# Patient Record
Sex: Female | Born: 1985 | Race: White | Hispanic: No | Marital: Married | Smoking: Current every day smoker
Health system: Southern US, Community
[De-identification: ages and names within clinical notes are randomized; demographics above are authoritative.]

## PROBLEM LIST (undated history)

## (undated) DIAGNOSIS — J45909 Unspecified asthma, uncomplicated: Secondary | ICD-10-CM

## (undated) DIAGNOSIS — G709 Myoneural disorder, unspecified: Secondary | ICD-10-CM

## (undated) DIAGNOSIS — F419 Anxiety disorder, unspecified: Secondary | ICD-10-CM

## (undated) DIAGNOSIS — R112 Nausea with vomiting, unspecified: Secondary | ICD-10-CM

## (undated) DIAGNOSIS — F319 Bipolar disorder, unspecified: Secondary | ICD-10-CM

## (undated) DIAGNOSIS — I10 Essential (primary) hypertension: Secondary | ICD-10-CM

## (undated) DIAGNOSIS — F32A Depression, unspecified: Secondary | ICD-10-CM

## (undated) DIAGNOSIS — M1990 Unspecified osteoarthritis, unspecified site: Secondary | ICD-10-CM

## (undated) DIAGNOSIS — R519 Headache, unspecified: Secondary | ICD-10-CM

## (undated) DIAGNOSIS — Z9889 Other specified postprocedural states: Secondary | ICD-10-CM

## (undated) DIAGNOSIS — R7303 Prediabetes: Secondary | ICD-10-CM

## (undated) DIAGNOSIS — K219 Gastro-esophageal reflux disease without esophagitis: Secondary | ICD-10-CM

## (undated) DIAGNOSIS — R0600 Dyspnea, unspecified: Secondary | ICD-10-CM

## (undated) DIAGNOSIS — F329 Major depressive disorder, single episode, unspecified: Secondary | ICD-10-CM

## (undated) HISTORY — PX: CHOLECYSTECTOMY: SHX55

---
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
## 2006-01-18 ENCOUNTER — Ambulatory Visit (HOSPITAL_COMMUNITY): Admission: AD | Admit: 2006-01-18 | Discharge: 2006-01-18 | Payer: Self-pay | Admitting: Obstetrics and Gynecology

## 2006-03-28 ENCOUNTER — Emergency Department (HOSPITAL_COMMUNITY): Admission: EM | Admit: 2006-03-28 | Discharge: 2006-03-29 | Payer: Self-pay | Admitting: Emergency Medicine

## 2006-06-22 ENCOUNTER — Emergency Department (HOSPITAL_COMMUNITY): Admission: EM | Admit: 2006-06-22 | Discharge: 2006-06-22 | Payer: Self-pay | Admitting: Emergency Medicine

## 2006-09-05 ENCOUNTER — Emergency Department (HOSPITAL_COMMUNITY): Admission: EM | Admit: 2006-09-05 | Discharge: 2006-09-05 | Payer: Self-pay | Admitting: Emergency Medicine

## 2006-09-05 IMAGING — CR DG ABDOMEN 1V
2 series · 2 of 2 positions shown · non-contrast
Comparison: none

CLINICAL DATA: 20-year-old with constipation, abdominal pain.  
 ABDOMEN - 1 VIEW:

[view not recorded (1 of 2)]
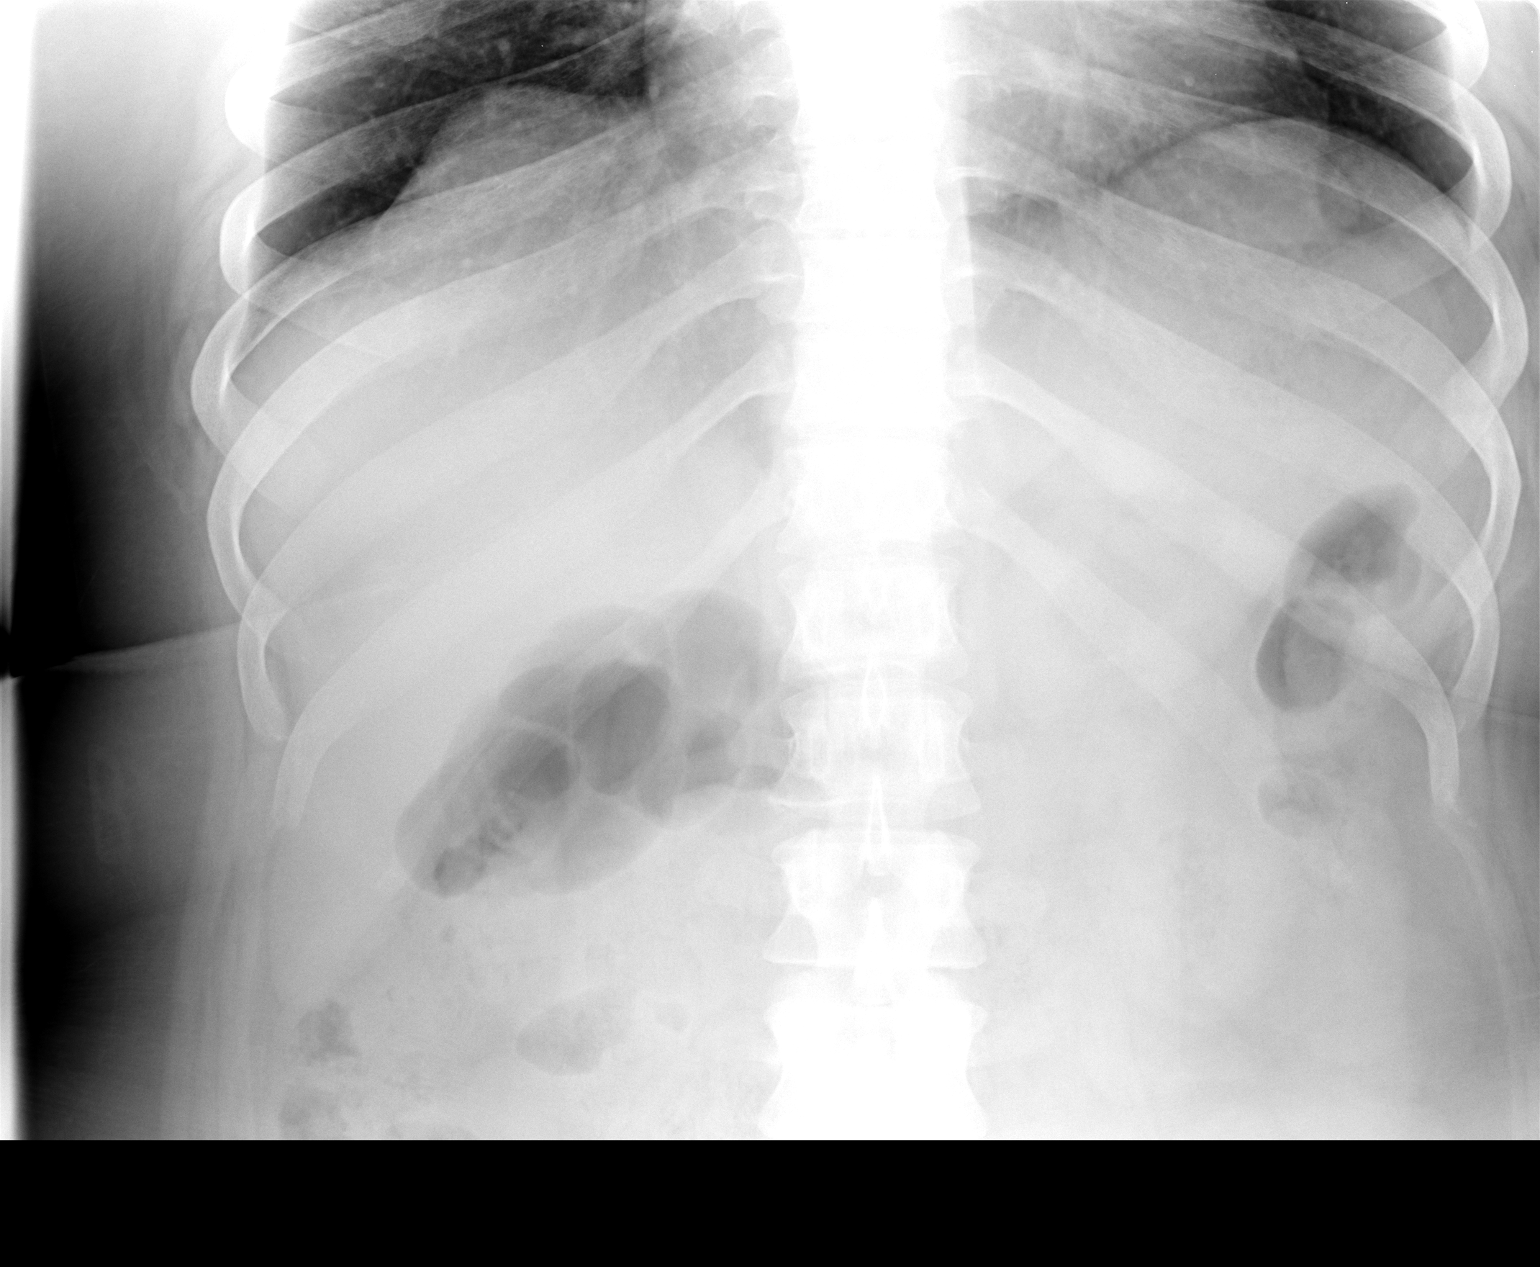

[view not recorded (2 of 2)]
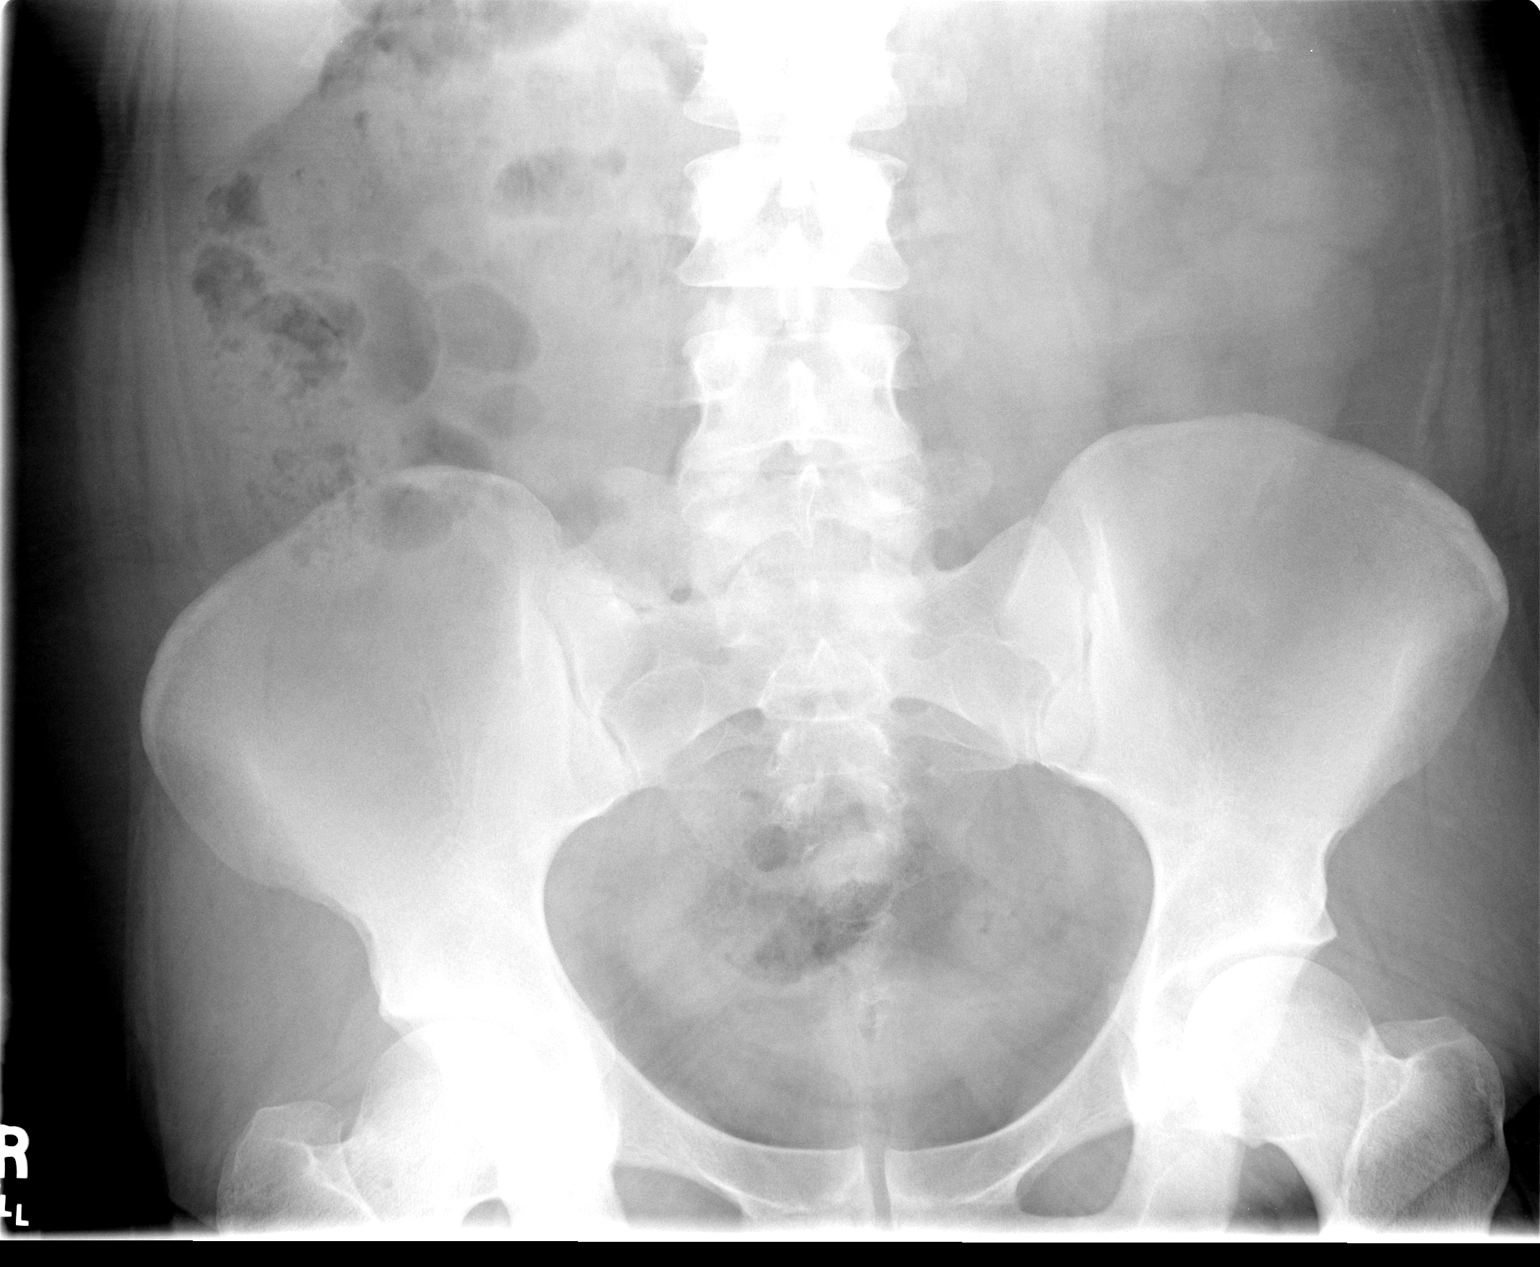

[2 of 2 positions shown; findings below may reference images not displayed]

FINDINGS: There is moderate stool within the ascending colon.  No evidence for bowel loop dilatation however.  No free intraperitoneal air is seen on this view.  Position is supine however.
IMPRESSION: Moderate stool within the ascending colon.

## 2007-02-13 ENCOUNTER — Emergency Department (HOSPITAL_COMMUNITY): Admission: EM | Admit: 2007-02-13 | Discharge: 2007-02-13 | Payer: Self-pay | Admitting: Emergency Medicine

## 2007-09-01 ENCOUNTER — Other Ambulatory Visit: Admission: RE | Admit: 2007-09-01 | Discharge: 2007-09-01 | Payer: Self-pay | Admitting: Obstetrics and Gynecology

## 2007-09-20 ENCOUNTER — Ambulatory Visit (HOSPITAL_COMMUNITY): Admission: RE | Admit: 2007-09-20 | Discharge: 2007-09-20 | Payer: Self-pay | Admitting: Family Medicine

## 2007-09-20 IMAGING — CR DG LUMBAR SPINE COMPLETE 4+V
5 series · 5 of 5 positions shown · non-contrast
Comparison: none

CLINICAL DATA: Low back and right hip pain for two months. 
 DIAGNOSTIC LUMBAR SPINE ? 4 VIEWS:
 No comparison.

[view not recorded (1 of 5)]
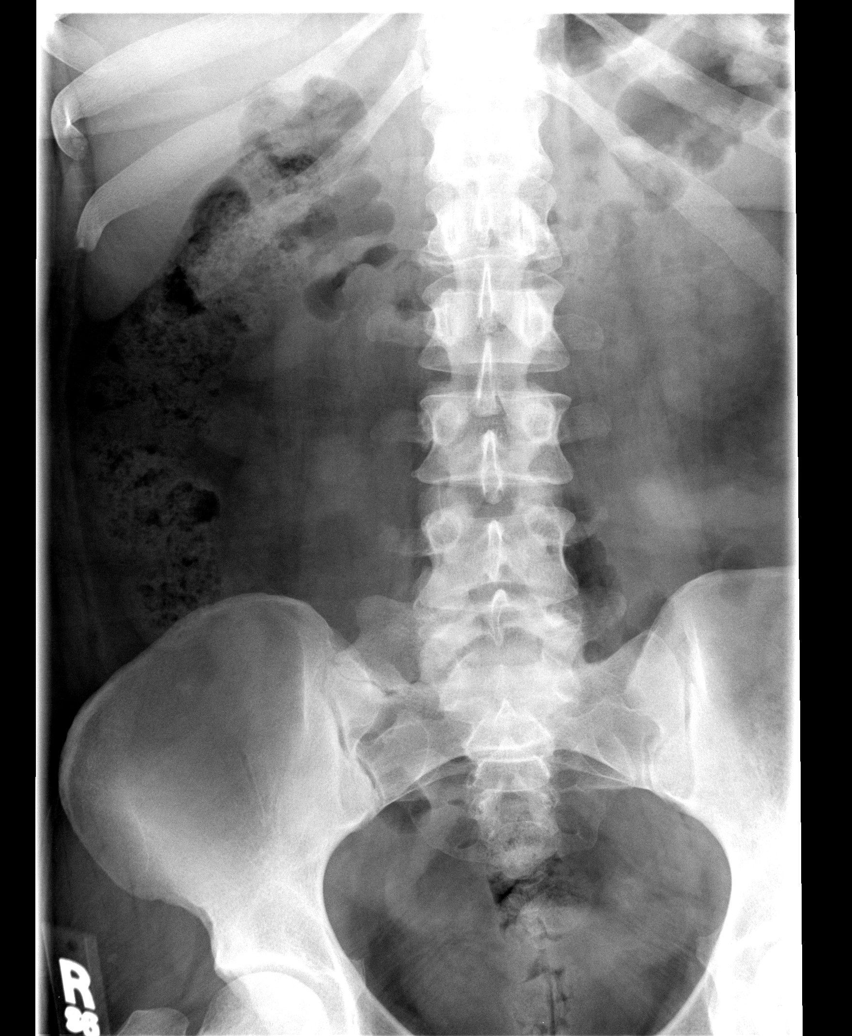

[view not recorded (2 of 5)]
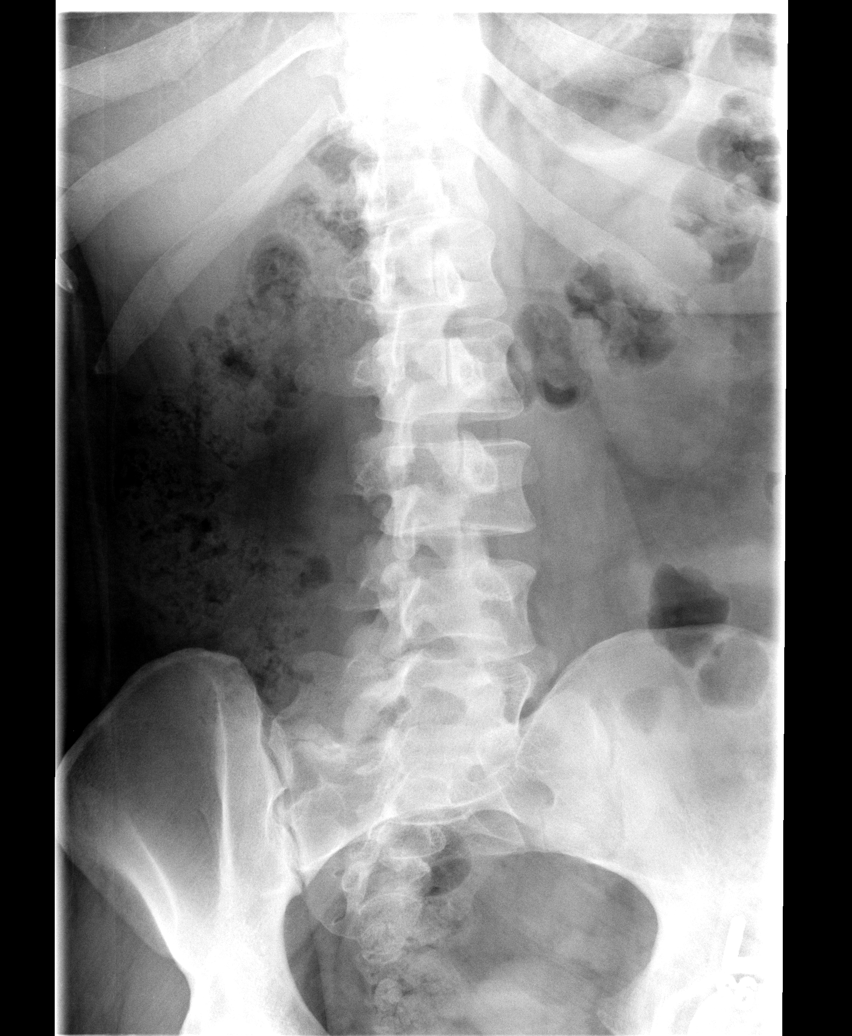

[view not recorded (3 of 5)]
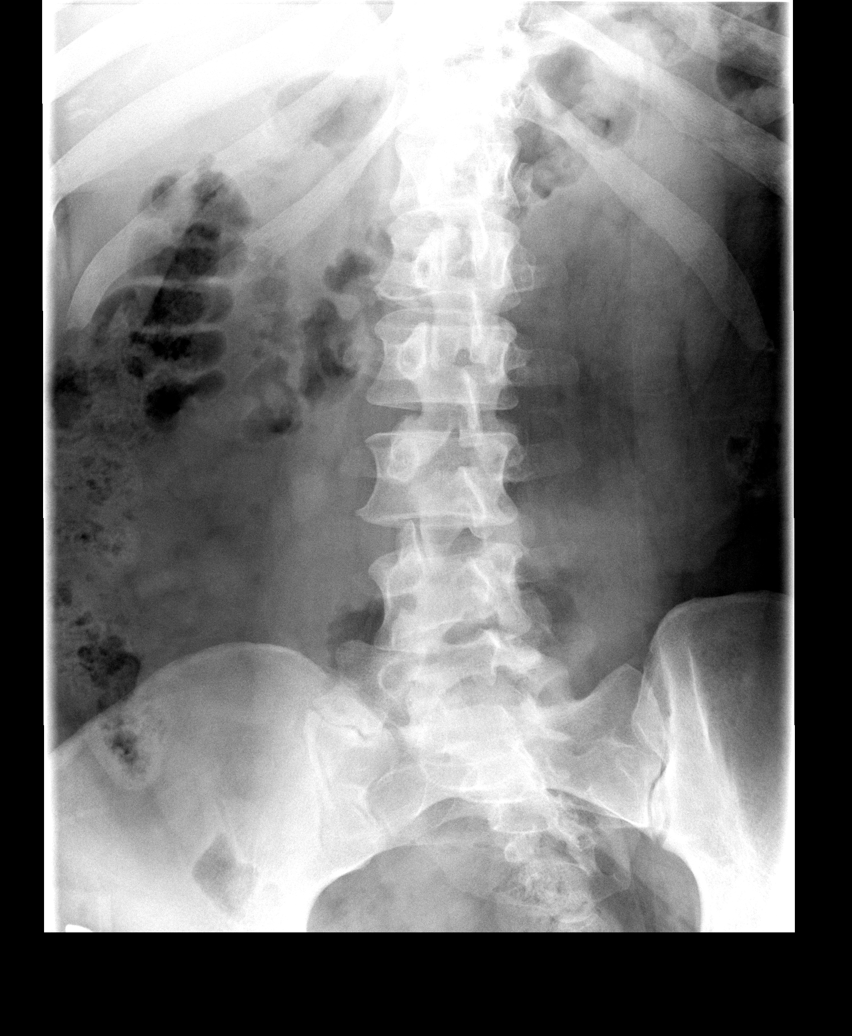

[view not recorded (4 of 5)]
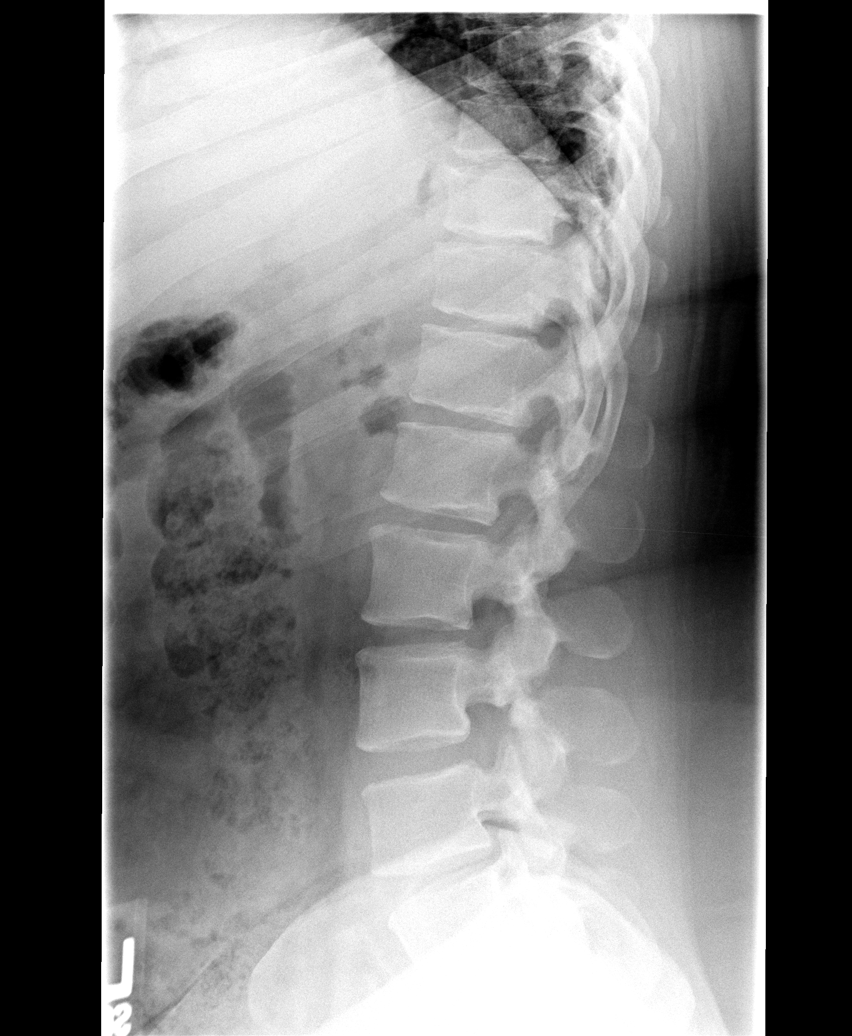

[view not recorded (5 of 5)]
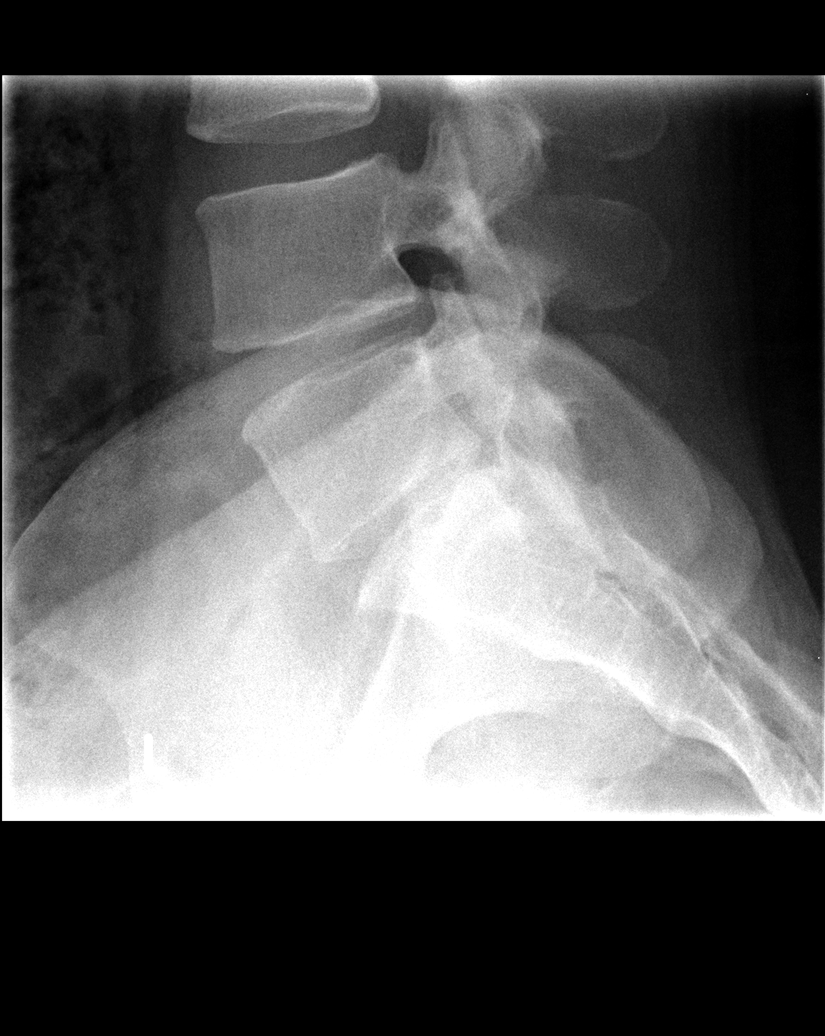

[5 of 5 positions shown; findings below may reference images not displayed]

FINDINGS: There is transitional lumbosacral anatomy. The S1 segment is partially lumbarized with a right-sided S1-S2 assimilation joint.  The alignment is normal.  The disk spaces are preserved.  No pars defect or significant facet disease is seen.
IMPRESSION: 1.  Transitional lumbosacral anatomy with right-sided sacral assimilation joint. This may contribute to right-sided back pain.
 2.  Normal alignment.  No acute osseous findings.

## 2008-05-09 ENCOUNTER — Ambulatory Visit (HOSPITAL_COMMUNITY): Admission: RE | Admit: 2008-05-09 | Discharge: 2008-05-09 | Payer: Self-pay | Admitting: Obstetrics & Gynecology

## 2008-05-09 IMAGING — US US OB DETAIL+14 WK
1 series · 14 of 28 positions shown · non-contrast
Comparison: none

OBSTETRICAL ULTRASOUND:
 This ultrasound was performed in The [HOSPITAL], and the [facility] report will be stored to [REDACTED] PACS.

[Series 1: us ob detail+14 wk · 14 of 136 slices shown]
[im 6/136]
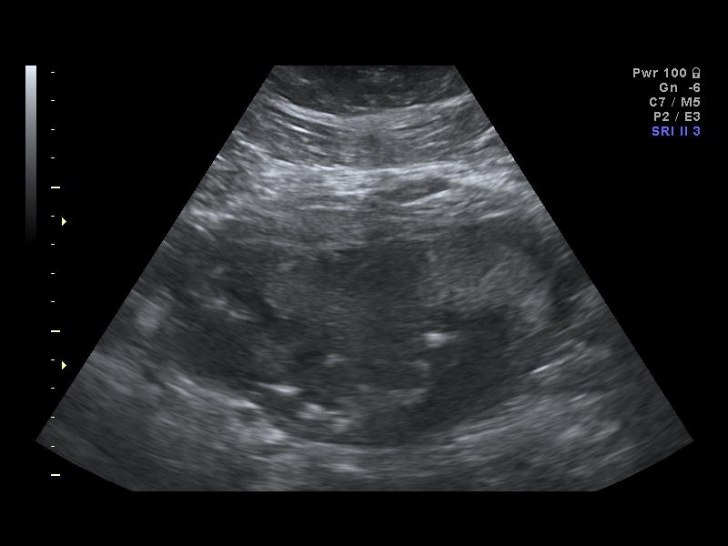
[im 16/136]
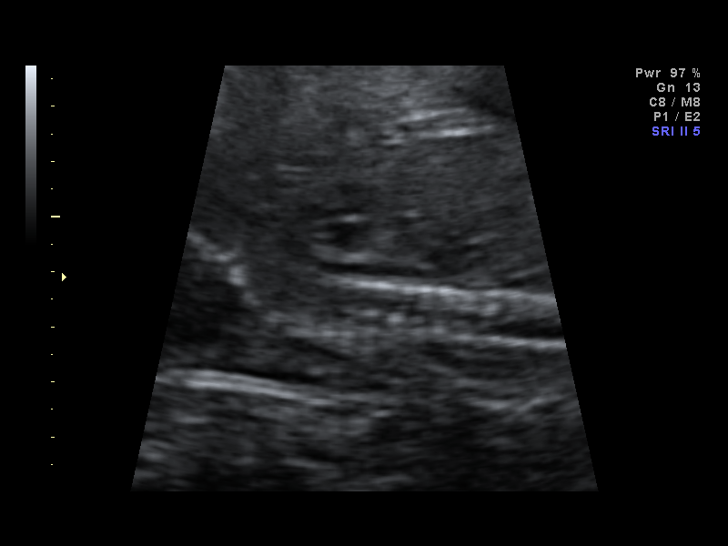
[im 26/136]
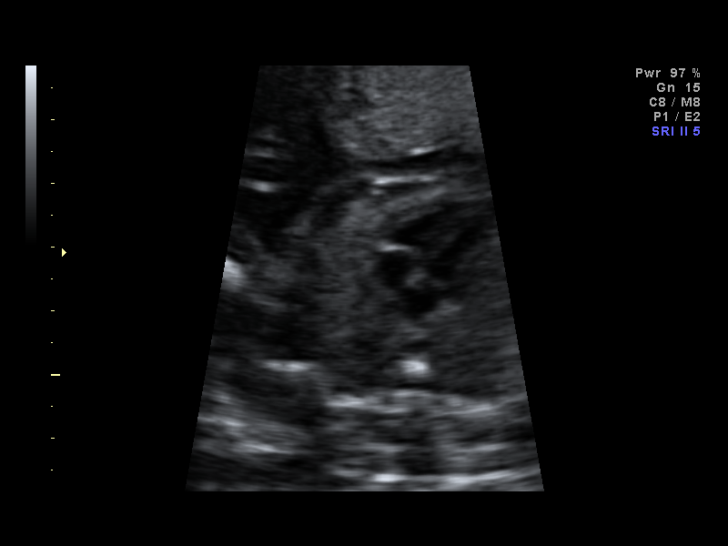
[im 36/136]
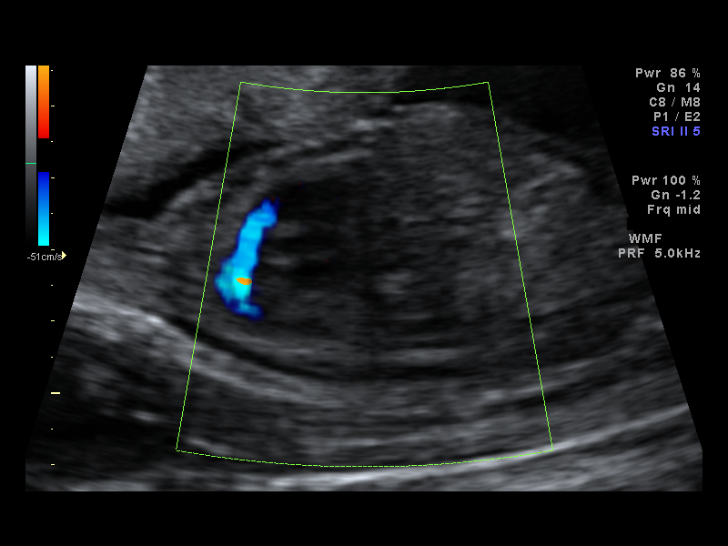
[im 46/136]
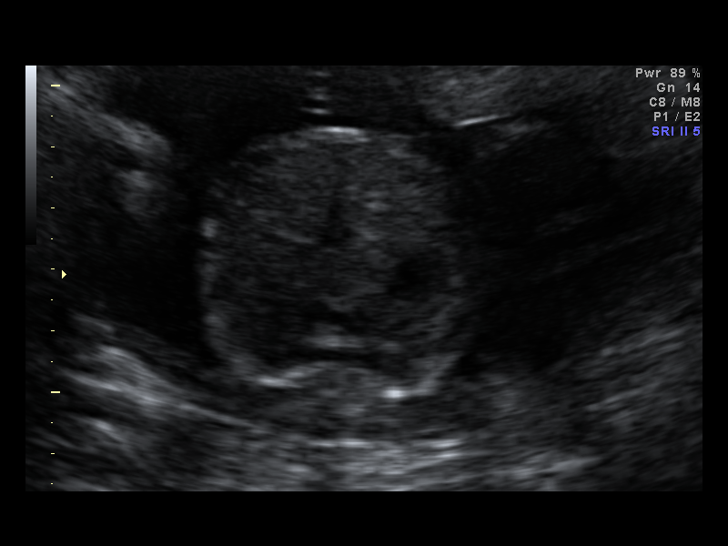
[im 56/136]
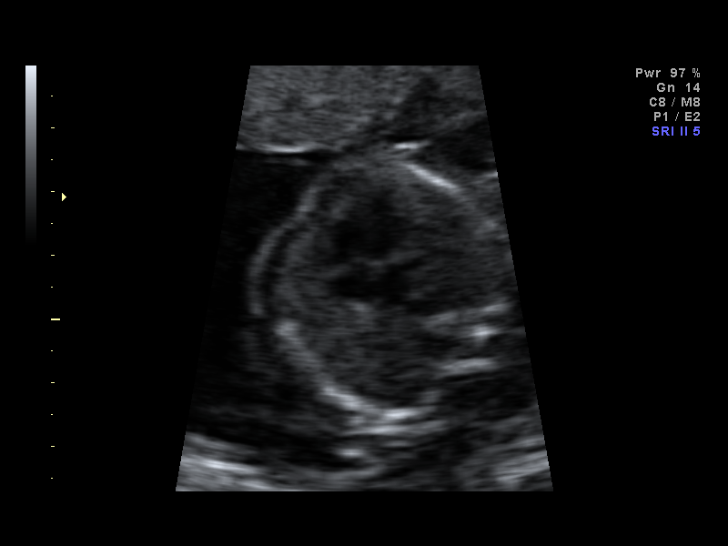
[im 66/136]
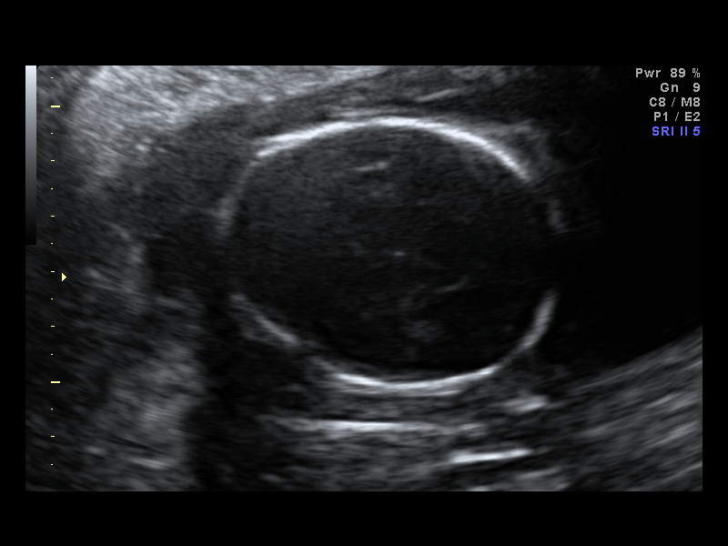
[im 76/136]
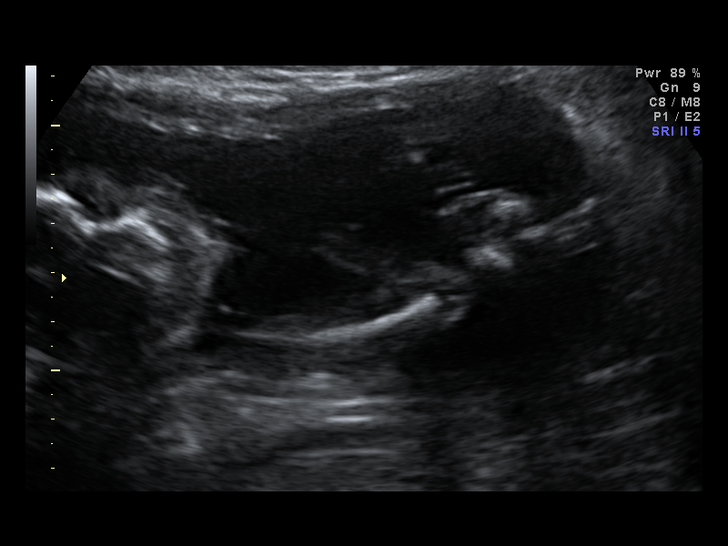
[im 86/136]
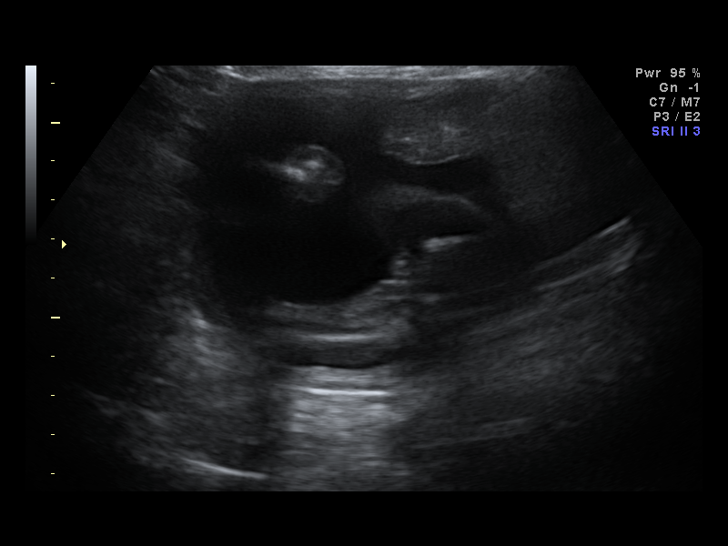
[im 96/136]
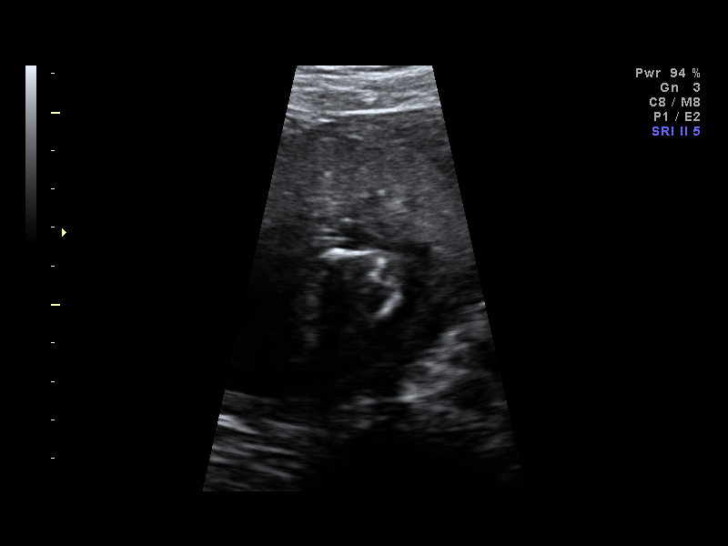
[im 106/136]
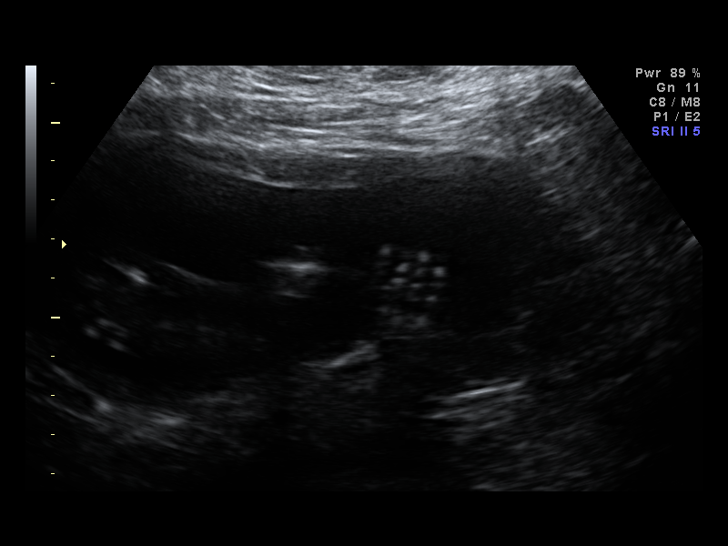
[im 116/136]
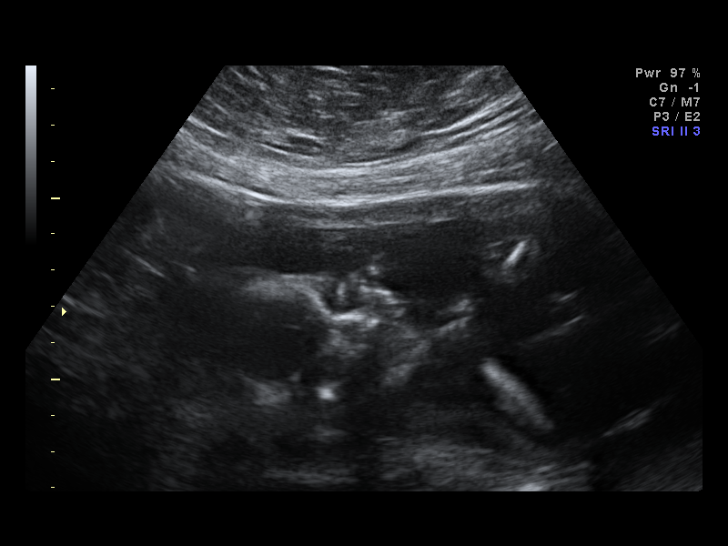
[im 126/136]
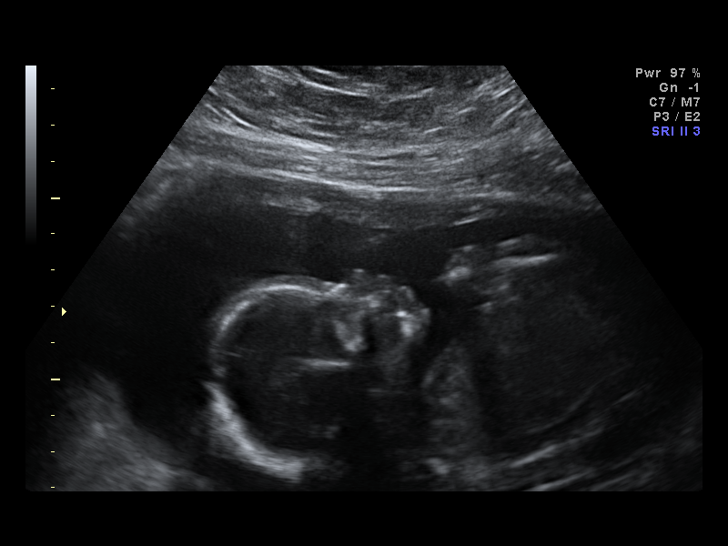
[im 136/136]
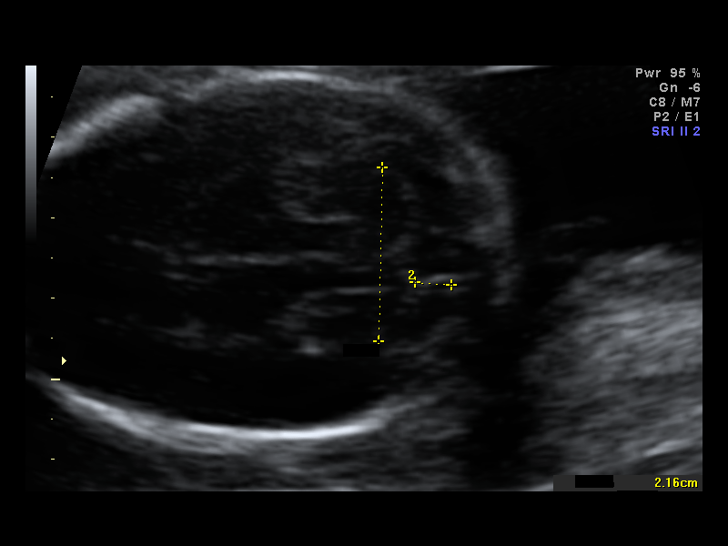

[14 of 28 positions shown; findings below may reference images not displayed]

IMPRESSION: [facility] has also been faxed to the ordering physician.

## 2008-06-13 ENCOUNTER — Ambulatory Visit (HOSPITAL_COMMUNITY): Admission: RE | Admit: 2008-06-13 | Discharge: 2008-06-13 | Payer: Self-pay | Admitting: Obstetrics & Gynecology

## 2008-06-13 IMAGING — US US OB FOLLOW-UP
1 series · 14 of 28 positions shown · non-contrast
Comparison: none

OBSTETRICAL ULTRASOUND:
 This ultrasound was performed in The [HOSPITAL], and the [facility] report will be stored to [REDACTED] PACS.

[Series 1: us ob follow-up · 0.40mm/px · 14 of 91 slices shown]
[im 4/91]
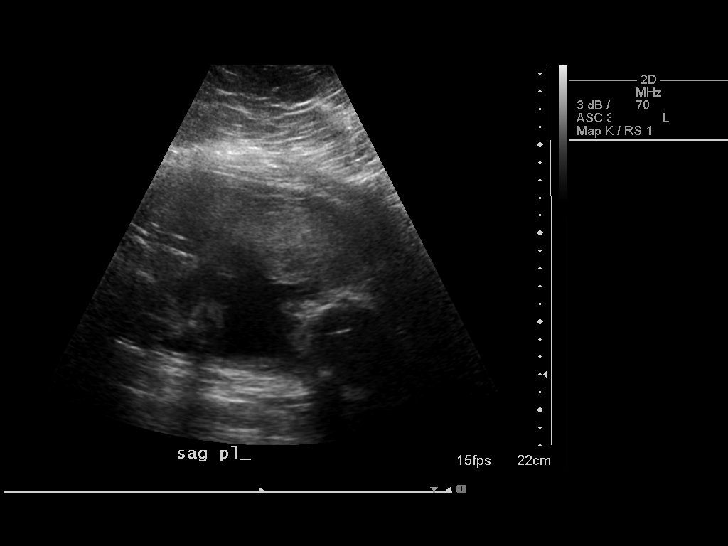
[im 11/91]
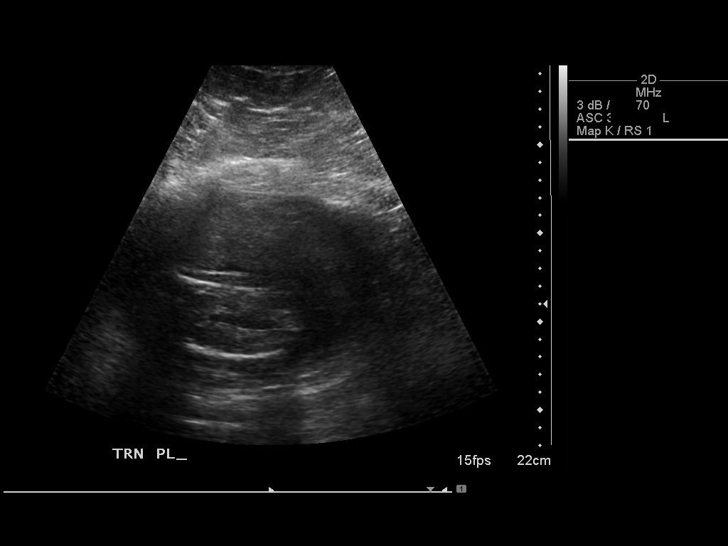
[im 17/91]
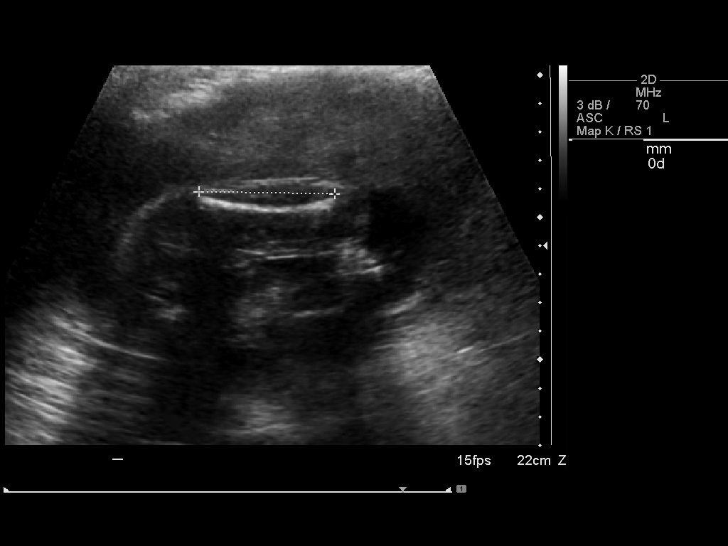
[im 24/91]
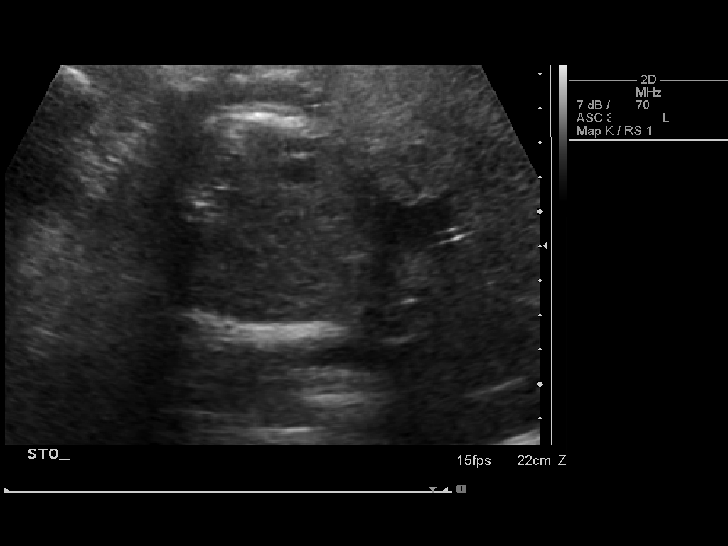
[im 31/91]
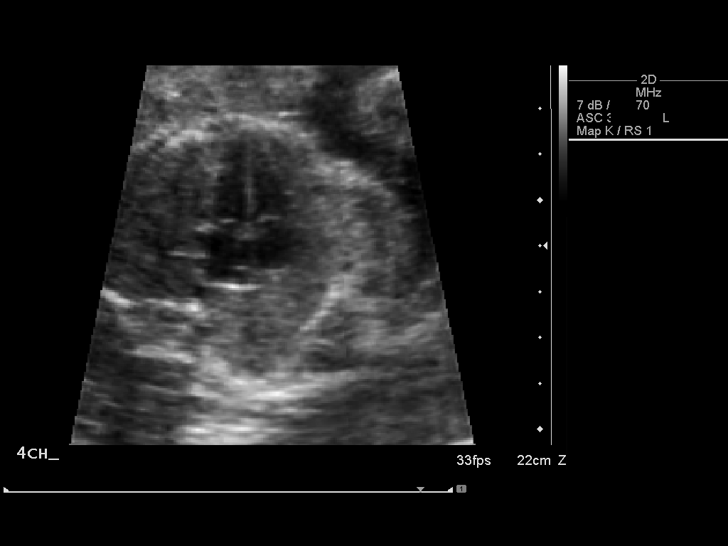
[im 37/91]
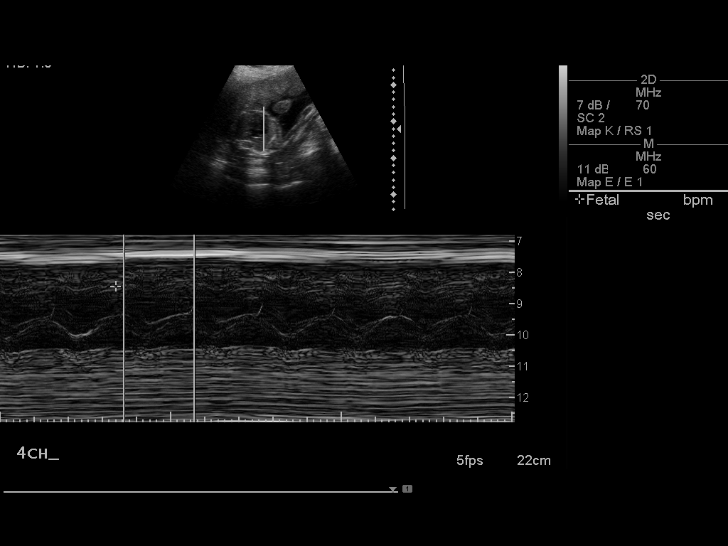
[im 44/91]
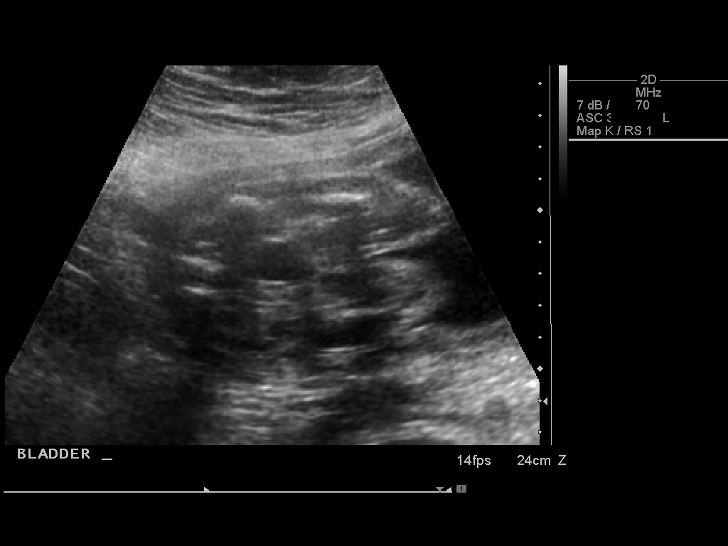
[im 51/91]
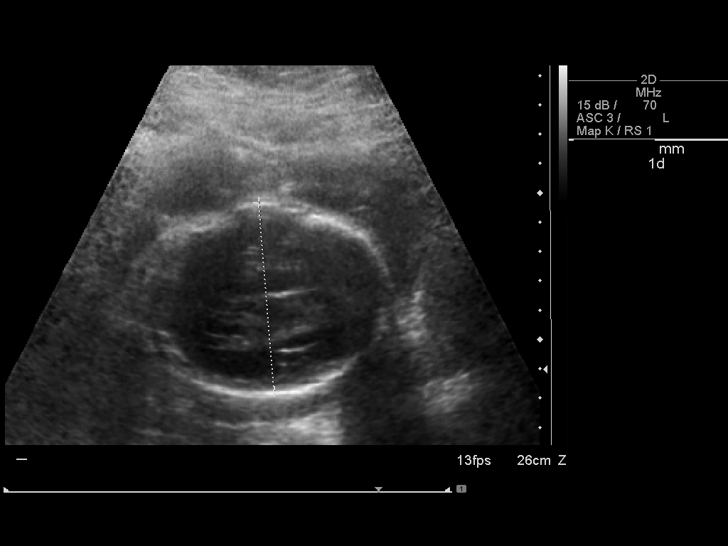
[im 57/91]
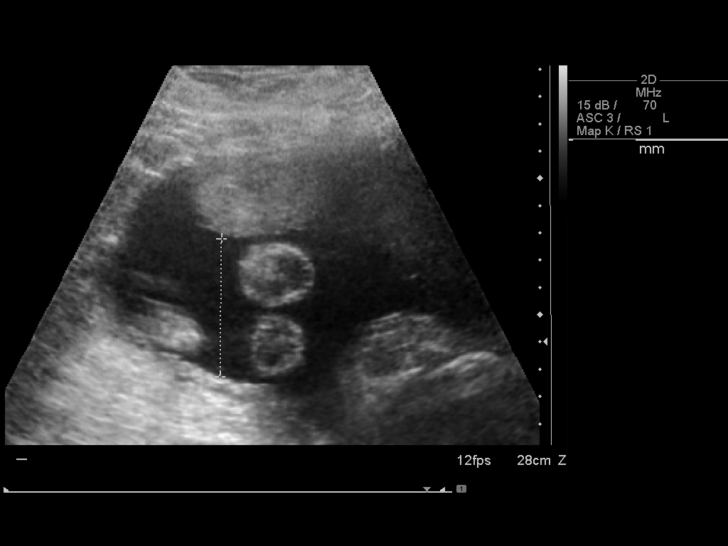
[im 64/91]
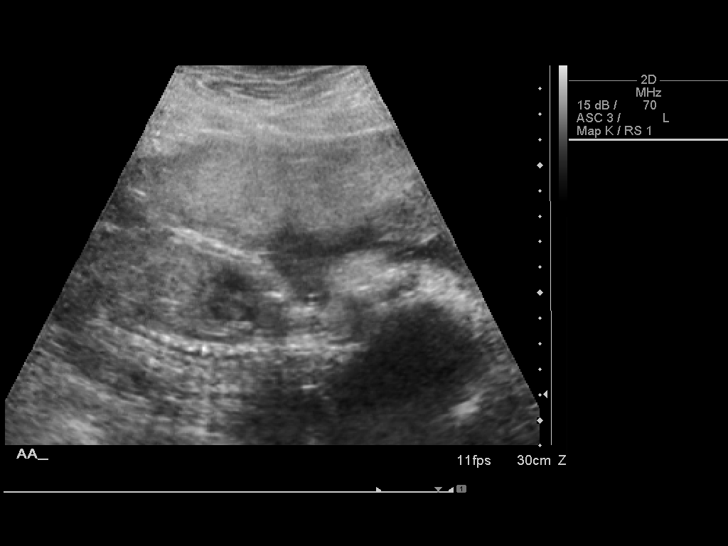
[im 71/91]
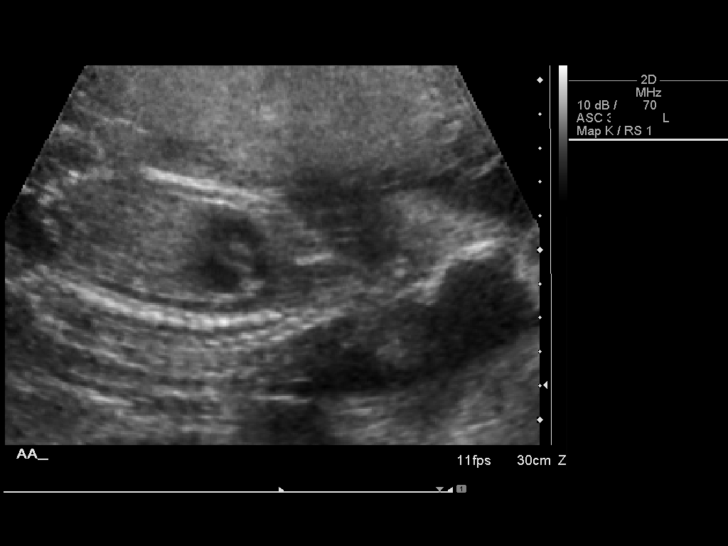
[im 77/91]
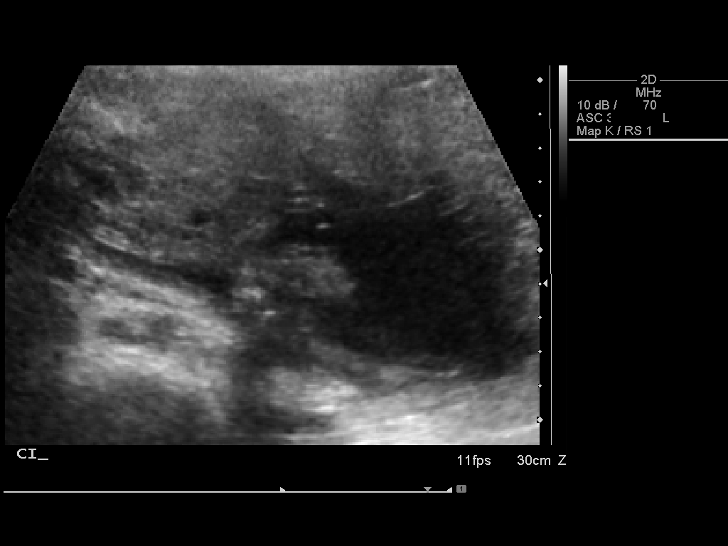
[im 84/91]
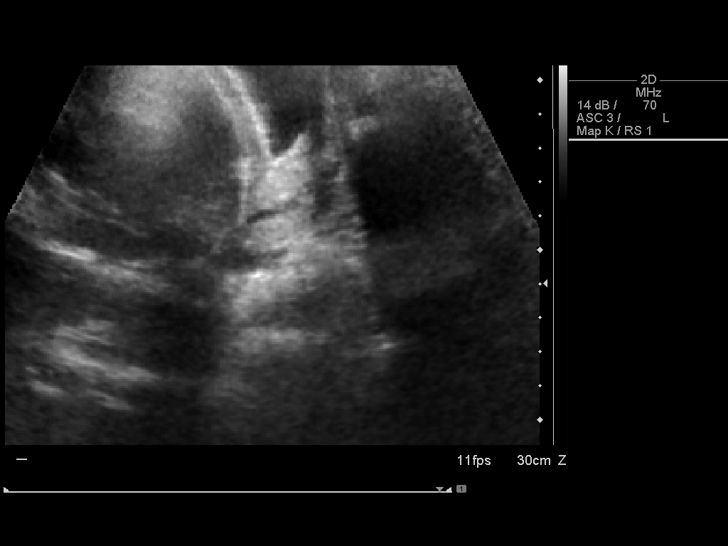
[im 91/91]
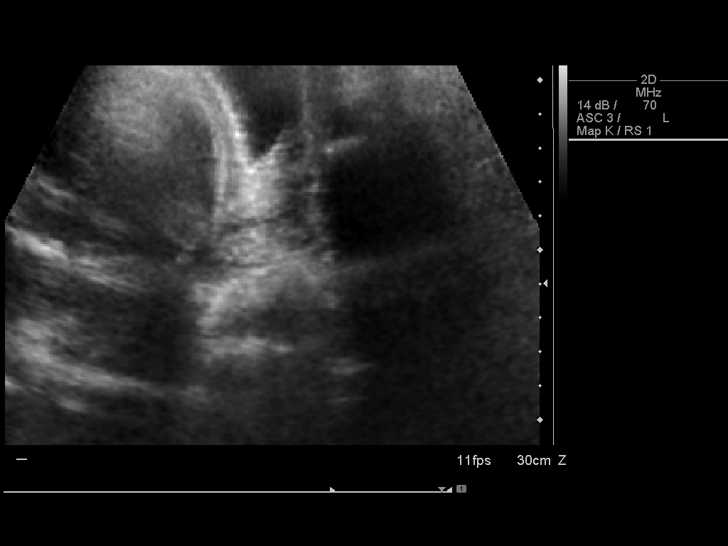

[14 of 28 positions shown; findings below may reference images not displayed]

IMPRESSION: [facility] has also been faxed to the ordering physician.

## 2008-06-13 IMAGING — US US OB FOLLOW-UP
1 series · 6 of 6 positions shown · non-contrast
Comparison: none

OBSTETRICAL ULTRASOUND:
 This ultrasound was performed in The [HOSPITAL], and the [facility] report will be stored to [REDACTED] PACS.

[Series 1: us ob follow-up · 6 of 6 slices shown]
[im 1/6]
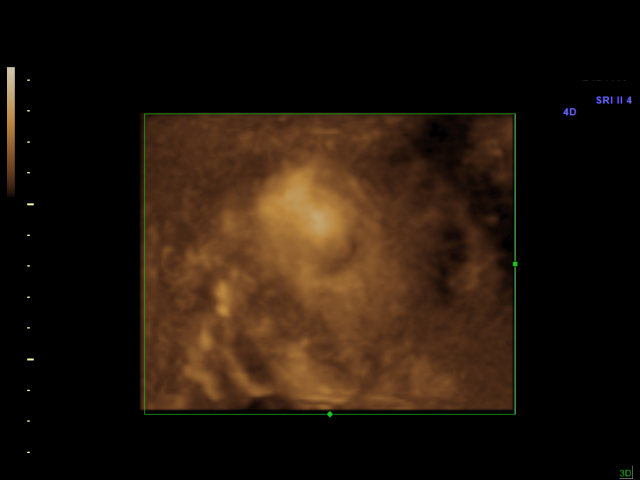
[im 2/6]
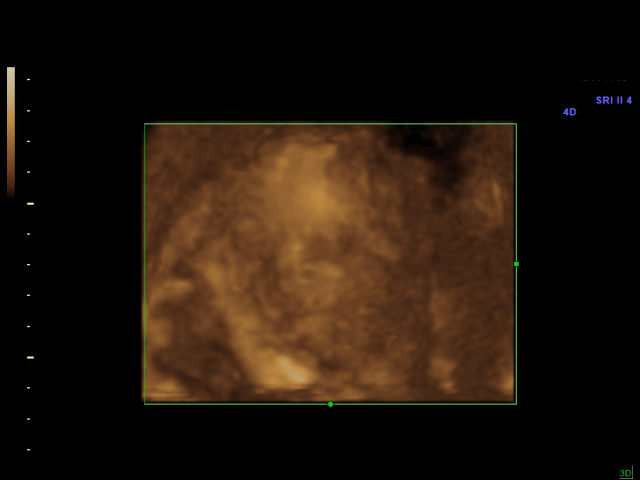
[im 3/6]
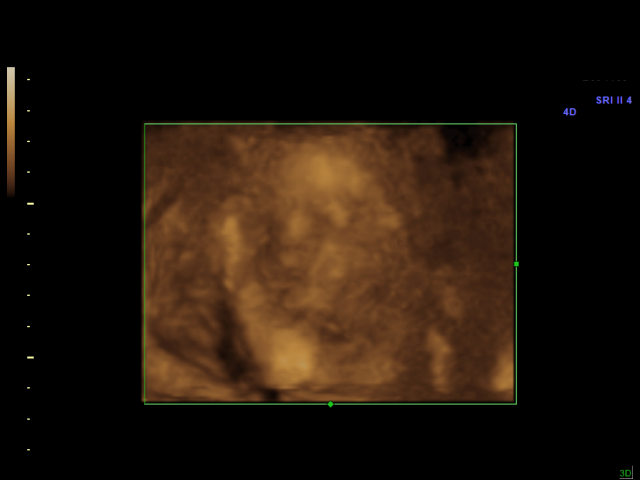
[im 4/6]
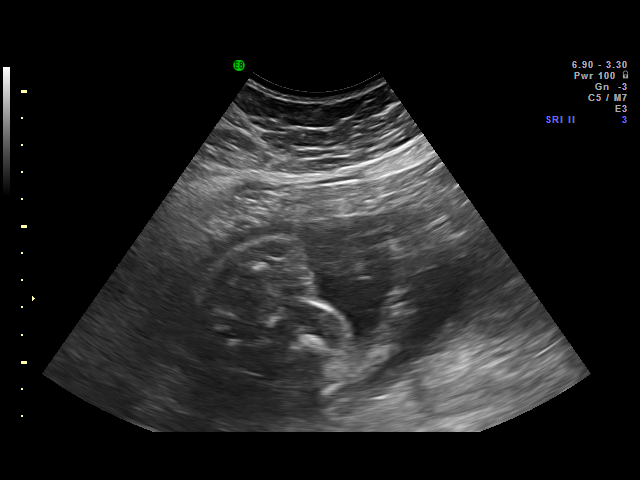
[im 5/6]
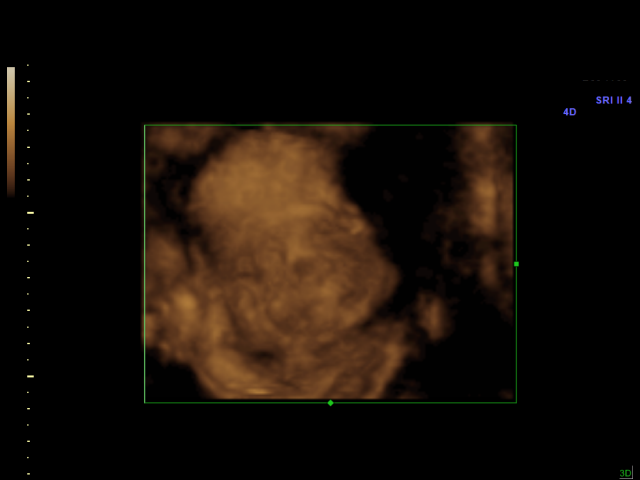
[im 6/6]
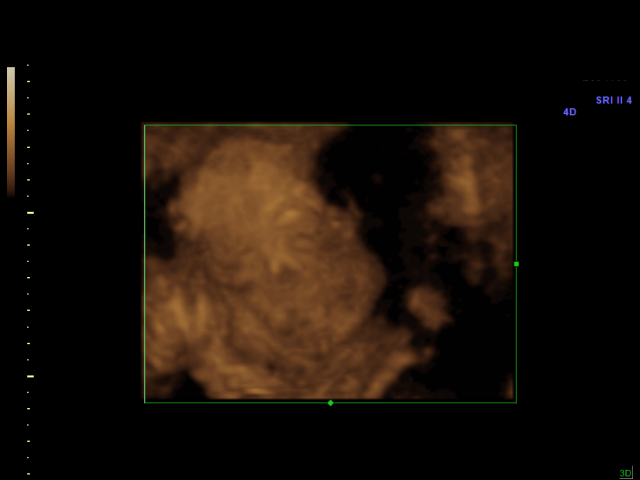

[6 of 6 positions shown; findings below may reference images not displayed]

IMPRESSION: [facility] has also been faxed to the ordering physician.

## 2008-07-11 ENCOUNTER — Ambulatory Visit (HOSPITAL_COMMUNITY): Admission: RE | Admit: 2008-07-11 | Discharge: 2008-07-11 | Payer: Self-pay | Admitting: Obstetrics & Gynecology

## 2008-07-11 IMAGING — US US OB FOLLOW-UP
1 series · 14 of 28 positions shown · non-contrast
Comparison: none

OBSTETRICAL ULTRASOUND:
 This ultrasound was performed in The [HOSPITAL], and the [facility] report will be stored to [REDACTED] PACS.

[Series 1: us ob follow-up · 14 of 32 slices shown]
[im 2/32]
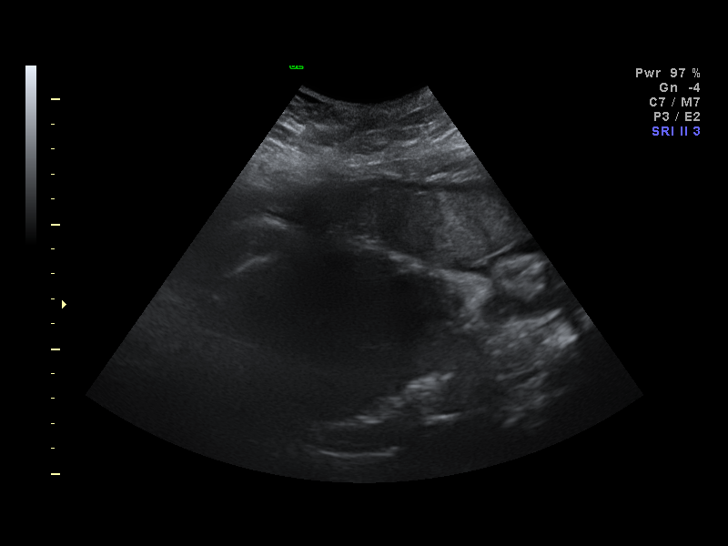
[im 4/32]
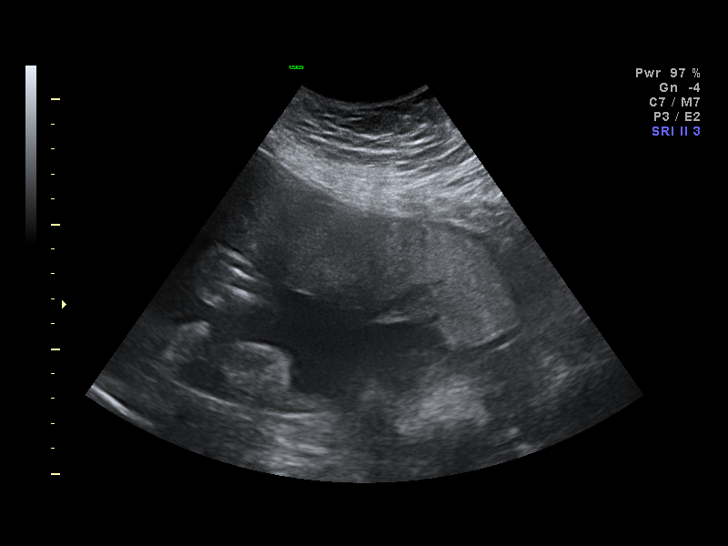
[im 6/32]
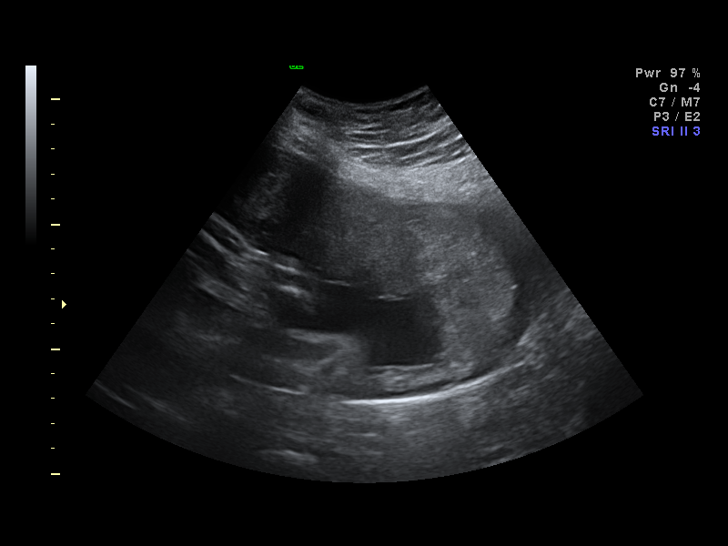
[im 9/32]
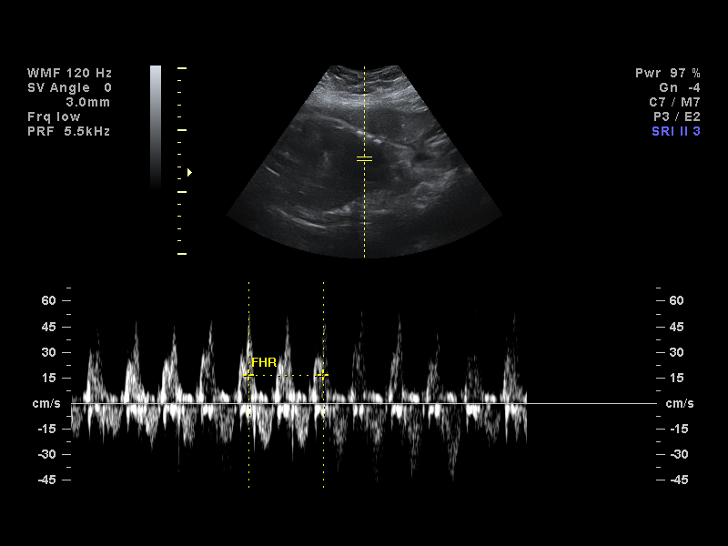
[im 11/32]
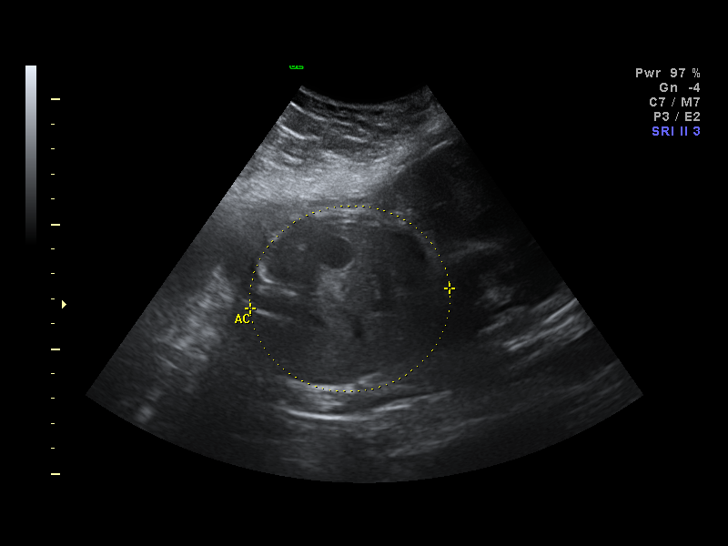
[im 13/32]
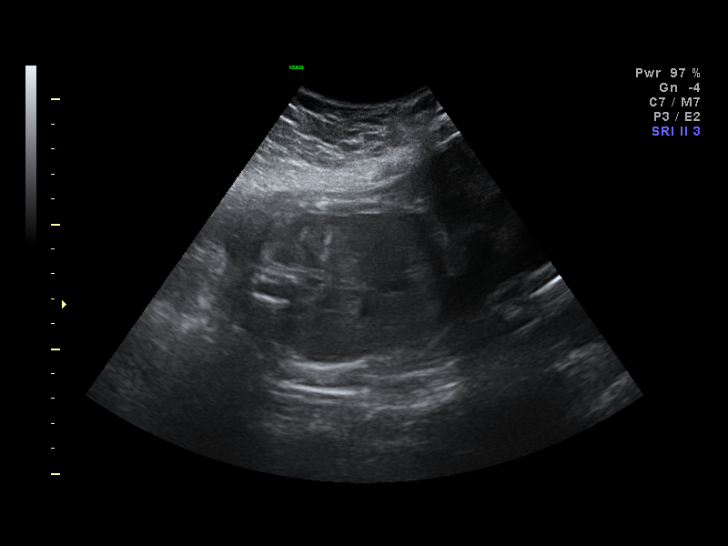
[im 15/32]
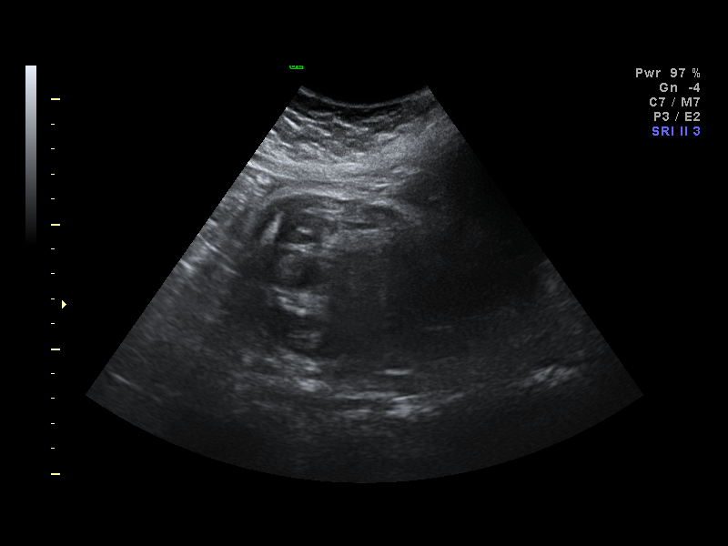
[im 18/32]
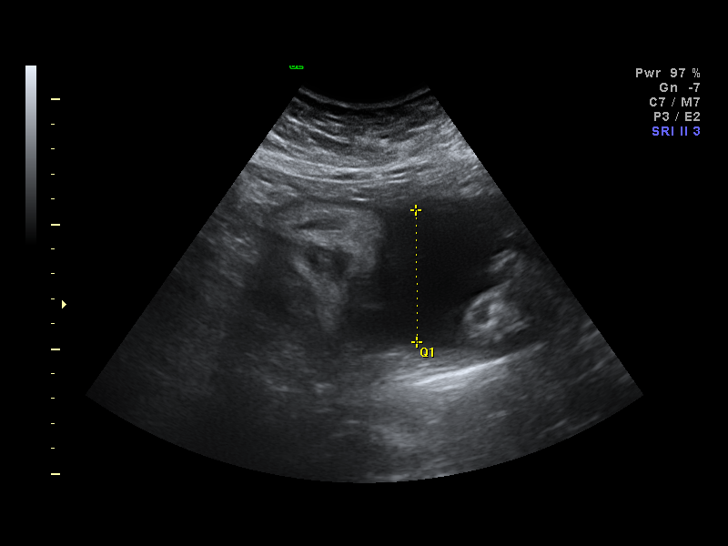
[im 20/32]
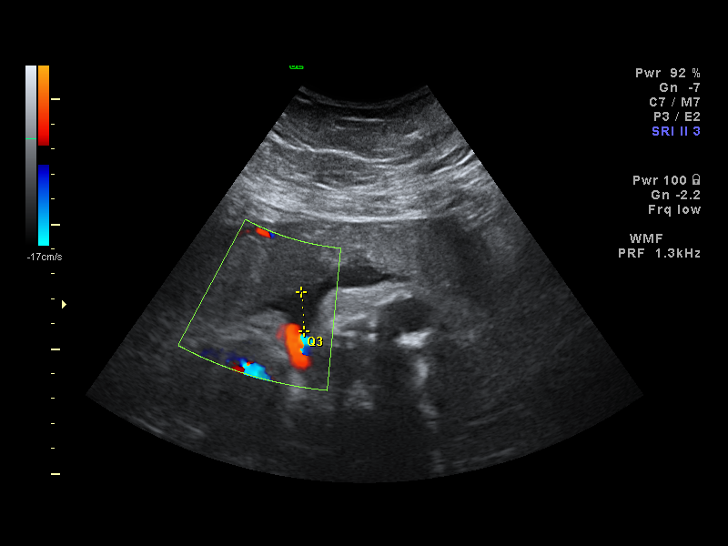
[im 22/32]
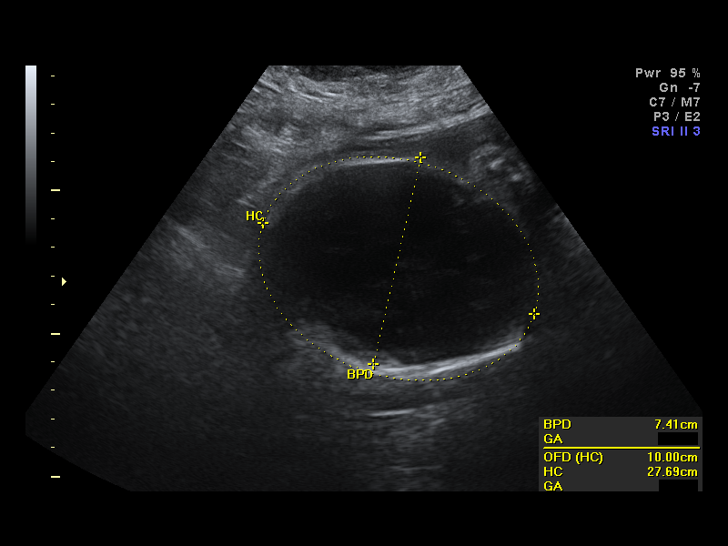
[im 25/32]
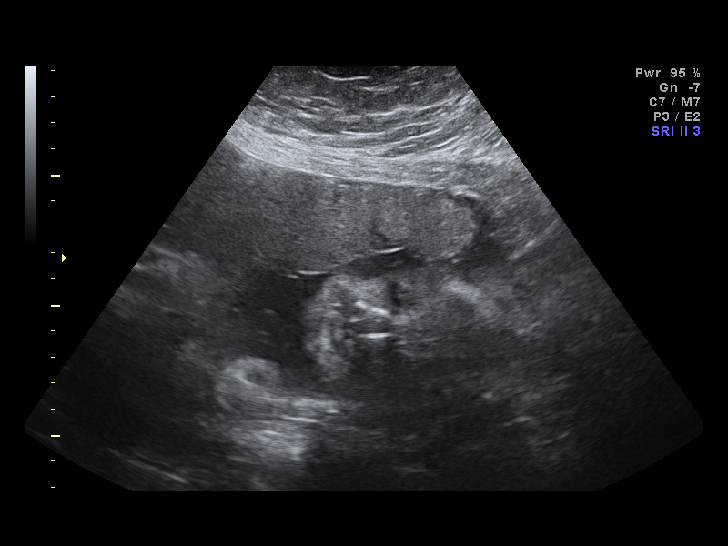
[im 27/32]
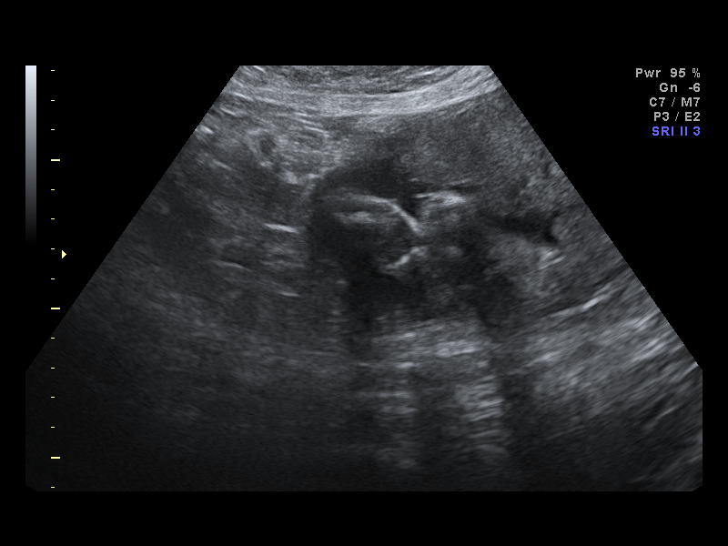
[im 29/32]
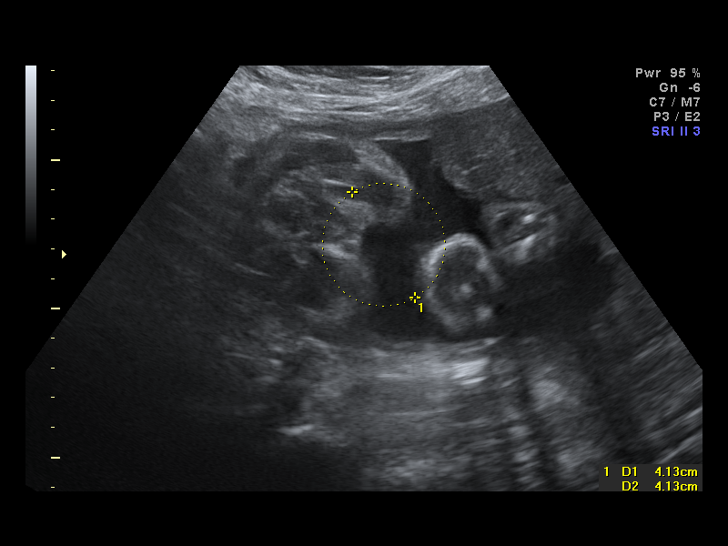
[im 32/32]
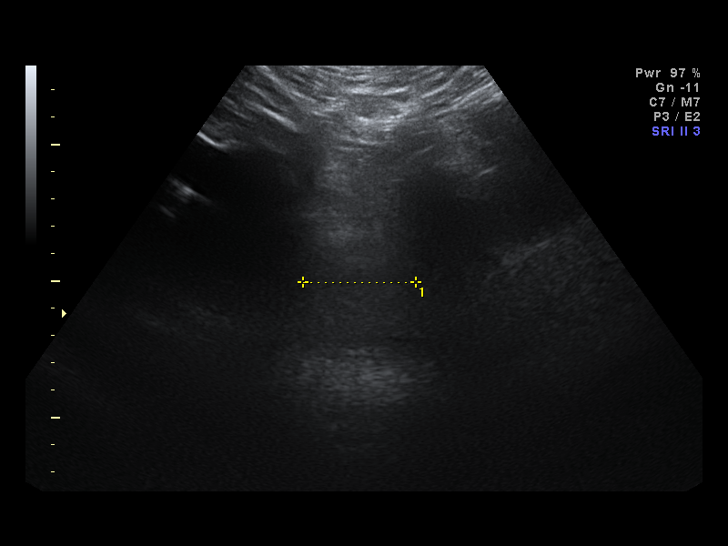

[14 of 28 positions shown; findings below may reference images not displayed]

IMPRESSION: [facility] has also been faxed to the ordering physician.

## 2008-08-15 ENCOUNTER — Ambulatory Visit (HOSPITAL_COMMUNITY): Admission: RE | Admit: 2008-08-15 | Discharge: 2008-08-15 | Payer: Self-pay | Admitting: Obstetrics & Gynecology

## 2008-08-15 IMAGING — US US OB FOLLOW-UP
1 series · 18 of 18 positions shown · non-contrast
Comparison: none

OBSTETRICAL ULTRASOUND:
 This ultrasound was performed in The [HOSPITAL], and the [facility] report will be stored to [REDACTED] PACS.

[Series 1: us ob follow-up · 18 acquisitions, 18 frames shown]
[im 1/18]
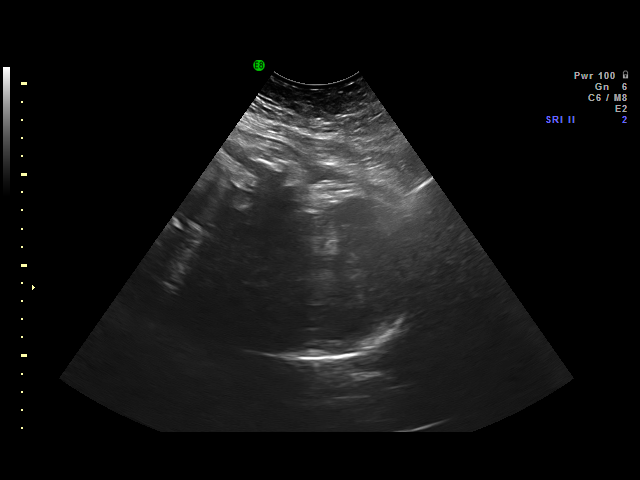
[im 2/18]
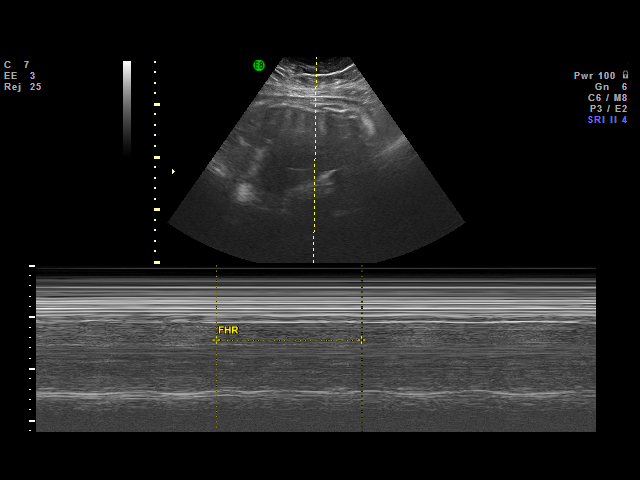
[im 3/18]
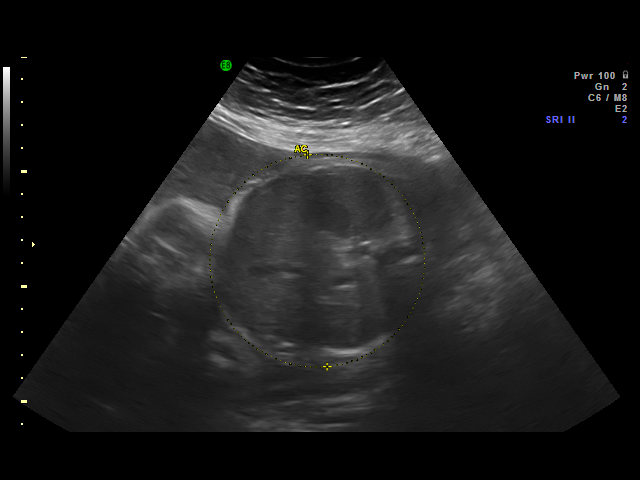
[im 4/18]
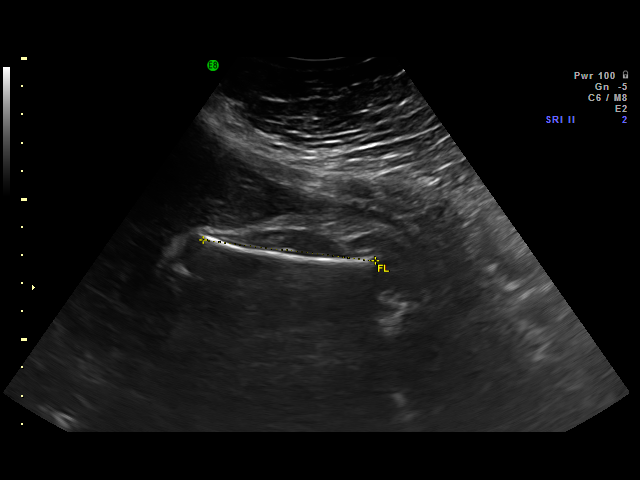
[im 5/18]
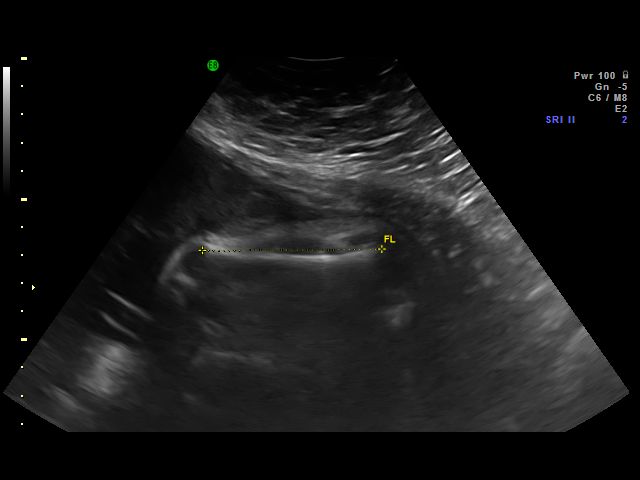
[im 6/18]
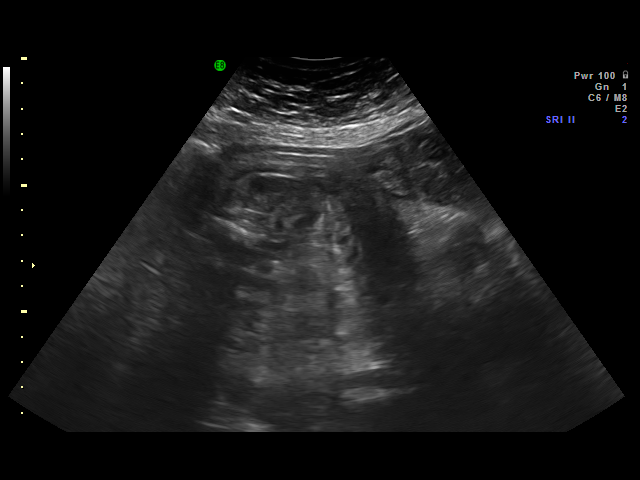
[im 7/18]
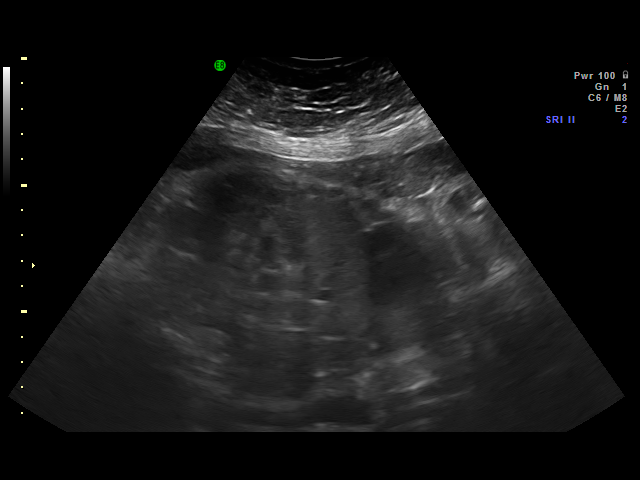
[im 8/18]
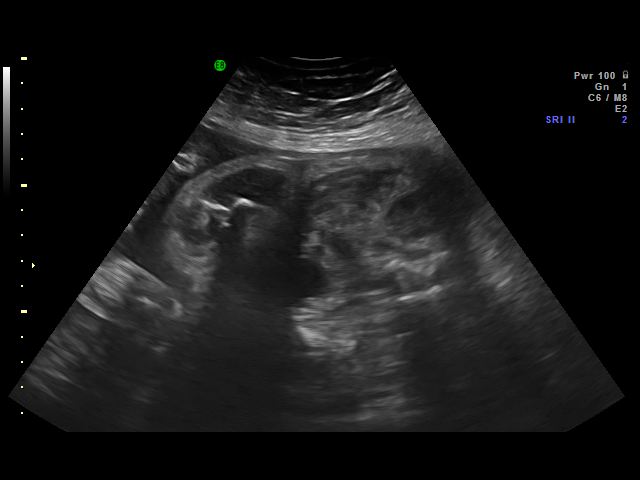
[im 9/18]
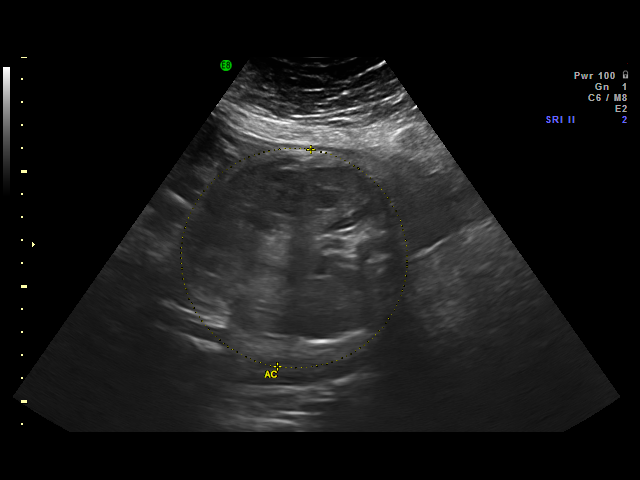
[im 10/18]
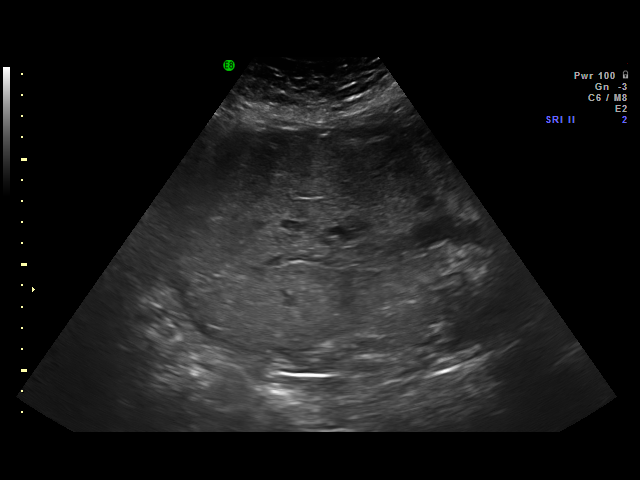
[im 11/18]
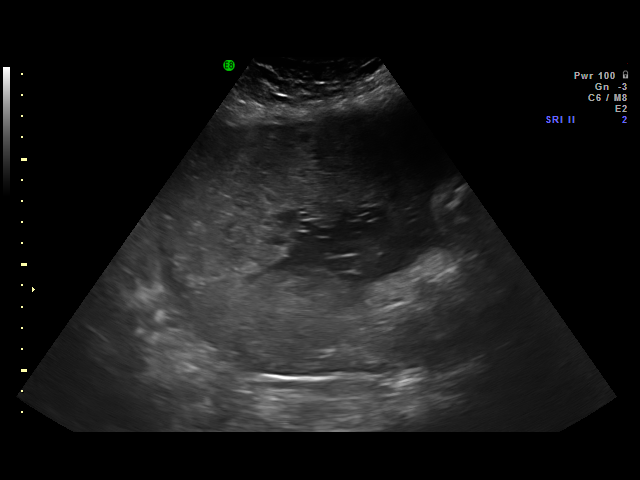
[im 12/18]
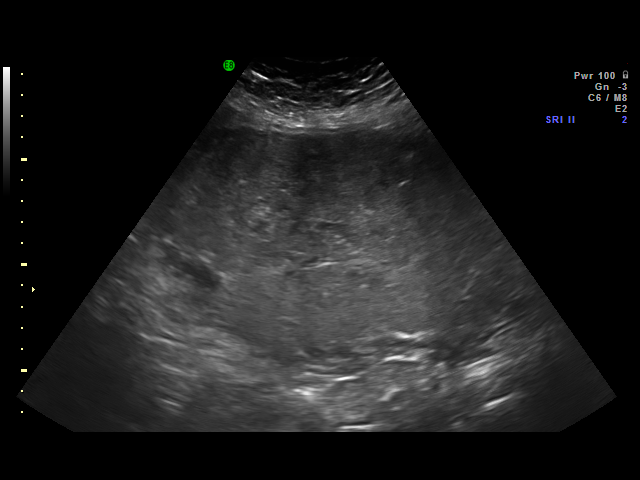
[im 13/18]
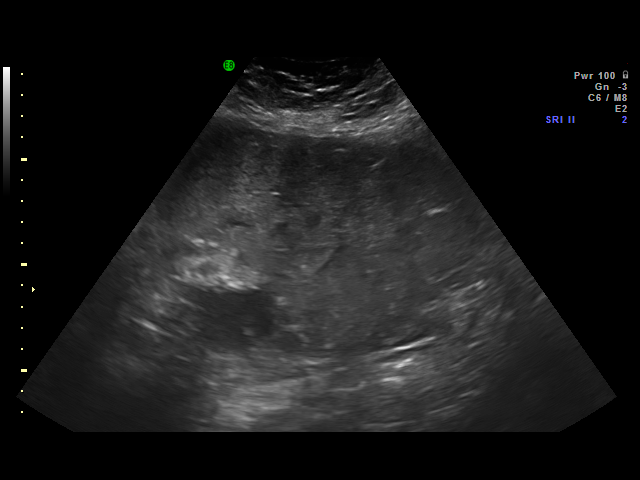
[im 14/18]
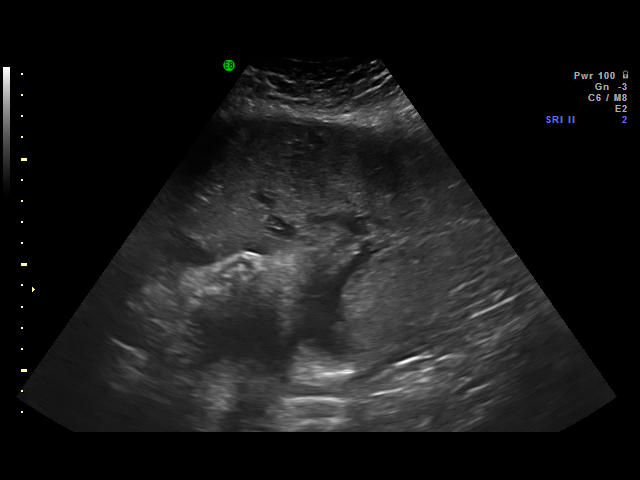
[im 15/18]
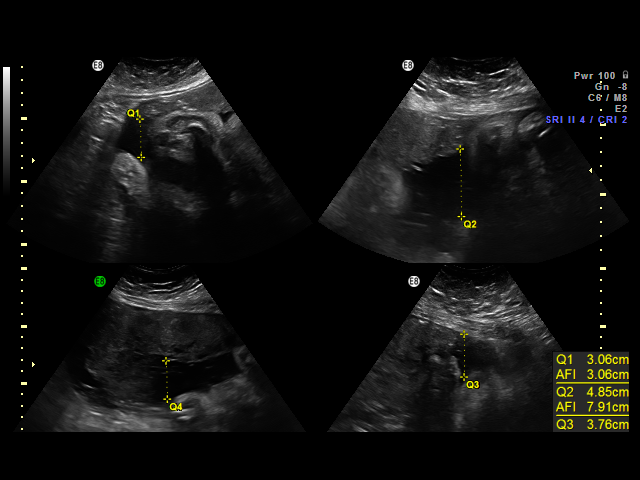
[im 16/18]
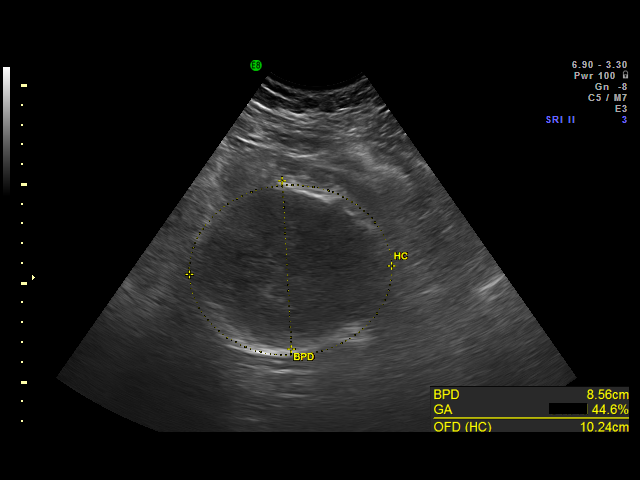
[im 17/18]
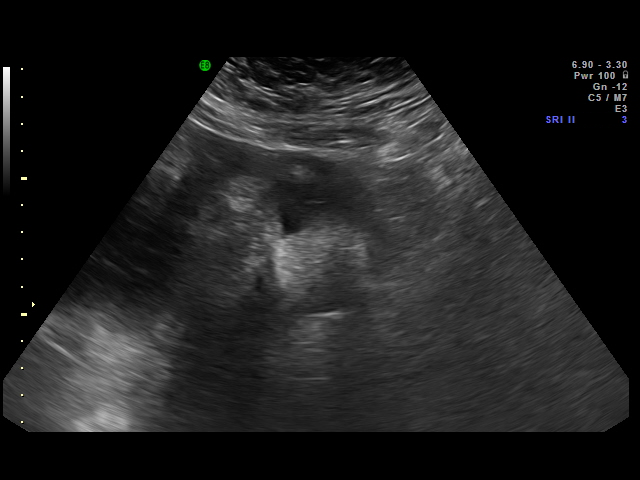
[im 18/18]
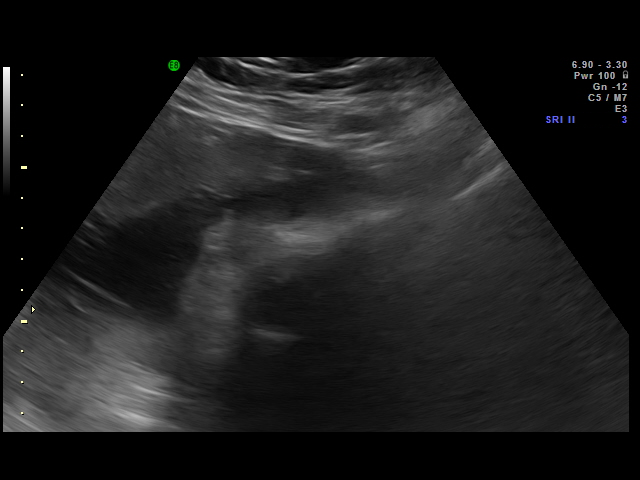

[18 of 18 positions shown; findings below may reference images not displayed]

IMPRESSION: [facility] has also been faxed to the ordering physician.

## 2008-09-03 ENCOUNTER — Inpatient Hospital Stay (HOSPITAL_COMMUNITY): Admission: AD | Admit: 2008-09-03 | Discharge: 2008-09-03 | Payer: Self-pay | Admitting: Obstetrics & Gynecology

## 2008-09-22 ENCOUNTER — Ambulatory Visit: Payer: Self-pay | Admitting: Advanced Practice Midwife

## 2008-09-22 ENCOUNTER — Inpatient Hospital Stay (HOSPITAL_COMMUNITY): Admission: AD | Admit: 2008-09-22 | Discharge: 2008-09-22 | Payer: Self-pay | Admitting: Obstetrics and Gynecology

## 2008-09-27 ENCOUNTER — Inpatient Hospital Stay (HOSPITAL_COMMUNITY): Admission: AD | Admit: 2008-09-27 | Discharge: 2008-09-30 | Payer: Self-pay | Admitting: Obstetrics & Gynecology

## 2008-09-27 ENCOUNTER — Ambulatory Visit: Payer: Self-pay | Admitting: Obstetrics & Gynecology

## 2008-11-08 ENCOUNTER — Emergency Department (HOSPITAL_COMMUNITY): Admission: EM | Admit: 2008-11-08 | Discharge: 2008-11-09 | Payer: Self-pay | Admitting: Emergency Medicine

## 2008-11-08 IMAGING — CR DG SHOULDER 2+V*R*
3 series · 3 of 3 positions shown · non-contrast
Comparison: None

CLINICAL DATA: Shoulder pain for 1 month.

RIGHT SHOULDER - 2+ VIEW

[view not recorded (1 of 3)]
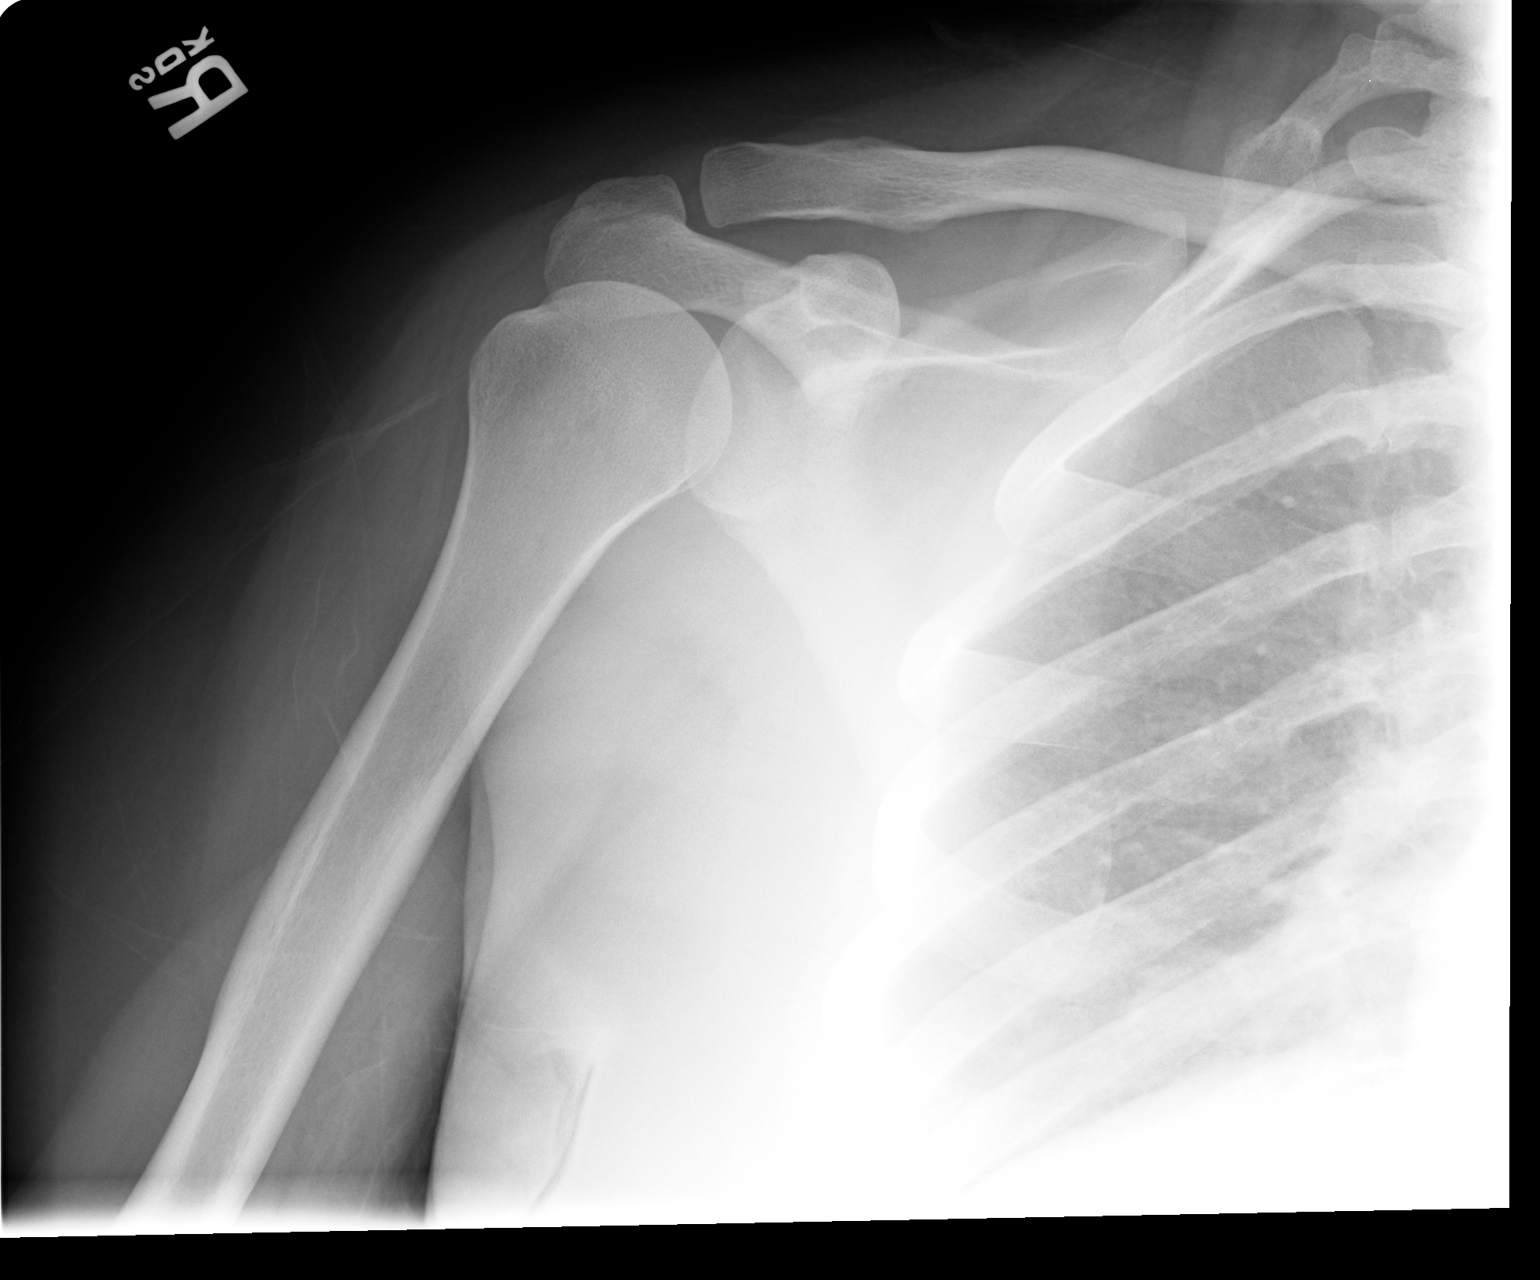

[view not recorded (2 of 3)]
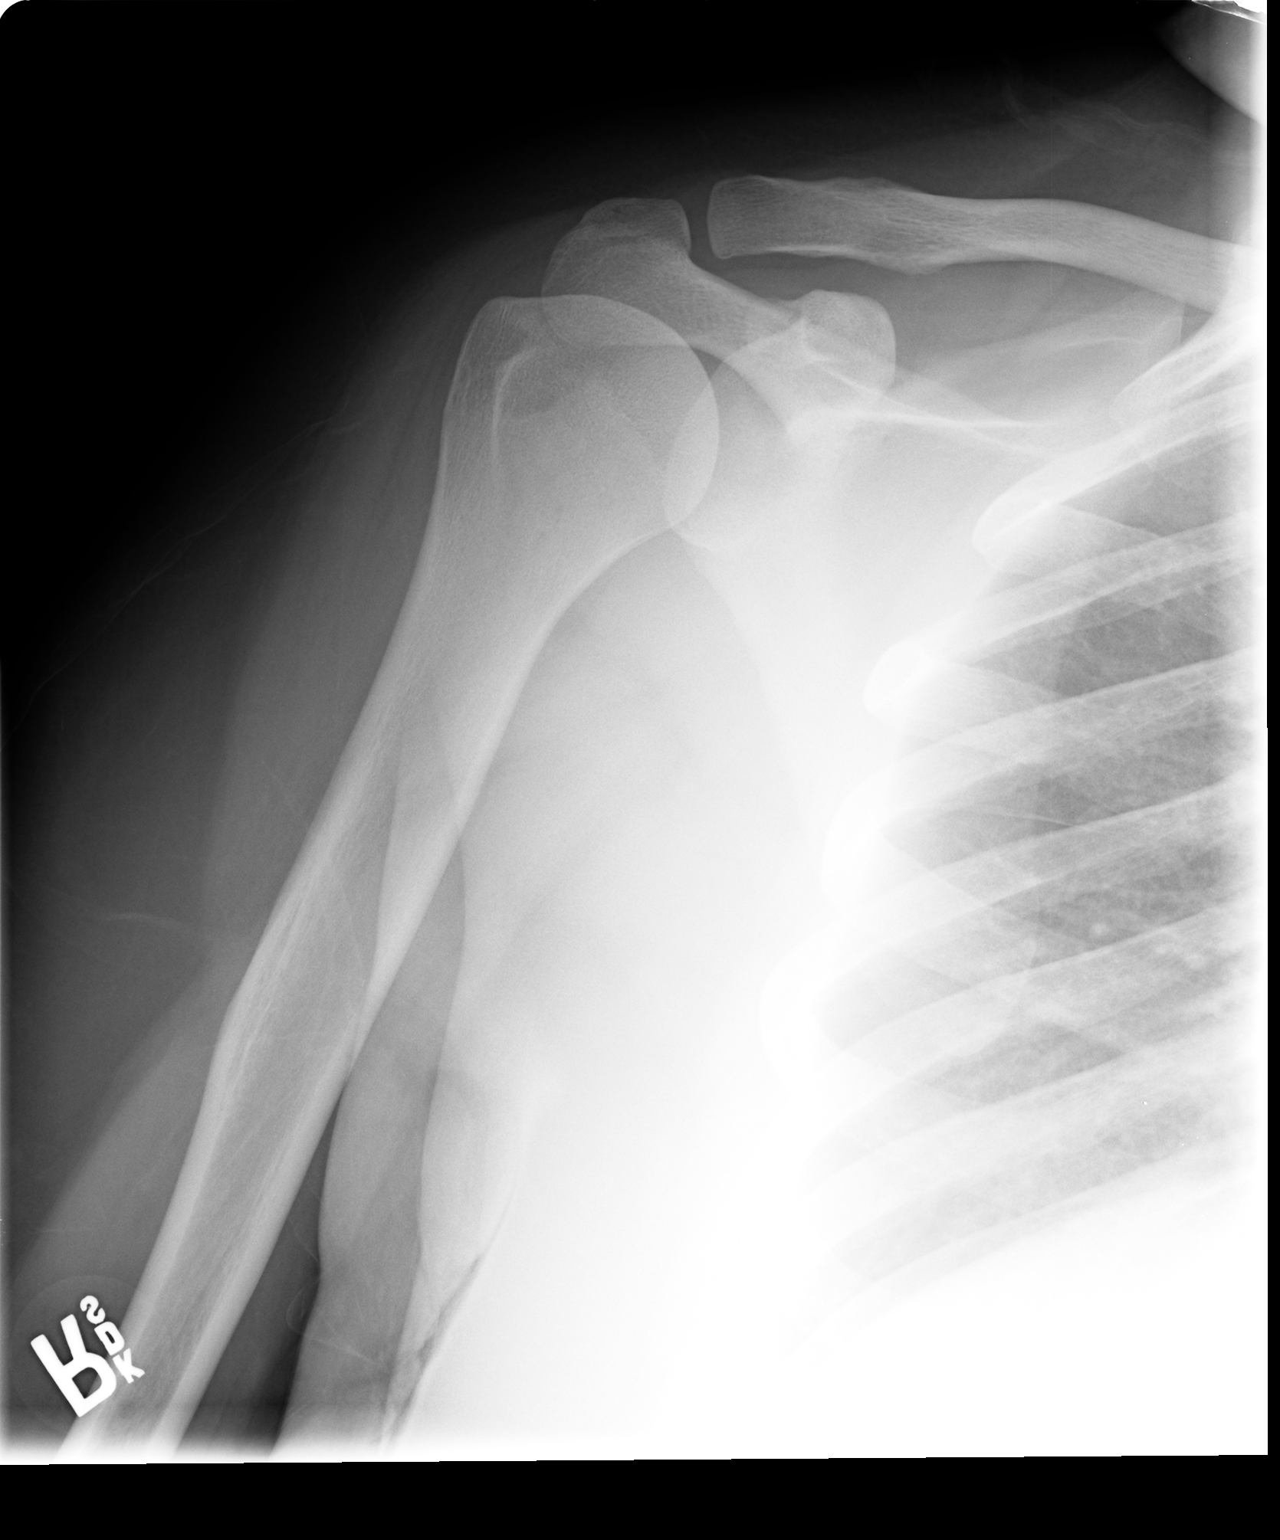

[view not recorded (3 of 3)]
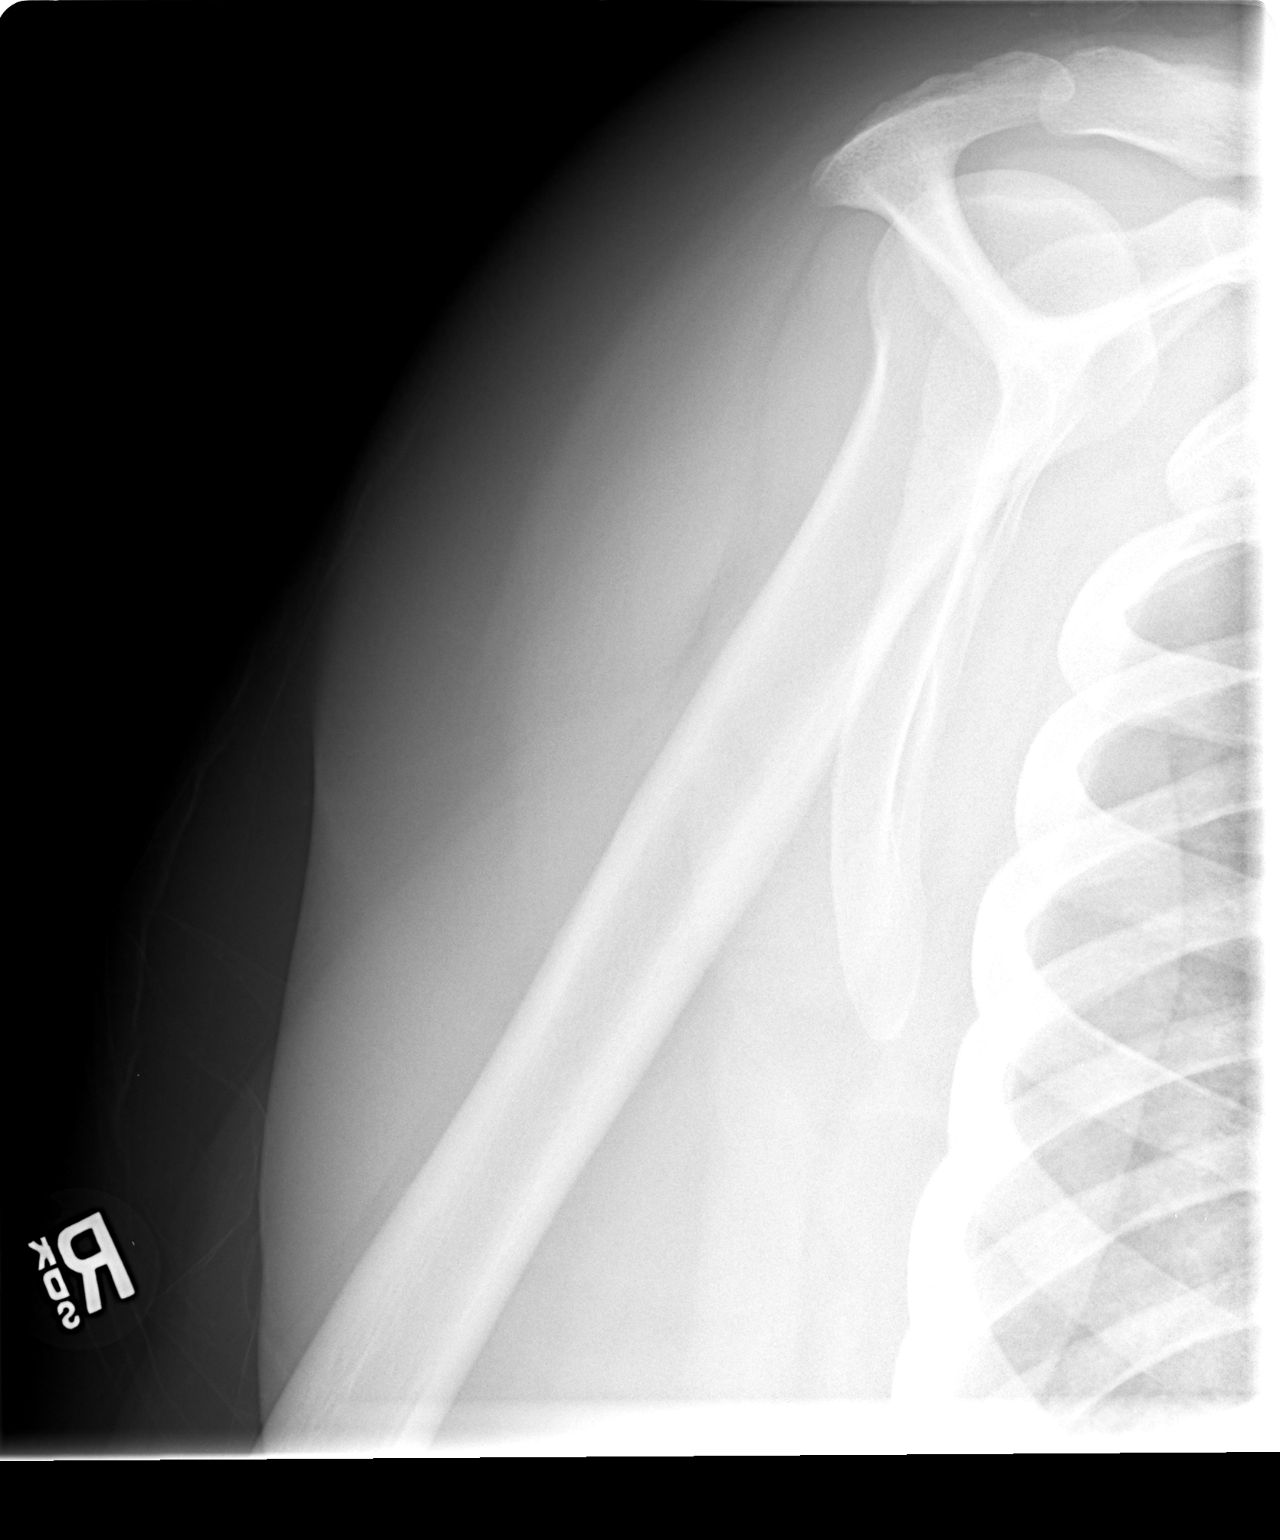

[3 of 3 positions shown; findings below may reference images not displayed]

FINDINGS: There is no evidence of fracture or dislocation.  There
is no evidence of arthropathy or other focal bone abnormality.
Soft tissues are unremarkable.
IMPRESSION: Negative.

## 2008-11-08 IMAGING — CR DG CHEST 2V
2 series · 2 of 2 positions shown · non-contrast
Comparison: None

CLINICAL DATA: Short of breath and chest pain.

CHEST - 2 VIEW

[view not recorded (1 of 2)]
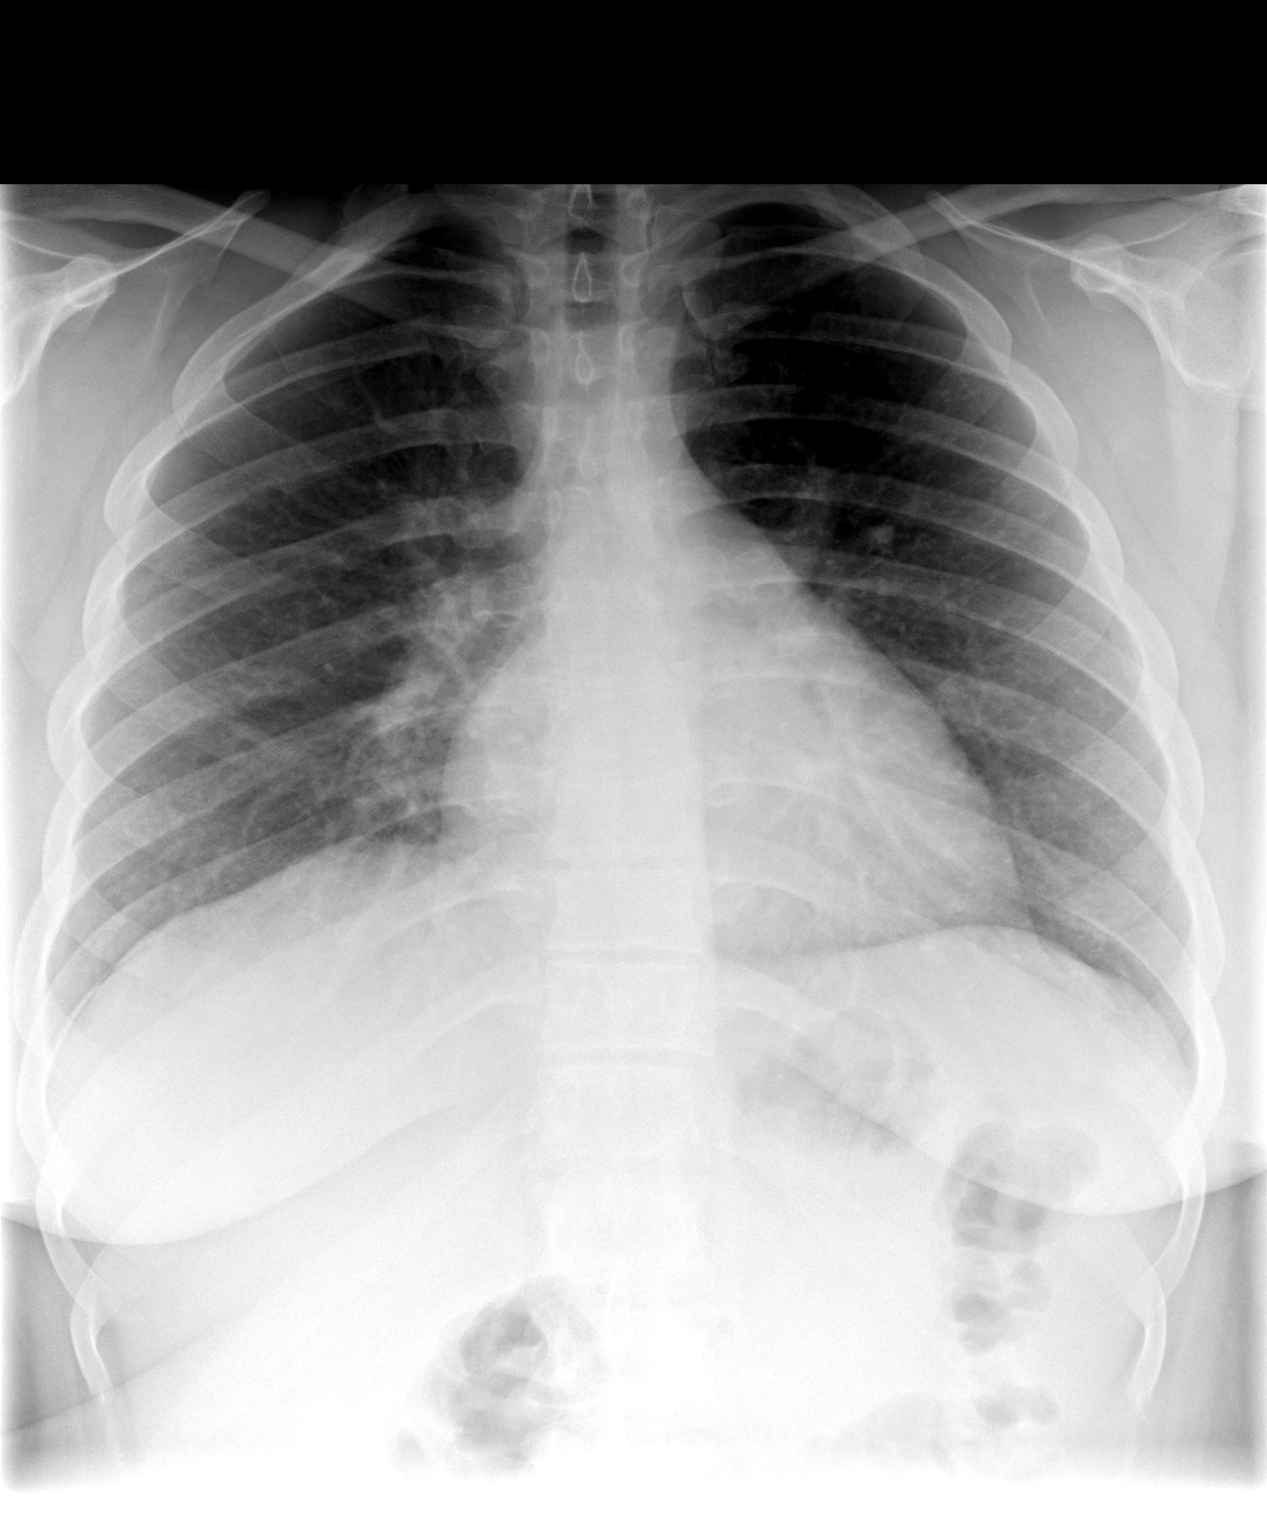

[view not recorded (2 of 2)]
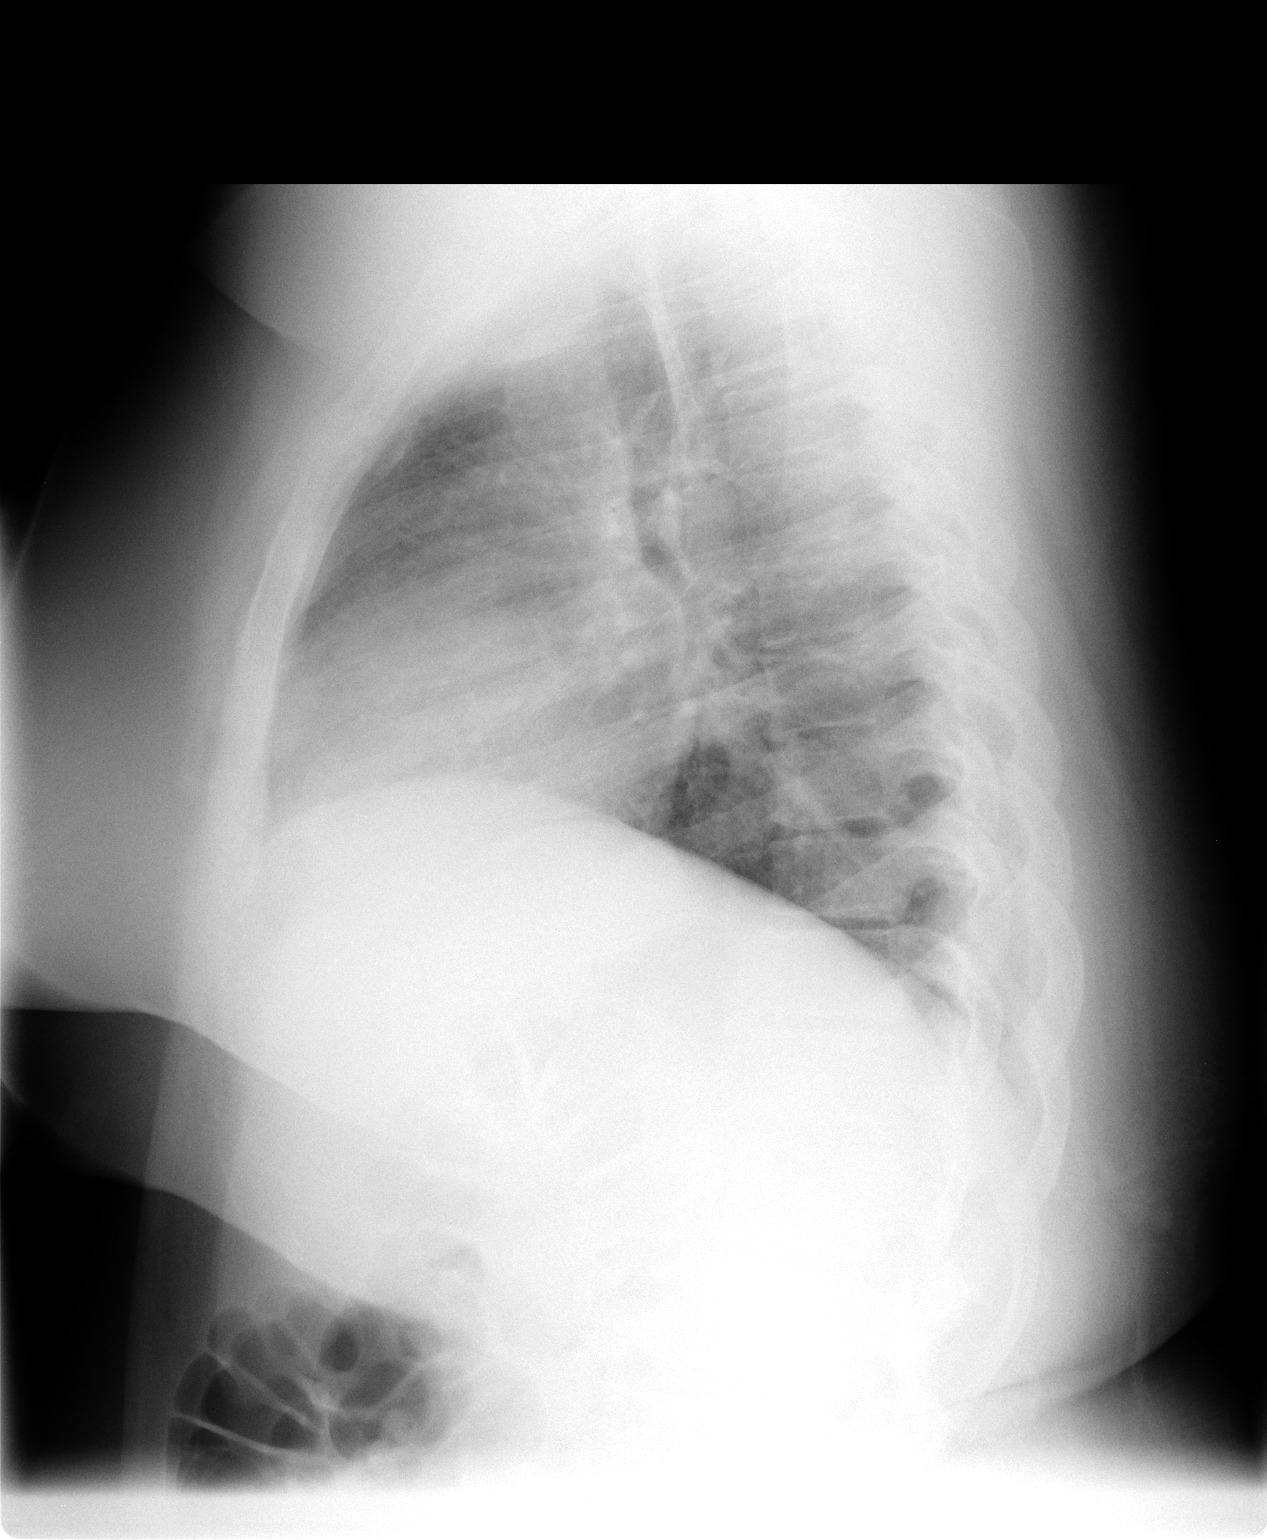

[2 of 2 positions shown; findings below may reference images not displayed]

FINDINGS: Heart size is upper normal and the vascularity is normal.
The lungs are clear there is no infiltrate or effusion.

Bilateral cervical ribs are noted.
IMPRESSION: No active cardiopulmonary disease.

## 2008-12-25 ENCOUNTER — Emergency Department (HOSPITAL_COMMUNITY): Admission: EM | Admit: 2008-12-25 | Discharge: 2008-12-25 | Payer: Self-pay | Admitting: Emergency Medicine

## 2008-12-25 IMAGING — CR DG ABDOMEN 2V
3 series · 3 of 3 positions shown · non-contrast
Comparison: None

CLINICAL DATA: Abdominal pain

ABDOMEN - 2 VIEW

[view not recorded (1 of 3)]
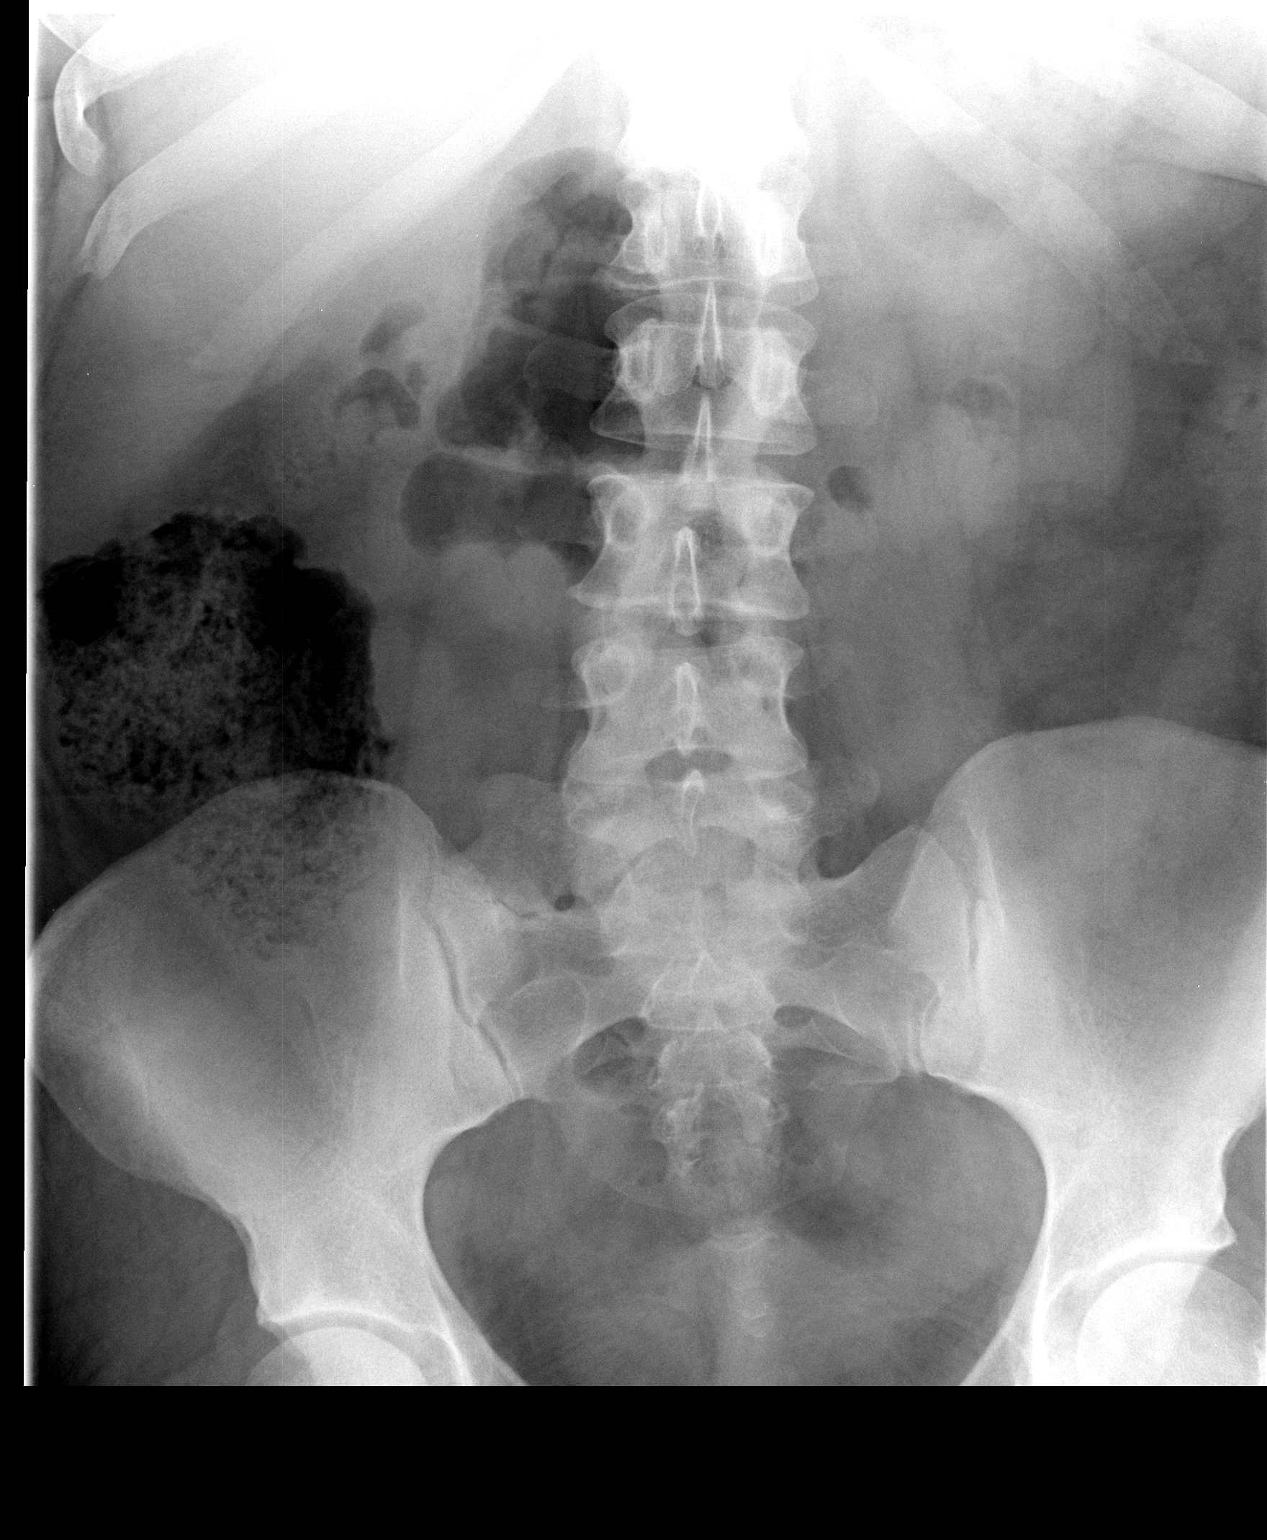

[view not recorded (2 of 3)]
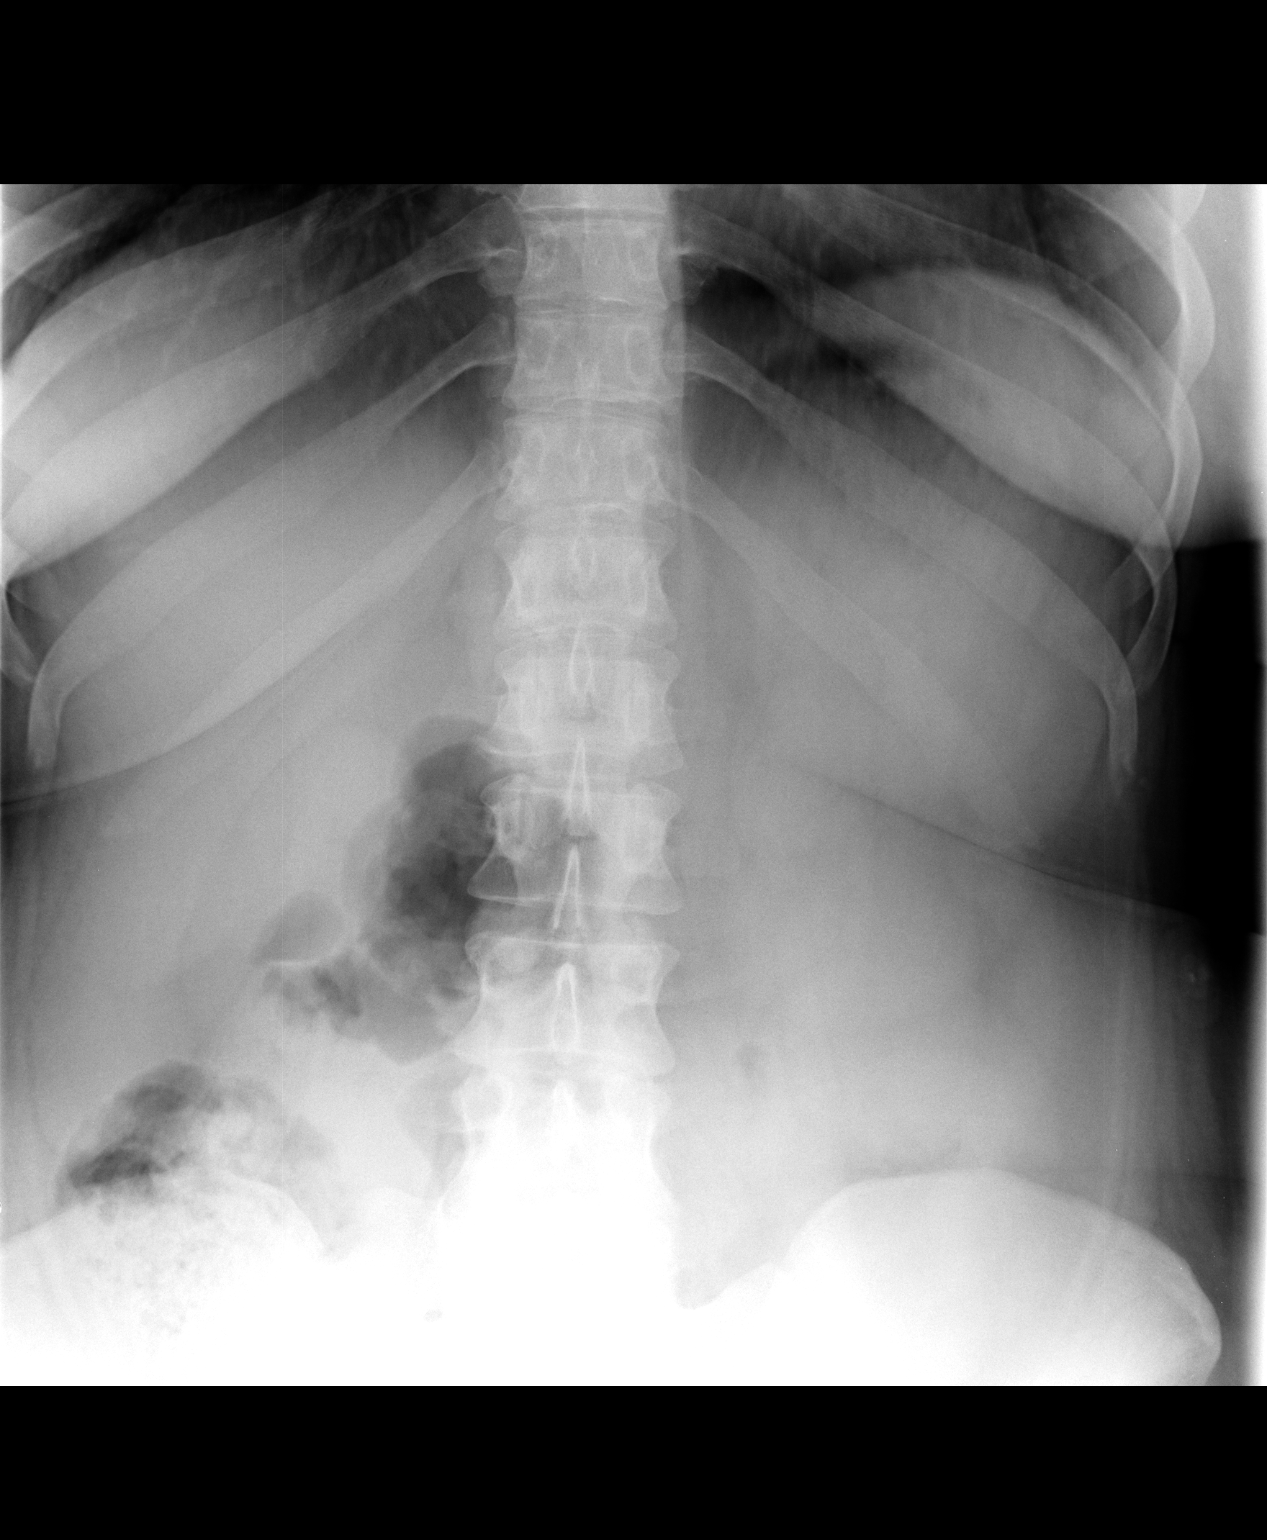

[view not recorded (3 of 3)]
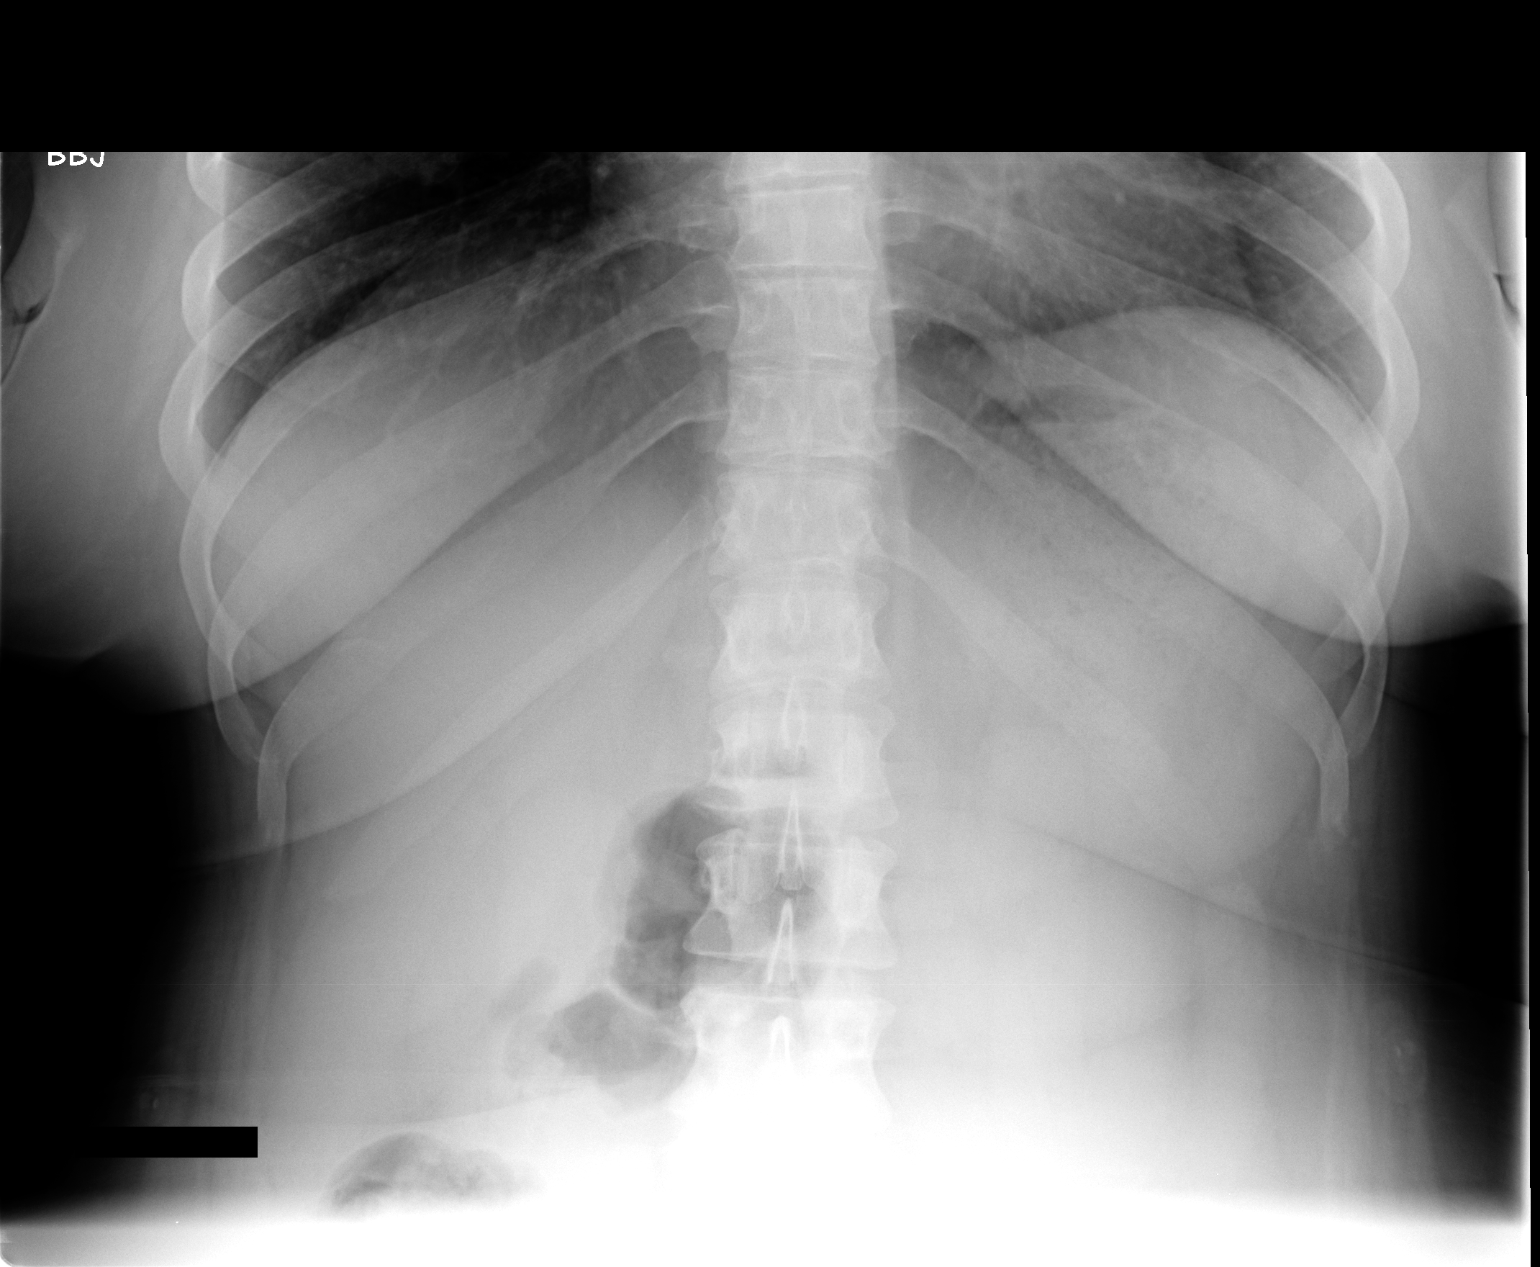

[3 of 3 positions shown; findings below may reference images not displayed]

FINDINGS: There is moderate stool in the right colon.  No dilated
loops of small bowel to suggest obstruction.  The soft tissue
shadows of the abdomen maintained.  No worrisome calcifications are
seen.  The bony structures are unremarkable.  There is partial
lumbarization of the S1 vertebral body.
IMPRESSION: No plain film evidence of acute abdominal process.

## 2008-12-25 IMAGING — CT CT PELVIS W/ CM
1 of 3 series · 14 of 32 positions shown, 19 images · IV contrast (Omnipaque 300)
Comparison: None

CT ABDOMEN

CLINICAL DATA: Abdominal pain, nausea and vomiting.

CT ABDOMEN AND PELVIS WITH CONTRAST
TECHNIQUE: Multidetector CT imaging of the abdomen and pelvis was
performed using the standard protocol following bolus
administration of intravenous contrast.
Contrast: 100 ml [73]

[Series 2: abd_pel 5.0 b40f · axial · 0.85mm/px · z∈[+698,+1103]mm · 14 of 93 slices shown, 19 images]
[im 6/93  soft-tissue]
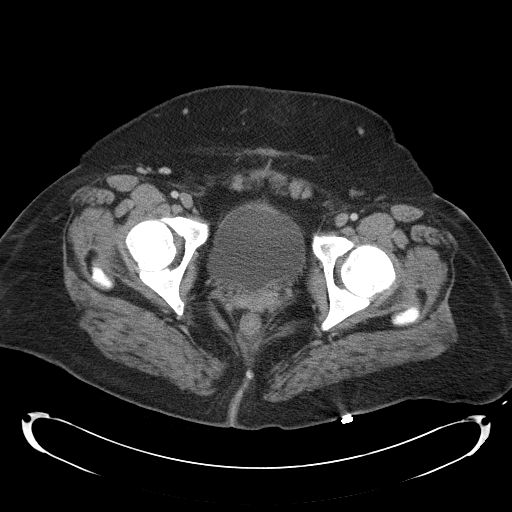
[im 6/93  bone]
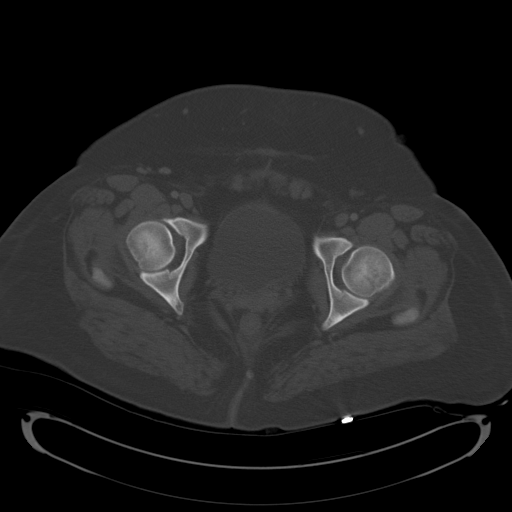
[im 12/93  soft-tissue]
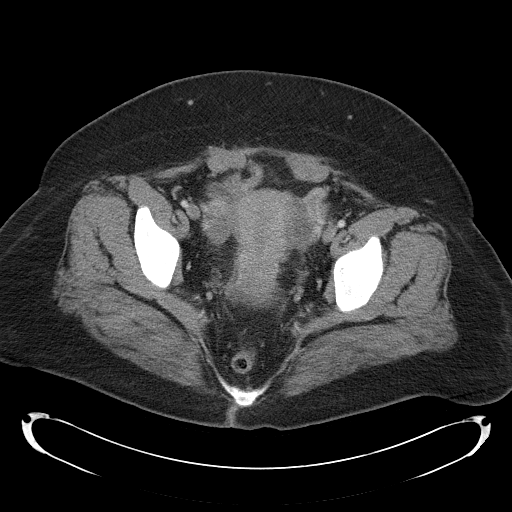
[im 18/93  soft-tissue]
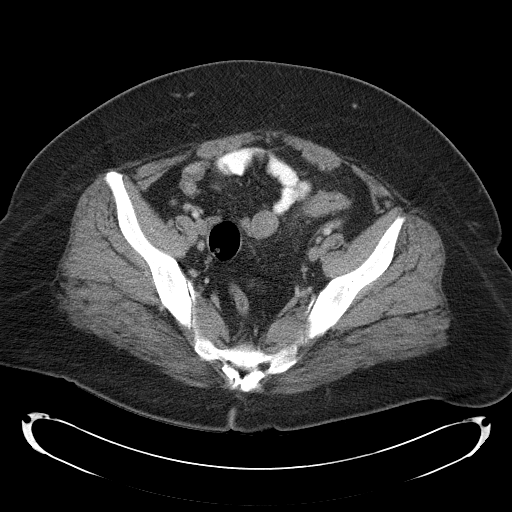
[im 29/93  soft-tissue]
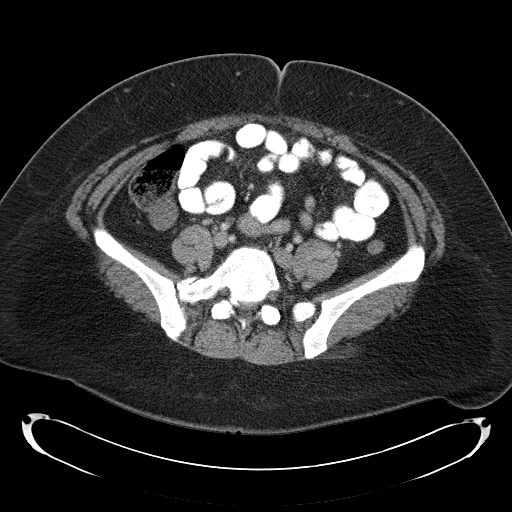
[im 35/93  soft-tissue]
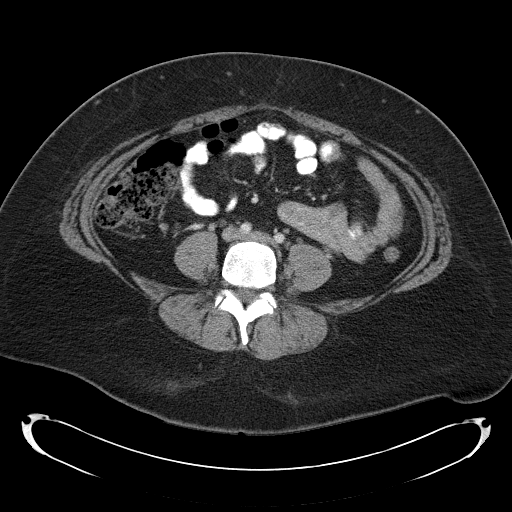
[im 41/93  soft-tissue]
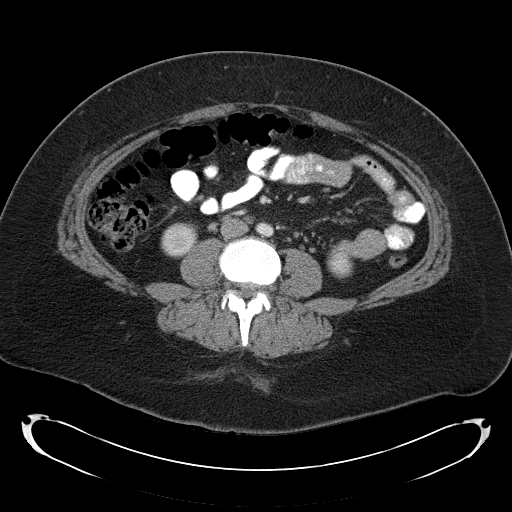
[im 47/93  soft-tissue]
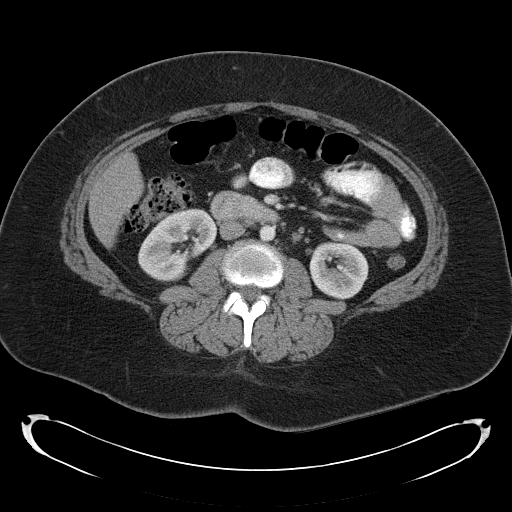
[im 52/93  soft-tissue]
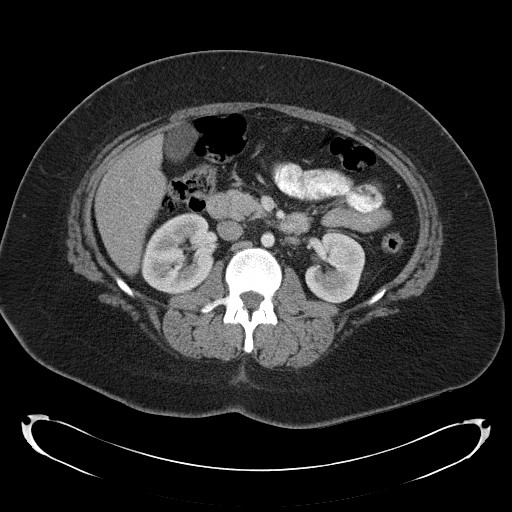
[im 58/93  soft-tissue]
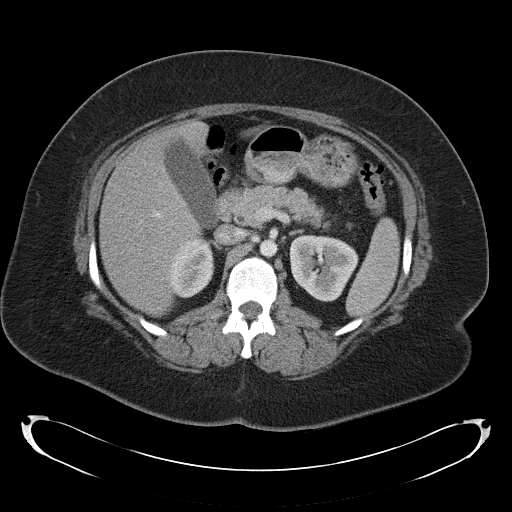
[im 58/93  bone]
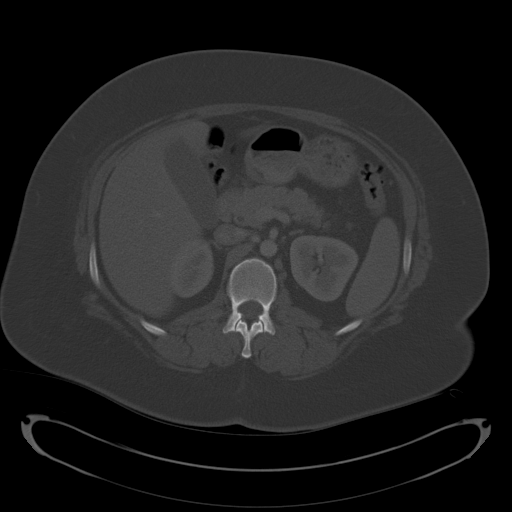
[im 64/93  soft-tissue]
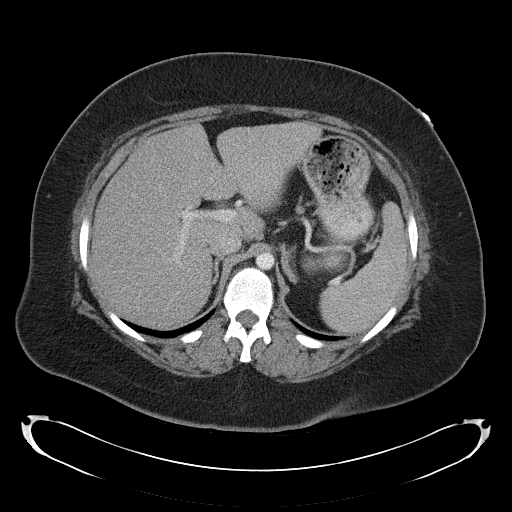
[im 70/93  lung]
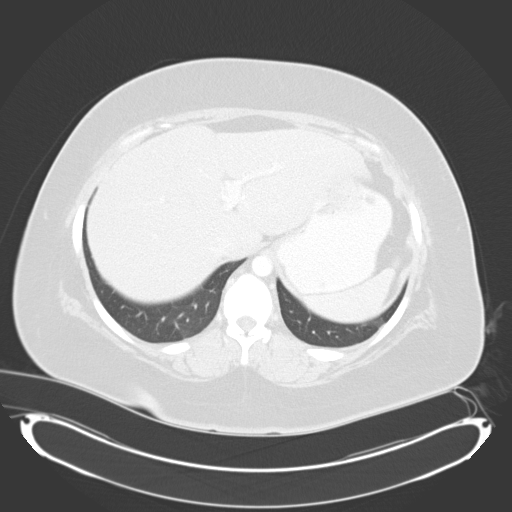
[im 75/93  soft-tissue]
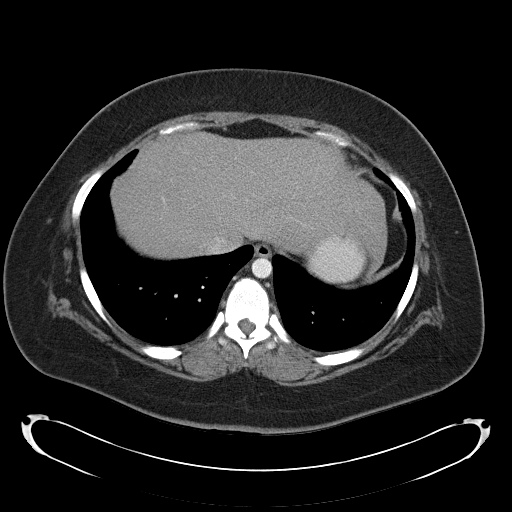
[im 75/93  lung]
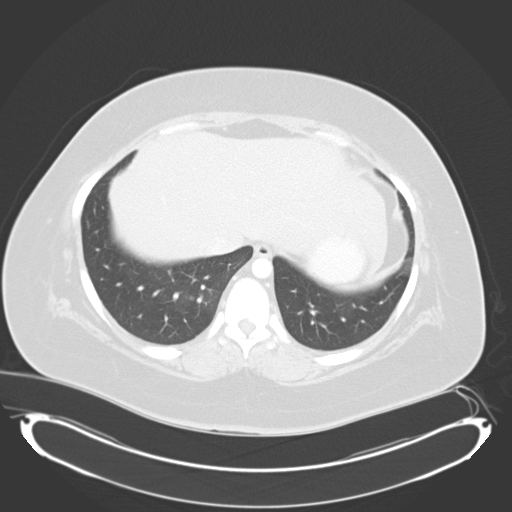
[im 81/93  soft-tissue]
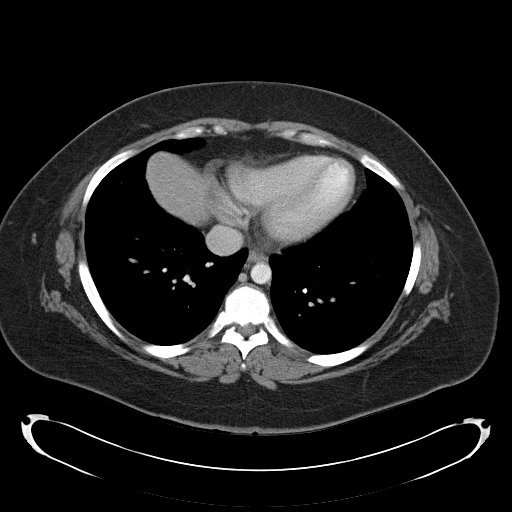
[im 81/93  lung]
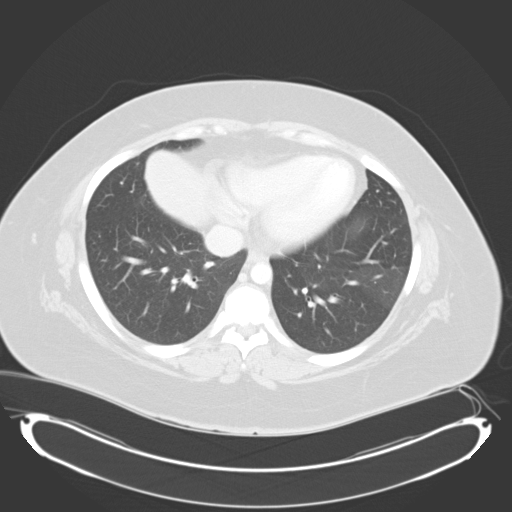
[im 87/93  soft-tissue]
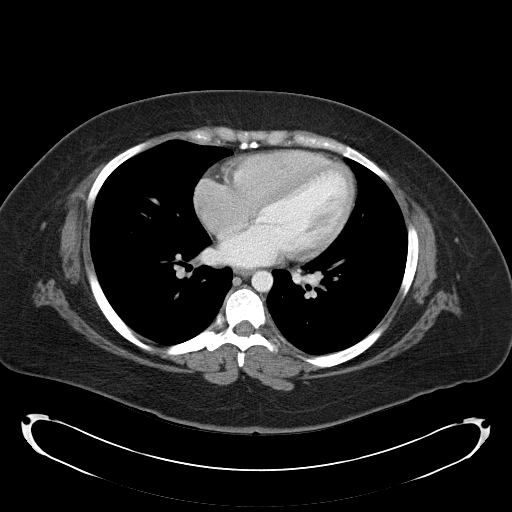
[im 87/93  lung]
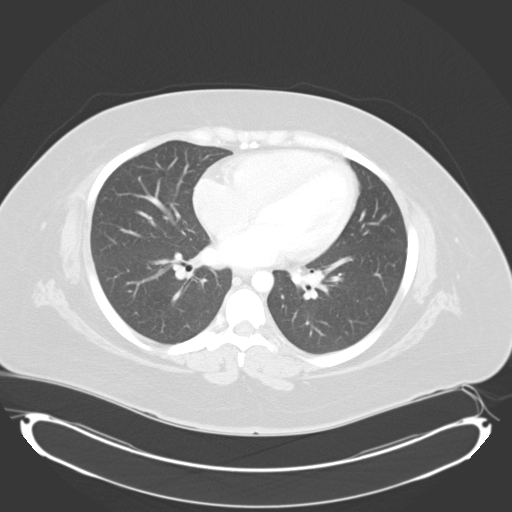

[14 of 32 positions shown; findings below may reference images not displayed]

FINDINGS: The lung bases are clear.  The liver demonstrates mild
diffuse fatty infiltration.  No focal hepatic lesions.  No biliary
dilatation.  A gallstone is noted in the gallbladder.  The spleen
is normal in size.  The pancreas is unremarkable.  The adrenal
glands and kidneys demonstrate no significant abnormalities.

The stomach, duodenum, small bowel and colon demonstrate no
significant findings.

The aorta is normal in caliber.  The major branch vessels are
normal.  There are small scattered mesenteric and retroperitoneal
lymph nodes but no adenopathy.  The bony structures are
unremarkable.
IMPRESSION: 1.  Mild diffuse fatty infiltration the liver.
2.  Cholelithiasis.
3.  Small scattered mesenteric and retroperitoneal lymph nodes but
no masses or adenopathy.

CT PELVIS
FINDINGS: The rectum, sigmoid colon and visualized small bowel
loops are unremarkable.  The appendix is visualized and is normal.
Both ovaries have complex cysts associated with them.  Ultrasound
correlation and follow up is suggested.  The bladder appears
normal.  No pelvic adenopathy.  The bony pelvis is intact.
IMPRESSION: 1.  No acute pelvic findings.
2.  Complex cysts associated with both ovaries.  Sonographic
correlation and follow up is suggested.

## 2008-12-25 IMAGING — US US ABDOMEN COMPLETE
1 series · 14 of 25 positions shown · non-contrast
Comparison: None
Correlation:  CT abdomen [DATE]

CLINICAL DATA: Chest and upper abdominal pain

ABDOMEN ULTRASOUND
TECHNIQUE: Complete abdominal ultrasound examination was performed
including evaluation of the liver, gallbladder, bile ducts,
pancreas, kidneys, spleen, IVC, and abdominal aorta.

[Series 1: unknown · 0.37mm/px · 14 of 76 slices shown]
[im 1/76]
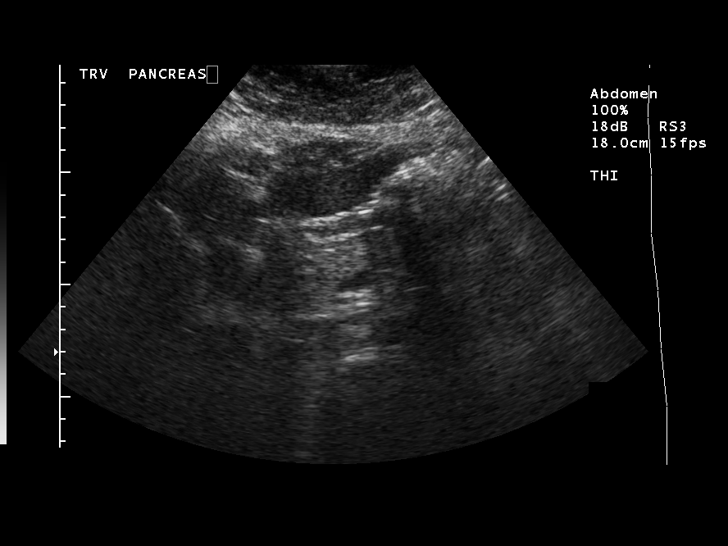
[im 7/76]
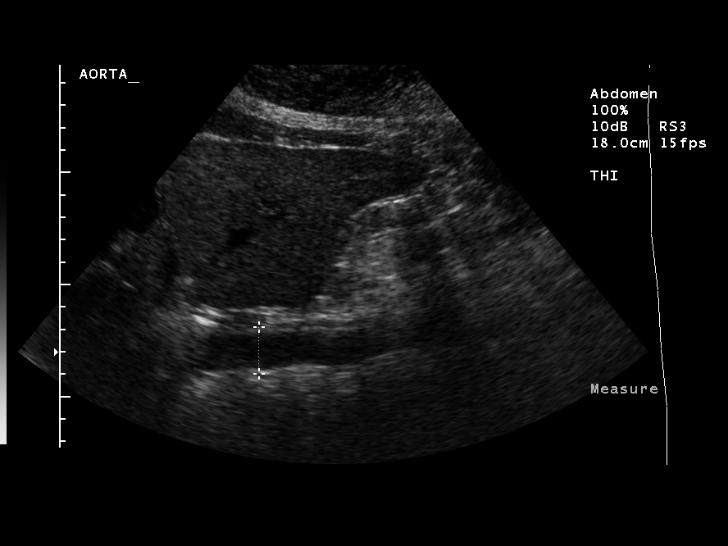
[im 13/76]
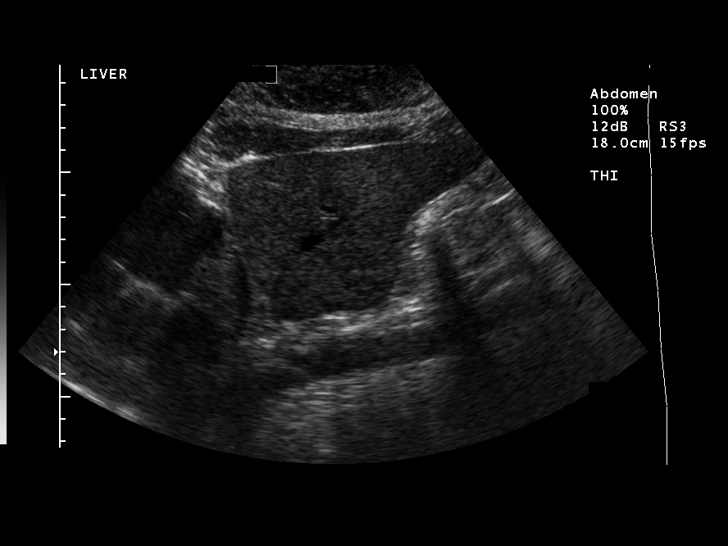
[im 19/76]
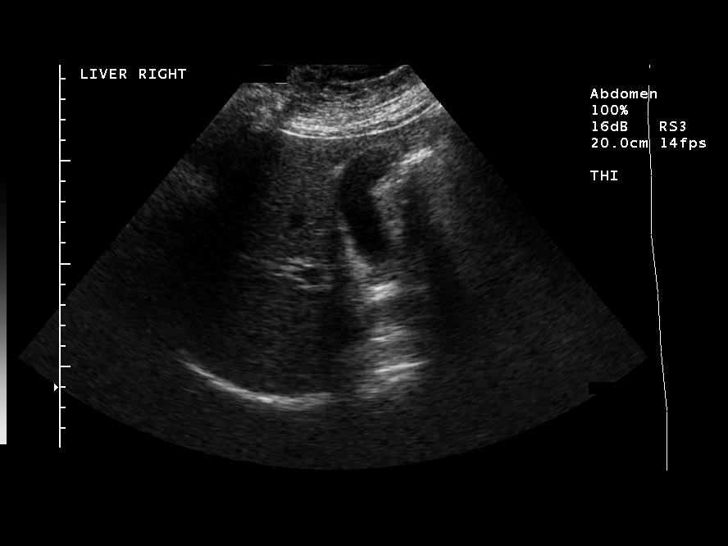
[im 26/76]
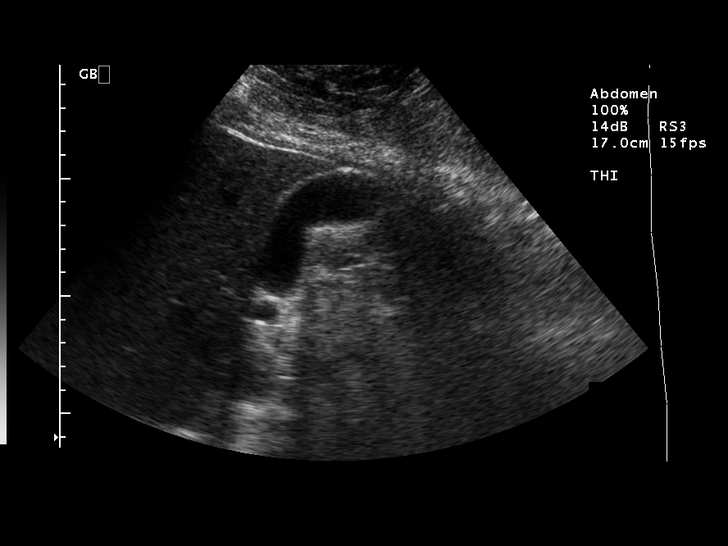
[im 29/76]
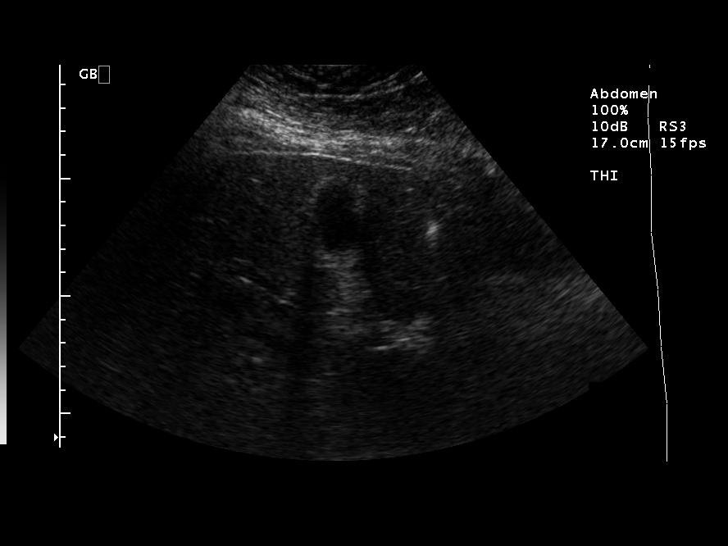
[im 35/76]
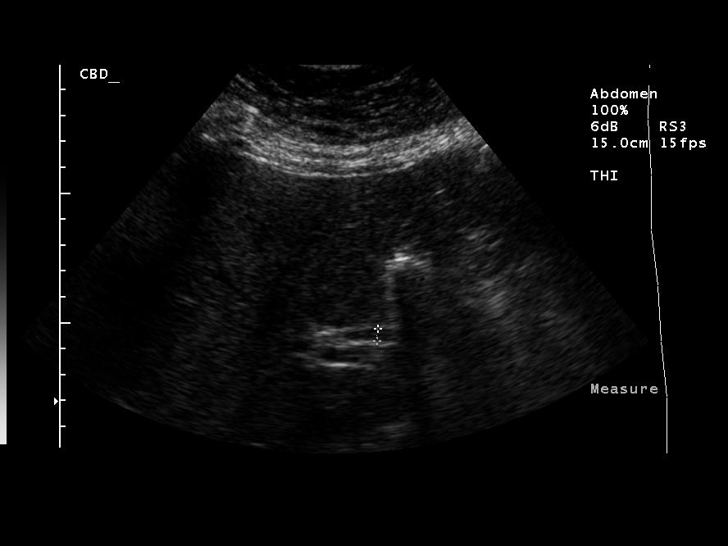
[im 41/76]
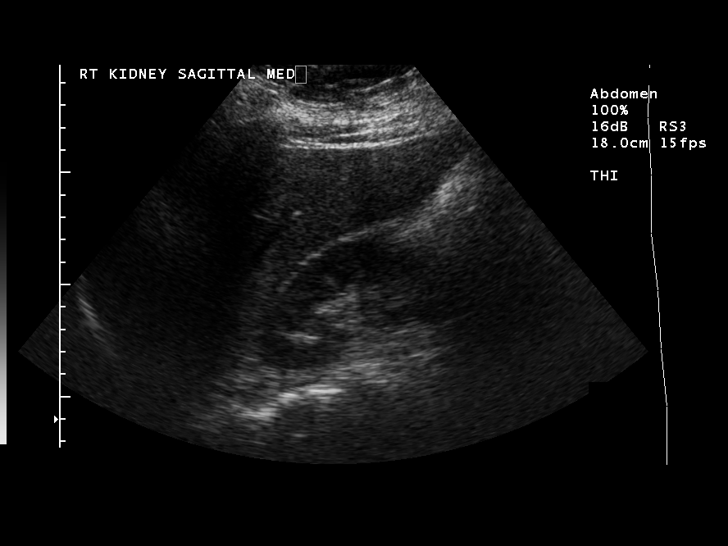
[im 47/76]
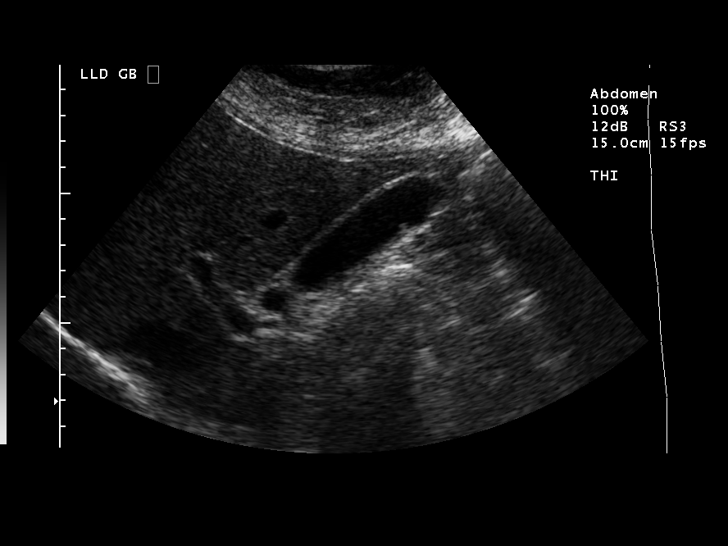
[im 51/76]
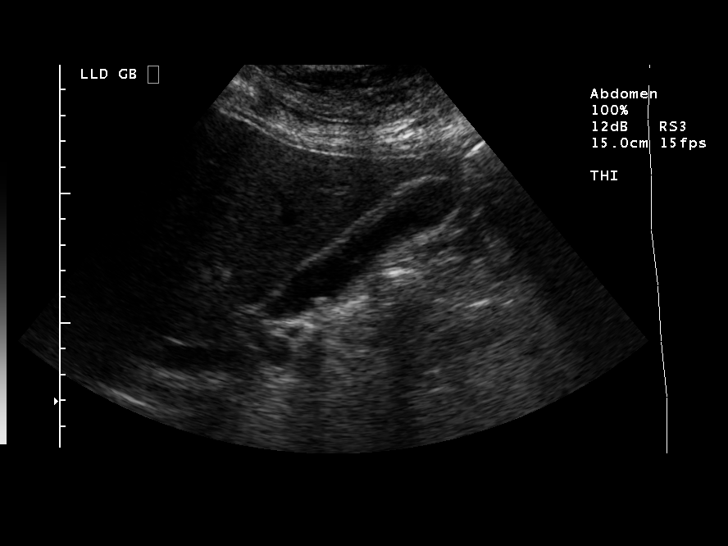
[im 57/76]
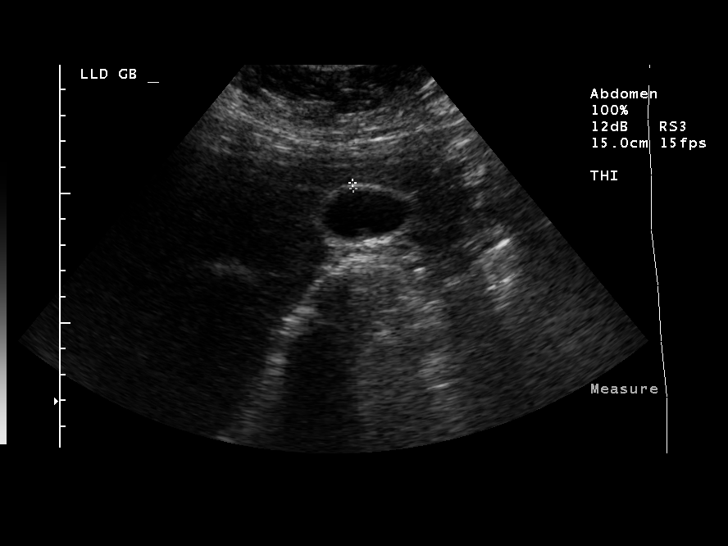
[im 63/76]
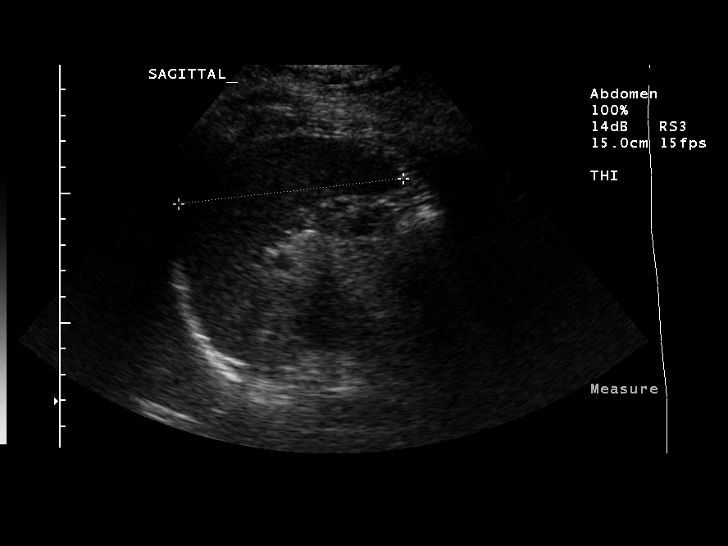
[im 69/76]
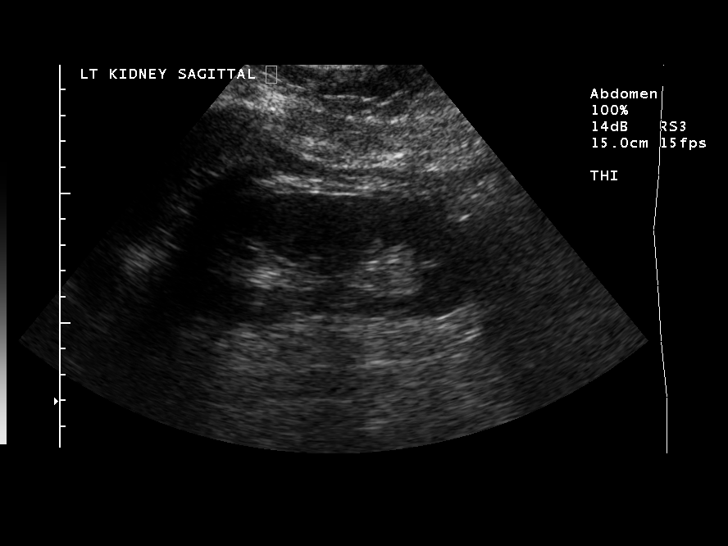
[im 76/76]
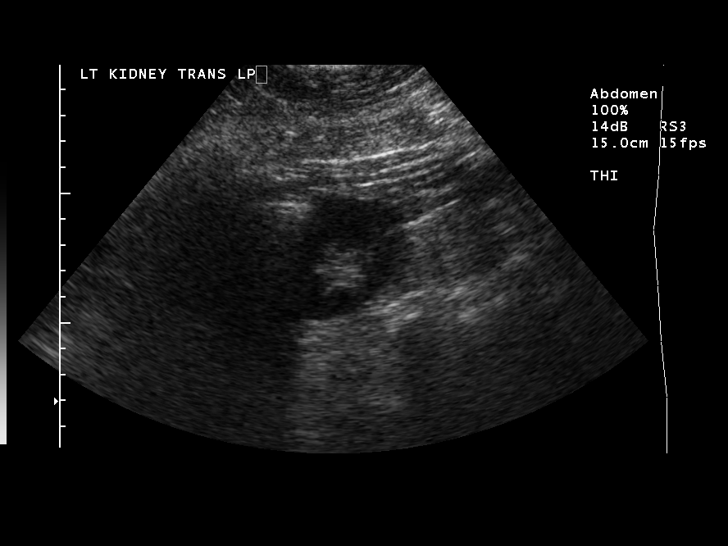

[14 of 25 positions shown; findings below may reference images not displayed]

FINDINGS: Shadowing calculi dependently in gallbladder.
No gallbladder wall thickening, pericholecystic fluid, or
sonographic Murphy's sign.
Common bile duct normal caliber 5 mm diameter.
Largest discrete calculus measures 4.4 mm diameter.
Minimally echogenic liver, question mild fatty infiltration, though
this can be seen with cirrhosis and certain infiltrative disorders.
Small portion of pancreatic body normal appearance with remainder
of pancreas obscured.
Liver and spleen unremarkable, spleen 8.7 cm length.
Kidneys normal size and morphology, 12.4 cm length right 11.6 cm
length left.
Aorta and IVC normal.
No free fluid.
IMPRESSION: Cholelithiasis without evidence of acute cholecystitis.
Incomplete pancreatic visualization.
Probable minimal fatty infiltration of liver.

REF:A1 DICTATED: [DATE] [DATE]

## 2009-01-02 ENCOUNTER — Ambulatory Visit (HOSPITAL_COMMUNITY): Admission: RE | Admit: 2009-01-02 | Discharge: 2009-01-03 | Payer: Self-pay | Admitting: General Surgery

## 2009-01-02 ENCOUNTER — Encounter (INDEPENDENT_AMBULATORY_CARE_PROVIDER_SITE_OTHER): Payer: Self-pay | Admitting: General Surgery

## 2009-06-19 ENCOUNTER — Emergency Department (HOSPITAL_COMMUNITY): Admission: EM | Admit: 2009-06-19 | Discharge: 2009-06-19 | Payer: Self-pay | Admitting: Emergency Medicine

## 2009-12-20 ENCOUNTER — Emergency Department (HOSPITAL_COMMUNITY): Admission: EM | Admit: 2009-12-20 | Discharge: 2009-12-21 | Payer: Self-pay | Admitting: Emergency Medicine

## 2010-04-27 ENCOUNTER — Emergency Department (HOSPITAL_COMMUNITY): Admission: EM | Admit: 2010-04-27 | Discharge: 2010-04-27 | Payer: Self-pay | Admitting: Emergency Medicine

## 2010-04-27 IMAGING — CR DG LUMBAR SPINE COMPLETE 4+V
5 series · 5 of 5 positions shown · non-contrast
Comparison: [DATE]

CLINICAL DATA: Low back pain.

LUMBAR SPINE - COMPLETE 4+ VIEW

[view not recorded (1 of 5)]
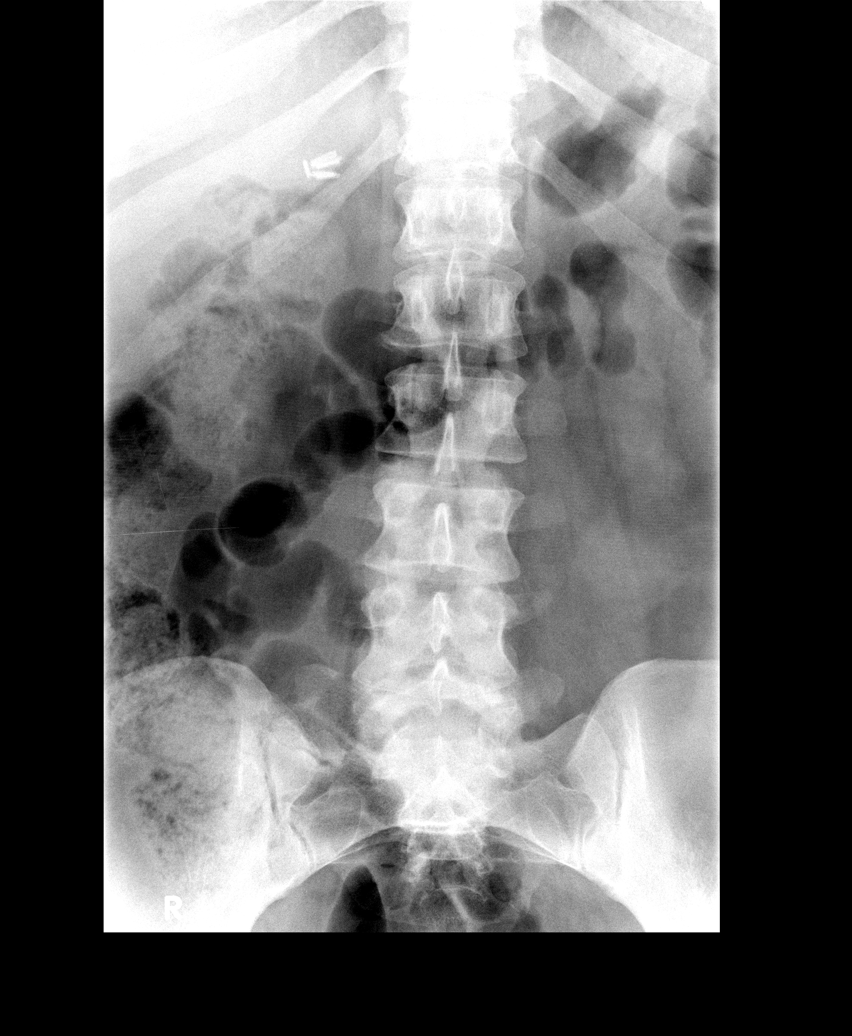

[view not recorded (2 of 5)]
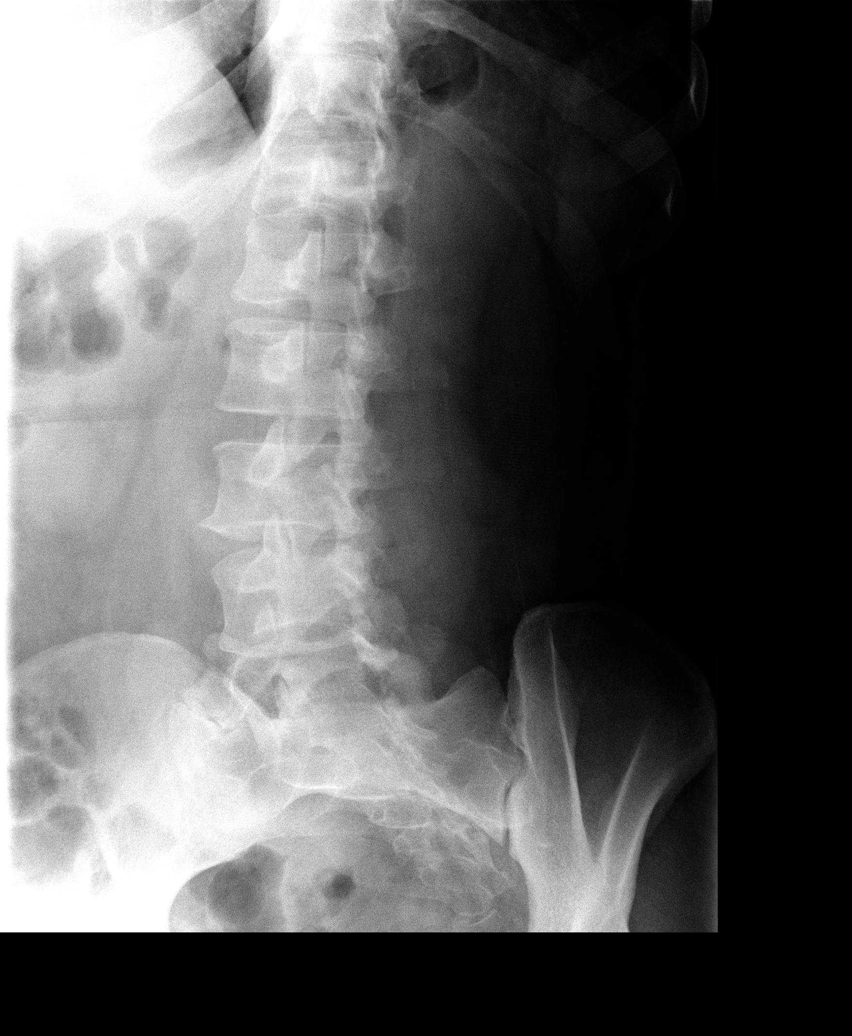

[view not recorded (3 of 5)]
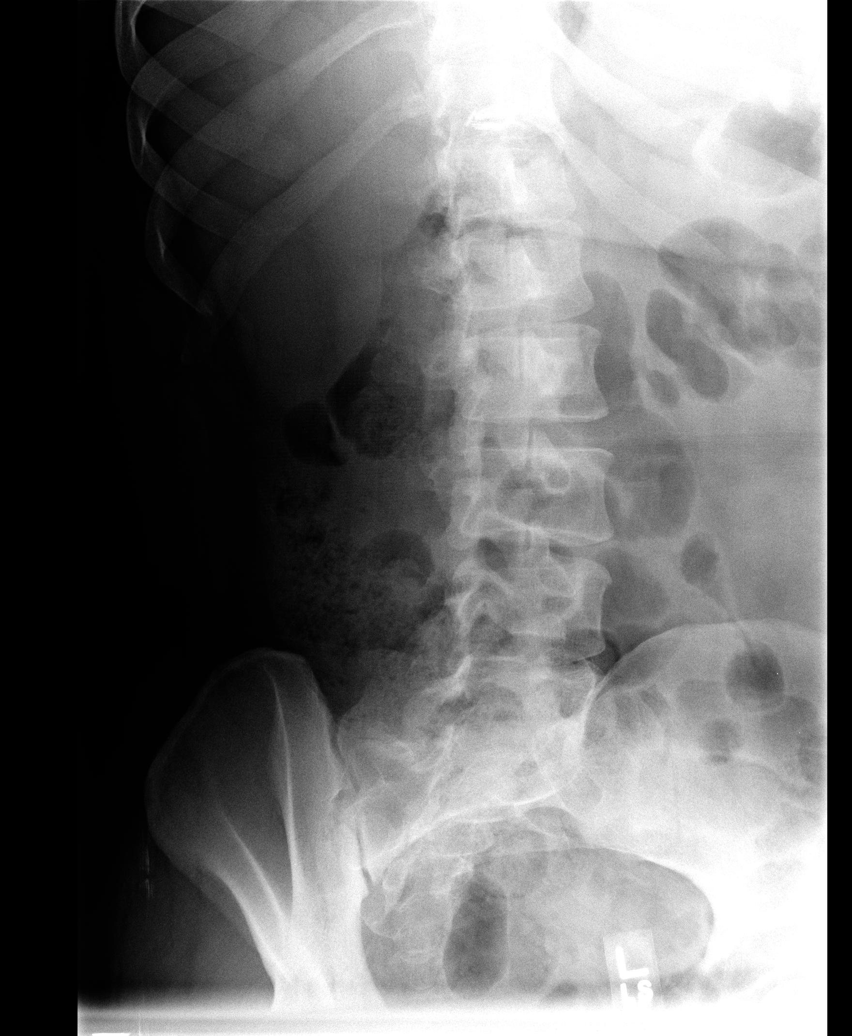

[view not recorded (4 of 5)]
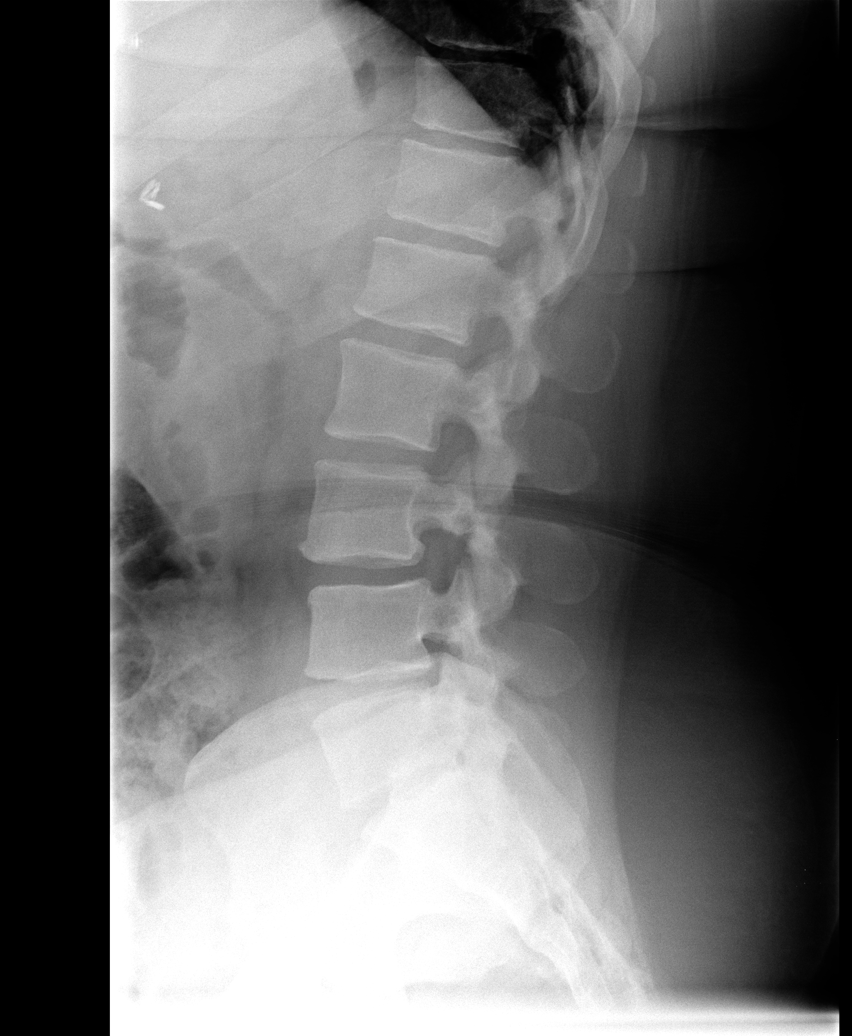

[view not recorded (5 of 5)]
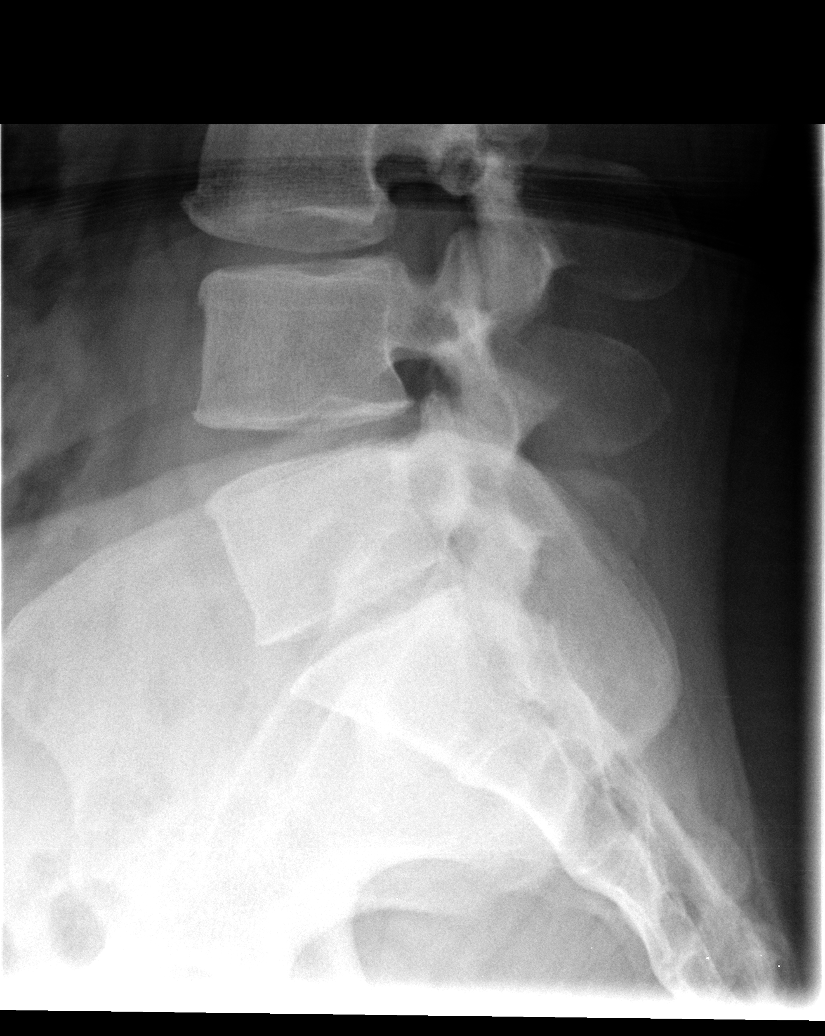

[5 of 5 positions shown; findings below may reference images not displayed]

FINDINGS: The prior lumbar MRI labeled the lowest disc space S1-
S2.  Based on a prior CT of [DATE], there appears to be a small
rib on the right at T12.  I have  changed lumbar  numbering to
account for this, the lowest disc space is now L5-S1.  L5 is
partially incorporated into the sacrum on the right.

Grade 1 anterior slip of L3 on L4 has developed since the prior
study.  There is mild disc degeneration and spurring at L3-4.
There is progression of disc space narrowing and spurring at L4-5
since the prior study.  There is also moderate disc space narrowing
at L5-S1.

Negative for pars defect.  Negative for fracture.
IMPRESSION: The lowest open interspace is labeled L5-S1.  L5 is a transitional
vertebra and partially incorporated into the sacrum on the right.

Grade 1 anterior slip of L3 on L4.  This has developed since [9C].
There is also progression of disc degeneration at L3-4 and  L4-5
since the prior study.

## 2010-12-07 ENCOUNTER — Encounter: Payer: Self-pay | Admitting: Obstetrics & Gynecology

## 2010-12-07 ENCOUNTER — Encounter: Payer: Self-pay | Admitting: Family Medicine

## 2011-02-04 LAB — BASIC METABOLIC PANEL
Calcium: 9.5 mg/dL (ref 8.4–10.5)
Creatinine, Ser: 0.62 mg/dL (ref 0.4–1.2)
GFR calc Af Amer: >60 mL/min (ref >60)
GFR calc non Af Amer: >60 mL/min (ref >60)
Glucose, Bld: 96 mg/dL (ref 70–99)
Sodium: 135 mEq/L (ref 135–145)

## 2011-02-04 LAB — URINE MICROSCOPIC-ADD ON

## 2011-02-04 LAB — URINALYSIS, ROUTINE W REFLEX MICROSCOPIC
Bilirubin Urine: NEGATIVE
Glucose, UA: NEGATIVE mg/dL
Ketones, ur: NEGATIVE mg/dL
Leukocytes, UA: NEGATIVE
Specific Gravity, Urine: <1.005 — ABNORMAL LOW (ref 1.005–1.030)
pH: 6 (ref 5.0–8.0)

## 2011-02-04 LAB — DIFFERENTIAL
Basophils Absolute: 0 10*3/uL (ref 0.0–0.1)
Basophils Relative: 0 % (ref 0–1)
Lymphocytes Relative: 15 % (ref 12–46)
Monocytes Absolute: 0.8 10*3/uL (ref 0.1–1.0)
Neutro Abs: 6.9 10*3/uL (ref 1.7–7.7)
Neutrophils Relative %: 74 % (ref 43–77)

## 2011-02-04 LAB — CBC
Hemoglobin: 13.3 g/dL (ref 12.0–15.0)
MCHC: 33.3 g/dL (ref 30.0–36.0)
RDW: 16.7 % — ABNORMAL HIGH (ref 11.5–15.5)

## 2011-03-03 LAB — CBC
HCT: 38.6 % (ref 36.0–46.0)
Hemoglobin: 12.8 g/dL (ref 12.0–15.0)
MCHC: 32.8 g/dL (ref 30.0–36.0)
MCHC: 32.9 g/dL (ref 30.0–36.0)
MCV: 75.1 fL — ABNORMAL LOW (ref 78.0–100.0)
Platelets: 347 10*3/uL (ref 150–400)
RBC: 5.18 MIL/uL — ABNORMAL HIGH (ref 3.87–5.11)
RBC: 4.75 MIL/uL (ref 3.87–5.11)
RDW: 15.3 % (ref 11.5–15.5)
RDW: 15.9 % — ABNORMAL HIGH (ref 11.5–15.5)
RDW: 16 % — ABNORMAL HIGH (ref 11.5–15.5)
WBC: 22.2 10*3/uL — ABNORMAL HIGH (ref 4.0–10.5)

## 2011-03-03 LAB — COMPREHENSIVE METABOLIC PANEL
ALT: 25 U/L (ref 0–35)
Alkaline Phosphatase: 84 U/L (ref 39–117)
BUN: 13 mg/dL (ref 6–23)
CO2: 27 mEq/L (ref 19–32)
Chloride: 106 mEq/L (ref 96–112)
GFR calc non Af Amer: >60 mL/min (ref >60)
Glucose, Bld: 105 mg/dL — ABNORMAL HIGH (ref 70–99)
Potassium: 3.9 mEq/L (ref 3.5–5.1)
Sodium: 138 mEq/L (ref 135–145)
Total Bilirubin: 0.4 mg/dL (ref 0.3–1.2)
Total Protein: 7.3 g/dL (ref 6.0–8.3)

## 2011-03-03 LAB — BASIC METABOLIC PANEL
CO2: 24 mEq/L (ref 19–32)
Calcium: 9 mg/dL (ref 8.4–10.5)
Creatinine, Ser: 0.58 mg/dL (ref 0.4–1.2)
GFR calc non Af Amer: >60 mL/min (ref >60)
Glucose, Bld: 120 mg/dL — ABNORMAL HIGH (ref 70–99)

## 2011-03-03 LAB — DIFFERENTIAL
Basophils Absolute: 0 10*3/uL (ref 0.0–0.1)
Basophils Absolute: 0 10*3/uL (ref 0.0–0.1)
Basophils Relative: 0 % (ref 0–1)
Basophils Relative: 0 % (ref 0–1)
Eosinophils Absolute: 0.2 10*3/uL (ref 0.0–0.7)
Eosinophils Absolute: 0.4 10*3/uL (ref 0.0–0.7)
Eosinophils Relative: 3 % (ref 0–5)
Lymphocytes Relative: 9 % — ABNORMAL LOW (ref 12–46)
Lymphs Abs: 3 10*3/uL (ref 0.7–4.0)
Monocytes Absolute: 0.8 10*3/uL (ref 0.1–1.0)
Monocytes Relative: 1 % — ABNORMAL LOW (ref 3–12)
Neutro Abs: 19.6 10*3/uL — ABNORMAL HIGH (ref 1.7–7.7)
Neutro Abs: 13.3 10*3/uL — ABNORMAL HIGH (ref 1.7–7.7)
Neutrophils Relative %: 89 % — ABNORMAL HIGH (ref 43–77)
Neutrophils Relative %: 64 % (ref 43–77)
Neutrophils Relative %: 87 % — ABNORMAL HIGH (ref 43–77)

## 2011-03-03 LAB — LIPASE, BLOOD: Lipase: 39 U/L (ref 11–59)

## 2011-03-03 LAB — HEPATIC FUNCTION PANEL
ALT: 22 U/L (ref 0–35)
AST: 14 U/L (ref 0–37)
AST: 17 U/L (ref 0–37)
Albumin: 3.2 g/dL — ABNORMAL LOW (ref 3.5–5.2)
Bilirubin, Direct: 0.1 mg/dL (ref 0.0–0.3)
Bilirubin, Direct: 0.1 mg/dL (ref 0.0–0.3)
Indirect Bilirubin: 0.1 mg/dL — ABNORMAL LOW (ref 0.3–0.9)
Total Bilirubin: 0.2 mg/dL — ABNORMAL LOW (ref 0.3–1.2)
Total Protein: 6.5 g/dL (ref 6.0–8.3)

## 2011-03-03 LAB — PREGNANCY, URINE: Preg Test, Ur: NEGATIVE

## 2011-03-31 NOTE — Op Note (Signed)
[name], Katrina Lambert NO.:  [id number]   MEDICAL RECORD NO.:  [id number]          PATIENT TYPE:  OIB   LOCATION:  A305                          FACILITY:  [facility]   PHYSICIAN:  Barbaraann Barthel, M.D. DATE OF BIRTH:  August 29, 1986   DATE OF PROCEDURE:  01/02/2009  DATE OF DISCHARGE:                               OPERATIVE REPORT   SURGEON:  Barbaraann Barthel, MD   PREOPERATIVE DIAGNOSES:  Cholecystitis, cholelithiasis.   POSTOPERATIVE DIAGNOSES:  Cholecystitis, cholelithiasis.   PROCEDURE:  Laparoscopic cholecystectomy.   SPECIMEN:  Gallbladder.   WOUND CLASSIFICATION:  Clean contaminated.   Note, this is a 25 year old obese white female who had a 2-week history  of epigastric pain with nausea and no vomiting.  It was postprandial-  type pain radiating to her back.  Sonogram revealed the presence of  stones.  She was placed on a restrictive diet and plans were made for  outpatient laparoscopic cholecystectomy.  We discussed the procedure  with her in detail discussing complications not limited to but including  bleeding, infection, damage to bile ducts, perforation of organs,  transitory diarrhea, and the possibility that open cholecystectomy might  be required.  Informed consent was obtained.   GROSS OPERATIVE FINDINGS:  The patient had resolving inflammation of the  gallbladder, a small cystic duct which was not cannulated, small stones  palpated within the gallbladder.  Otherwise, the right upper quadrant  was grossly within normal limits.   TECHNIQUE:  The patient was placed in supine position after the adequate  administration of general anesthesia via endotracheal intubation.  Her  abdomen was prepped with Betadine solution and draped in usual manner.  Prior to this, a Foley catheter was aseptically inserted.  We then made  an incision in the superior aspect of the umbilicus through the skin and  subcutaneous tissue elevating the fascia with a sharp towel  clip and  inserting in Veress needle which was confirmed to be in position with a  saline drop test.  We then insufflated the abdomen with approximately  3.5 liters of CO2 and then placed an 11-mm cannula through this incision  using the Visiport technique.  We then placed an 11-mm cannula in the  epigastrium and two 5-mm cannulas in the right upper quadrant laterally.  The gallbladder was identified.  The adhesions were taken down.  The  cystic duct was clearly visualized, triply silver clipped on the side of  the common bile duct and singly silver clipped on the side of the  gallbladder and divided as was the cystic artery.  The gallbladder was  then removed with no spillage from the liver bed using the [facility] cautery  device.  After irrigating and checking for hemostasis, I elected to  leave a piece of Surgicel within the liver bed as well as a Al Pimple drain, which I had exited from the lateral port sites.  We then  desufflated the abdomen, irrigated the incisions, and I closed the  fascia and the epigastrium and the umbilicus with 0 Polysorb and closed  all  the incisions  with Dermabond and then sutured the drain in place with 3-  0 nylon.  Prior to closure, all sponge, needle, and instrument counts  were found to be correct.  Estimated blood loss was minimal.  The  patient tolerated the procedure well and was taken to recovery room in  satisfactory condition.      Barbaraann Barthel, M.D.  Electronically Signed     WB/MEDQ  D:  01/02/2009  T:  01/03/2009  Job:  [id number]

## 2011-03-31 NOTE — Discharge Summary (Signed)
Katrina Lambert, Katrina Lambert NO.:  [id number]   MEDICAL RECORD NO.:  [id number]          PATIENT TYPE:  INP   LOCATION:  9108                          FACILITY:  [facility]   PHYSICIAN:  Allie Bossier, MD        DATE OF BIRTH:  15-Feb-1986   DATE OF ADMISSION:  09/27/2008  DATE OF DISCHARGE:  09/30/2008                               DISCHARGE SUMMARY   DISCHARGE DIAGNOSES:  1. Status post low transverse cesarean section secondary to non-      reassuring fetal heart tones.  2. Status post epidural anesthesia.  3. Group B streptococcus positive, status post antibiotics during      labor.   PROCEDURES PERFORMED:  During this hospitalization include;  1. Low transverse C-section.  2. Epidural anesthesia.   ATTENDING PHYSICIAN AT DISCHARGE:  Lazaro Arms, M.D.   PRIMARY CARE PHYSICIAN:  [facility].   HISTORY OF PRESENT ILLNESS:  The patient is a 25 year old G2, P1 who  presented with premature rupture of membranes at 40 weeks and 3 days.   HOSPITAL COURSE:  This 25 year old G2, P51, arrived with premature  rupture of membranes on September 27, 2008.  She presented at 40 weeks  and 3 days without uterine contractions.  Her labor was induced because  of this with a Foley bulb and Pitocin.  She progressed well during her  labor, requiring penicillin for GBS positive status.  On September 28, 2008, the patient was noted to have a number of variable decelerations  noted on her fetal heart monitor.  These were watched for approximately  1-2 hours, however, after a prolonged decel, the patient consented to  the procedure and she was taken to the C-section.  Please see note  completed on September 28, 2008, dictated by Dr. Noel Gerold.  Due to the  arrest of descent and the non-reassuring fetal heart tones, the patient  underwent a low transverse C-section. She delivered a baby girl, 7  pounds.  EBL was approximately 600 mL.  There were no complications with  this  procedure.  Apgars of the child were 8 and 9 at 1 and 5 minutes.  Weight was 3195 g and 52 cm long.  Postoperatively, the patient is  breastfeeding with bottle supplementation, however, has chosen not to  use any form of birth control over 6 weeks despite medical advice to be  discharged with a form of birth control.  She requests an intrauterine  device replaced at her 6-week visit at [facility].  Pertinently, she  will have her staples removed at [facility] in approximately 3-4 days.  On the day of discharge, the patient was ambulating well, had bowel  movements, was urinating, was tolerating a general diet, and all vital  signs remained stable.  Discharge hemoglobin and hematocrit were  appropriate and no need for any iron supplementation was necessary.   PERTINENT LABORATORY DATA:  The patient is a positive, antibody  negative, hep B surface antigen negative, and rubella titer immune.  HIV negative, VDRL nonreactive, and GBS positive.  DISCHARGE DISPOSITION:  To home in stable condition.   FOLLOWUP APPOINTMENTS:  Will be with;  1. [facility] in 4 days for staple removal.  2. [facility] in approximately 6 weeks for postpartum visit, plan to      place IUD at this time.   DISCHARGE MEDICATIONS:  Include the following;  1. Percocet 5-325 mg, 1 tablet p.o. q.4 h. p.r.n. for pain, dispense      #40, no refills.  2. Ibuprofen 600 mg to take 1 tablet by mouth q.6 h. as needed for      postpartum pain.  3. Colace 100 mg to take 1 tablet by mouth twice daily as needed for      postpartum constipation.  4. Prenatal vitamins take 1 tablet daily while breastfeeding.   DISCHARGE DIET:  Will be a regular diet, however, due to the patient's  obesity recommended a low-fat, low-salt diet.   DISCHARGE ACTIVITIES:  Pelvic arrest for 6 weeks, no heavy lifting  during this time as well.   FOLLOWUP INSTRUCTIONS:  The patient is to follow up if she noticed  increased vaginal bleeding,  temperature greater than 100.4, any acute  purulent, tenderness, or abnormalities noted around the incision site.     ______________________________  Obstetrics Resident      Allie Bossier, MD  Electronically Signed    OR/MEDQ  D:  09/30/2008  T:  10/01/2008  Job:  [id number]   cc:   Allie Bossier, MD   Tilda Burrow, M.D.  Fax: [phone]

## 2011-03-31 NOTE — Op Note (Signed)
[name], [name] NO.:  [id number]   MEDICAL RECORD NO.:  [id number]          PATIENT TYPE:  INP   LOCATION:  9167                          FACILITY:  [facility]   PHYSICIAN:  Katrina Bossier, MD        DATE OF BIRTH:  01-09-1986   DATE OF PROCEDURE:  09/28/2008  DATE OF DISCHARGE:                               OPERATIVE REPORT   PREOPERATIVE DIAGNOSES:  1. Arrest of dilatation and descent.  2. Obesity.  3. Failed trial labor after cesarean section.   POSTOPERATIVE DIAGNOSES:  1. Arrest of dilatation and descent.  2. Obesity.  3. Failed trial labor after cesarean section.   PROCEDURE:  Repeat low-transverse cesarean section.   SURGEON:  Katrina Bossier, MD   ASSISTANT:  Katrina Sera, DO.   ANESTHESIA:  Epidural and local.   REASON FOR PROCEDURE:  [name] is a 25 year old gravida 2, para 1-  0-0-1 at term who was admitted for spontaneous rupture of membranes.  She had a Foley bulb for cervical ripening.  After the Foley bulb fell  out, she was started on Pitocin.  She dilated to 5 cm despite a number  of hours with Pitocin creating adequate contractions, the cervix failed  to dilate any further, and the baby would not descend more than -1  station.  In addition to that, on a fetal heart tracing, she began to  have some repetitive variable decelerations and then ultimately had a  prolonged 6-minute deceleration into the 70s.  The patient was counseled  on the risks and benefits of a repeat cesarean section to include, but  not limited to bleeding, infection, damage to internal organs and the  patient agreed with those risks and agreed to proceed with cesarean  section.   DESCRIPTION OF PROCEDURE:  The patient was taken into the operating room  and prepped and draped in usual sterile fashion.  The patient was placed  in the left dorsal supine position.  Time-out was conducted.  Appropriate anesthesia was confirmed.  A low-transverse Pfannenstiel  incision  was made in the skin and extended down to the fascia.  The  fascia was incised in the midline and the fascial incision was extended  laterally using the Mayo scissors.  Moderate adhesive disease was noted  upon dissection of the fascial layer.  The rectus muscles were incised  in the midline using electrocautery.  The rectus muscles were spread  with manual traction and then they were dissected transversely  approximately one-third of way across at the midline on each side.  The  peritoneum was then entered first bluntly and then using a hemostat.  The peritoneal opening was extended with manual traction and an adequate  opening to the uterus was obtained.  Bladder blade was placed and the  lower uterine segment was noted to be quite thin.  The transverse  incision was made in the lower uterine segment with the scalpel and  extended down through the layers of myometrium.  The last layer of the  myometrium was penetrated bluntly with finger  and the uterine incision  was extended laterally with manual traction.  Bulging membranes were  ruptured and a large amount of clear amniotic fluid was noted.  The  baby's head was easily grasped and raised out of the pelvis and  delivered to the uterine incision without difficulty.  The mouth and  nares were bulb suctioned and the shoulders were delivered and rest of  corpus without any difficulty.  Baby was bulb suctioned again.  The cord  was clamped and cut and the infant was handed to the waiting NICU team.  The placenta was manually extracted and the uterus was noted to be tone  after some fundal massage.  The uterine incision was closed with zero  chromic in a running interlocking fashion.  A figure-of-eight suture was  added to the left corner of the uterine incision to obtain good  hemostasis.  Good hemostasis was noted.  Significant adhesive disease  from the omentum was noted on the anterior aspect of the uterus.  The  muscles and peritoneal  layer were inspected and found to be hemostatic.  The fascia was then closed with 0 PDS loop suture in a running non-  interlocking fashion.  Good hemostasis was noted.  No defects were  noted.  The subcutaneous tissue was infiltrated with 0.5% Marcaine 15 mL  in the superior aspect, 15 mL in the inferior aspect of the incision.  The skin was closed with staples and a sterile dressing was applied.   FINDINGS:  1. Viable female infant 7 pounds 0 ounces.  2. Clear amniotic fluid.   SPECIMENS:  1. Placenta intact to labor and delivery.  2. Cord blood to the lab.   ESTIMATED BLOOD LOSS:  600 mL.   COMPLICATIONS:  None immediate.   The patient was taken to the PACU in good condition.      Katrina Sera, DO  Electronically Signed     ______________________________  Katrina Bossier, MD    MC/MEDQ  D:  09/28/2008  T:  09/28/2008  Job:  [id number]

## 2011-04-03 NOTE — Consult Note (Signed)
Katrina Lambert, [place]                  ACCOUNT NO.:  [id number]   MEDICAL RECORD NO.:  [id number]          PATIENT TYPE:  OIB   LOCATION:  A415                          FACILITY:  [facility]   PHYSICIAN:  Tilda Burrow, M.D. DATE OF BIRTH:  March 06, 1986   DATE OF CONSULTATION:  01/18/2006  DATE OF DISCHARGE:                                   CONSULTATION   CHIEF COMPLAINT:  Abdominal pain, rule out labor, pregnancy [redacted] weeks  gestation.   HISTORY OF PRESENT ILLNESS:  This 25 year old gravida 2, para 0, AB 1, now  at 34 weeks followed through the practice of Dr. Almetta Lovely since  September with pregnancy course uneventful. She has had some abdominal  discomfort beginning a couple of hours after going to work at [employer]  this afternoon, where she works three 6-hour shifts per week. She presented  to [facility] labor and delivery due to recent relocation of  residence to [place]. She presents with no bleeding or gush of fluid.  External monitoring is done.   The patient's vital signs include temperature of 98, pulse 104, blood  pressure completely normal, respirations 20 and unlabored. External  monitoring shows no contractions. Cervical exam shows the cervix to be  fingertip dilated, external os, closed internal os per R.N. Presenting part  not determinable at this time.   After observation for one and one and a half hours, the patient began to  spontaneously feel better. She is eating well and at 9 p.m. is considered  completely normal, nontender abdomen, palpably nontender.   IMPRESSION:  Nonspecific abdominal discomfort of pregnancy. No evidence of  pregnancy complication.   PLAN:  Continued care through Dr. Gilford Silvius. Follow in one week or p.r.n.  problems.      Tilda Burrow, M.D.  Electronically Signed     JVF/MEDQ  D:  01/18/2006  T:  01/19/2006  Job:  [id number]   cc:   Almetta Lovely  Fax: [phone]   [facility]

## 2011-08-18 LAB — CBC
HCT: 30.9 — ABNORMAL LOW
HCT: 33.2 — ABNORMAL LOW
HCT: 40.4
Hemoglobin: 10.4 — ABNORMAL LOW
Hemoglobin: 13.5
MCHC: 33.4
MCV: 79.1
Platelets: 281
RBC: 4.11
RBC: 5.1
RDW: 14.2
WBC: 19 — ABNORMAL HIGH

## 2011-08-18 LAB — GLUCOSE, CAPILLARY: Glucose-Capillary: 83

## 2011-08-20 LAB — URINE MICROSCOPIC-ADD ON

## 2011-08-20 LAB — CBC
HCT: 36.3 % (ref 36.0–46.0)
Hemoglobin: 12 g/dL (ref 12.0–15.0)
MCHC: 33.2 g/dL (ref 30.0–36.0)
RDW: 15 % (ref 11.5–15.5)

## 2011-08-20 LAB — PREGNANCY, URINE: Preg Test, Ur: NEGATIVE

## 2011-08-20 LAB — COMPREHENSIVE METABOLIC PANEL
BUN: 9 mg/dL (ref 6–23)
Calcium: 9.1 mg/dL (ref 8.4–10.5)
Glucose, Bld: 91 mg/dL (ref 70–99)
Total Protein: 6.7 g/dL (ref 6.0–8.3)

## 2011-08-20 LAB — POCT CARDIAC MARKERS: CKMB, poc: <1 ng/mL — ABNORMAL LOW (ref 1.0–8.0)

## 2011-08-20 LAB — DIFFERENTIAL
Lymphs Abs: 2.9 10*3/uL (ref 0.7–4.0)
Monocytes Relative: 6 % (ref 3–12)
Neutro Abs: 11.2 10*3/uL — ABNORMAL HIGH (ref 1.7–7.7)
Neutrophils Relative %: 72 % (ref 43–77)

## 2011-08-20 LAB — URINALYSIS, ROUTINE W REFLEX MICROSCOPIC
Ketones, ur: NEGATIVE mg/dL
Leukocytes, UA: NEGATIVE
Nitrite: NEGATIVE
Specific Gravity, Urine: >1.03 — ABNORMAL HIGH (ref 1.005–1.030)
pH: 5.5 (ref 5.0–8.0)

## 2019-09-25 ENCOUNTER — Emergency Department (HOSPITAL_COMMUNITY)
Admission: EM | Admit: 2019-09-25 | Discharge: 2019-09-25 | Disposition: A | Discharge status: Home / Self Care | Payer: Medicaid Other | Attending: Emergency Medicine | Admitting: Emergency Medicine

## 2019-09-25 ENCOUNTER — Other Ambulatory Visit: Payer: Self-pay

## 2019-09-25 ENCOUNTER — Encounter (HOSPITAL_COMMUNITY): Payer: Self-pay | Admitting: Emergency Medicine

## 2019-09-25 DIAGNOSIS — Z5321 Procedure and treatment not carried out due to patient leaving prior to being seen by health care provider: Secondary | ICD-10-CM | POA: Insufficient documentation

## 2019-09-25 DIAGNOSIS — L089 Local infection of the skin and subcutaneous tissue, unspecified: Secondary | ICD-10-CM | POA: Diagnosis not present

## 2019-09-25 HISTORY — DX: Depression, unspecified: F32.A

## 2019-09-25 HISTORY — DX: Anxiety disorder, unspecified: F41.9

## 2019-09-25 HISTORY — DX: Essential (primary) hypertension: I10

## 2019-09-25 NOTE — ED Triage Notes (Signed)
Pt states that she had a tattoo done a week ago. Pt states that 4 days ago she was helping someone move and a box slid down her leg across her tattoo causing chunks of the tattoo to come off. Tattoo has drainage and is red around it.

## 2019-09-25 NOTE — ED Notes (Signed)
No answer in waiting room 

## 2021-01-29 ENCOUNTER — Other Ambulatory Visit: Payer: Self-pay | Admitting: Neurological Surgery

## 2021-01-29 DIAGNOSIS — M4316 Spondylolisthesis, lumbar region: Secondary | ICD-10-CM

## 2021-01-29 DIAGNOSIS — M4310 Spondylolisthesis, site unspecified: Secondary | ICD-10-CM

## 2021-01-29 DIAGNOSIS — M5412 Radiculopathy, cervical region: Secondary | ICD-10-CM

## 2021-02-22 ENCOUNTER — Ambulatory Visit
Admission: RE | Admit: 2021-02-22 | Discharge: 2021-02-22 | Disposition: A | Discharge status: Home / Self Care | Payer: Medicaid Other | Source: Ambulatory Visit | Attending: Neurological Surgery | Admitting: Neurological Surgery

## 2021-02-22 DIAGNOSIS — M4316 Spondylolisthesis, lumbar region: Secondary | ICD-10-CM

## 2021-02-22 DIAGNOSIS — M4310 Spondylolisthesis, site unspecified: Secondary | ICD-10-CM

## 2021-02-22 DIAGNOSIS — M5412 Radiculopathy, cervical region: Secondary | ICD-10-CM

## 2021-02-22 IMAGING — CT CT L SPINE W/O CM
1 of 5 series · 6 of 14 positions shown, 8 images · non-contrast
Comparison: None.

CLINICAL DATA: Back pain and left lower extremity numbness

EXAM:
CT LUMBAR SPINE WITHOUT CONTRAST
TECHNIQUE: Multidetector CT imaging of the lumbar spine was performed without
intravenous contrast administration. Multiplanar CT image
reconstructions were also generated.

[Series 3: l spine soft · axial · 0.30mm/px · z∈[-292,-134]mm · 6 of 111 slices shown, 8 images]
[im 16/111  soft-tissue]
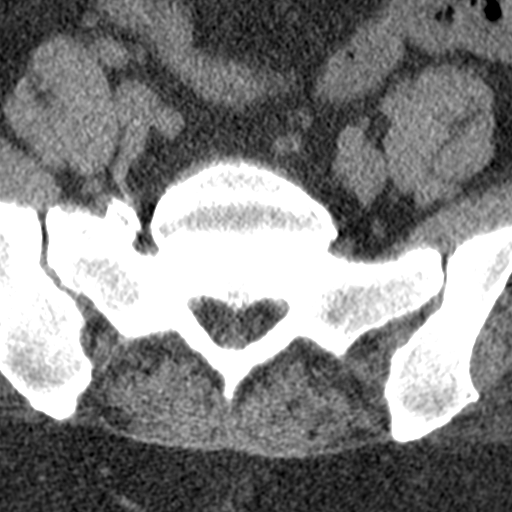
[im 16/111  bone]
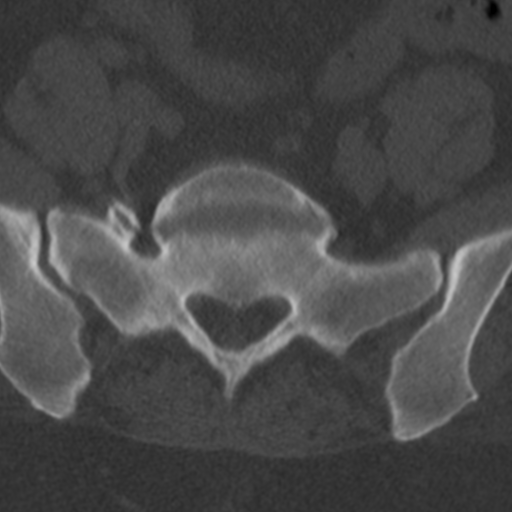
[im 32/111  bone]
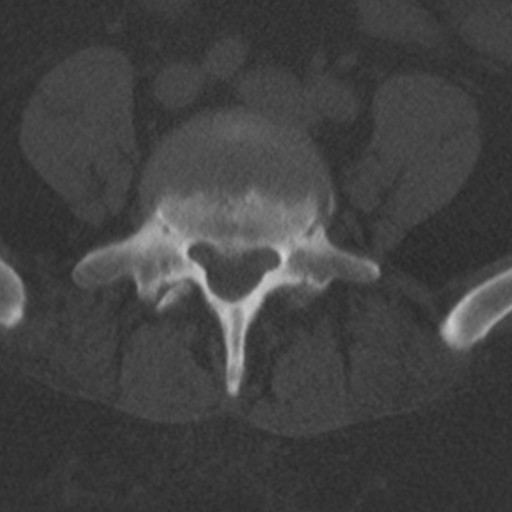
[im 48/111  bone]
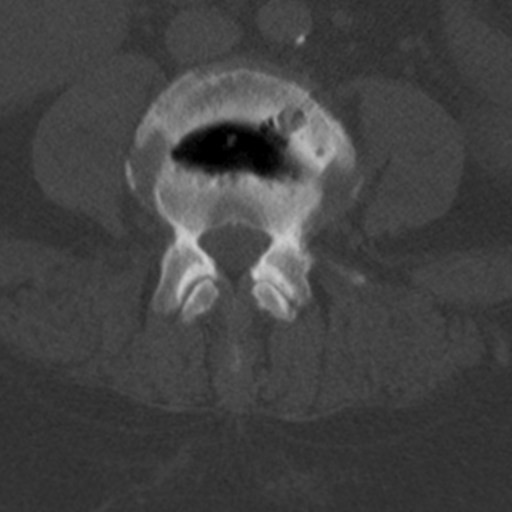
[im 63/111  bone]
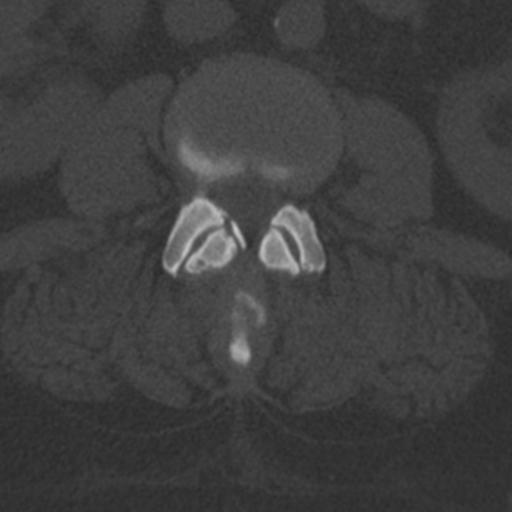
[im 79/111  soft-tissue]
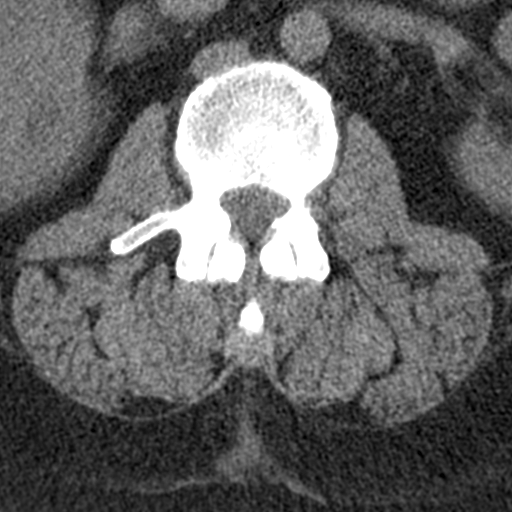
[im 79/111  bone]
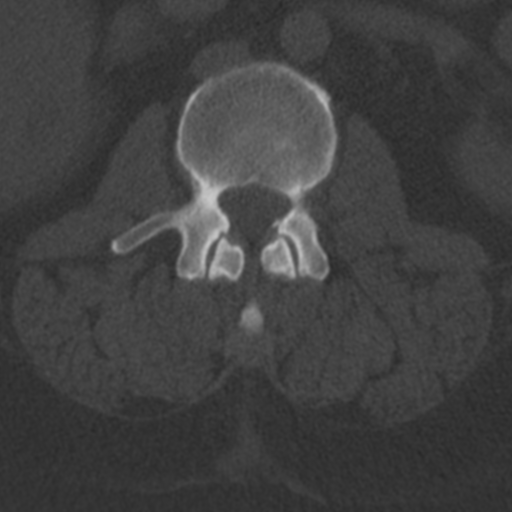
[im 95/111  bone]
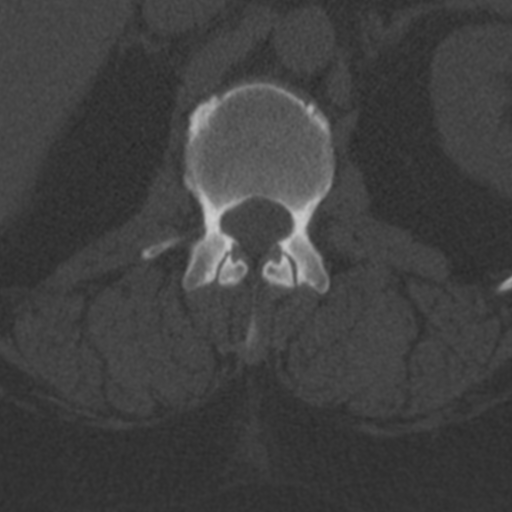

[6 of 14 positions shown; findings below may reference images not displayed]

FINDINGS: Segmentation: Transitional lumbosacral anatomy with right-sided
assimilation joint of L5.

Alignment: Grade 2 anterolisthesis at L3-4 due to bilateral L3 pars
interarticularis defects.

Vertebrae: Endplate remodeling at L3 and L4.

Paraspinal and other soft tissues: Negative.

Disc levels:

T12-L1: Right subarticular recess stenosis due to partially
calcified disc bulge.

L1-2: Unremarkable

L2-3: Facet hypertrophy without stenosis.

L3-4: Grade 2 anterolisthesis as above. Mild spinal canal stenosis.
Severe bilateral foraminal stenosis.

L4-5: Small disc bulge. Mild spinal canal stenosis. No foraminal
stenosis.

L5-S1: No stenosis.
IMPRESSION: 1. Grade 2 anterolisthesis at L3-4 due to bilateral L3 pars
interarticularis defects. Mild spinal canal stenosis and severe
bilateral foraminal stenosis.
2. Right subarticular recess stenosis at T12-L1, due to partially
calcified disc bulge.
3. Mild spinal canal stenosis at L4-5.
4. Transitional lumbosacral anatomy with right-sided assimilation
joint of L5.

## 2021-02-22 IMAGING — MR MR LUMBAR SPINE W/O CM
4 of 5 series · 23 of 48 positions shown · non-contrast
Comparison: None.

CLINICAL DATA: Left leg pain

EXAM:
MRI LUMBAR SPINE WITHOUT CONTRAST
TECHNIQUE: Multiplanar, multisequence MR imaging of the lumbar spine was
performed. No intravenous contrast was administered.

[Series 10: T2 · sagittal · 4.0mm · 0.94mm/px · 6 of 17 slices shown (1 of 2)]
[im 1/17]
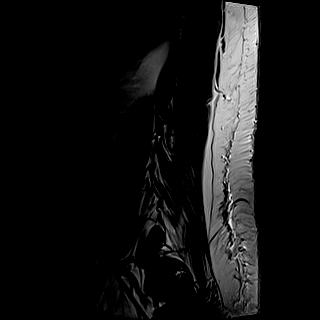
[im 4/17]
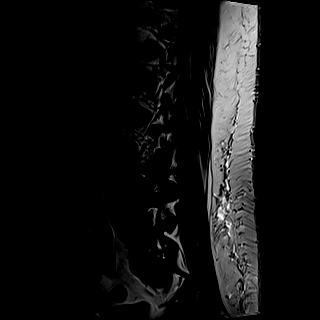
[im 7/17]
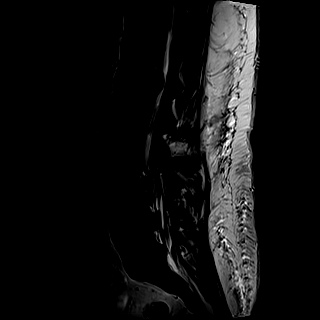
[im 10/17]
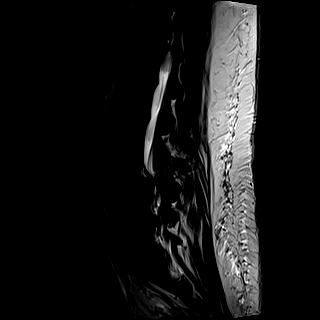
[im 13/17]
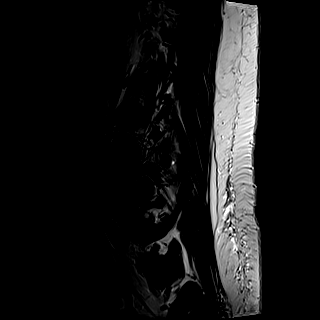
[im 17/17]
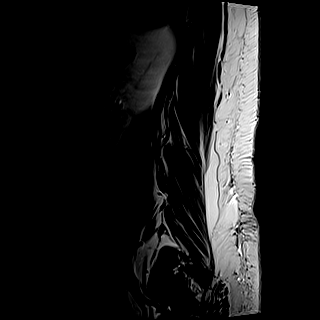

[Series 11: T1 · sagittal · 4.0mm · 1.13mm/px · 4 of 17 slices shown (1 of 2)]
[im 1/17]
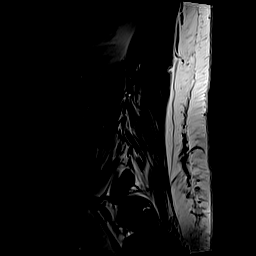
[im 5/17]
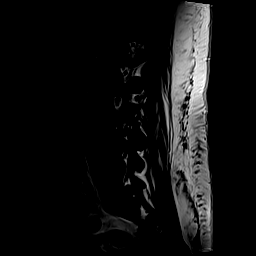
[im 9/17]
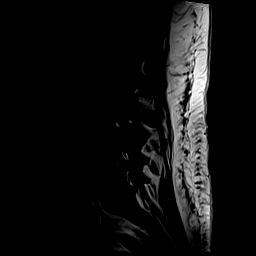
[im 17/17]
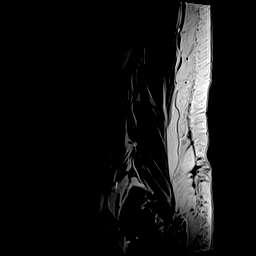

[Series 15: T1 · axial · 4.0mm · 0.28mm/px · z∈[-230,-23]mm · 3 of 53 slices shown (2 of 2)]
[im 7/53]
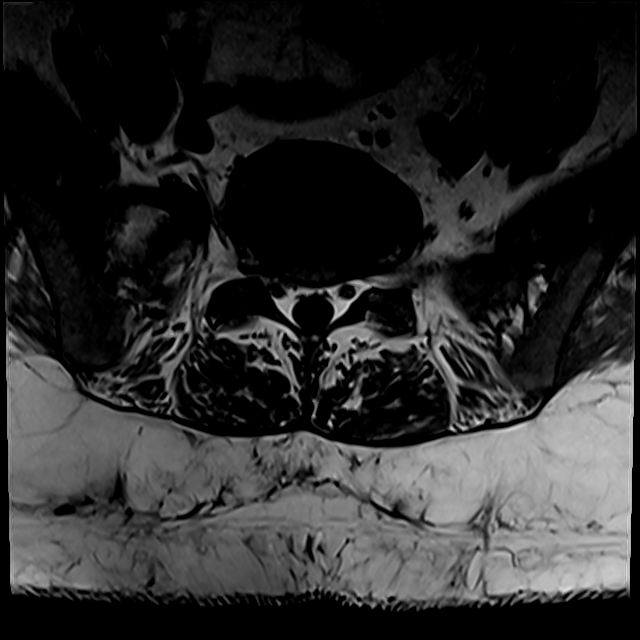
[im 28/53]
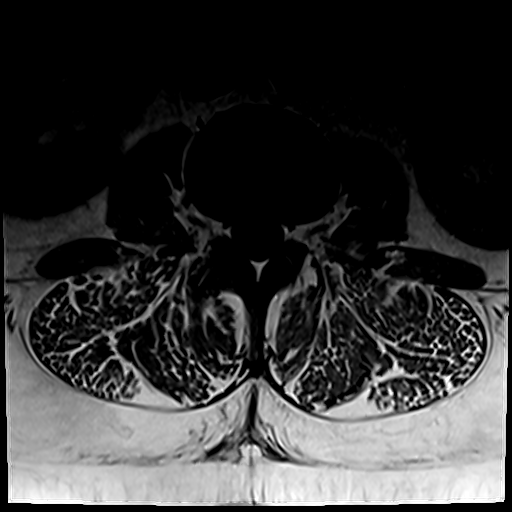
[im 46/53]
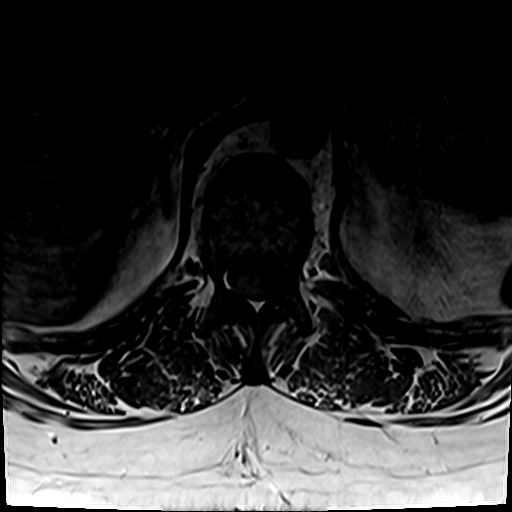

[Series 18: T2 · axial · 4.0mm · 0.28mm/px · z∈[-245,+12]mm · 10 of 53 slices shown (2 of 2)]
[im 4/53]
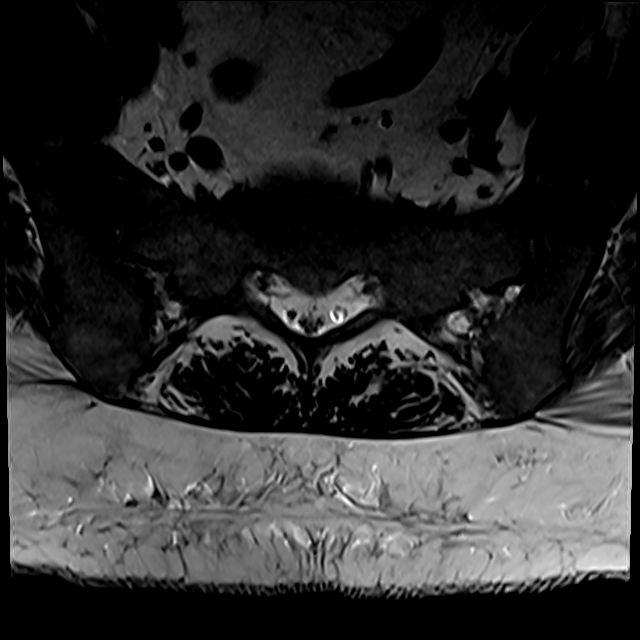
[im 7/53]
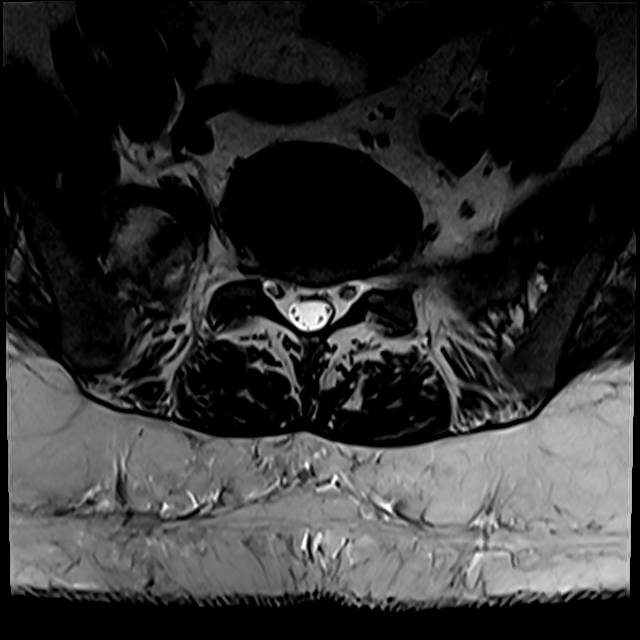
[im 11/53]
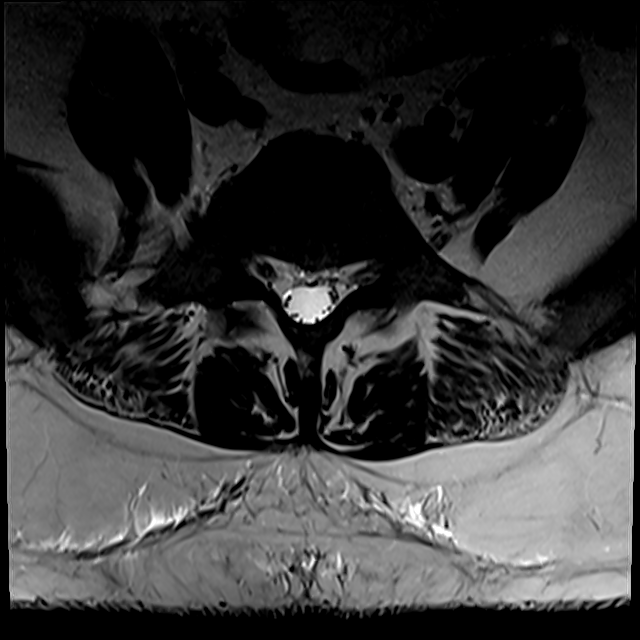
[im 18/53]
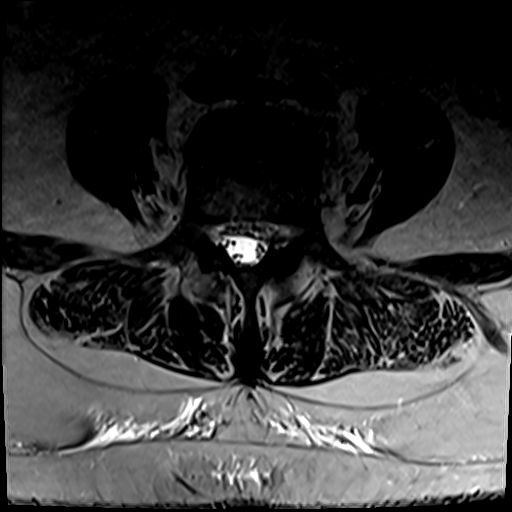
[im 25/53]
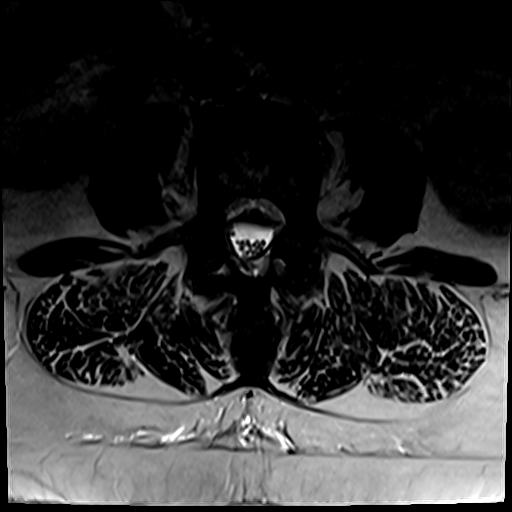
[im 28/53]
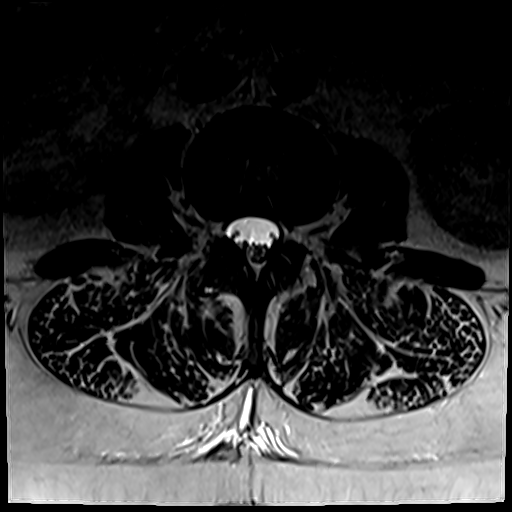
[im 32/53]
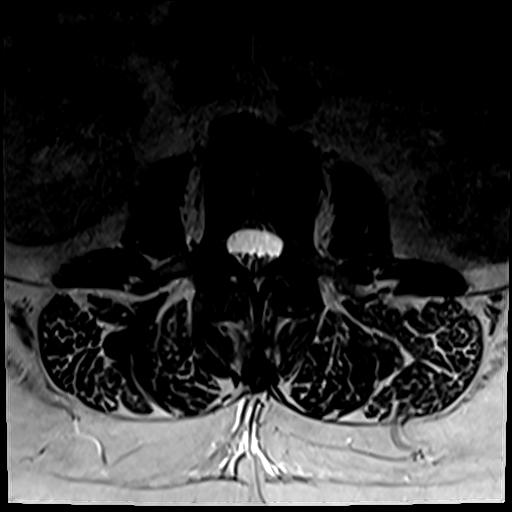
[im 39/53]
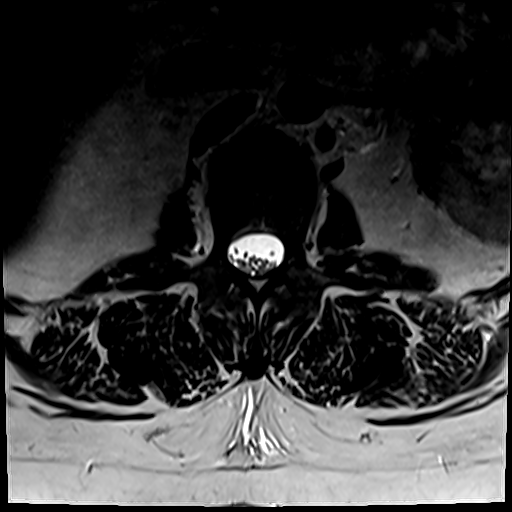
[im 46/53]
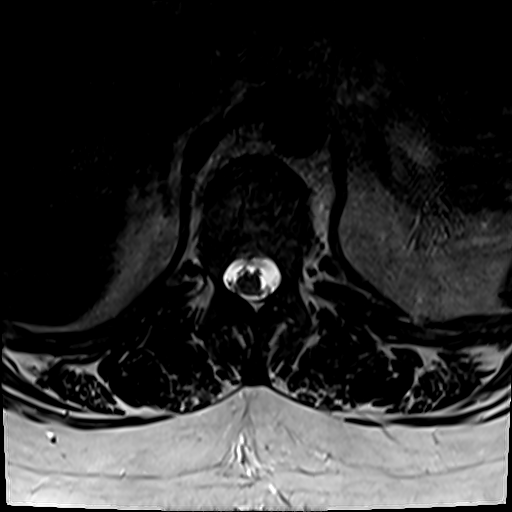
[im 53/53]
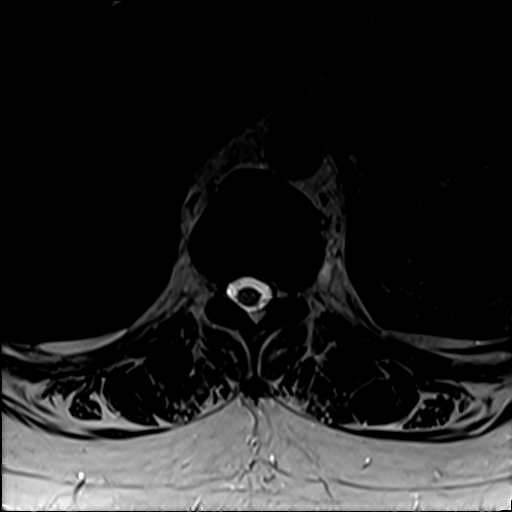

[23 of 48 positions shown; findings below may reference images not displayed]

FINDINGS: Segmentation: Transitional lumbosacral anatomy with right-sided
assimilation joint at L5.

Alignment: Grade 2 anterolisthesis at L3-4 due to bilateral L3 pars
defects.

Vertebrae:  Type 2 Modic endplate signal changes at L3-4.

Conus medullaris and cauda equina: Conus extends to the L1 level.
Conus and cauda equina appear normal.

Paraspinal and other soft tissues: Negative

Disc levels:

T11-12: Left subarticular disc extrusion with minimal superior
migration contacting the left ventral spinal cord. Mild spinal canal
stenosis. No neural foraminal stenosis.

T12-L1: Right subarticular disc protrusion severely narrowing the
right lateral recess. No central spinal canal stenosis or neural
foraminal stenosis.

L1-L2: Normal disc space and facet joints. No spinal canal stenosis.
No neural foraminal stenosis.

L2-L3: Normal disc space and facet joints. No spinal canal stenosis.
No neural foraminal stenosis.

L3-L4: Disc uncovering and small extrusion with superior migration.
Grade 2 anterolisthesis. Left lateral recess narrowing without
central spinal canal stenosis. Severe bilateral neural foraminal
stenosis.

L4-L5: Small central disc protrusion. Narrowing of both lateral
recesses without central spinal canal stenosis. No neural foraminal
stenosis.

L5-S1: Normal disc space and facet joints. No spinal canal stenosis.
No neural foraminal stenosis.

Visualized sacrum: Normal.
IMPRESSION: 1. Transitional lumbosacral anatomy with right-sided assimilation
joint at L5.
2. Grade 2 anterolisthesis at L3-4 due to bilateral L3 pars defects.
There is severe bilateral neural foraminal stenosis at this level.
3. Left subarticular disc extrusion at T11-T12 with minimal superior
migration contacting the left ventral spinal cord and causing mild
spinal canal stenosis.
4. Right subarticular disc protrusion at T12-L1 severely narrowing
the right lateral recess. Correlate for right L1 radiculopathy.
5. Small central disc protrusion at L4-L5 narrowing both lateral
recesses.

## 2021-02-22 IMAGING — MR MR CERVICAL SPINE W/O CM
5 series · 37 of 48 positions shown · non-contrast
Comparison: None.

CLINICAL DATA: Neck pain

EXAM:
MRI CERVICAL SPINE WITHOUT CONTRAST
TECHNIQUE: Multiplanar, multisequence MR imaging of the cervical spine was
performed. No intravenous contrast was administered.

[Series 5: T2 · sagittal · 3.0mm · 0.82mm/px · 6 of 17 slices shown (1 of 2)]
[im 1/17]
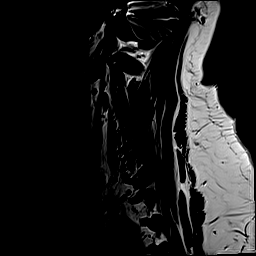
[im 4/17]
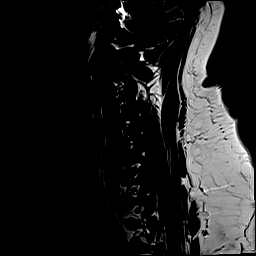
[im 7/17]
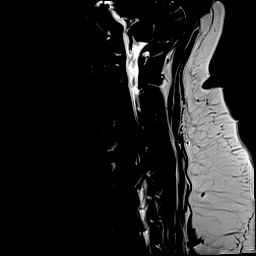
[im 10/17]
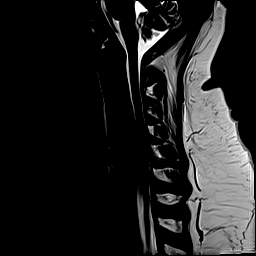
[im 13/17]
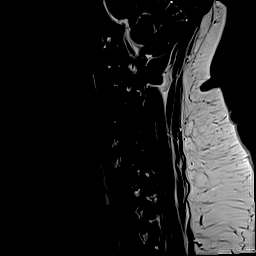
[im 17/17]
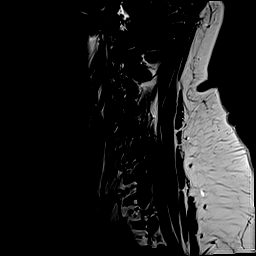

[Series 6: T1 · sagittal · 3.0mm · 0.82mm/px · 7 of 17 slices shown]
[im 1/17]
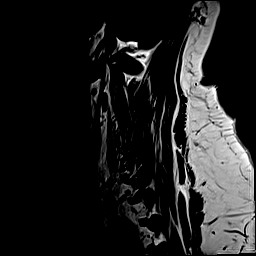
[im 3/17]
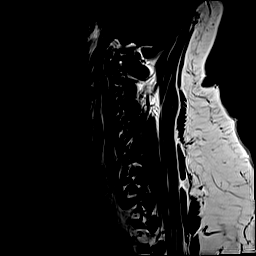
[im 6/17]
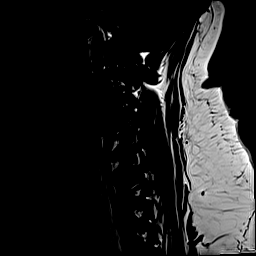
[im 9/17]
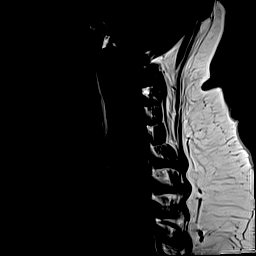
[im 11/17]
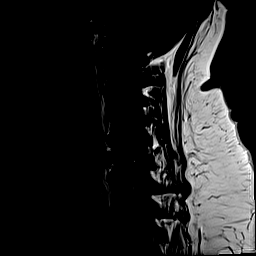
[im 14/17]
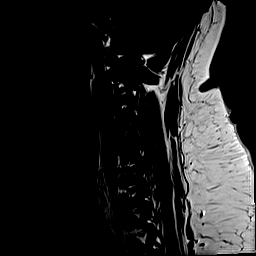
[im 17/17]
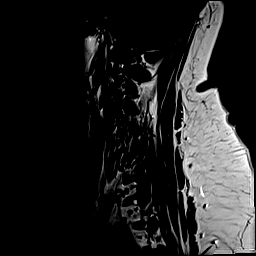

[Series 7: STIR · sagittal · 3.0mm · 0.41mm/px · 7 of 17 slices shown]
[im 1/17]
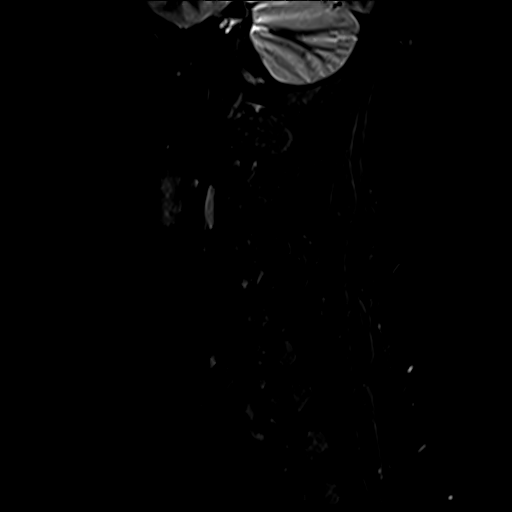
[im 3/17]
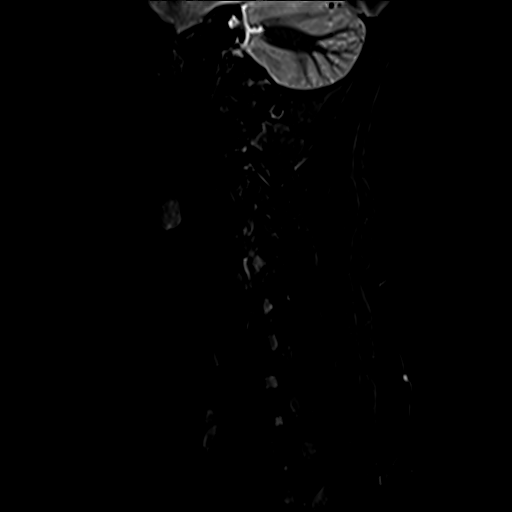
[im 6/17]
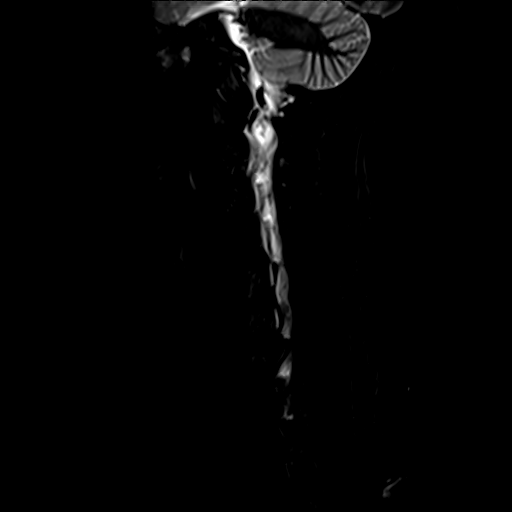
[im 9/17]
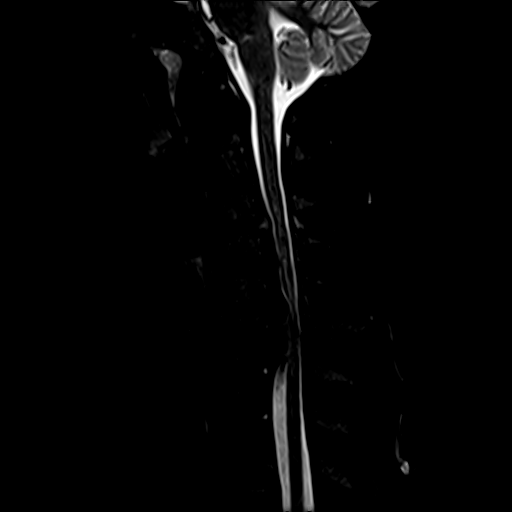
[im 11/17]
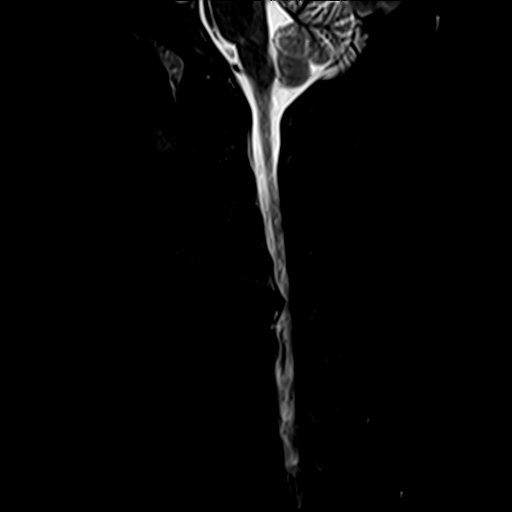
[im 14/17]
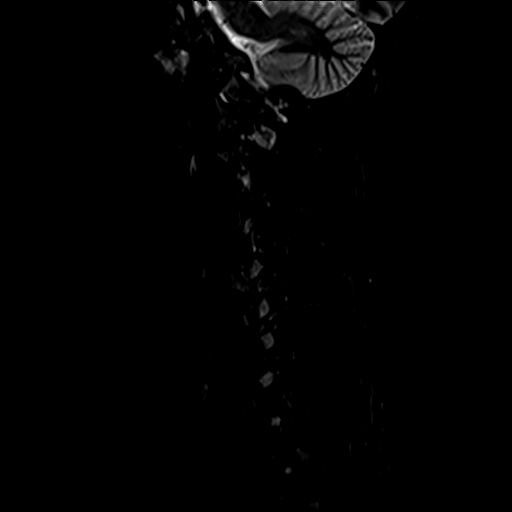
[im 17/17]
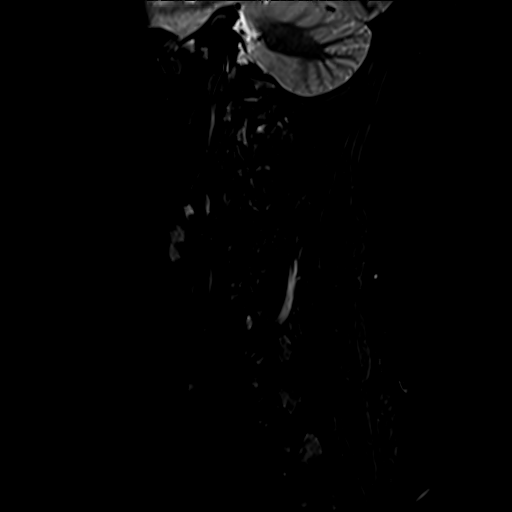

[Series 8: T2 · axial · 3.0mm · 0.62mm/px · z∈[-79,+26]mm · 9 of 34 slices shown (2 of 2)]
[im 1/34]
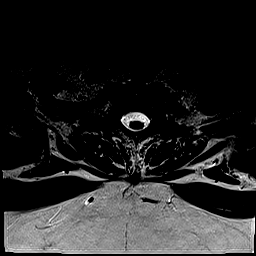
[im 3/34]
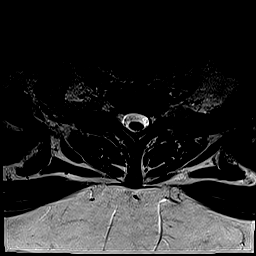
[im 6/34]
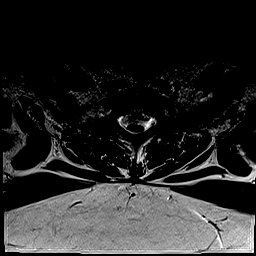
[im 11/34]
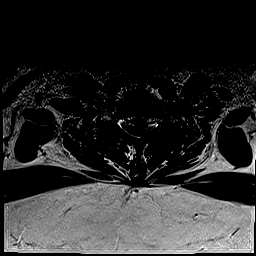
[im 16/34]
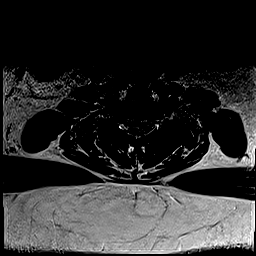
[im 18/34]
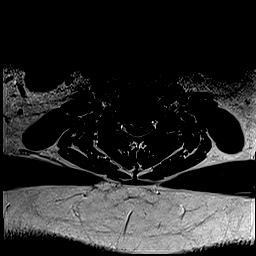
[im 23/34]
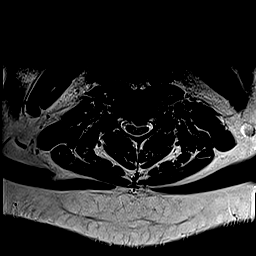
[im 28/34]
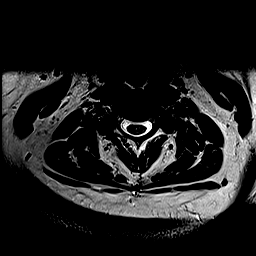
[im 34/34]
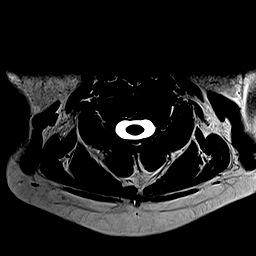

[Series 9: GRE · axial · 3.0mm · 0.42mm/px · z∈[-79,+26]mm · 8 of 34 slices shown]
[im 1/34]
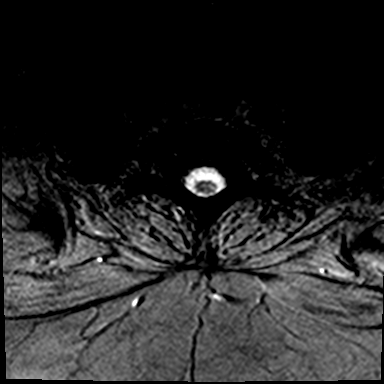
[im 6/34]
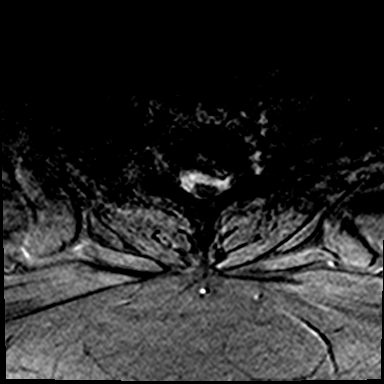
[im 11/34]
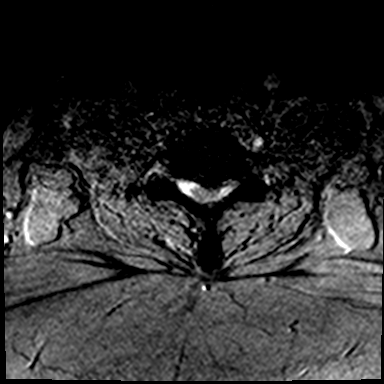
[im 16/34]
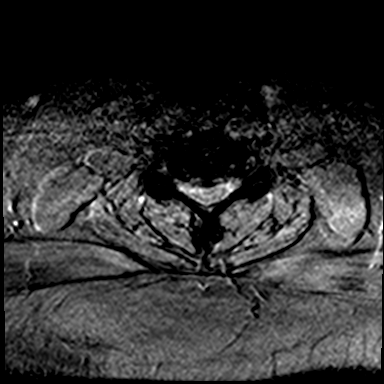
[im 18/34]
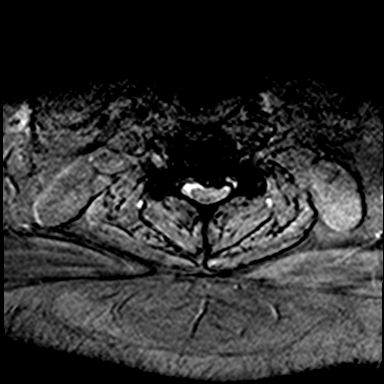
[im 23/34]
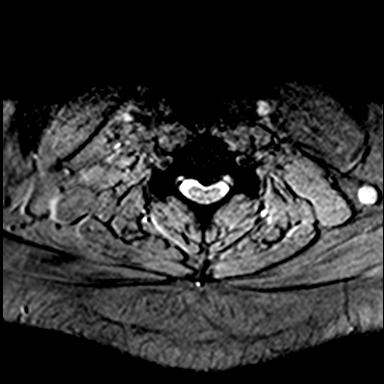
[im 28/34]
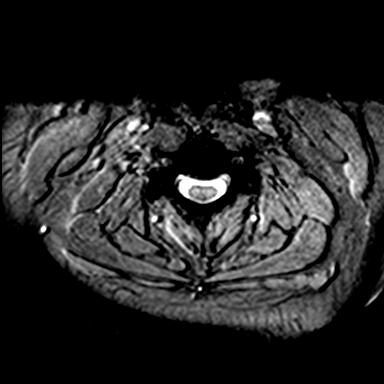
[im 34/34]
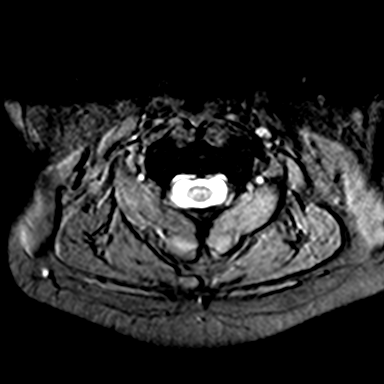

[37 of 48 positions shown; findings below may reference images not displayed]

FINDINGS: Alignment: There is straightening of the normal cervical lordosis.

Vertebrae: The vertebral body heights are well maintained. No
fracture, marrow edema,or pathologic marrow infiltration.

Cord: Normal signal and morphology.

Posterior Fossa, vertebral arteries, paraspinal tissues:

The visualized portion of the posterior fossa is unremarkable.
Normal flow voids seen within the vertebral arteries. The paraspinal
soft tissues are unremarkable.

Disc levels:

C1-C2: Atlanto-axial junction is normal, without canal narrowing

C2-C3: No significant spinal canal or neural foraminal narrowing

C3-C4: There is a left paracentral disc protrusion which flattens
the anterior thecal sac with uncovertebral osteophytes causing mild
bilateral neural foraminal narrowing. There is mild central canal
stenosis.

C4-C5: There is a left paracentral disc protrusion which contacts
the left C5 nerve root without impingement in flattens anterior
thecal sac. Uncovertebral osteophytes are present which causes
severe right and moderate left neural foraminal narrowing. There is
moderate central canal stenosis.

C5-C6: There is a large right lateral recess disc protrusion
measuring 5 mm in AP dimension which contacts and impinges the right
C6 nerve root. There is uncovertebral osteophytes and severe
bilateral neural foraminal narrowing, right greater than left. There
is severe central canal stenosis with flattening of the cord.

C6-C7: There is a large left lateral recess disc protrusion
measuring 6 mm in AP dimension which contacts and impinges the left
C7 nerve root. Uncovertebral osteophytes are present with severe
left and moderate right neural foraminal narrowing as well as severe
central canal stenosis which flattens the anterior cord.

C7-T1: No significant spinal canal or neural foraminal narrowing
IMPRESSION: Straightening of the normal cervical lordosis.

Cervical spine spondylosis most notable at C5-C6 with a large right
lateral recess disc protrusion contacting and impinging the right C6
nerve root with severe bilateral neural foraminal narrowing, right
greater than left and severe central canal stenosis. Also at C6-C7
with a large left lateral recess disc protrusion contacting and
pinching the left C7 nerve root with severe left neural foraminal
narrowing and severe central canal stenosis.

## 2021-03-05 ENCOUNTER — Other Ambulatory Visit (HOSPITAL_COMMUNITY): Payer: Self-pay | Admitting: Neurological Surgery

## 2021-03-05 DIAGNOSIS — M4802 Spinal stenosis, cervical region: Secondary | ICD-10-CM

## 2021-03-05 DIAGNOSIS — G992 Myelopathy in diseases classified elsewhere: Secondary | ICD-10-CM

## 2021-03-07 ENCOUNTER — Ambulatory Visit (HOSPITAL_COMMUNITY)
Admission: RE | Admit: 2021-03-07 | Discharge: 2021-03-07 | Disposition: A | Discharge status: Home / Self Care | Payer: Medicaid Other | Source: Ambulatory Visit | Attending: Neurological Surgery | Admitting: Neurological Surgery

## 2021-03-07 ENCOUNTER — Other Ambulatory Visit: Payer: Self-pay

## 2021-03-07 DIAGNOSIS — G992 Myelopathy in diseases classified elsewhere: Secondary | ICD-10-CM | POA: Diagnosis present

## 2021-03-07 DIAGNOSIS — M4802 Spinal stenosis, cervical region: Secondary | ICD-10-CM | POA: Insufficient documentation

## 2021-03-07 IMAGING — CT CT CERVICAL SPINE W/O CM
3 of 4 series · 13 of 35 positions shown, 16 images · non-contrast
Comparison: Cervical MRI [DATE]

CLINICAL DATA: Bilateral arm and leg numbness for over a year,
gradually worsening

EXAM:
CT CERVICAL SPINE WITHOUT CONTRAST
TECHNIQUE: Multidetector CT imaging of the cervical spine was performed without
intravenous contrast. Multiplanar CT image reconstructions were also
generated.

[Series 3: sag bone · sagittal · 0.26mm/px · 5 of 61 slices shown, 6 images]
[im 21/61  bone]
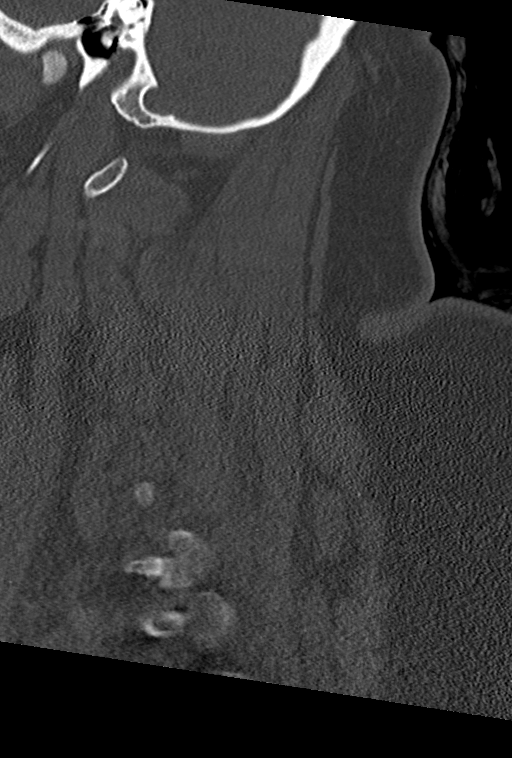
[im 26/61  bone]
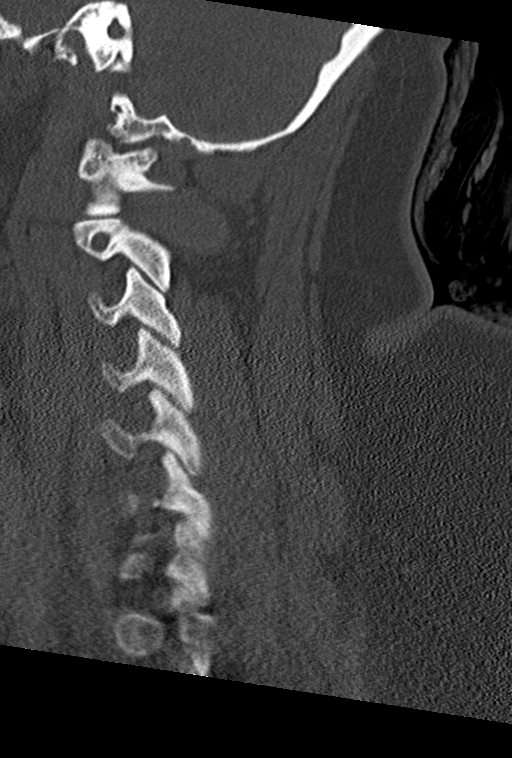
[im 31/61  soft-tissue]
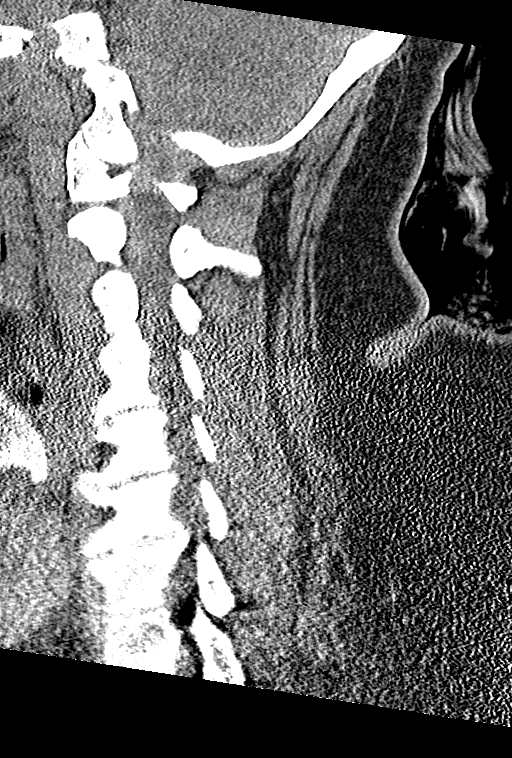
[im 31/61  bone]
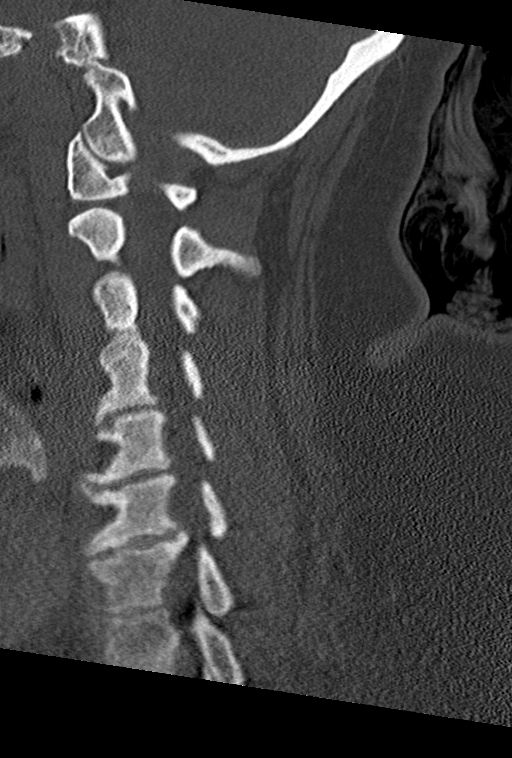
[im 36/61  bone]
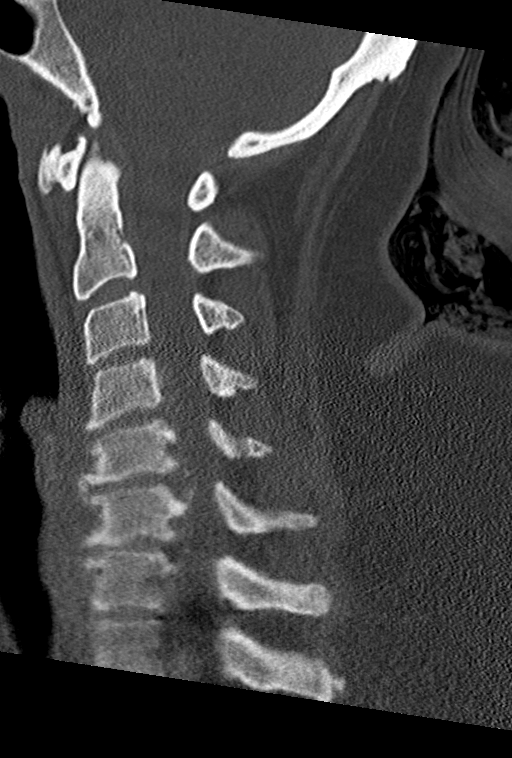
[im 41/61  bone]
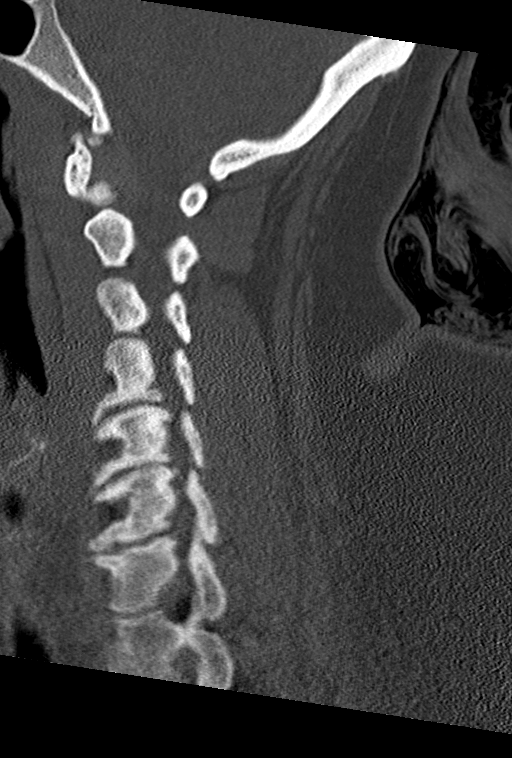

[Series 6: cor bone · coronal · 0.30mm/px · 3 of 66 slices shown]
[im 14/66  bone]
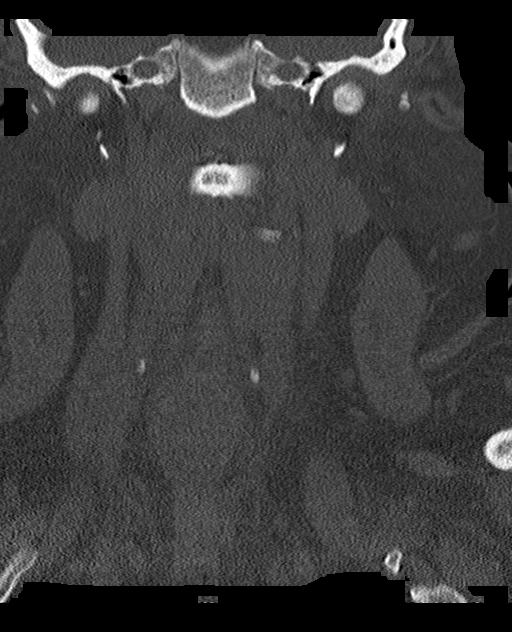
[im 27/66  bone]
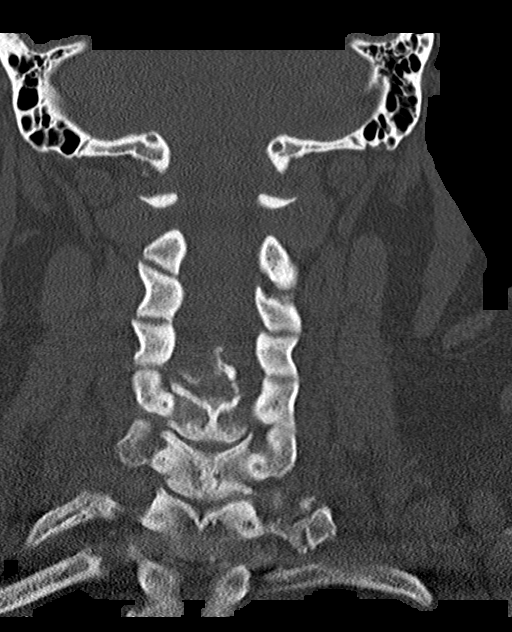
[im 40/66  bone]
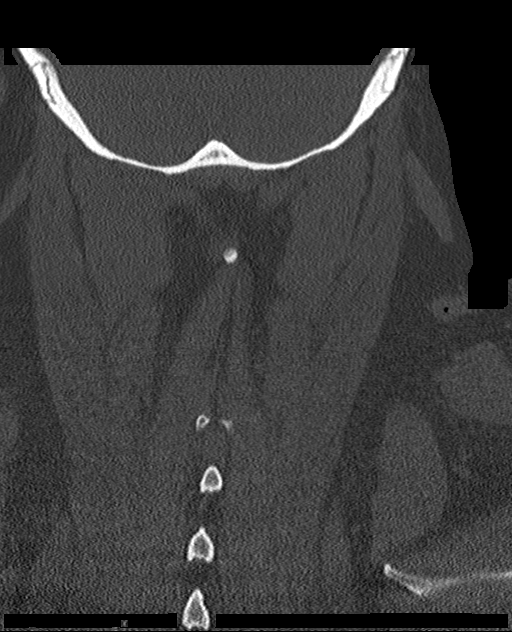

[Series 7: orthogonal axials · axial · 0.21mm/px · z∈[+877,+968]mm · 5 of 80 slices shown, 7 images]
[im 14/80  soft-tissue]
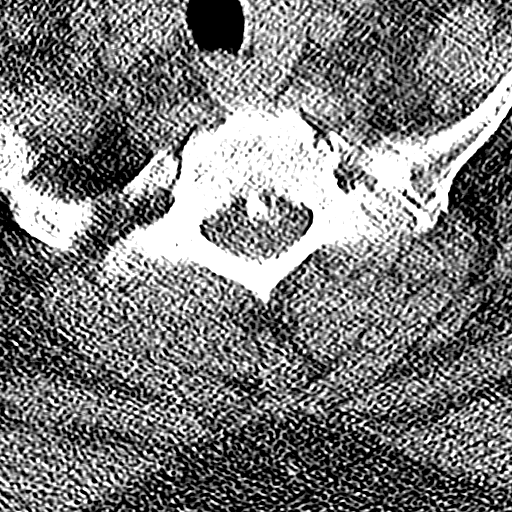
[im 14/80  bone]
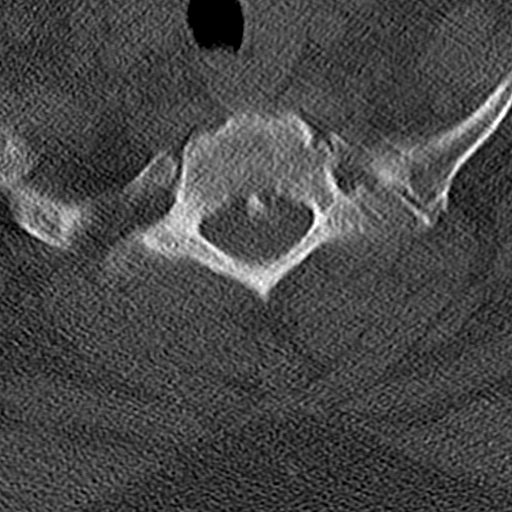
[im 27/80  bone]
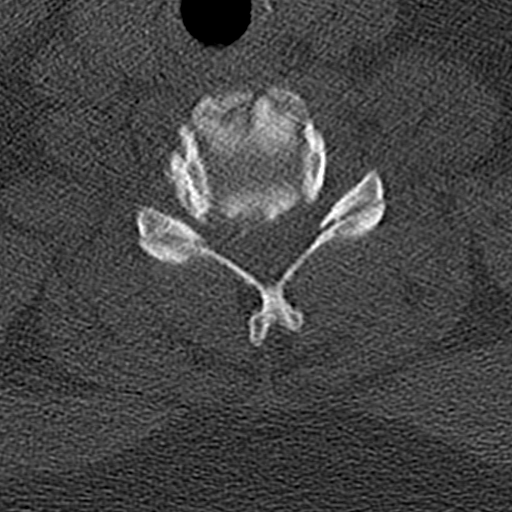
[im 40/80  bone]
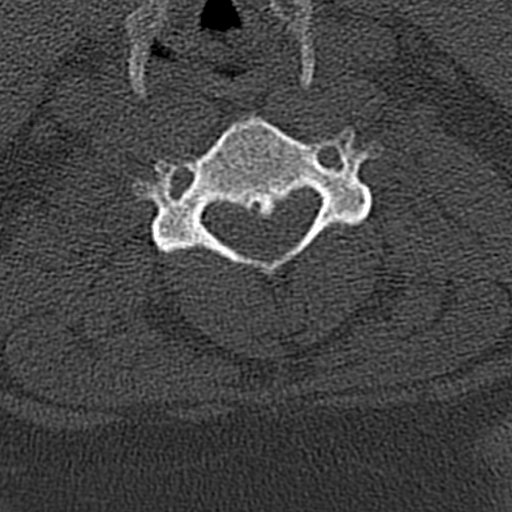
[im 53/80  bone]
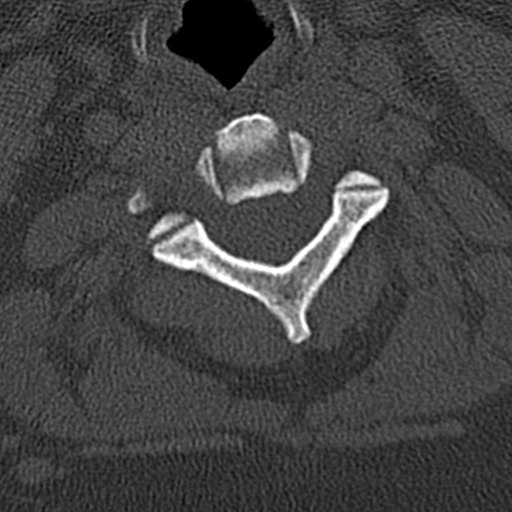
[im 66/80  soft-tissue]
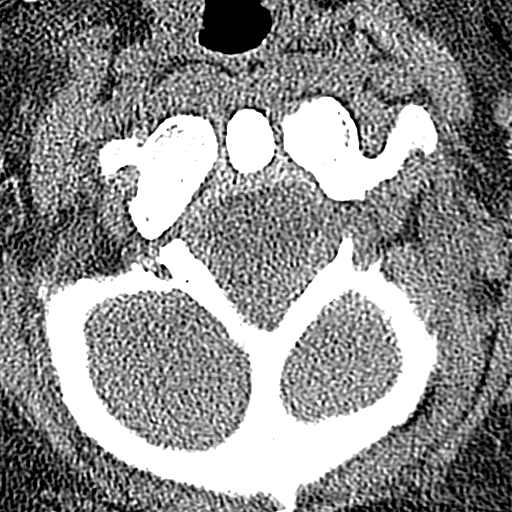
[im 66/80  bone]
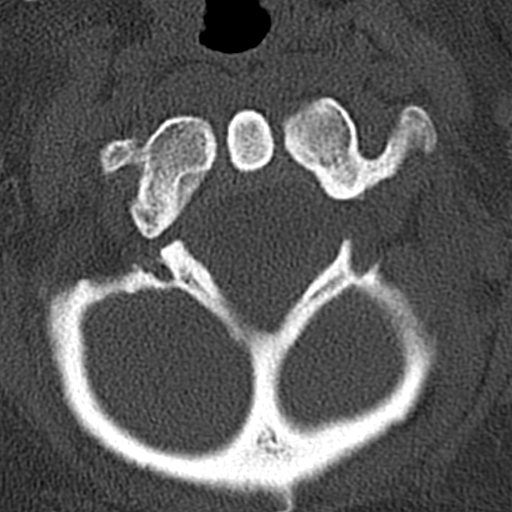

[13 of 35 positions shown; findings below may reference images not displayed]

FINDINGS: Alignment: Reversal of cervical lordosis.  No listhesis.

Skull base and vertebrae: No evidence of fracture, destruction, or
bone lesion.

Soft tissues and spinal canal: No prevertebral edema or masslike
finding in the neck.

Disc levels: Posterior longitudinal ligament ossification at C4-C7
with superimposed disc protrusions and buttressing osteophytes.
Spinal stenosis with cord compression at C4-5 to C6-7. Uncovertebral
spurring at the same levels causes right more than left foraminal
impingement at C4-5 and C5-6 and biforaminal impingement at C6-7.

Upper chest: Negative
IMPRESSION: Disc herniations and posterior longitudinal ligament ossification
causes cord compression at C4-5 to C6-7. Foraminal impingement at
the same levels, greater on the right.

## 2021-05-02 ENCOUNTER — Other Ambulatory Visit: Payer: Self-pay | Admitting: Neurosurgery

## 2021-05-02 ENCOUNTER — Other Ambulatory Visit: Payer: Self-pay | Admitting: Neurological Surgery

## 2021-05-27 ENCOUNTER — Other Ambulatory Visit: Payer: Self-pay | Admitting: Neurological Surgery

## 2021-05-29 NOTE — Pre-Procedure Instructions (Signed)
Surgical Instructions:    Your procedure is scheduled on Tuesday, July 19th.  Report to [facility] Main Entrance "A" at 05:30 A.M., then check in with the Admitting office.  Call this number if you have any questions prior to your surgery date, or have problems the morning of surgery:  [phone]    Remember:  Do not eat or drink after midnight the night before your surgery.     Take these medicines the morning of surgery with A SIP OF WATER: NONE     As of today, STOP taking any Aspirin (unless otherwise instructed by your surgeon) Aleve, Naproxen, Ibuprofen, Motrin, Advil, Goody's, BC's, all herbal medications, fish oil, and all vitamins.             Special instructions:    [facility]- Preparing For Surgery  Before surgery, you can play an important role. Because skin is not sterile, your skin needs to be as free of germs as possible. You can reduce the number of germs on your skin by washing with CHG (chlorahexidine gluconate) Soap before surgery.  CHG is an antiseptic cleaner which kills germs and bonds with the skin to continue killing germs even after washing.     Please do not use if you have an allergy to CHG or antibacterial soaps. If your skin becomes reddened/irritated stop using the CHG.  Do not shave (including legs and underarms) for at least 48 hours prior to first CHG shower. It is OK to shave your face.  Please follow these instructions carefully.     Shower the NIGHT BEFORE SURGERY and the MORNING OF SURGERY with CHG Soap.   If you chose to wash your hair, wash your hair first as usual with your normal shampoo. After you shampoo, rinse your hair and body thoroughly to remove the shampoo.  Then [employer] and genitals (private parts) with your normal soap and rinse thoroughly to remove soap.  After that Use CHG Soap as you would any other liquid soap. You can apply CHG directly to the skin and wash gently with a scrungie or a clean washcloth.   Apply the  CHG Soap to your body ONLY FROM THE NECK DOWN.  Do not use on open wounds or open sores. Avoid contact with your eyes, ears, mouth and genitals (private parts). Wash Face and genitals (private parts)  with your normal soap.   Wash thoroughly, paying special attention to the area where your surgery will be performed.  Thoroughly rinse your body with warm water from the neck down.  DO NOT shower/wash with your normal soap after using and rinsing off the CHG Soap.  Pat yourself dry with a CLEAN TOWEL.  Wear CLEAN PAJAMAS to bed the night before surgery  Place CLEAN SHEETS on your bed the night before your surgery  DO NOT SLEEP WITH PETS.   Day of Surgery:  Take a shower with CHG soap. Wear Clean/Comfortable clothing the morning of surgery Do not wear lotions, powders, perfumes, or deodorant.   Remember to brush your teeth WITH YOUR REGULAR TOOTHPASTE. Do not wear jewelry or makeup. DO Not wear nail polish, gel polish, artificial nails, or any other type of covering on natural nails including finger and toenails. If patients have artificial nails, gel coating, etc. that need to be removed by a nail salon please have this removed prior to surgery or surgery may need to be canceled/delayed if the surgeon/ anesthesia feels like the patient is unable to be adequately  monitored. Do not shave 48 hours prior to surgery.  Men may shave face and neck. Do not bring valuables to the hospital. [facility] is not responsible for any belongings or valuables.  Do NOT Smoke (Tobacco/Vaping) or drink Alcohol 24 hours prior to your procedure.  If you use a CPAP at night, you may bring all equipment for your overnight stay.   Contacts, glasses, dentures or bridgework may not be worn into surgery, please bring cases for these belongings.   For patients admitted to the hospital, discharge time will be determined by your treatment team.  Patients discharged the day of surgery will not be allowed to drive  home, and someone needs to stay with them for 24 hours.  ONLY 1 SUPPORT PERSON MAY BE PRESENT WHILE YOU ARE IN SURGERY. IF YOU ARE TO BE ADMITTED ONCE YOU ARE IN YOUR ROOM YOU WILL BE ALLOWED TWO (2) VISITORS.  Minor children may have two parents present. Special consideration for safety and communication needs will be reviewed on a case by case basis.     Please read over the following fact sheets that you were given.

## 2021-05-30 ENCOUNTER — Other Ambulatory Visit: Payer: Self-pay

## 2021-05-30 ENCOUNTER — Encounter (HOSPITAL_COMMUNITY)
Admission: RE | Admit: 2021-05-30 | Discharge: 2021-05-30 | Disposition: A | Discharge status: Home / Self Care | Payer: Medicaid Other | Source: Ambulatory Visit | Attending: Neurological Surgery | Admitting: Neurological Surgery

## 2021-05-30 ENCOUNTER — Encounter (HOSPITAL_COMMUNITY): Payer: Self-pay

## 2021-05-30 DIAGNOSIS — Z7901 Long term (current) use of anticoagulants: Secondary | ICD-10-CM | POA: Diagnosis not present

## 2021-05-30 DIAGNOSIS — Z6841 Body Mass Index (BMI) 40.0 and over, adult: Secondary | ICD-10-CM | POA: Insufficient documentation

## 2021-05-30 DIAGNOSIS — I10 Essential (primary) hypertension: Secondary | ICD-10-CM | POA: Diagnosis not present

## 2021-05-30 DIAGNOSIS — Z20822 Contact with and (suspected) exposure to covid-19: Secondary | ICD-10-CM | POA: Diagnosis not present

## 2021-05-30 DIAGNOSIS — Z01812 Encounter for preprocedural laboratory examination: Secondary | ICD-10-CM | POA: Insufficient documentation

## 2021-05-30 HISTORY — DX: Gastro-esophageal reflux disease without esophagitis: K21.9

## 2021-05-30 HISTORY — DX: Myoneural disorder, unspecified: G70.9

## 2021-05-30 HISTORY — DX: Unspecified osteoarthritis, unspecified site: M19.90

## 2021-05-30 HISTORY — DX: Other specified postprocedural states: Z98.890

## 2021-05-30 HISTORY — DX: Bipolar disorder, unspecified: F31.9

## 2021-05-30 HISTORY — DX: Unspecified asthma, uncomplicated: J45.909

## 2021-05-30 HISTORY — DX: Other specified postprocedural states: R11.2

## 2021-05-30 HISTORY — DX: Prediabetes: R73.03

## 2021-05-30 HISTORY — DX: Headache, unspecified: R51.9

## 2021-05-30 HISTORY — DX: Dyspnea, unspecified: R06.00

## 2021-05-30 LAB — COMPREHENSIVE METABOLIC PANEL
ALT: 22 U/L (ref 0–44)
AST: 15 U/L (ref 15–41)
Albumin: 3.3 g/dL — ABNORMAL LOW (ref 3.5–5.0)
Alkaline Phosphatase: 83 U/L (ref 38–126)
Anion gap: 10 (ref 5–15)
BUN: 10 mg/dL (ref 6–20)
CO2: 24 mmol/L (ref 22–32)
Calcium: 9.1 mg/dL (ref 8.9–10.3)
Chloride: 105 mmol/L (ref 98–111)
Creatinine, Ser: 0.67 mg/dL (ref 0.44–1.00)
GFR, Estimated: >60 mL/min (ref >60)
Glucose, Bld: 139 mg/dL — ABNORMAL HIGH (ref 70–99)
Potassium: 3.1 mmol/L — ABNORMAL LOW (ref 3.5–5.1)
Sodium: 139 mmol/L (ref 135–145)
Total Bilirubin: 0.7 mg/dL (ref 0.3–1.2)
Total Protein: 6.6 g/dL (ref 6.5–8.1)

## 2021-05-30 LAB — CBC
HCT: 48.2 % — ABNORMAL HIGH (ref 36.0–46.0)
Hemoglobin: 15.8 g/dL — ABNORMAL HIGH (ref 12.0–15.0)
MCH: 27.8 pg (ref 26.0–34.0)
MCHC: 32.8 g/dL (ref 30.0–36.0)
MCV: 84.9 fL (ref 80.0–100.0)
Platelets: 388 10*3/uL (ref 150–400)
RBC: 5.68 MIL/uL — ABNORMAL HIGH (ref 3.87–5.11)
RDW: 14.3 % (ref 11.5–15.5)
WBC: 16.3 10*3/uL — ABNORMAL HIGH (ref 4.0–10.5)
nRBC: 0 % (ref 0.0–0.2)

## 2021-05-30 LAB — HEMOGLOBIN A1C
Hgb A1c MFr Bld: 5.7 % — ABNORMAL HIGH (ref 4.8–5.6)
Mean Plasma Glucose: 116.89 mg/dL

## 2021-05-30 LAB — TYPE AND SCREEN
ABO/RH(D): A POS
Antibody Screen: NEGATIVE

## 2021-05-30 LAB — SARS CORONAVIRUS 2 (TAT 6-24 HRS): SARS Coronavirus 2: NEGATIVE

## 2021-05-30 LAB — SURGICAL PCR SCREEN
MRSA, PCR: NEGATIVE
Staphylococcus aureus: NEGATIVE

## 2021-05-30 NOTE — Anesthesia Preprocedure Evaluation (Addendum)
Anesthesia Evaluation  Patient identified by MRN, date of birth, ID band Patient awake    Reviewed: Allergy & Precautions, NPO status , Patient's Chart, lab work & pertinent test results, reviewed documented beta blocker date and time   History of Anesthesia Complications (+) PONV  Airway Mallampati: III  TM Distance: >3 FB Neck ROM: Full  Mouth opening: Limited Mouth Opening  Dental  (+) Missing, Dental Advisory Given, Chipped,    Pulmonary shortness of breath, asthma , Current SmokerPatient did not abstain from smoking.,    Pulmonary exam normal breath sounds clear to auscultation       Cardiovascular hypertension, Pt. on home beta blockers and Pt. on medications Normal cardiovascular exam Rhythm:Regular Rate:Normal     Neuro/Psych  Headaches, PSYCHIATRIC DISORDERS Anxiety Depression Bipolar Disorder    GI/Hepatic GERD  ,(+)     substance abuse  marijuana use,   Endo/Other  Morbid obesity (BMI 42)  Renal/GU negative Renal ROS  negative genitourinary   Musculoskeletal negative musculoskeletal ROS (+)   Abdominal   Peds  Hematology negative hematology ROS (+)   Anesthesia Other Findings   Reproductive/Obstetrics                           Anesthesia Physical Anesthesia Plan  ASA: 3  Anesthesia Plan: General   Post-op Pain Management:    Induction: Intravenous  PONV Risk Score and Plan: 3 and Midazolam, Dexamethasone and Ondansetron  Airway Management Planned: Oral ETT  Additional Equipment:   Intra-op Plan:   Post-operative Plan: Extubation in OR  Informed Consent: I have reviewed the patients History and Physical, chart, labs and discussed the procedure including the risks, benefits and alternatives for the proposed anesthesia with the patient or authorized representative who has indicated his/her understanding and acceptance.     Dental advisory given  Plan Discussed  with: CRNA  Anesthesia Plan Comments:        Anesthesia Quick Evaluation

## 2021-05-30 NOTE — Progress Notes (Signed)
Anesthesia Chart Review:  35 year old female for CT 4 through 7 ACDF with possible C5 and C6 corpectomy on 06/03/2021 with Dr. Jake Samples.  Noted at preadmission testing to have markedly uncontrolled hypertension, 188/116.  She has a history of hypertension, prediabetes, asthma, morbid obesity with BMI 42.  Antihypertensive therapy is amlodipine, lisinopril/HCTZ, metoprolol.  She reports she has taken on these medicines for the past 2 weeks as she ran out.  She has not contacted her PCP for refills.  Her PCP is Dr. Waldon Reining in [facility].  She was advised that both her systolic and diastolic pressures are prohibitive for surgery.  She was instructed to call her PCP immediately upon leaving her prevention testing appointment to get restarted on her medications.  If she is not restarted on medications prior to surgery it is very likely her case will be canceled.  I also called and spoke with Dr. Mattie Marlin surgical scheduler who is aware of patient's uncontrolled hypertension.  Dr. Jake Samples advised to proceed as planned.  Preop labs reviewed, WBC noted to be elevated at 16.3.  PAT RN notified surgeon's office.  Remainder of labs unremarkable.  Reviewed uncontrolled hypertension with Dr. Desmond Lope.  He reiterated high likelihood of cancellation if patient is unable to restart antihypertensive medications and is markedly hypertensive on day of surgery.  EKG 05/30/2021: NSR.  88.  Possible LAE.  Zannie Cove [facility] Short Stay Center/Anesthesiology Phone [phone] 05/30/2021 3:09 PM

## 2021-05-30 NOTE — Progress Notes (Addendum)
PCP - Dr. Waldon Reining in [place] Cardiologist - denies  PPM/ICD - denies   Chest x-ray - n/a EKG - 05/30/21 Stress Test - denies ECHO - denies Cardiac Cath - denies  Sleep Study - denies, multiple risk factors   Pre diabetic- does not check CBGs at home  As of today, STOP taking any Aspirin (unless otherwise instructed by your surgeon) Aleve, Naproxen, Ibuprofen, Motrin, Advil, Goody's, BC's, all herbal medications, fish oil, and all vitamins.  ERAS Protcol -no   COVID TEST- 05/30/21 in PAT   Anesthesia review: yes, elevated blood pressure at pre op appt. Antionette Poles notified. Pt instructed to notify pcp for refills of blood pressure medication and to start taking them as prescribed asap. Dr. Jake Samples also called and said surgery would be moved up to 06/02/21 at 730am.   Patient denies shortness of breath, fever, cough and chest pain at PAT appointment   All instructions explained to the patient, with a verbal understanding of the material. Patient agrees to go over the instructions while at home for a better understanding. Patient also instructed to self quarantine after being tested for COVID-19. The opportunity to ask questions was provided.

## 2021-05-30 NOTE — Progress Notes (Signed)
Notified Erie Noe of abnormal WBC on lab results. Left voicemail.

## 2021-05-30 NOTE — Pre-Procedure Instructions (Addendum)
Surgical Instructions:    Your procedure is scheduled on 06/02/21..  Report to [facility] Main Entrance "A" at 05:30 A.M., then check in with the Admitting office.  Call this number if you have any questions prior to your surgery date, or have problems the morning of surgery:  [phone]    Remember:  Do not eat or drink after midnight the night before your surgery.     Take these medicines the morning of surgery with A SIP OF WATER:  amLODipine (NORVASC)  metoprolol succinate (TOPROL-XL) acetaminophen (TYLENOL)      As of today, STOP taking any Aspirin (unless otherwise instructed by your surgeon) Aleve, Naproxen, Ibuprofen, Motrin, Advil, Goody's, BC's, all herbal medications, fish oil, and all vitamins.     HOW TO MANAGE YOUR DIABETES BEFORE AND AFTER SURGERY  Why is it important to control my blood sugar before and after surgery? Improving blood sugar levels before and after surgery helps healing and can limit problems. A way of improving blood sugar control is eating a healthy diet by:  Eating less sugar and carbohydrates  Increasing activity/exercise  Talking with your doctor about reaching your blood sugar goals High blood sugars (greater than 180 mg/dL) can raise your risk of infections and slow your recovery, so you will need to focus on controlling your diabetes during the weeks before surgery. Make sure that the doctor who takes care of your diabetes knows about your planned surgery including the date and location.  How do I manage my blood sugar before surgery? Check your blood sugar at least 4 times a day, starting 2 days before surgery, to make sure that the level is not too high or low.  Check your blood sugar the morning of your surgery when you wake up and every 2 hours until you get to the Short Stay unit.  If your blood sugar is less than 70 mg/dL, you will need to treat for low blood sugar: Do not take insulin. Treat a low blood sugar (less than 70  mg/dL) with  cup of clear juice (cranberry or apple), 4 glucose tablets, OR glucose gel. Recheck blood sugar in 15 minutes after treatment (to make sure it is greater than 70 mg/dL). If your blood sugar is not greater than 70 mg/dL on recheck, call [phone] for further instructions. Report your blood sugar to the short stay nurse when you get to Short Stay.  If you are admitted to the hospital after surgery: Your blood sugar will be checked by the staff and you will probably be given insulin after surgery (instead of oral diabetes medicines) to make sure you have good blood sugar levels. The goal for blood sugar control after surgery is 80-180 mg/dL.              Special instructions:    [place]- Preparing For Surgery  Before surgery, you can play an important role. Because skin is not sterile, your skin needs to be as free of germs as possible. You can reduce the number of germs on your skin by washing with CHG (chlorahexidine gluconate) Soap before surgery.  CHG is an antiseptic cleaner which kills germs and bonds with the skin to continue killing germs even after washing.     Please do not use if you have an allergy to CHG or antibacterial soaps. If your skin becomes reddened/irritated stop using the CHG.  Do not shave (including legs and underarms) for at least 48 hours prior to first CHG shower.  It is OK to shave your face.  Please follow these instructions carefully.     Shower the NIGHT BEFORE SURGERY and the MORNING OF SURGERY with CHG Soap.   If you chose to wash your hair, wash your hair first as usual with your normal shampoo. After you shampoo, rinse your hair and body thoroughly to remove the shampoo.  Then [employer] and genitals (private parts) with your normal soap and rinse thoroughly to remove soap.  After that Use CHG Soap as you would any other liquid soap. You can apply CHG directly to the skin and wash gently with a scrungie or a clean washcloth.   Apply  the CHG Soap to your body ONLY FROM THE NECK DOWN.  Do not use on open wounds or open sores. Avoid contact with your eyes, ears, mouth and genitals (private parts). Wash Face and genitals (private parts)  with your normal soap.   Wash thoroughly, paying special attention to the area where your surgery will be performed.  Thoroughly rinse your body with warm water from the neck down.  DO NOT shower/wash with your normal soap after using and rinsing off the CHG Soap.  Pat yourself dry with a CLEAN TOWEL.  Wear CLEAN PAJAMAS to bed the night before surgery  Place CLEAN SHEETS on your bed the night before your surgery  DO NOT SLEEP WITH PETS.   Day of Surgery:  Take a shower with CHG soap. Wear Clean/Comfortable clothing the morning of surgery Do not wear lotions, powders, perfumes, or deodorant.   Remember to brush your teeth WITH YOUR REGULAR TOOTHPASTE. Do not wear jewelry or makeup. DO Not wear nail polish, gel polish, artificial nails, or any other type of covering on natural nails including finger and toenails. If patients have artificial nails, gel coating, etc. that need to be removed by a nail salon please have this removed prior to surgery or surgery may need to be canceled/delayed if the surgeon/ anesthesia feels like the patient is unable to be adequately monitored. Do not shave 48 hours prior to surgery.  Men may shave face and neck. Do not bring valuables to the hospital. [facility] is not responsible for any belongings or valuables.  Do NOT Smoke (Tobacco/Vaping) or drink Alcohol 24 hours prior to your procedure.  If you use a CPAP at night, you may bring all equipment for your overnight stay.   Contacts, glasses, dentures or bridgework may not be worn into surgery, please bring cases for these belongings.   For patients admitted to the hospital, discharge time will be determined by your treatment team.  Patients discharged the day of surgery will not be allowed to  drive home, and someone needs to stay with them for 24 hours.  ONLY 1 SUPPORT PERSON MAY BE PRESENT WHILE YOU ARE IN SURGERY. IF YOU ARE TO BE ADMITTED ONCE YOU ARE IN YOUR ROOM YOU WILL BE ALLOWED TWO (2) VISITORS.  Minor children may have two parents present. Special consideration for safety and communication needs will be reviewed on a case by case basis.     Please read over the following fact sheets that you were given.

## 2021-06-02 ENCOUNTER — Encounter (HOSPITAL_COMMUNITY): Payer: Self-pay | Admitting: Neurological Surgery

## 2021-06-02 ENCOUNTER — Inpatient Hospital Stay (HOSPITAL_COMMUNITY): Payer: Medicaid Other | Admitting: Physician Assistant

## 2021-06-02 ENCOUNTER — Other Ambulatory Visit: Payer: Self-pay

## 2021-06-02 ENCOUNTER — Encounter (HOSPITAL_COMMUNITY)
Admission: RE | Disposition: A | Discharge status: Home / Self Care | Payer: Self-pay | Source: Home / Self Care | Attending: Neurological Surgery

## 2021-06-02 ENCOUNTER — Inpatient Hospital Stay (HOSPITAL_COMMUNITY)
Admission: RE | Admit: 2021-06-02 | Discharge: 2021-06-09 | DRG: 454 | Disposition: A | Discharge status: Home / Self Care | Payer: Medicaid Other | Attending: Neurological Surgery | Admitting: Neurological Surgery

## 2021-06-02 ENCOUNTER — Inpatient Hospital Stay (HOSPITAL_COMMUNITY): Payer: Medicaid Other | Admitting: Certified Registered"

## 2021-06-02 ENCOUNTER — Inpatient Hospital Stay (HOSPITAL_COMMUNITY): Payer: Medicaid Other

## 2021-06-02 DIAGNOSIS — I10 Essential (primary) hypertension: Secondary | ICD-10-CM | POA: Diagnosis not present

## 2021-06-02 DIAGNOSIS — M4802 Spinal stenosis, cervical region: Secondary | ICD-10-CM | POA: Diagnosis not present

## 2021-06-02 DIAGNOSIS — M4712 Other spondylosis with myelopathy, cervical region: Principal | ICD-10-CM | POA: Diagnosis present

## 2021-06-02 DIAGNOSIS — G959 Disease of spinal cord, unspecified: Secondary | ICD-10-CM | POA: Diagnosis present

## 2021-06-02 DIAGNOSIS — M50023 Cervical disc disorder at C6-C7 level with myelopathy: Secondary | ICD-10-CM | POA: Diagnosis not present

## 2021-06-02 DIAGNOSIS — F17210 Nicotine dependence, cigarettes, uncomplicated: Secondary | ICD-10-CM | POA: Diagnosis not present

## 2021-06-02 DIAGNOSIS — M160 Bilateral primary osteoarthritis of hip: Secondary | ICD-10-CM | POA: Diagnosis not present

## 2021-06-02 DIAGNOSIS — Z09 Encounter for follow-up examination after completed treatment for conditions other than malignant neoplasm: Secondary | ICD-10-CM

## 2021-06-02 DIAGNOSIS — Z6841 Body Mass Index (BMI) 40.0 and over, adult: Secondary | ICD-10-CM | POA: Diagnosis not present

## 2021-06-02 DIAGNOSIS — M170 Bilateral primary osteoarthritis of knee: Secondary | ICD-10-CM | POA: Diagnosis present

## 2021-06-02 DIAGNOSIS — Z79899 Other long term (current) drug therapy: Secondary | ICD-10-CM

## 2021-06-02 DIAGNOSIS — Z419 Encounter for procedure for purposes other than remedying health state, unspecified: Secondary | ICD-10-CM

## 2021-06-02 DIAGNOSIS — K219 Gastro-esophageal reflux disease without esophagitis: Secondary | ICD-10-CM | POA: Diagnosis not present

## 2021-06-02 HISTORY — PX: ANTERIOR CERVICAL DECOMP/DISCECTOMY FUSION: SHX1161

## 2021-06-02 LAB — POCT PREGNANCY, URINE: Preg Test, Ur: NEGATIVE

## 2021-06-02 LAB — CBC
HCT: 47.9 % — ABNORMAL HIGH (ref 36.0–46.0)
Hemoglobin: 15.4 g/dL — ABNORMAL HIGH (ref 12.0–15.0)
MCH: 27.5 pg (ref 26.0–34.0)
MCHC: 32.2 g/dL (ref 30.0–36.0)
MCV: 85.7 fL (ref 80.0–100.0)
Platelets: 408 10*3/uL — ABNORMAL HIGH (ref 150–400)
RBC: 5.59 MIL/uL — ABNORMAL HIGH (ref 3.87–5.11)
RDW: 14.4 % (ref 11.5–15.5)
WBC: 20.3 10*3/uL — ABNORMAL HIGH (ref 4.0–10.5)
nRBC: 0 % (ref 0.0–0.2)

## 2021-06-02 LAB — CREATININE, SERUM
Creatinine, Ser: 0.74 mg/dL (ref 0.44–1.00)
GFR, Estimated: >60 mL/min (ref >60)

## 2021-06-02 LAB — ABO/RH: ABO/RH(D): A POS

## 2021-06-02 IMAGING — RF DG CERVICAL SPINE 2 OR 3 VIEWS
1 series · 4 of 4 positions shown · non-contrast
Comparison: Cervical spine CT [DATE].

CLINICAL DATA: Elective surgery. Additional history provided:
Posterior cervical instrumentation and fusion C4-T2, possible
laminectomy/decompression C4-C7. Provided fluoroscopy time 38
seconds (26.36 mGy).

EXAM:
CERVICAL SPINE - 2-3 VIEW; DG C-ARM 1-60 MIN

[Series 1: run · 4 of 4 slices shown]
[im 1/4]
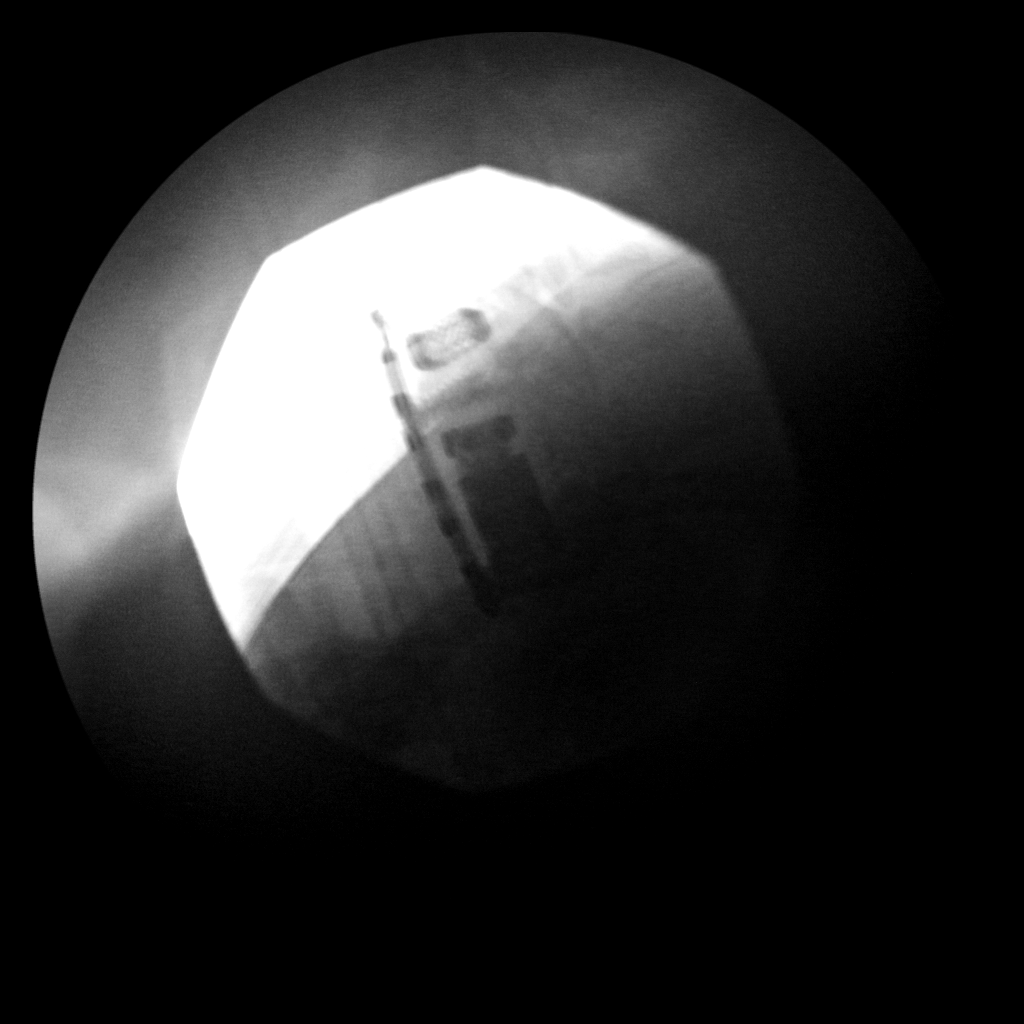
[im 2/4]
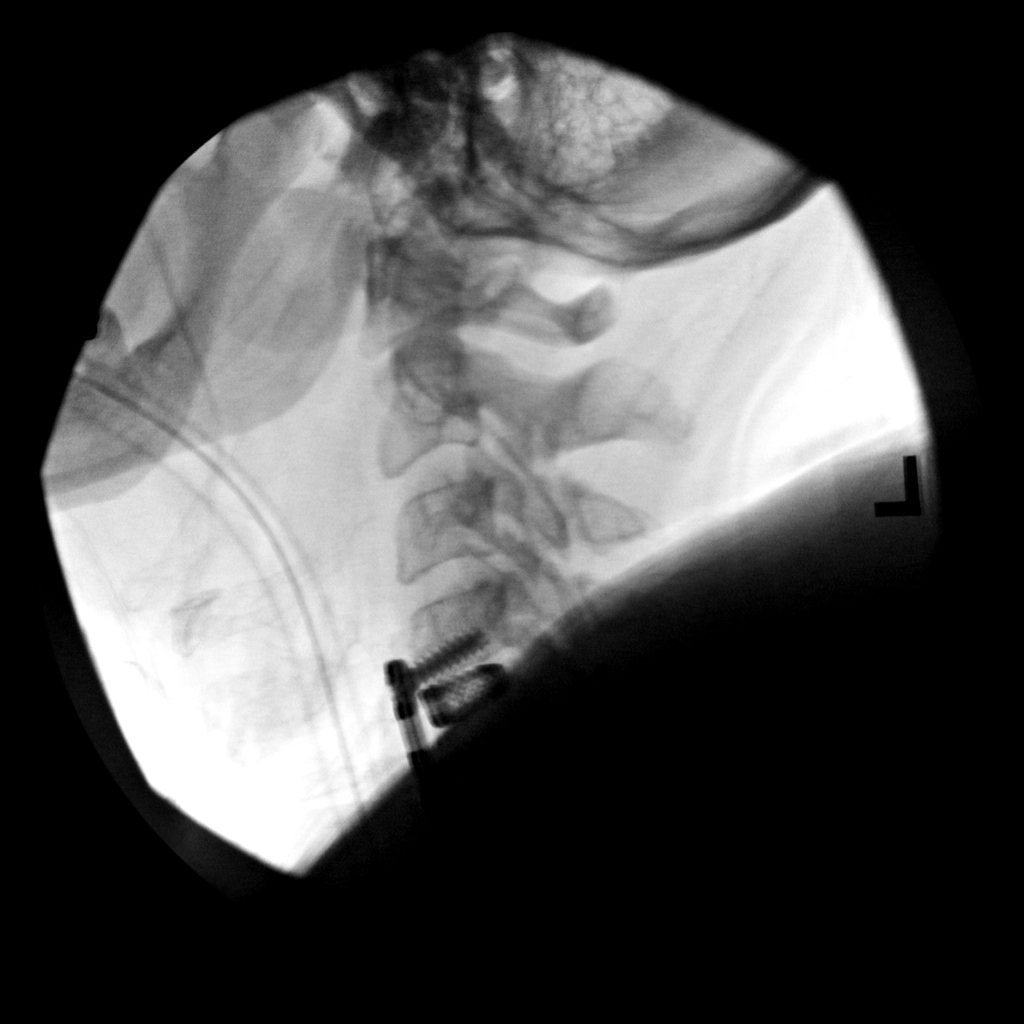
[im 3/4]
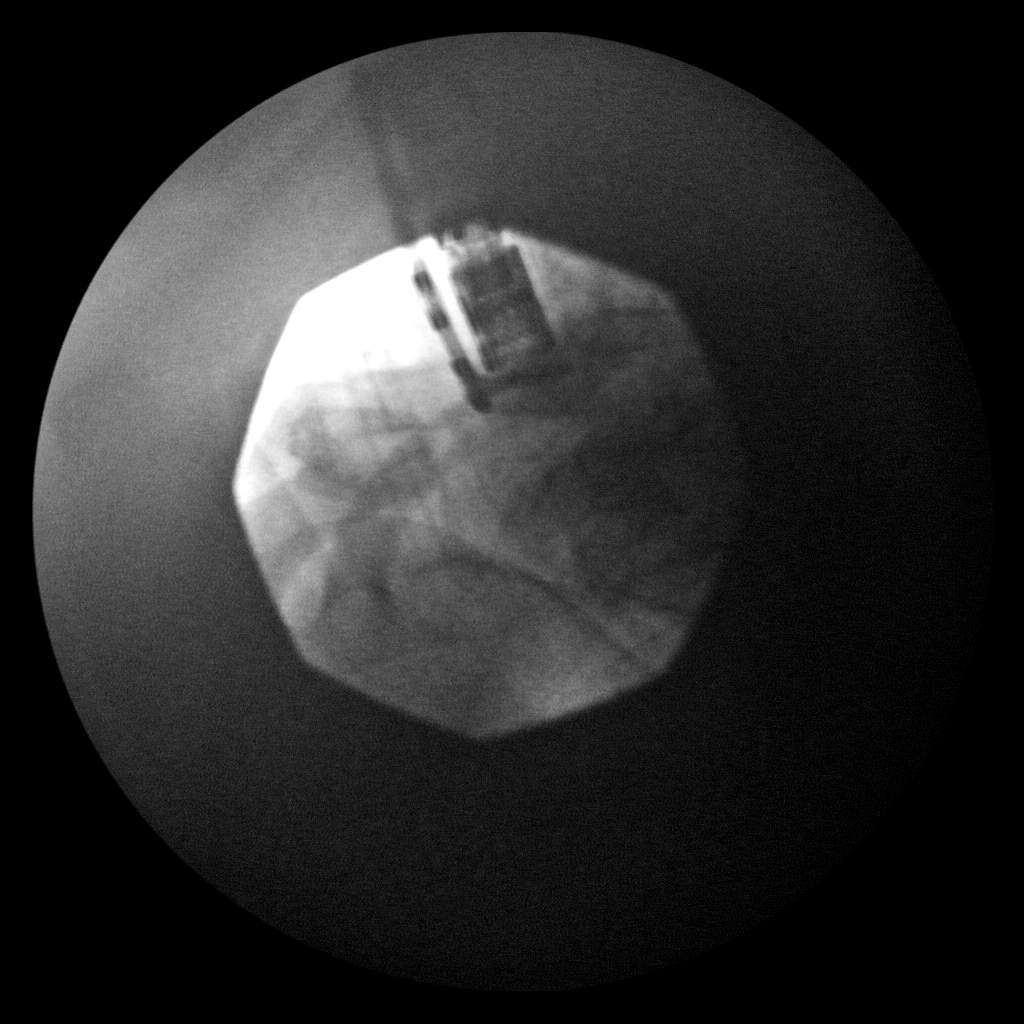
[im 4/4]
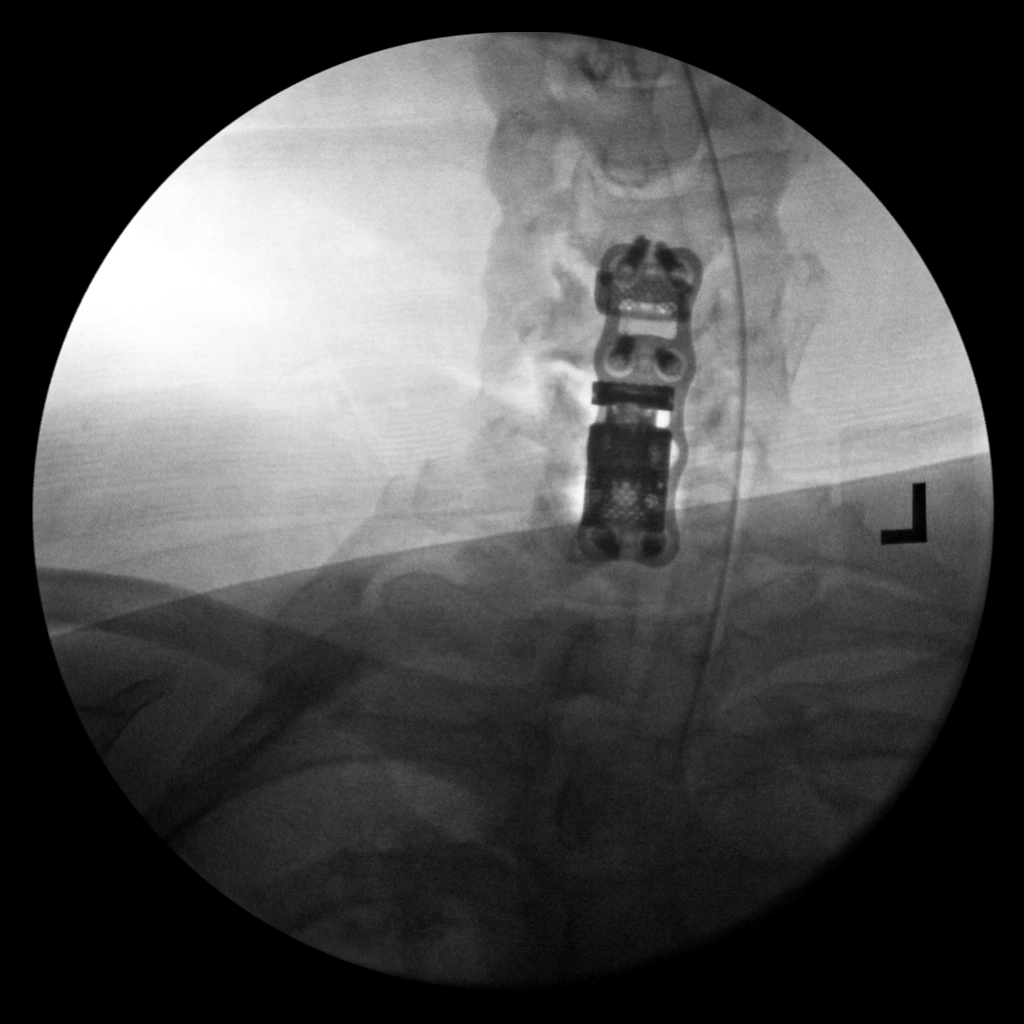

[4 of 4 positions shown; findings below may reference images not displayed]

FINDINGS: PA and lateral view intraoperative fluoroscopic images of the
cervical spine are submitted, 3 images total. On the provided
images, ACDF hardware now spans the C4-C7 levels. An interbody
device is now present at C4-C5. There has also been interval C6
corpectomy. Partially visualized ET tube.
IMPRESSION: Three intraoperative fluoroscopic images of the cervical spine, as
described.

## 2021-06-02 IMAGING — RF DG C-ARM 1-60 MIN
1 series · 4 of 4 positions shown · non-contrast
Comparison: Cervical spine CT [DATE].

CLINICAL DATA: Elective surgery. Additional history provided:
Posterior cervical instrumentation and fusion C4-T2, possible
laminectomy/decompression C4-C7. Provided fluoroscopy time 38
seconds (26.36 mGy).

EXAM:
CERVICAL SPINE - 2-3 VIEW; DG C-ARM 1-60 MIN

[Series 1: run · 4 of 4 slices shown]
[im 1/4]
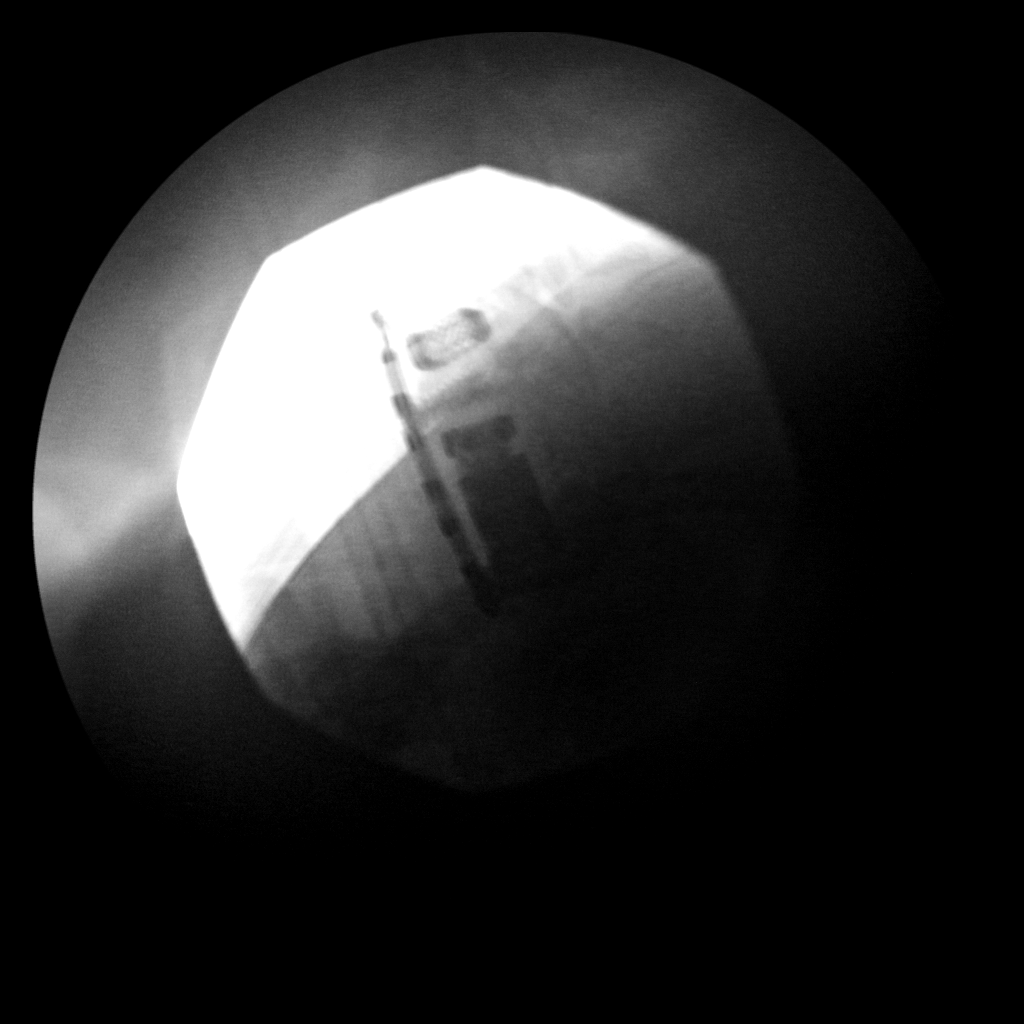
[im 2/4]
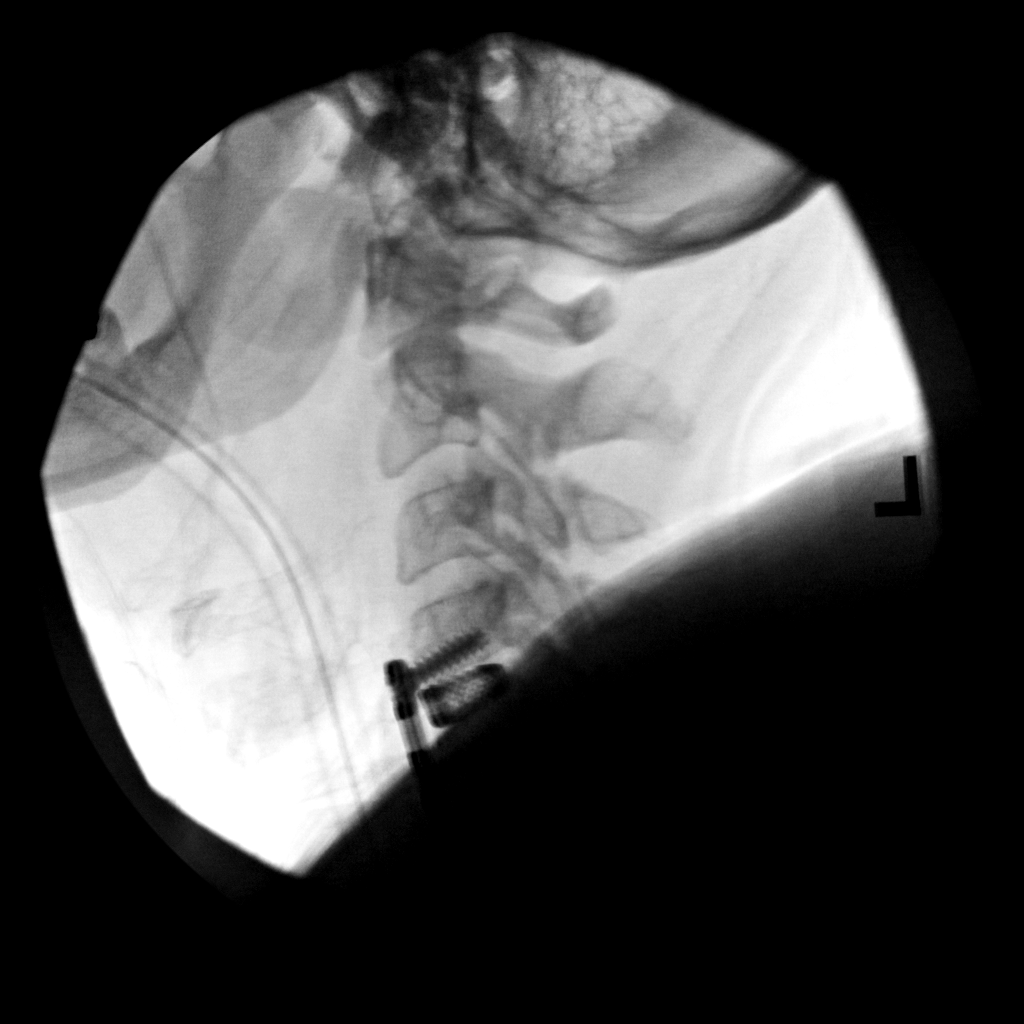
[im 3/4]
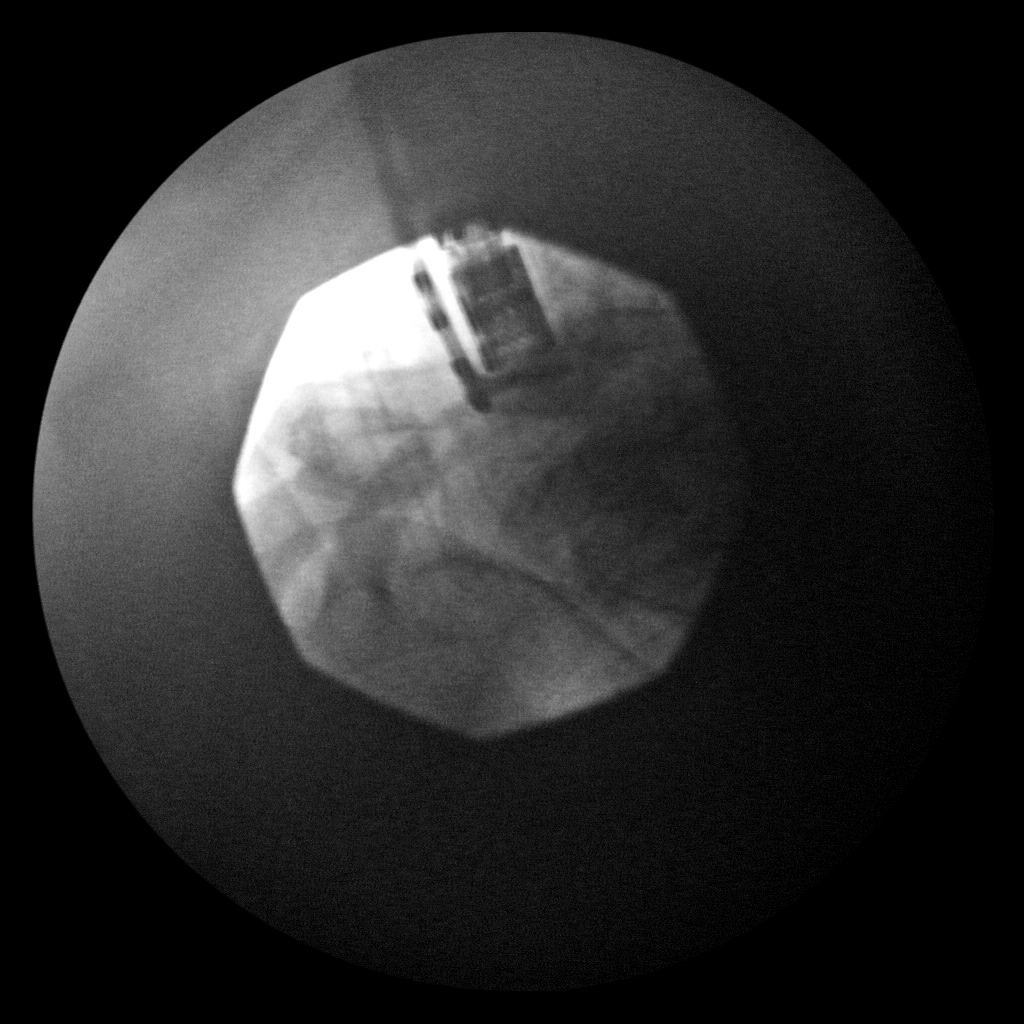
[im 4/4]
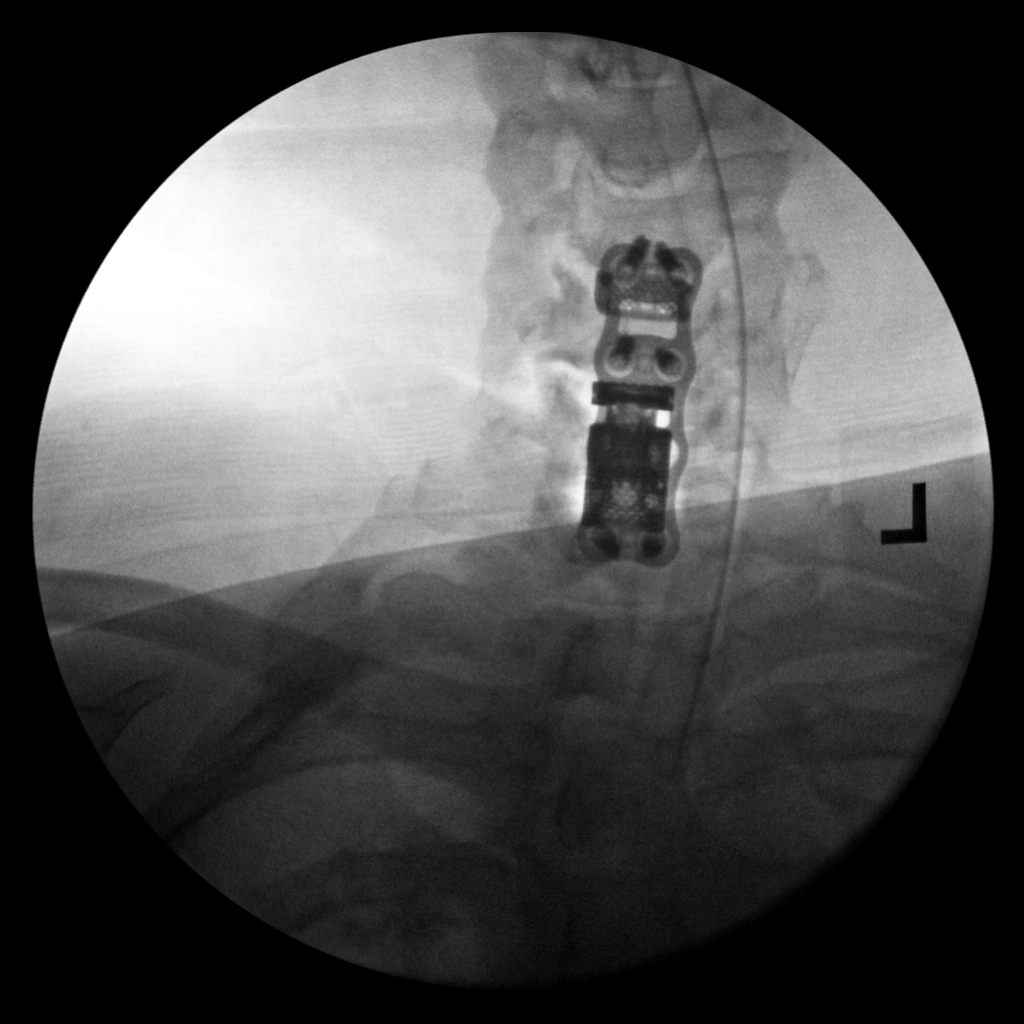

[4 of 4 positions shown; findings below may reference images not displayed]

FINDINGS: PA and lateral view intraoperative fluoroscopic images of the
cervical spine are submitted, 3 images total. On the provided
images, ACDF hardware now spans the C4-C7 levels. An interbody
device is now present at C4-C5. There has also been interval C6
corpectomy. Partially visualized ET tube.
IMPRESSION: Three intraoperative fluoroscopic images of the cervical spine, as
described.

## 2021-06-02 SURGERY — ANTERIOR CERVICAL DECOMPRESSION/DISCECTOMY FUSION 3 LEVELS
Anesthesia: General

## 2021-06-02 MED ORDER — CEFAZOLIN SODIUM-DEXTROSE 2-4 GM/100ML-% IV SOLN
2.0000 g | INTRAVENOUS | Status: AC
  Administered 2021-06-02 (×2): 2 g via INTRAVENOUS
  Filled 2021-06-02: qty 100

## 2021-06-02 MED ORDER — LISINOPRIL 10 MG PO TABS
10.0000 mg | ORAL_TABLET | Freq: Every day | ORAL | Status: DC
  Administered 2021-06-03 – 2021-06-09 (×6): 10 mg via ORAL
  Filled 2021-06-02 (×6): qty 1

## 2021-06-02 MED ORDER — CHLORHEXIDINE GLUCONATE CLOTH 2 % EX PADS: 6.0000 | MEDICATED_PAD | Freq: Once | CUTANEOUS | Status: DC

## 2021-06-02 MED ORDER — PROPOFOL 1000 MG/100ML IV EMUL
INTRAVENOUS | Status: AC
  Filled 2021-06-02: qty 100

## 2021-06-02 MED ORDER — MIDAZOLAM HCL 2 MG/2ML IJ SOLN
INTRAMUSCULAR | Status: DC | PRN
  Administered 2021-06-02: 2 mg via INTRAVENOUS

## 2021-06-02 MED ORDER — ONDANSETRON HCL 4 MG/2ML IJ SOLN
INTRAMUSCULAR | Status: DC | PRN
Start: 2021-06-02 — End: 2021-06-02
  Administered 2021-06-02 (×2): 4 mg via INTRAVENOUS

## 2021-06-02 MED ORDER — THROMBIN 5000 UNITS EX SOLR
OROMUCOSAL | Status: DC | PRN
  Administered 2021-06-02: 5 mL via TOPICAL

## 2021-06-02 MED ORDER — HYDROMORPHONE HCL 1 MG/ML IJ SOLN
0.5000 mg | INTRAMUSCULAR | Status: DC | PRN
Start: 2021-06-02 — End: 2021-06-09
  Administered 2021-06-07 – 2021-06-09 (×5): 0.5 mg via INTRAVENOUS
  Filled 2021-06-02 (×5): qty 1

## 2021-06-02 MED ORDER — HYDROCODONE-ACETAMINOPHEN 10-325 MG PO TABS
1.0000 | ORAL_TABLET | ORAL | Status: DC | PRN
  Administered 2021-06-03 – 2021-06-08 (×14): 1 via ORAL
  Filled 2021-06-02 (×14): qty 1

## 2021-06-02 MED ORDER — SODIUM CHLORIDE 0.9 % IV SOLN: 250.0000 mL | INTRAVENOUS | Status: DC

## 2021-06-02 MED ORDER — ACETAMINOPHEN 650 MG RE SUPP: 650.0000 mg | RECTAL | Status: DC | PRN

## 2021-06-02 MED ORDER — THROMBIN 20000 UNITS EX SOLR
CUTANEOUS | Status: AC
  Filled 2021-06-02: qty 20000

## 2021-06-02 MED ORDER — PHENYLEPHRINE 40 MCG/ML (10ML) SYRINGE FOR IV PUSH (FOR BLOOD PRESSURE SUPPORT)
PREFILLED_SYRINGE | INTRAVENOUS | Status: DC | PRN
  Administered 2021-06-02 (×2): 40 ug via INTRAVENOUS

## 2021-06-02 MED ORDER — 0.9 % SODIUM CHLORIDE (POUR BTL) OPTIME
TOPICAL | Status: DC | PRN
  Administered 2021-06-02: 1000 mL

## 2021-06-02 MED ORDER — SUFENTANIL CITRATE 50 MCG/ML IV SOLN
INTRAVENOUS | Status: AC
  Filled 2021-06-02: qty 1

## 2021-06-02 MED ORDER — ACETAMINOPHEN 10 MG/ML IV SOLN
INTRAVENOUS | Status: AC
  Filled 2021-06-02: qty 100

## 2021-06-02 MED ORDER — LACTATED RINGERS IV SOLN: INTRAVENOUS | Status: DC | PRN

## 2021-06-02 MED ORDER — LISINOPRIL-HYDROCHLOROTHIAZIDE 10-12.5 MG PO TABS: 1.0000 | ORAL_TABLET | Freq: Every day | ORAL | Status: DC

## 2021-06-02 MED ORDER — PROPOFOL 10 MG/ML IV BOLUS
INTRAVENOUS | Status: DC | PRN
  Administered 2021-06-02: 200 mg via INTRAVENOUS

## 2021-06-02 MED ORDER — SUCCINYLCHOLINE CHLORIDE 200 MG/10ML IV SOSY
PREFILLED_SYRINGE | INTRAVENOUS | Status: AC
  Filled 2021-06-02: qty 10

## 2021-06-02 MED ORDER — CHLORHEXIDINE GLUCONATE 0.12 % MT SOLN
15.0000 mL | Freq: Once | OROMUCOSAL | Status: AC
  Administered 2021-06-02: 15 mL via OROMUCOSAL
  Filled 2021-06-02: qty 15

## 2021-06-02 MED ORDER — ROCURONIUM BROMIDE 10 MG/ML (PF) SYRINGE
PREFILLED_SYRINGE | INTRAVENOUS | Status: AC
  Filled 2021-06-02: qty 20

## 2021-06-02 MED ORDER — KETAMINE HCL 10 MG/ML IJ SOLN
INTRAMUSCULAR | Status: DC | PRN
  Administered 2021-06-02 (×2): 25 mg via INTRAVENOUS

## 2021-06-02 MED ORDER — OXYCODONE HCL 5 MG PO TABS
10.0000 mg | ORAL_TABLET | ORAL | Status: DC | PRN
  Administered 2021-06-02 – 2021-06-09 (×20): 10 mg via ORAL
  Filled 2021-06-02 (×19): qty 2

## 2021-06-02 MED ORDER — ACETAMINOPHEN 325 MG PO TABS
650.0000 mg | ORAL_TABLET | ORAL | Status: DC | PRN
  Administered 2021-06-07: 650 mg via ORAL
  Filled 2021-06-02: qty 2

## 2021-06-02 MED ORDER — FENTANYL CITRATE (PF) 250 MCG/5ML IJ SOLN
INTRAMUSCULAR | Status: AC
  Filled 2021-06-02: qty 5

## 2021-06-02 MED ORDER — PHENYLEPHRINE 40 MCG/ML (10ML) SYRINGE FOR IV PUSH (FOR BLOOD PRESSURE SUPPORT)
PREFILLED_SYRINGE | INTRAVENOUS | Status: AC
  Filled 2021-06-02: qty 10

## 2021-06-02 MED ORDER — MENTHOL 3 MG MT LOZG
1.0000 | LOZENGE | OROMUCOSAL | Status: DC | PRN
  Filled 2021-06-02: qty 9

## 2021-06-02 MED ORDER — PROPOFOL 500 MG/50ML IV EMUL
INTRAVENOUS | Status: DC | PRN
Start: 2021-06-02 — End: 2021-06-02
  Administered 2021-06-02: 50 ug/kg/min via INTRAVENOUS

## 2021-06-02 MED ORDER — FENTANYL CITRATE (PF) 100 MCG/2ML IJ SOLN
25.0000 ug | INTRAMUSCULAR | Status: DC | PRN
  Administered 2021-06-02 (×2): 25 ug via INTRAVENOUS

## 2021-06-02 MED ORDER — LIDOCAINE 2% (20 MG/ML) 5 ML SYRINGE
INTRAMUSCULAR | Status: DC | PRN
  Administered 2021-06-02: 40 mg via INTRAVENOUS

## 2021-06-02 MED ORDER — PROPOFOL 10 MG/ML IV BOLUS
INTRAVENOUS | Status: AC
  Filled 2021-06-02: qty 20

## 2021-06-02 MED ORDER — SUCCINYLCHOLINE CHLORIDE 20 MG/ML IJ SOLN
INTRAMUSCULAR | Status: DC | PRN
  Administered 2021-06-02: 200 mg via INTRAVENOUS

## 2021-06-02 MED ORDER — THROMBIN 20000 UNITS EX SOLR: CUTANEOUS | Status: DC | PRN

## 2021-06-02 MED ORDER — METHOCARBAMOL 1000 MG/10ML IJ SOLN
500.0000 mg | Freq: Four times a day (QID) | INTRAVENOUS | Status: DC | PRN
  Filled 2021-06-02: qty 5

## 2021-06-02 MED ORDER — NICOTINE 21 MG/24HR TD PT24
21.0000 mg | MEDICATED_PATCH | Freq: Every day | TRANSDERMAL | Status: DC
  Administered 2021-06-02 – 2021-06-09 (×8): 21 mg via TRANSDERMAL
  Filled 2021-06-02 (×8): qty 1

## 2021-06-02 MED ORDER — SODIUM CHLORIDE 0.9 % IV SOLN: INTRAVENOUS | Status: DC

## 2021-06-02 MED ORDER — METHOCARBAMOL 500 MG PO TABS
ORAL_TABLET | ORAL | Status: AC
  Filled 2021-06-02: qty 1

## 2021-06-02 MED ORDER — ACETAMINOPHEN 500 MG PO TABS
1000.0000 mg | ORAL_TABLET | Freq: Once | ORAL | Status: AC
  Administered 2021-06-03: 1000 mg via ORAL
  Filled 2021-06-02: qty 2

## 2021-06-02 MED ORDER — FENTANYL CITRATE (PF) 250 MCG/5ML IJ SOLN
INTRAMUSCULAR | Status: DC | PRN
  Administered 2021-06-02 (×2): 50 ug via INTRAVENOUS

## 2021-06-02 MED ORDER — LACTATED RINGERS IV SOLN: INTRAVENOUS | Status: DC

## 2021-06-02 MED ORDER — SODIUM CHLORIDE 0.9% FLUSH: 3.0000 mL | Freq: Two times a day (BID) | INTRAVENOUS | Status: DC

## 2021-06-02 MED ORDER — TRIAMCINOLONE ACETONIDE 40 MG/ML IJ SUSP
INTRAMUSCULAR | Status: DC | PRN
  Administered 2021-06-02: 40 mg via INTRAMUSCULAR

## 2021-06-02 MED ORDER — AMLODIPINE BESYLATE 10 MG PO TABS
10.0000 mg | ORAL_TABLET | Freq: Every morning | ORAL | Status: DC
  Administered 2021-06-03 – 2021-06-09 (×6): 10 mg via ORAL
  Filled 2021-06-02: qty 1
  Filled 2021-06-02 (×2): qty 2
  Filled 2021-06-02: qty 1
  Filled 2021-06-02: qty 2
  Filled 2021-06-02: qty 1

## 2021-06-02 MED ORDER — ROCURONIUM BROMIDE 10 MG/ML (PF) SYRINGE
PREFILLED_SYRINGE | INTRAVENOUS | Status: DC | PRN
  Administered 2021-06-02: 10 mg via INTRAVENOUS
  Administered 2021-06-02: 70 mg via INTRAVENOUS
  Administered 2021-06-02 (×2): 20 mg via INTRAVENOUS
  Administered 2021-06-02: 10 mg via INTRAVENOUS
  Administered 2021-06-02 (×2): 20 mg via INTRAVENOUS

## 2021-06-02 MED ORDER — FENTANYL CITRATE (PF) 100 MCG/2ML IJ SOLN
INTRAMUSCULAR | Status: AC
  Administered 2021-06-02: 50 ug via INTRAVENOUS
  Filled 2021-06-02: qty 2

## 2021-06-02 MED ORDER — HEPARIN SODIUM (PORCINE) 5000 UNIT/ML IJ SOLN
5000.0000 [IU] | Freq: Two times a day (BID) | INTRAMUSCULAR | Status: DC
  Administered 2021-06-04 – 2021-06-05 (×3): 5000 [IU] via SUBCUTANEOUS
  Filled 2021-06-02 (×3): qty 1

## 2021-06-02 MED ORDER — KETOROLAC TROMETHAMINE 15 MG/ML IJ SOLN
15.0000 mg | Freq: Four times a day (QID) | INTRAMUSCULAR | Status: AC
  Administered 2021-06-02 – 2021-06-03 (×4): 15 mg via INTRAVENOUS
  Filled 2021-06-02 (×4): qty 1

## 2021-06-02 MED ORDER — ONDANSETRON HCL 4 MG/2ML IJ SOLN
INTRAMUSCULAR | Status: AC
  Filled 2021-06-02: qty 2

## 2021-06-02 MED ORDER — MIDAZOLAM HCL 2 MG/2ML IJ SOLN
INTRAMUSCULAR | Status: AC
  Filled 2021-06-02: qty 2

## 2021-06-02 MED ORDER — ONDANSETRON HCL 4 MG PO TABS
4.0000 mg | ORAL_TABLET | Freq: Four times a day (QID) | ORAL | Status: DC | PRN
  Administered 2021-06-08: 4 mg via ORAL
  Filled 2021-06-02: qty 1

## 2021-06-02 MED ORDER — THROMBIN 5000 UNITS EX SOLR
CUTANEOUS | Status: AC
  Filled 2021-06-02: qty 5000

## 2021-06-02 MED ORDER — METOPROLOL SUCCINATE ER 50 MG PO TB24
50.0000 mg | ORAL_TABLET | Freq: Every morning | ORAL | Status: DC
  Administered 2021-06-03 – 2021-06-09 (×6): 50 mg via ORAL
  Filled 2021-06-02 (×6): qty 1

## 2021-06-02 MED ORDER — ONDANSETRON HCL 4 MG/2ML IJ SOLN: 4.0000 mg | Freq: Four times a day (QID) | INTRAMUSCULAR | Status: DC | PRN

## 2021-06-02 MED ORDER — CEFAZOLIN SODIUM 1 G IJ SOLR
INTRAMUSCULAR | Status: AC
  Filled 2021-06-02: qty 20

## 2021-06-02 MED ORDER — OXYCODONE HCL 5 MG PO TABS
ORAL_TABLET | ORAL | Status: AC
  Filled 2021-06-02: qty 2

## 2021-06-02 MED ORDER — SUGAMMADEX SODIUM 200 MG/2ML IV SOLN
INTRAVENOUS | Status: DC | PRN
  Administered 2021-06-02: 400 mg via INTRAVENOUS

## 2021-06-02 MED ORDER — SODIUM CHLORIDE (PF) 0.9 % IJ SOLN
INTRAMUSCULAR | Status: AC
  Filled 2021-06-02: qty 10

## 2021-06-02 MED ORDER — HYDROCHLOROTHIAZIDE 12.5 MG PO CAPS
12.5000 mg | ORAL_CAPSULE | Freq: Every day | ORAL | Status: DC
  Administered 2021-06-03 – 2021-06-09 (×6): 12.5 mg via ORAL
  Filled 2021-06-02 (×6): qty 1

## 2021-06-02 MED ORDER — SUFENTANIL CITRATE 50 MCG/ML IV SOLN
INTRAVENOUS | Status: DC | PRN
  Administered 2021-06-02: 10 ug via INTRAVENOUS
  Administered 2021-06-02: 20 ug via INTRAVENOUS
  Administered 2021-06-02 (×2): 10 ug via INTRAVENOUS

## 2021-06-02 MED ORDER — SENNOSIDES-DOCUSATE SODIUM 8.6-50 MG PO TABS
1.0000 | ORAL_TABLET | Freq: Every evening | ORAL | Status: DC | PRN
  Administered 2021-06-05 – 2021-06-07 (×2): 1 via ORAL
  Filled 2021-06-02 (×2): qty 1

## 2021-06-02 MED ORDER — METHOCARBAMOL 500 MG PO TABS
500.0000 mg | ORAL_TABLET | Freq: Four times a day (QID) | ORAL | Status: DC | PRN
  Administered 2021-06-02 – 2021-06-09 (×20): 500 mg via ORAL
  Filled 2021-06-02 (×20): qty 1

## 2021-06-02 MED ORDER — PHENOL 1.4 % MT LIQD
1.0000 | OROMUCOSAL | Status: DC | PRN
  Administered 2021-06-02: 1 via OROMUCOSAL
  Filled 2021-06-02: qty 177

## 2021-06-02 MED ORDER — KETAMINE HCL 50 MG/5ML IJ SOSY
PREFILLED_SYRINGE | INTRAMUSCULAR | Status: AC
  Filled 2021-06-02: qty 5

## 2021-06-02 MED ORDER — ACETAMINOPHEN 10 MG/ML IV SOLN
INTRAVENOUS | Status: DC | PRN
  Administered 2021-06-02: 1000 mg via INTRAVENOUS

## 2021-06-02 MED ORDER — DEXAMETHASONE SODIUM PHOSPHATE 10 MG/ML IJ SOLN
INTRAMUSCULAR | Status: AC
  Filled 2021-06-02: qty 1

## 2021-06-02 MED ORDER — CEFAZOLIN SODIUM-DEXTROSE 2-4 GM/100ML-% IV SOLN
2.0000 g | Freq: Three times a day (TID) | INTRAVENOUS | Status: AC
Start: 2021-06-02 — End: 2021-06-03
  Administered 2021-06-02 – 2021-06-03 (×2): 2 g via INTRAVENOUS
  Filled 2021-06-02 (×2): qty 100

## 2021-06-02 MED ORDER — SODIUM CHLORIDE 0.9% FLUSH: 3.0000 mL | INTRAVENOUS | Status: DC | PRN

## 2021-06-02 MED ORDER — DEXAMETHASONE SODIUM PHOSPHATE 10 MG/ML IJ SOLN
INTRAMUSCULAR | Status: DC | PRN
  Administered 2021-06-02: 10 mg via INTRAVENOUS

## 2021-06-02 MED ORDER — ORAL CARE MOUTH RINSE: 15.0000 mL | Freq: Once | OROMUCOSAL | Status: AC

## 2021-06-02 MED ORDER — LIDOCAINE 2% (20 MG/ML) 5 ML SYRINGE
INTRAMUSCULAR | Status: AC
  Filled 2021-06-02: qty 5

## 2021-06-02 SURGICAL SUPPLY — 69 items
APL SKNCLS STERI-STRIP NONHPOA (GAUZE/BANDAGES/DRESSINGS) ×1
BAG COUNTER SPONGE SURGICOUNT (BAG) ×3 IMPLANT
BAG SPNG CNTER NS LX DISP (BAG) ×2
BAND INSRT 18 STRL LF DISP RB (MISCELLANEOUS) ×2
BAND RUBBER #18 3X1/16 STRL (MISCELLANEOUS) ×4 IMPLANT
BASKET BONE COLLECTION (BASKET) ×1 IMPLANT
BENZOIN TINCTURE PRP APPL 2/3 (GAUZE/BANDAGES/DRESSINGS) ×1 IMPLANT
BIT DRILL NEURO 2X3.1 SFT TUCH (MISCELLANEOUS) ×1 IMPLANT
BLADE CLIPPER SURG (BLADE) IMPLANT
BUR CARBIDE MATCH 3.0 (BURR) ×3 IMPLANT
CAGE CERV CAPRI EXP 12X14 0D (Cage) ×1 IMPLANT
CANISTER SUCT 3000ML PPV (MISCELLANEOUS) ×2 IMPLANT
CARTRIDGE OIL MAESTRO DRILL (MISCELLANEOUS) ×1 IMPLANT
COVER MAYO STAND STRL (DRAPES) ×4 IMPLANT
DIFFUSER DRILL AIR PNEUMATIC (MISCELLANEOUS) ×1 IMPLANT
DRAPE C-ARM 42X72 X-RAY (DRAPES) ×2 IMPLANT
DRAPE HALF SHEET 40X57 (DRAPES) IMPLANT
DRAPE LAPAROTOMY 100X72X124 (DRAPES) ×2 IMPLANT
DRAPE MICROSCOPE LEICA (MISCELLANEOUS) ×2 IMPLANT
DRILL NEURO 2X3.1 SOFT TOUCH (MISCELLANEOUS) ×2
DURAPREP 6ML APPLICATOR 50/CS (WOUND CARE) ×2 IMPLANT
ELECT BLADE INSULATED 4IN (ELECTROSURGICAL) ×2
ELECT COATED BLADE 2.86 ST (ELECTRODE) ×2 IMPLANT
ELECT REM PT RETURN 9FT ADLT (ELECTROSURGICAL) ×2
ELECTRODE BLADE INSULATED 4IN (ELECTROSURGICAL) IMPLANT
ELECTRODE REM PT RTRN 9FT ADLT (ELECTROSURGICAL) ×1 IMPLANT
EVACUATOR 1/8 PVC DRAIN (DRAIN) ×1 IMPLANT
GAUZE 4X4 16PLY ~~LOC~~+RFID DBL (SPONGE) ×1 IMPLANT
GLOVE EXAM NITRILE LRG STRL (GLOVE) IMPLANT
GLOVE EXAM NITRILE XL STR (GLOVE) IMPLANT
GLOVE EXAM NITRILE XS STR PU (GLOVE) IMPLANT
GLOVE SRG 8 PF TXTR STRL LF DI (GLOVE) ×1 IMPLANT
GLOVE SURG LTX SZ8 (GLOVE) ×4 IMPLANT
GLOVE SURG UNDER POLY LF SZ8 (GLOVE) ×2
GOWN STRL REUS W/ TWL LRG LVL3 (GOWN DISPOSABLE) IMPLANT
GOWN STRL REUS W/ TWL XL LVL3 (GOWN DISPOSABLE) ×1 IMPLANT
GOWN STRL REUS W/TWL 2XL LVL3 (GOWN DISPOSABLE) IMPLANT
GOWN STRL REUS W/TWL LRG LVL3 (GOWN DISPOSABLE)
GOWN STRL REUS W/TWL XL LVL3 (GOWN DISPOSABLE) ×2
HEMOSTAT POWDER KIT SURGIFOAM (HEMOSTASIS) ×2 IMPLANT
KIT BASIN OR (CUSTOM PROCEDURE TRAY) ×2 IMPLANT
KIT TURNOVER KIT B (KITS) ×2 IMPLANT
MODULE EMG NDL SSEP NVM5 (NEEDLE) IMPLANT
MODULE EMG NEEDLE SSEP NVM5 (NEEDLE) ×2 IMPLANT
NDL SPNL 18GX3.5 QUINCKE PK (NEEDLE) ×1 IMPLANT
NEEDLE HYPO 22GX1.5 SAFETY (NEEDLE) ×2 IMPLANT
NEEDLE SPNL 18GX3.5 QUINCKE PK (NEEDLE) ×2 IMPLANT
NS IRRIG 1000ML POUR BTL (IV SOLUTION) ×2 IMPLANT
OIL CARTRIDGE MAESTRO DRILL (MISCELLANEOUS)
PACK LAMINECTOMY NEURO (CUSTOM PROCEDURE TRAY) ×2 IMPLANT
PAD ARMBOARD 7.5X6 YLW CONV (MISCELLANEOUS) ×6 IMPLANT
PIN DISTRACTION 14MM (PIN) ×5 IMPLANT
PLATE CERV CONS OZARK 3X54 (Plate) ×1 IMPLANT
PUTTY BONE 100 VESUVIUS 2.5CC (Putty) ×1 IMPLANT
SCREW FIXED ST OZARK 4X16 (Screw) ×2 IMPLANT
SCREW VA ST OZARK 4X14 (Screw) ×2 IMPLANT
SCREW VA ST OZARK 4X16 (Plate) ×2 IMPLANT
SPACER ANGLD CASCAD 16X13X7 7D (Spacer) ×1 IMPLANT
SPONGE INTESTINAL PEANUT (DISPOSABLE) ×3 IMPLANT
SPONGE SURGIFOAM ABS GEL SZ50 (HEMOSTASIS) ×1 IMPLANT
SPONGE T-LAP 4X18 ~~LOC~~+RFID (SPONGE) ×2 IMPLANT
STAPLER VISISTAT 35W (STAPLE) ×1 IMPLANT
STRIP CLOSURE SKIN 1/2X4 (GAUZE/BANDAGES/DRESSINGS) ×2 IMPLANT
TAPE SURG TRANSPORE 1 IN (GAUZE/BANDAGES/DRESSINGS) ×1 IMPLANT
TAPE SURGICAL TRANSPORE 1 IN (GAUZE/BANDAGES/DRESSINGS) ×2
TOWEL GREEN STERILE (TOWEL DISPOSABLE) ×2 IMPLANT
TOWEL GREEN STERILE FF (TOWEL DISPOSABLE) ×2 IMPLANT
TRAY FOLEY MTR SLVR 16FR STAT (SET/KITS/TRAYS/PACK) ×1 IMPLANT
WATER STERILE IRR 1000ML POUR (IV SOLUTION) ×2 IMPLANT

## 2021-06-02 NOTE — Anesthesia Postprocedure Evaluation (Signed)
Anesthesia Post Note  Patient: LOURA PITT  Procedure(s) Performed: ANTERIOR CERVICAL DISCECTOMY FUSION CERVICAL FOUR-FIVE, CERVICAL FIVE-SIX, CERVICAL SIX-SEVEN WITH CERVICAL SIX CORPECTOMY     Patient location during evaluation: PACU Anesthesia Type: General Level of consciousness: awake and alert Pain management: pain level controlled Vital Signs Assessment: post-procedure vital signs reviewed and stable Respiratory status: spontaneous breathing, nonlabored ventilation, respiratory function stable and patient connected to nasal cannula oxygen Cardiovascular status: blood pressure returned to baseline and stable Postop Assessment: no apparent nausea or vomiting Anesthetic complications: no   No notable events documented.  Last Vitals:  Vitals:   06/02/21 1545 06/02/21 1602  BP: (!) 145/93 (!) 150/95  Pulse: 84 86  Resp: 16 20  Temp: 36.5 C 36.5 C  SpO2: 95% 96%    Last Pain:  Vitals:   06/02/21 1602  TempSrc: Oral  PainSc:                  Anasia Agro L Kelyn Ponciano

## 2021-06-02 NOTE — Progress Notes (Signed)
Dr. Armond Hang notified of patient eating a bite of taco last night at 0030.

## 2021-06-02 NOTE — H&P (Signed)
    Providing Compassionate, Quality Care - Together  NEUROSURGERY HISTORY & PHYSICAL   Katrina Lambert is an 35 y.o. female.   Chief Complaint: BUE numbness/tingling  HPI: This is a 35 yo F with complaints of BUE numbness, tingling and difficulty walking. Workup revealed severe cervical stenosis C4-7 and she presents today for surgical intervention. She continues to confirm that she has falls, numbness and tingling in the hands and complains of neck pain, L>R. She denies any other new symptoms.  Past Medical History:  Diagnosis Date   Anxiety    Arthritis    bilateral hips and knees   Asthma    Bipolar disorder (HCC)    Depression    Dyspnea    GERD (gastroesophageal reflux disease)    Headache    Hypertension    Neuromuscular disorder (HCC)    PONV (postoperative nausea and vomiting)    Pre-diabetes     Past Surgical History:  Procedure Laterality Date   CESAREAN SECTION     x 2   CHOLECYSTECTOMY      History reviewed. No pertinent family history. Social History:  reports that she has been smoking cigarettes. She has a 33.00 pack-year smoking history. She has quit using smokeless tobacco. She reports current alcohol use. She reports current drug use. Drug: Marijuana.  Allergies: No Known Allergies  Medications Prior to Admission  Medication Sig Dispense Refill   acetaminophen (TYLENOL) 500 MG tablet Take 2,000 mg by mouth every 6 (six) hours as needed for mild pain.     amLODipine (NORVASC) 10 MG tablet Take 10 mg by mouth every morning.     IBU 800 MG tablet Take 800 mg by mouth every 8 (eight) hours as needed for pain.     lisinopril-hydrochlorothiazide (ZESTORETIC) 10-12.5 MG tablet Take 1 tablet by mouth daily.     metoprolol succinate (TOPROL-XL) 50 MG 24 hr tablet Take 50 mg by mouth every morning.      Results for orders placed or performed during the hospital encounter of 06/02/21 (from the past 48 hour(s))  Pregnancy, urine POC     Status: None   Collection  Time: 06/02/21  6:33 AM  Result Value Ref Range   Preg Test, Ur NEGATIVE NEGATIVE    Comment:        THE SENSITIVITY OF THIS METHODOLOGY IS >24 mIU/mL    No results found.  ROS All pos/neg are in hpi  Blood pressure (!) 177/93, pulse 85, temperature 98.3 F (36.8 C), temperature source Oral, resp. rate 18, height 5\' 5"  (1.651 m), weight 114.8 kg, SpO2 98 %. 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 We discussed all risks, benefits and expected outcomes. She agrees to proceed -possible posterior stage later this week pending todays surgical findings     Thank you for allowing me to participate in this patient's care.  Please do not hesitate to call with questions or concerns.   20, DO Neurosurgeon [facility] Cell: [phone]

## 2021-06-02 NOTE — Transfer of Care (Signed)
Immediate Anesthesia Transfer of Care Note  Patient: [name]  Procedure(s) Performed: ANTERIOR CERVICAL DISCECTOMY FUSION CERVICAL FOUR-FIVE, CERVICAL FIVE-SIX, CERVICAL SIX-SEVEN WITH CERVICAL SIX CORPECTOMY  Patient Location: PACU  Anesthesia Type:General  Level of Consciousness: awake, alert  and oriented  Airway & Oxygen Therapy: Patient Spontanous Breathing and Patient connected to face mask oxygen  Post-op Assessment: Report given to RN, Post -op Vital signs reviewed and stable and Patient moving all extremities X 4  Post vital signs: Reviewed and stable  Last Vitals:  Vitals Value Taken Time  BP 135/92 06/02/21 1324  Temp    Pulse 94 06/02/21 1326  Resp 18 06/02/21 1326  SpO2 93 % 06/02/21 1326  Vitals shown include unvalidated device data.  Last Pain:  Vitals:   06/02/21 0617  TempSrc:   PainSc: 5       Patients Stated Pain Goal: 3 (06/02/21 0617)  Complications: No notable events documented.

## 2021-06-02 NOTE — Progress Notes (Signed)
Orthopedic Tech Progress Note Patient Details:  Katrina Lambert [birth year] [id number] Aspen collar was given to patient's nurse for application  Ortho Devices Type of Ortho Device: Aspen cervical collar Ortho Device/Splint Interventions: Grant Fontana E [place] Hibberd 06/02/2021, 3:01 PM

## 2021-06-02 NOTE — Anesthesia Procedure Notes (Signed)
Procedure Name: Intubation Date/Time: 06/02/2021 7:49 AM Performed by: Elgie Congo, CRNA Pre-anesthesia Checklist: Patient identified, Emergency Drugs available, Suction available and Patient being monitored Patient Re-evaluated:Patient Re-evaluated prior to induction Oxygen Delivery Method: Circle system utilized Preoxygenation: Pre-oxygenation with 100% oxygen Induction Type: IV induction Ventilation: Mask ventilation without difficulty Laryngoscope Size: Glidescope Grade View: Grade I Tube type: Oral Tube size: 7.0 mm Number of attempts: 1 Airway Equipment and Method: Rigid stylet Placement Confirmation: ETT inserted through vocal cords under direct vision, positive ETCO2 and breath sounds checked- equal and bilateral Secured at: 20 cm Tube secured with: Tape Dental Injury: Teeth and Oropharynx as per pre-operative assessment

## 2021-06-02 NOTE — Progress Notes (Signed)
Pt seen and examined in Pacu. States numbness is improved   Doing well.  Neck soft Trachea midline Incision cdi HMV in place Mae equally Silt     35 yo F with  1. CSM   Sp C 4-7 ACDF w C6 corpectomy   Pt/ot  Pain control Doing well

## 2021-06-02 NOTE — Op Note (Signed)
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  POSTOP DIAGNOSIS: Same  PROCEDURE: Anterior cervical arthrodesis, C4-5, C5-6, C6-7 with K2M [place] plate and screws Complete C6 corpectomy for decompression of spinal cord, placement of Stryker corpectomy cage 12 x 14 mm, 22-28 mm height, capri titanium Anterior cervical discectomy, C4-5, C5-6, C6-7,  with K2M [place] titanium interbody 7 x 13 x 16 mm at C4-5 Intraoperative use of autograft, same incision site of Intraoperative use of allograft, vesuvius Intraoperative use of microscope, for microdissection of neural elements Intraoperative use of fluoroscopy, greater than 1 hour Intraoperative use of neuro monitoring, SSEPs, greater than 1 hour  SURGEON: Dr. Kendell Bane C. Prudencio Velazco, DO  ASSISTANT: Docia Barrier, NP  ANESTHESIA: General Endotracheal  EBL: 150 cc  SPECIMENS: None  DRAINS: Medium Hemovac  COMPLICATIONS: None  CONDITION: Hemodynamically stable  HISTORY: Katrina Lambert is a 35 y.o. female that presented to my office with complaints of neck pain, numbness and tingling in her upper extremities, difficulty walking and multiple falls.  On physical exam she had evidence of upper motor neuron disease therefore an MRI was obtained.  MRI revealed moderate to severe stenosis at C4-5, severe stenosis at C5-6 and C6-7 with moderate to severe stenosis posteriorly behind the C6 vertebral body.  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  I discussed with the patient the options of continued monitoring, anterior versus posterior decompression and fusion.  Given her significant bulky disease in front of the C6 vertebral body and sizable disc herniations at each level, I recommended an anterior cervical discectomy and fusion C4-7 with C6 corpectomy.  She agreed to proceed with  surgical intervention.  Also told her that she may need to be staged given the size of the surgery in order to appropriately decompress and fixate the cervical spine.  PROCEDURE IN DETAIL: The patient was brought to the operating room. After induction of general anesthesia, prepositioning SSEP signals were obtained and noted to be monitorable.  The patient was positioned on the operative table in the supine position and her neck was placed in gentle extension.  Post positional SSEPs were obtained, they were noted to actually be improved compared prepositional.  All pressure points were meticulously padded. Skin incision was then marked out and prepped and draped in the usual sterile fashion.  Using a 10 blade, a right-sided anterior incision was made between midline and the sternocleidomastoid.  Self-retaining retractor was placed in the wound.  The platysma was opened in vertical fashion using Metzenbaum scissors.  The platysma was moderately undermined in all directions.  The sternocleidomastoid and omohyoid muscles were identified.  Using natural fascial plane, the medial structures, trachea and esophagus were gently retracted medially and the carotid laterally to visualize the prevertebral space.  The prevertebral fascia was opened sharply.  The space was dissected to expose the C4, C5 and C6 and C7 vertebral bodies.  Using lateral fluoroscopy in the C4-5 disc base was confirmed.  At this point the microscope was sterilely draped and brought into the field.  The longus coli were bilaterally elevated subperiosteally using Bovie electrocautery from C4-C7.  Self-retaining retractor was placed at the C4-5 disc base under the longus coli bilaterally.  Midline distraction pins were placed in the C4 and C5 vertebral bodies.  Using a 15 blade, the disc was cut at C4-5.  Using a series of  Kerrison rongeurs and pituitary rongeurs a radical discectomy was performed to the bilateral uncovertebral joints.  The disc  space was placed on distraction.  Using a high-speed drill remainder of the discectomy was performed.  Posterior osteophytes were aggressively removed with a high-speed drill behind the C4 and C5 vertebral bodies.  The PLL was visualized.  Using micro curettes the PLL was gently elevated and the thecal sac was identified.  Using micro curettes and Kerrison rongeurs the PLL was resected bilaterally to decompress the spinal cord and neuroforamina bilaterally.  The disc base was taken out of distraction.  The endplates were prepared to subchondral bleeding bone using curettes.  Using micro nerve root, bilateral neuroforamen were felt to be appropriately decompressed.  The thecal sac was clearly decompressed.  Epidural hemostasis was achieved with Surgiflo.  At this point the disc base was trialed and the appropriate size interbody was selected as above.  This was placed under direct visualization and confirmed with lateral fluoroscopy.  The interbody was in appropriate placement at the C4-5 disc space.  The distraction pin was removed from the C4 vertebral body, hemostasis was achieved with Surgiflo.  The distraction pin was placed in the C6 vertebral body at midline.  The self-retaining retractors were placed under the longus coli bilaterally at the C5-6 disc base.  The anterior osteophytes were removed with osteophyte rongeur.  The disc base was incised with 15 blade. Using a series of Kerrison rongeurs and pituitary rongeurs a radical discectomy was performed to the bilateral uncovertebral joints.  The disc space was placed in distraction.  Using a high-speed drill remainder of the discectomy was performed.  Posterior osteophytes were aggressively removed with a high-speed drill behind the C5 and C6 vertebral bodies.  The PLL was visualized.  Using micro curettes and Kerrison rongeurs the PLL was elevated and resected bilaterally to decompress the spinal cord and neuroforamina bilaterally.  The thecal sac was  visualized.  The disc base was taken out of distraction.  Using micro nerve root, bilateral neuroforamen were felt to be appropriately decompressed.  The thecal sac was clearly decompressed.  Epidural hemostasis was achieved with Surgiflo. Of note there was a large lateral disc herniation in the right lateral recess that was slightly adherent to the thecal sac and slightly calcified.  This was carefully elevated and resected with microcurette's and Kerrison rongeurs.  Distraction pin was removed from C6 vertebral body and hemostasis with Surgiflo.  This was placed in the C7 vertebral body at midline.  Attention was then turned to the C6-7 discectomy.  Using 15 blade, the annulus was incised.  Using curettes and pituitary rongeurs a radical discectomy was performed bilaterally to the uncovertebral joints.  The space was placed on distraction. Using a high-speed drill remainder of the discectomy was performed.  Posterior osteophytes were aggressively removed with a high-speed drill behind the C6 and C7 vertebral bodies.  The PLL was visualized.  Using micro curettes and Kerrison rongeurs the PLL was elevated on the right lateral recess.  Of note the PLL was quite thickened and calcified at midline and in the left lateral recess.  There was a large osteophyte that I drilled down with a high-speed drill in the left lateral recess.  The PLL was quite calcified and stuck therefore I cannot find an appropriate plane on the left lateral recess at this level.  The disc base was taken out of distraction.  The thecal sac was clearly decompressed at midline and in the right  lateral recess.  Given the calcification and adherence to the dura from the PLL, I did not feel it was prudent to continue with removal of the calcified PLL on the left.  Epidural hemostasis was achieved with Surgiflo.  The C7 superior endplate was prepared with a high-speed drill down to subchondral bleeding bone.  Hemostasis was achieved with  Surgiflo.  Attention was then turned to the C6 corpectomy.  Using a high-speed drill the entire vertebral body was removed while capturing the bone dust for allograft, connecting the two discectomy at C5-6 and C6-7.  This was performed down to the PLL.  The PLL was quite thickened and adherent to the dura.  The osteophyte behind the C6 vertebral body superiorly was carefully dissected and removed from the thecal sac with micro curettes and Kerrison rongeurs.  The thecal sac was clear decompressed and pulsatile.  Surgifoam was used for hemostasis.  The epidural space was hemostatic.  Distraction pins were removed and hemostasis was achieved with Surgiflo.  I then used an appropriate sizer for the corpectomy cage and confirmed the appropriate size with lateral fluoroscopy.  A corpectomy cage was then filled with autograft and allograft and placed under live fluoroscopy.  There was expanded under live fluoroscopy and lordosis was added.  The corpectomy cage was then final tightened to the manufacturer's recommendation.  Attention was then turned to placement of the cervical plate.  An appropriate size plate was placed, using a high-speed drill screws were placed in the C4 vertebral body, C5 vertebral body and C7 vertebral body bilaterally.  Lateral fluoroscopy confirmed appropriate placement.  The cervical plate and screws were final tightened to the manufacturer's recommendation.  Self-retaining retractors were taken out of the wound.  The wound was explored and hemostasis was achieved with passive hemostatics and bipolar cautery.  The wound was copiously irrigated and noted to be hemostatic.  A medium Hemovac was tunneled laterally and placed in the prevertebral space.  Kenalog was placed along the lateral esophagus.  The skin was closed with staples.  Sterile dressing was applied.  Of note throughout the entirety of the case, the SSEPs remained stable.  In fact after the complete decompression and  corpectomy, the neuro monitoring team notified me that the signals improved slightly compared to post positioning signals which were already improved from prepositioning.  At the end of the case all sponge, needle, and instrument counts were correct. The patient was then transferred to the stretcher, extubated, and taken to the post-anesthesia care unit in stable hemodynamic condition.

## 2021-06-03 NOTE — Progress Notes (Addendum)
Subjective: Patient reports that she is doing well. She has some mild right periscapular pain that is well controlled on PO analgesics. No acute events overnight.   Objective: Vital signs in last 24 hours: Temp:  [97.2 F (36.2 C)-98.4 F (36.9 C)] 97.7 F (36.5 C) (07/19 0744) Pulse Rate:  [74-99] 84 (07/19 0744) Resp:  [14-20] 17 (07/19 0744) BP: (131-175)/(81-114) 155/96 (07/19 0744) SpO2:  [92 %-96 %] 94 % (07/19 0744)  Intake/Output from previous day: 07/18 0701 - 07/19 0700 In: 1200 [I.V.:1200] Out: 2210 [Urine:2010; Blood:200] Intake/Output this shift: No intake/output data recorded.  Physical Exam: Patient is awake, A/O X 4, conversant, and in good spirits. She is in NAD and VSS. Speech is fluent and appropriate. Doing well. MAEW. Right HI and FE 4/5. Sensation to light touch is intact. PERLA, EOMI. CNs grossly intact. Hard cervical collar in place. Dressing is intact with small amount of old drainage. Incision is well approximated with no drainage, erythema, or edema. Drain with approximately 200 ml of sanguinous output overnight.      Lab Results: Recent Labs    06/02/21 1659  WBC 20.3*  HGB 15.4*  HCT 47.9*  PLT 408*   BMET Recent Labs    06/02/21 1659  CREATININE 0.74    Studies/Results: DG Cervical Spine 2 or 3 views  Result Date: 06/02/2021 CLINICAL DATA:  Elective surgery. Additional history provided: Posterior cervical instrumentation and fusion C4-T2, possible laminectomy/decompression C4-C7. Provided fluoroscopy time 38 seconds (26.36 mGy). EXAM: CERVICAL SPINE - 2-3 VIEW; DG C-ARM 1-60 MIN COMPARISON:  Cervical spine CT 03/07/2021. FINDINGS: PA and lateral view intraoperative fluoroscopic images of the cervical spine are submitted, 3 images total. On the provided images, ACDF hardware now spans the C4-C7 levels. An interbody device is now present at C4-C5. There has also been interval C6 corpectomy. Partially visualized ET tube. IMPRESSION: Three  intraoperative fluoroscopic images of the cervical spine, as described. Electronically Signed   By: Jackey Loge DO   On: 06/02/2021 13:11   DG C-Arm 1-60 Min  Result Date: 06/02/2021 CLINICAL DATA:  Elective surgery. Additional history provided: Posterior cervical instrumentation and fusion C4-T2, possible laminectomy/decompression C4-C7. Provided fluoroscopy time 38 seconds (26.36 mGy). EXAM: CERVICAL SPINE - 2-3 VIEW; DG C-ARM 1-60 MIN COMPARISON:  Cervical spine CT 03/07/2021. FINDINGS: PA and lateral view intraoperative fluoroscopic images of the cervical spine are submitted, 3 images total. On the provided images, ACDF hardware now spans the C4-C7 levels. An interbody device is now present at C4-C5. There has also been interval C6 corpectomy. Partially visualized ET tube. IMPRESSION: Three intraoperative fluoroscopic images of the cervical spine, as described. Electronically Signed   By: Jackey Loge DO   On: 06/02/2021 13:11    Assessment/Plan: 35 year old female with cervical stenosis with myelopathy is post-op day 1 s/p C4-7  ACDF C4-7 today with C6 corpectomy. She is recovering well and reports a s9ignificant decrease in her preoperative symptoms.  Her only complaint is mild incisional and right periscapular pain that is well controlled with PO analgesics. Plan for posterior cervical fixation on Friday with Dr. Jake Samples. Continue hard cervical collar.  Will leave drain in for today.  Continue working on pain control, mobility and ambulating patient.    Council Mechanic, DNP, NP-C 06/03/2021, 9:16 AM   Addendum:  Patient seen and examined.  Agree with above.  Overall she is doing very well.  Her numbness and tingling from preop is significantly improved, resolved.  She is ambulating well.  She is scheduled for posterior cervical instrumentation and fusion C4-T1 for Friday.  I discussed with her that I do believe this is appropriate given the significant size of her construct from  anteriorly.  Also explained to her that given some of her calcified disc herniations and OPLL that at C6-7 I could not completely finish the decompression anteriorly therefore I believe a laminectomy at C6-7 as well would be appropriate.  I discussed all the risks, benefits and expected outcomes with her, she agrees to proceed.  We also discussed her quitting smoking as this is a sizable risk for nonunion.  She verbalized her understanding.   Thank you for allowing me to participate in this patient's care.  Please do not hesitate to call with questions or concerns.   Monia Pouch, DO Neurosurgeon [facility] Cell: [phone]

## 2021-06-03 NOTE — Evaluation (Signed)
Physical Therapy Evaluation and Discharge Patient Details Name: Katrina Lambert MRN: [id number] DOB: 1986/06/15 Today's Date: 06/03/2021   History of Present Illness  Pt is a 35 y/o female who presents s/p C3-7 ACDF. Plan for second posterior cervical surgery 06/06/2021. PMH significant for anxiety, depression, bipolar disorder, and neuromuscular disorder.  Clinical Impression  Patient evaluated by Physical Therapy with no further acute PT needs identified. All education has been completed and the patient has no further questions. Pt was able to demonstrate transfers and ambulation with gross modified independence and RW for support. Feel pt could likely manage well without the RW, however pt prefers utilizing it for comfort as she has had episodes of LLE buckling in the past (none noted this session, however). With second cervical surgery Friday and a planned lumbar surgery in the near future, feel it is reasonable to go ahead and provide the pt with a RW now. She was educated on precautions, brace application/wearing schedule, appropriate activity progression, and car transfer. I do not anticipate pt will require another PT session after surgery on Friday, however please reorder if needs change. See below for any follow-up Physical Therapy or equipment needs. PT is signing off. Thank you for this referral.     Follow Up Recommendations No PT follow up;Supervision - Intermittent    Equipment Recommendations  Rolling walker with 5" wheels    Recommendations for Other Services       Precautions / Restrictions Precautions Precautions: Cervical;Fall Precaution Booklet Issued: Yes (comment) Precaution Comments: Reviewed handout and pt was cued for precautions during functional mobility. Required Braces or Orthoses: Cervical Brace Cervical Brace: Hard collar (does not have to wear brace in bed, to restroom, or in shower.) Restrictions Weight Bearing Restrictions: No      Mobility  Bed  Mobility Overal bed mobility: Modified Independent             General bed mobility comments: Pt sitting up in bed and reports she will not be laying flat. VC's for log roll if she happens to sleep supine or sidelying in the bed.    Transfers Overall transfer level: Modified independent Equipment used: Rolling walker (2 wheeled)             General transfer comment: Mod I - no assist required and no difficulty powering up to full stand.  Ambulation/Gait Ambulation/Gait assistance: Modified independent (Device/Increase time) Gait Distance (Feet): 400 Feet Assistive device: Rolling walker (2 wheeled) Gait Pattern/deviations: Step-through pattern;Decreased stride length;Trunk flexed Gait velocity: Decreased Gait velocity interpretation: 1.31 - 2.62 ft/sec, indicative of limited community ambulator General Gait Details: VC's for improved posture, closer walker proximity, and forward gaze. No unsteadiness or LOB noted.  Stairs Stairs: Yes Stairs assistance: Modified independent (Device/Increase time) Stair Management: Two rails;Step to pattern;Forwards Number of Stairs: 4 General stair comments: VC's for general safety and sequencing to protect LLE that has buckled on her in the past. No episodes of buckling noted throughout stair training.  Wheelchair Mobility    Modified Rankin (Stroke Patients Only)       Balance Overall balance assessment: No apparent balance deficits (not formally assessed)                                           Pertinent Vitals/Pain Pain Assessment: Faces Faces Pain Scale: Hurts a little bit Pain Location: neck Pain Descriptors / Indicators: Operative  site guarding Pain Intervention(s): Limited activity within patient's tolerance;Monitored during session;Repositioned    Home Living Family/patient expects to be discharged to:: Private residence Living Arrangements: Children Available Help at Discharge: Family Type of  Home: Mobile home Home Access: Stairs to enter Entrance Stairs-Rails: Right;Left;Can reach both Entrance Stairs-Number of Steps: 4 Home Layout: One level Home Equipment: Walker - 2 wheels Additional Comments: Pt reporting she usually sits in the tub. Educated to avoid sitting in the floor of tub while healing and recommending BSC    Prior Function Level of Independence: Independent               Hand Dominance        Extremity/Trunk Assessment   Upper Extremity Assessment Upper Extremity Assessment: Defer to OT evaluation    Lower Extremity Assessment Lower Extremity Assessment: Overall WFL for tasks assessed    Cervical / Trunk Assessment Cervical / Trunk Assessment: Other exceptions Cervical / Trunk Exceptions: s/p cervical surgery  Communication   Communication: No difficulties  Cognition Arousal/Alertness: Awake/alert Behavior During Therapy: WFL for tasks assessed/performed Overall Cognitive Status: Within Functional Limits for tasks assessed                                 General Comments: Pt recalling use of log roll technique and recalling 3/3 cervical spine precautions upon OT arrival.      General Comments General comments (skin integrity, edema, etc.): Pt pleasant and conversational throughout. Verbalizing and demonstrating understanding of all education provided.    Exercises     Assessment/Plan    PT Assessment Patent does not need any further PT services  PT Problem List         PT Treatment Interventions      PT Goals (Current goals can be found in the Care Plan section)  Acute Rehab PT Goals Patient Stated Goal: go home PT Goal Formulation: All assessment and education complete, DC therapy    Frequency     Barriers to discharge        Co-evaluation               AM-PAC PT "6 Clicks" Mobility  Outcome Measure Help needed turning from your back to your side while in a flat bed without using bedrails?:  None Help needed moving from lying on your back to sitting on the side of a flat bed without using bedrails?: None Help needed moving to and from a bed to a chair (including a wheelchair)?: None Help needed standing up from a chair using your arms (e.g., wheelchair or bedside chair)?: None Help needed to walk in hospital room?: None Help needed climbing 3-5 steps with a railing? : None 6 Click Score: 24    End of Session Equipment Utilized During Treatment: Cervical collar;Gait belt Activity Tolerance: Patient tolerated treatment well Patient left: in bed;with call bell/phone within reach Nurse Communication: Mobility status PT Visit Diagnosis: Unsteadiness on feet (R26.81);Pain Pain - part of body:  (neck)    Time: 5643-3295 PT Time Calculation (min) (ACUTE ONLY): 23 min   Charges:   PT Evaluation $PT Eval Low Complexity: 1 Low PT Treatments $Gait Training: 8-22 mins        Conni Slipper, PT, DPT Acute Rehabilitation Services Pager: [phone] Office: [phone]   Marylynn Pearson 06/03/2021, 10:12 AM

## 2021-06-03 NOTE — Evaluation (Signed)
Occupational Therapy Evaluation Patient Details Name: Katrina Lambert MRN: [id number] DOB: 15-Sep-1986 Today's Date: 06/03/2021    History of Present Illness 35 yo female s/p C3-7 ACDF. PMH significant for anxiety, arthritis, depression, bipolar disorder, and neuromuscular disorder.   Clinical Impression   PTA, pt was independent and lived with her teenage daughters. Curently, pt performing all ADLs with Mod I. Pt educated and demonstrating use of compensatory techniques for LB dressing, UB dressing, toileting, brace applicatin, and shower transfers. All questions answered. Recommend discharge home with no follow up OT. Re-consult if change in status. OT to sign off.     Follow Up Recommendations  No OT follow up    Equipment Recommendations  3 in 1 bedside commode    Recommendations for Other Services       Precautions / Restrictions Precautions Precautions: Cervical Precaution Booklet Issued: Yes (comment) Precaution Comments: Pt recalling 3/3 precautions Required Braces or Orthoses: Cervical Brace Cervical Brace: Hard collar (does not have to wear brace in bed, to restroom, or in shower.) Restrictions Weight Bearing Restrictions: No      Mobility Bed Mobility Overal bed mobility: Modified Independent             General bed mobility comments: Pt recalling log roll technique    Transfers Overall transfer level: Modified independent Equipment used: Rolling walker (2 wheeled)             General transfer comment: Mod I for increased time.    Balance                                           ADL either performed or assessed with clinical judgement   ADL Overall ADL's : Modified independent                                       General ADL Comments: Pt performing ADLs with mod I. Pt educated and demonstrating use of compensatory techniques for LB dresssing, UB dressing, shower transfers, toileting, oral care, and brace  application.     Vision   Vision Assessment?: No apparent visual deficits     Perception Perception Perception Tested?: No Comments: no apparent difficulty with perception   Praxis Praxis Praxis tested?: Not tested Praxis-Other Comments: no apparent difficulty with motor planning.    Pertinent Vitals/Pain Pain Assessment: Faces Faces Pain Scale: Hurts a little bit Pain Location: neck Pain Descriptors / Indicators: Operative site guarding Pain Intervention(s): Monitored during session     Hand Dominance     Extremity/Trunk Assessment Upper Extremity Assessment Upper Extremity Assessment: Overall WFL for tasks assessed   Lower Extremity Assessment Lower Extremity Assessment: Defer to PT evaluation   Cervical / Trunk Assessment Cervical / Trunk Assessment: Other exceptions Cervical / Trunk Exceptions: s/p cervical surgery   Communication Communication Communication: No difficulties   Cognition Arousal/Alertness: Awake/alert Behavior During Therapy: WFL for tasks assessed/performed Overall Cognitive Status: Within Functional Limits for tasks assessed                                 General Comments: Pt recalling use of log roll technique and recalling 3/3 cervical spine precautions upon OT arrival.   General Comments  Pt pleasant and conversational throughout.  Verbalizing and demonstrating understanding of all education provided.    Exercises     Shoulder Instructions      Home Living Family/patient expects to be discharged to:: Private residence Living Arrangements: Children Available Help at Discharge: Family Type of Home: Mobile home Home Access: Stairs to enter Entrance Stairs-Number of Steps: 4 Entrance Stairs-Rails: Right;Left;Can reach both Home Layout: One level     Bathroom Shower/Tub: Tub/shower unit;Walk-in shower   Bathroom Toilet: Standard     Home Equipment: Walker - 2 wheels   Additional Comments: Pt reporting she usually  sits in the tub. Educated to avoid sitting in the floor of tub while healing and recommending BSC      Prior Functioning/Environment Level of Independence: Independent                 OT Problem List: Decreased strength;Decreased activity tolerance;Impaired balance (sitting and/or standing);Decreased knowledge of precautions;Pain      OT Treatment/Interventions:      OT Goals(Current goals can be found in the care plan section) Acute Rehab OT Goals Patient Stated Goal: go home OT Goal Formulation: With patient  OT Frequency:     Barriers to D/C:            Co-evaluation              AM-PAC OT "6 Clicks" Daily Activity     Outcome Measure Help from another person eating meals?: None Help from another person taking care of personal grooming?: None Help from another person toileting, which includes using toliet, bedpan, or urinal?: None Help from another person bathing (including washing, rinsing, drying)?: None Help from another person to put on and taking off regular upper body clothing?: None Help from another person to put on and taking off regular lower body clothing?: None 6 Click Score: 24   End of Session Equipment Utilized During Treatment: Gait belt;Rolling walker;Cervical collar Nurse Communication: Mobility status  Activity Tolerance: Patient tolerated treatment well Patient left: in bed;with call bell/phone within reach  OT Visit Diagnosis: Unsteadiness on feet (R26.81);Muscle weakness (generalized) (M62.81);Pain Pain - part of body:  (neck)                Time: 9379-0240 OT Time Calculation (min): 18 min Charges:  OT General Charges $OT Visit: 1 Visit OT Evaluation $OT Eval Low Complexity: 1 Low  Ladene Artist, OTDS   Ladene Artist 06/03/2021, 8:23 AM

## 2021-06-04 ENCOUNTER — Encounter (HOSPITAL_COMMUNITY): Payer: Self-pay | Admitting: Neurological Surgery

## 2021-06-04 NOTE — Progress Notes (Addendum)
Subjective: Patient reports that she is recovering well. She has c/o mild right shoulder and right parascapular pain. No acute events overnight.   Objective: Vital signs in last 24 hours: Temp:  [97.6 F (36.4 C)-98.1 F (36.7 C)] 97.8 F (36.6 C) (07/20 0720) Pulse Rate:  [79-91] 80 (07/20 0720) Resp:  [16-19] 16 (07/20 0720) BP: (136-167)/(82-97) 156/96 (07/20 0720) SpO2:  [95 %-97 %] 96 % (07/20 0720)  Intake/Output from previous day: 07/19 0701 - 07/20 0700 In: -  Out: 20 [Drains:20] Intake/Output this shift: No intake/output data recorded.  Physical Exam: Patient is awake, A/O X 4, conversant, and in good spirits. She is in NAD and VSS. Speech is fluent and appropriate. Doing well. MAEW. Right HI and FE 4+/5. Sensation to light touch is intact. PERLA, EOMI. CNs grossly intact. Hard cervical collar in place. Dressing is intact with small amount of old drainage. Incision is well approximated with no drainage, erythema, or edema. Drain with approximately 10 ml of serosanguinous output overnight.   Lab Results: Recent Labs    06/02/21 1659  WBC 20.3*  HGB 15.4*  HCT 47.9*  PLT 408*   BMET Recent Labs    06/02/21 1659  CREATININE 0.74    Studies/Results: DG Cervical Spine 2 or 3 views  Result Date: 06/02/2021 CLINICAL DATA:  Elective surgery. Additional history provided: Posterior cervical instrumentation and fusion C4-T2, possible laminectomy/decompression C4-C7. Provided fluoroscopy time 38 seconds (26.36 mGy). EXAM: CERVICAL SPINE - 2-3 VIEW; DG C-ARM 1-60 MIN COMPARISON:  Cervical spine CT 03/07/2021. FINDINGS: PA and lateral view intraoperative fluoroscopic images of the cervical spine are submitted, 3 images total. On the provided images, ACDF hardware now spans the C4-C7 levels. An interbody device is now present at C4-C5. There has also been interval C6 corpectomy. Partially visualized ET tube. IMPRESSION: Three intraoperative fluoroscopic images of the cervical  spine, as described. Electronically Signed   By: Jackey Loge DO   On: 06/02/2021 13:11   DG C-Arm 1-60 Min  Result Date: 06/02/2021 CLINICAL DATA:  Elective surgery. Additional history provided: Posterior cervical instrumentation and fusion C4-T2, possible laminectomy/decompression C4-C7. Provided fluoroscopy time 38 seconds (26.36 mGy). EXAM: CERVICAL SPINE - 2-3 VIEW; DG C-ARM 1-60 MIN COMPARISON:  Cervical spine CT 03/07/2021. FINDINGS: PA and lateral view intraoperative fluoroscopic images of the cervical spine are submitted, 3 images total. On the provided images, ACDF hardware now spans the C4-C7 levels. An interbody device is now present at C4-C5. There has also been interval C6 corpectomy. Partially visualized ET tube. IMPRESSION: Three intraoperative fluoroscopic images of the cervical spine, as described. Electronically Signed   By: Jackey Loge DO   On: 06/02/2021 13:11    Assessment/Plan: 35 year old female with cervical stenosis with myelopathy is post-op day 2 s/p C4-7  ACDF C4-7 today with C6 corpectomy. She is continuing to recover well. Her only complaint is mild right shoulder and right periscapular pain that is well controlled with PO analgesics.   -Plan posterior cervical instrumentation and fusion at C4-T1 with potential laminectomy at C6-7 on Friday with Dr. Jake Samples.  -Continue hard cervical collar.   -Continue working on pain control, mobility and ambulating patient.  -Remove drain today    LOS: 2 days     Council Mechanic, DNP, NP-C 06/04/2021, 8:22 AM  Addendum:    Patient seen and examined.  Agree with above   Continue plan for stage II posterior cervical instrumentation and decompression on Friday   Thank you for allowing me to  participate in this patient's care.  Please do not hesitate to call with questions or concerns.   Monia Pouch, DO Neurosurgeon [facility] Cell: [phone]

## 2021-06-05 LAB — CBC
HCT: 48.2 % — ABNORMAL HIGH (ref 36.0–46.0)
Hemoglobin: 15.8 g/dL — ABNORMAL HIGH (ref 12.0–15.0)
MCH: 28.2 pg (ref 26.0–34.0)
MCHC: 32.8 g/dL (ref 30.0–36.0)
MCV: 85.9 fL (ref 80.0–100.0)
Platelets: 380 10*3/uL (ref 150–400)
RBC: 5.61 MIL/uL — ABNORMAL HIGH (ref 3.87–5.11)
RDW: 14.2 % (ref 11.5–15.5)
WBC: 17.8 10*3/uL — ABNORMAL HIGH (ref 4.0–10.5)
nRBC: 0 % (ref 0.0–0.2)

## 2021-06-05 LAB — APTT: aPTT: 27 seconds (ref 24–36)

## 2021-06-05 LAB — PROTIME-INR
INR: 0.9 (ref 0.8–1.2)
Prothrombin Time: 12.2 seconds (ref 11.4–15.2)

## 2021-06-05 LAB — BASIC METABOLIC PANEL
Anion gap: 9 (ref 5–15)
BUN: 15 mg/dL (ref 6–20)
CO2: 27 mmol/L (ref 22–32)
Calcium: 9.4 mg/dL (ref 8.9–10.3)
Chloride: 97 mmol/L — ABNORMAL LOW (ref 98–111)
Creatinine, Ser: 0.63 mg/dL (ref 0.44–1.00)
GFR, Estimated: >60 mL/min (ref >60)
Glucose, Bld: 90 mg/dL (ref 70–99)
Potassium: 3.9 mmol/L (ref 3.5–5.1)
Sodium: 133 mmol/L — ABNORMAL LOW (ref 135–145)

## 2021-06-05 MED ORDER — CEFAZOLIN SODIUM-DEXTROSE 2-4 GM/100ML-% IV SOLN: 2.0000 g | INTRAVENOUS | Status: DC

## 2021-06-05 MED ORDER — CHLORHEXIDINE GLUCONATE CLOTH 2 % EX PADS: 6.0000 | MEDICATED_PAD | Freq: Once | CUTANEOUS | Status: AC

## 2021-06-05 MED ORDER — CEFAZOLIN SODIUM-DEXTROSE 2-4 GM/100ML-% IV SOLN
2.0000 g | INTRAVENOUS | Status: AC
  Administered 2021-06-06: 2 g via INTRAVENOUS

## 2021-06-05 MED ORDER — MELATONIN 5 MG PO TABS
10.0000 mg | ORAL_TABLET | Freq: Every evening | ORAL | Status: DC | PRN
  Administered 2021-06-05 – 2021-06-08 (×3): 10 mg via ORAL
  Filled 2021-06-05 (×3): qty 2

## 2021-06-05 MED ORDER — CHLORHEXIDINE GLUCONATE CLOTH 2 % EX PADS
6.0000 | MEDICATED_PAD | Freq: Once | CUTANEOUS | Status: AC
  Administered 2021-06-05: 6 via TOPICAL

## 2021-06-05 MED ORDER — MELATONIN 3 MG PO TABS: 3.0000 mg | ORAL_TABLET | Freq: Every day | ORAL | Status: DC

## 2021-06-05 NOTE — Progress Notes (Addendum)
Subjective: Patient reports that she is doing good and is ready to proceed with the second part of her surgery. She continues to have posterior neck and right shoulder discomfort. No acute events overnight.   Objective: Vital signs in last 24 hours: Temp:  [97.7 F (36.5 C)-98.1 F (36.7 C)] 97.7 F (36.5 C) (07/21 0759) Pulse Rate:  [63-99] 84 (07/21 0759) Resp:  [16-20] 19 (07/21 0759) BP: (146-174)/(82-102) 151/96 (07/21 0759) SpO2:  [90 %-98 %] 95 % (07/21 0759)  Intake/Output from previous day: 07/20 0701 - 07/21 0700 In: 840 [P.O.:840] Out: -  Intake/Output this shift: No intake/output data recorded.  Physical Exam: Patient is awake, A/O X 4, conversant, and in good spirits. She is in NAD and VSS. Speech is fluent and appropriate. Doing well. MAEW. Right HI and FE 4/5. Sensation to light touch is intact. PERLA, EOMI. CNs grossly intact. Hard cervical collar in place. Dressing is intact with small amount of old drainage. Incision is well approximated with no drainage, erythema, or edema. Drain removed yesterday.   Lab Results: Recent Labs    06/02/21 1659  WBC 20.3*  HGB 15.4*  HCT 47.9*  PLT 408*   BMET Recent Labs    06/02/21 1659  CREATININE 0.74    Studies/Results: No results found.  Assessment/Plan: 35 year old female with cervical stenosis with myelopathy is post-op day 3 s/p C4-7  ACDF C4-7 today with C6 corpectomy. She is continuing to recover well and is eager to finish the second part of her surgery.  Her only complaint is mild incisional and right periscapular pain that is well controlled with PO analgesics. Plan for posterior cervical fixation on Friday with Dr. Jake Samples. Continue hard cervical collar. Continue working on pain control, mobility and ambulating patient.     LOS: 3 days     Council Mechanic, DNP, NP-C 06/05/2021, 9:13 AM   Addendum:  Pt s/e Agree with above  Patient requesting melatonin  Pt doing very well after ACDF C4-7 w C6  corpectomy  Planned posterior C4-T1 fusion tomorrow, d/w patient and answered all of her questions   Thank you for allowing me to participate in this patient's care.  Please do not hesitate to call with questions or concerns.   Monia Pouch, DO Neurosurgeon [facility] Cell: [phone]

## 2021-06-06 ENCOUNTER — Encounter (HOSPITAL_COMMUNITY)
Admission: RE | Disposition: A | Discharge status: Home / Self Care | Payer: Self-pay | Source: Home / Self Care | Attending: Neurological Surgery

## 2021-06-06 ENCOUNTER — Inpatient Hospital Stay (HOSPITAL_COMMUNITY): Payer: Medicaid Other

## 2021-06-06 ENCOUNTER — Inpatient Hospital Stay (HOSPITAL_COMMUNITY): Payer: Medicaid Other | Admitting: Certified Registered Nurse Anesthetist

## 2021-06-06 ENCOUNTER — Inpatient Hospital Stay (HOSPITAL_COMMUNITY): Admission: RE | Admit: 2021-06-06 | Payer: Medicaid Other | Source: Home / Self Care | Admitting: Neurological Surgery

## 2021-06-06 HISTORY — PX: POSTERIOR CERVICAL FUSION/FORAMINOTOMY: SHX5038

## 2021-06-06 IMAGING — RF DG CERVICAL SPINE 2 OR 3 VIEWS
1 series · 3 of 3 positions shown · non-contrast
Comparison: Intraoperative fluoroscopic images of the cervical
spine [DATE]. CT of the cervical spine [DATE].

CLINICAL DATA: Surgery, elective [QF] ([QF]-CM). Additional
history provided: Cervical 4-5, cervical 5-6, cervical 6-7, cervical
7-thoracic 1 posterior cervical instrumentation and fusion with
cervical 4-cervical 7 laminectomy and decompression. Provided
fluoroscopy time 7 seconds (3.16 mGy).

EXAM:
CERVICAL SPINE - 2-3 VIEW; DG C-ARM 1-60 MIN

[Series 1: run · 3 of 3 slices shown]
[im 1/3]
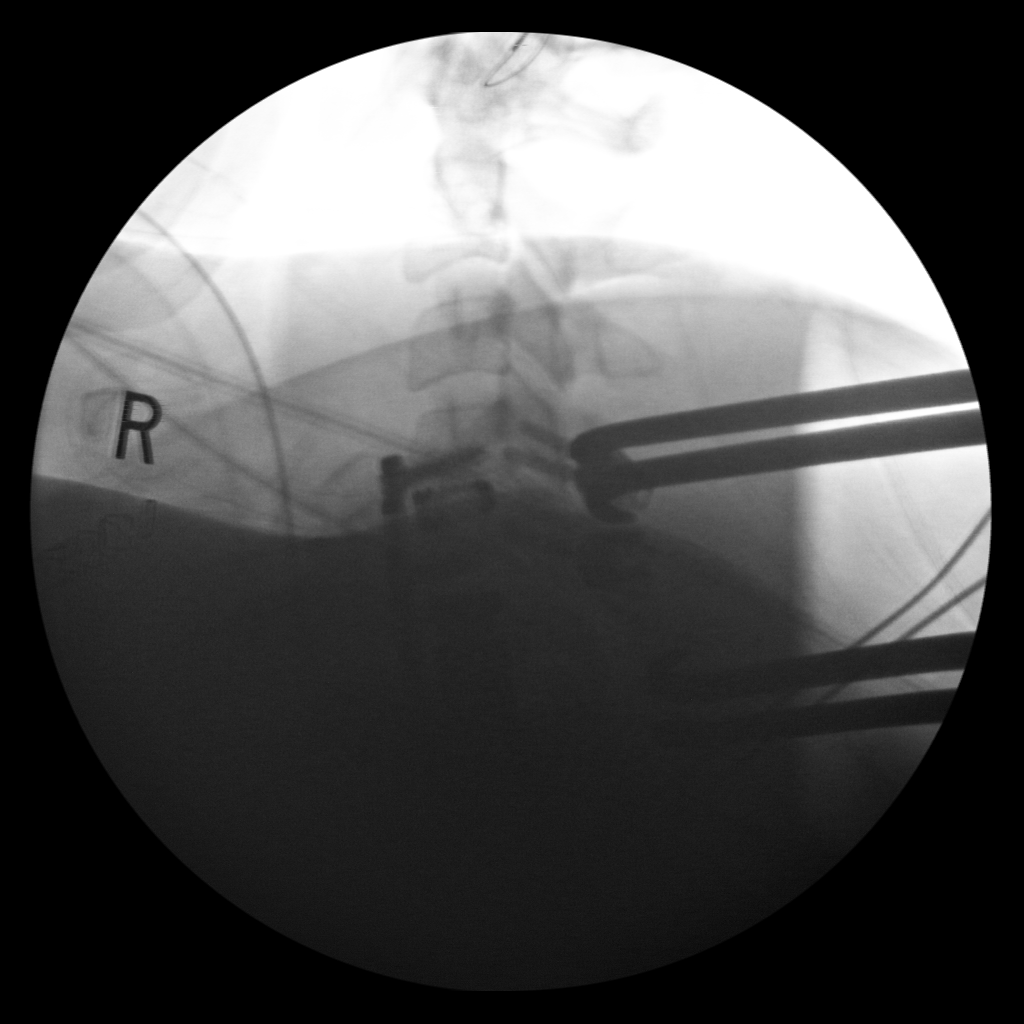
[im 2/3]
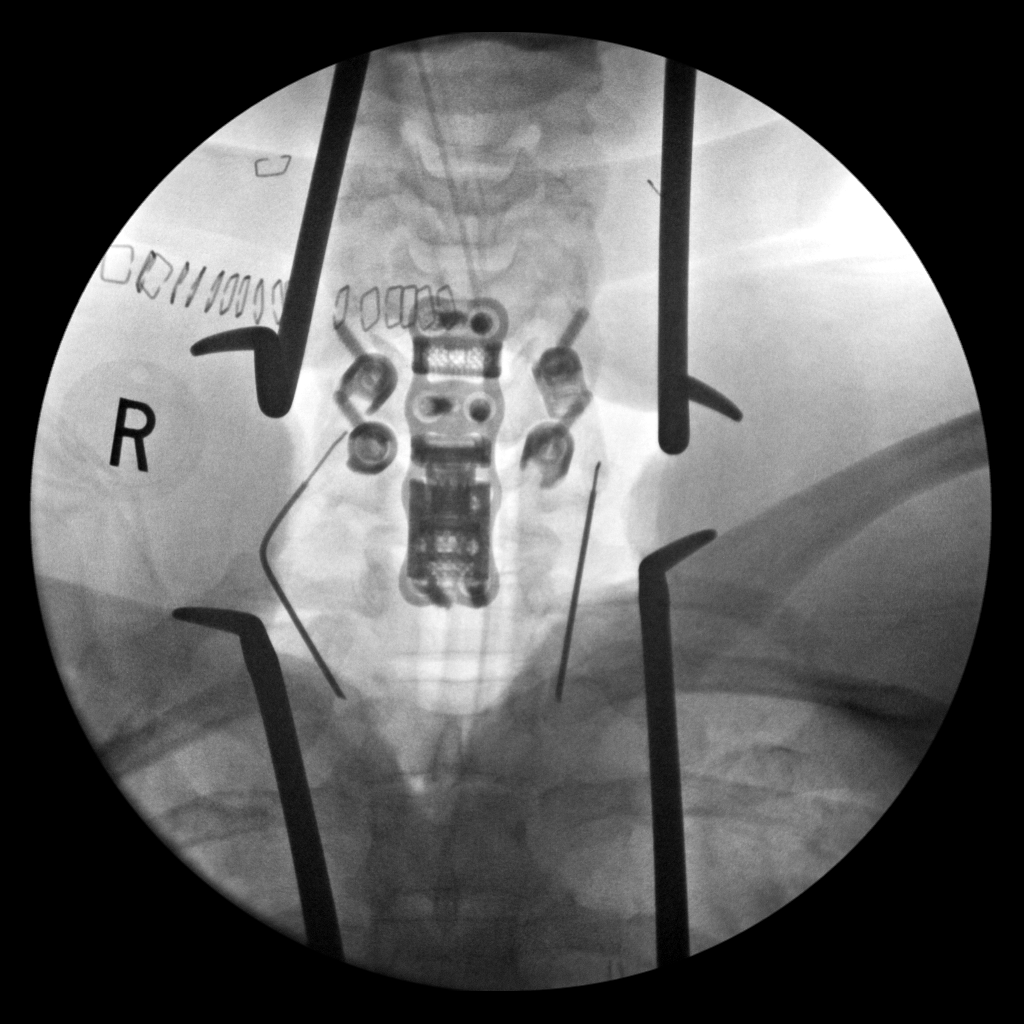
[im 3/3]
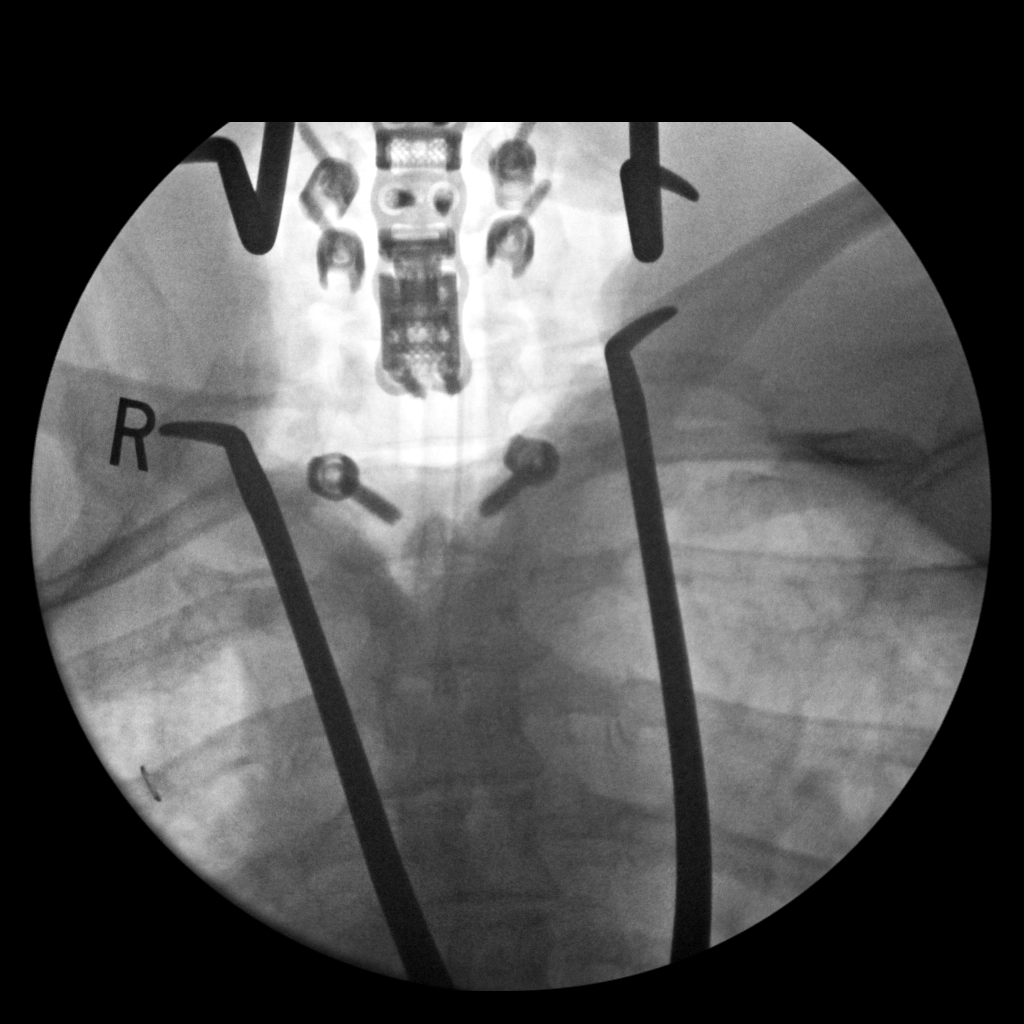

[3 of 3 positions shown; findings below may reference images not displayed]

FINDINGS: AP and lateral view intraoperative fluoroscopic images of the
cervical spine are submitted, 3 images total. On the provided
images, lateral mass screws are present within what appear to be the
C4, C5 and T1 vertebrae. Vertical interconnecting rods were not
present at the time the images were taken. Redemonstrated sequela of
prior C4-C7 ACDF and C6 corpectomy. Overlying surgical instruments
and retractors. Partially visualized ET tube.
IMPRESSION: Three intraoperative fluoroscopic images of the cervical spine, as
described.

## 2021-06-06 IMAGING — RF DG C-ARM 1-60 MIN
1 series · 3 of 3 positions shown · non-contrast
Comparison: Intraoperative fluoroscopic images of the cervical
spine [DATE]. CT of the cervical spine [DATE].

CLINICAL DATA: Surgery, elective [QF] ([QF]-CM). Additional
history provided: Cervical 4-5, cervical 5-6, cervical 6-7, cervical
7-thoracic 1 posterior cervical instrumentation and fusion with
cervical 4-cervical 7 laminectomy and decompression. Provided
fluoroscopy time 7 seconds (3.16 mGy).

EXAM:
CERVICAL SPINE - 2-3 VIEW; DG C-ARM 1-60 MIN

[Series 1: run · 3 of 3 slices shown]
[im 1/3]
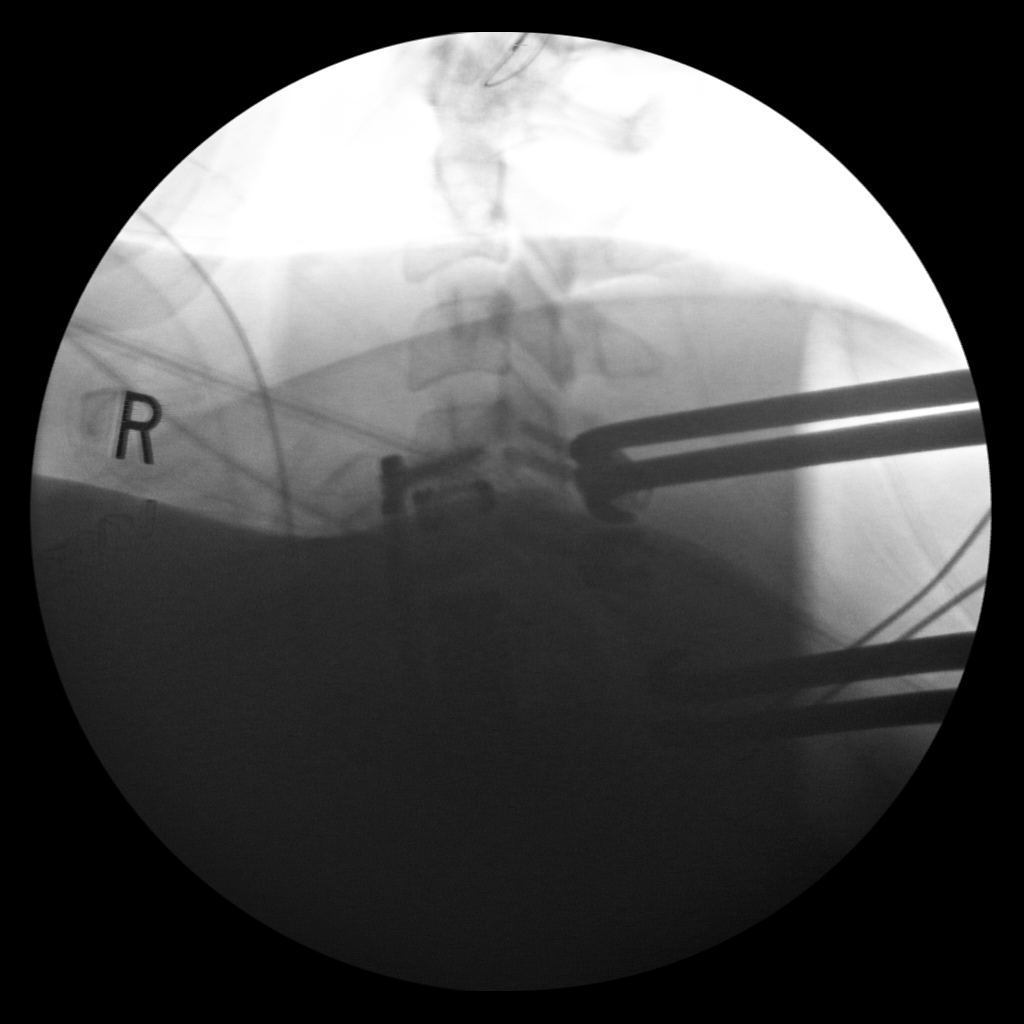
[im 2/3]
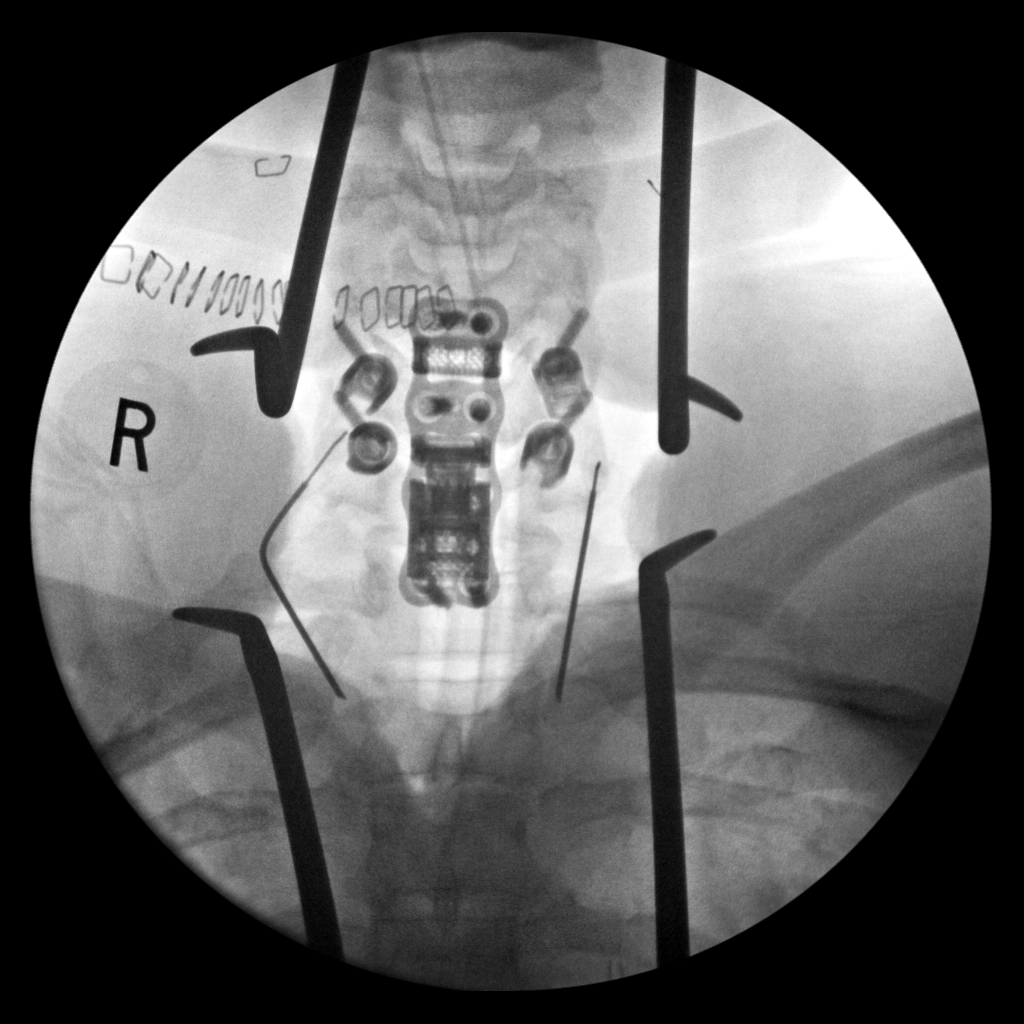
[im 3/3]
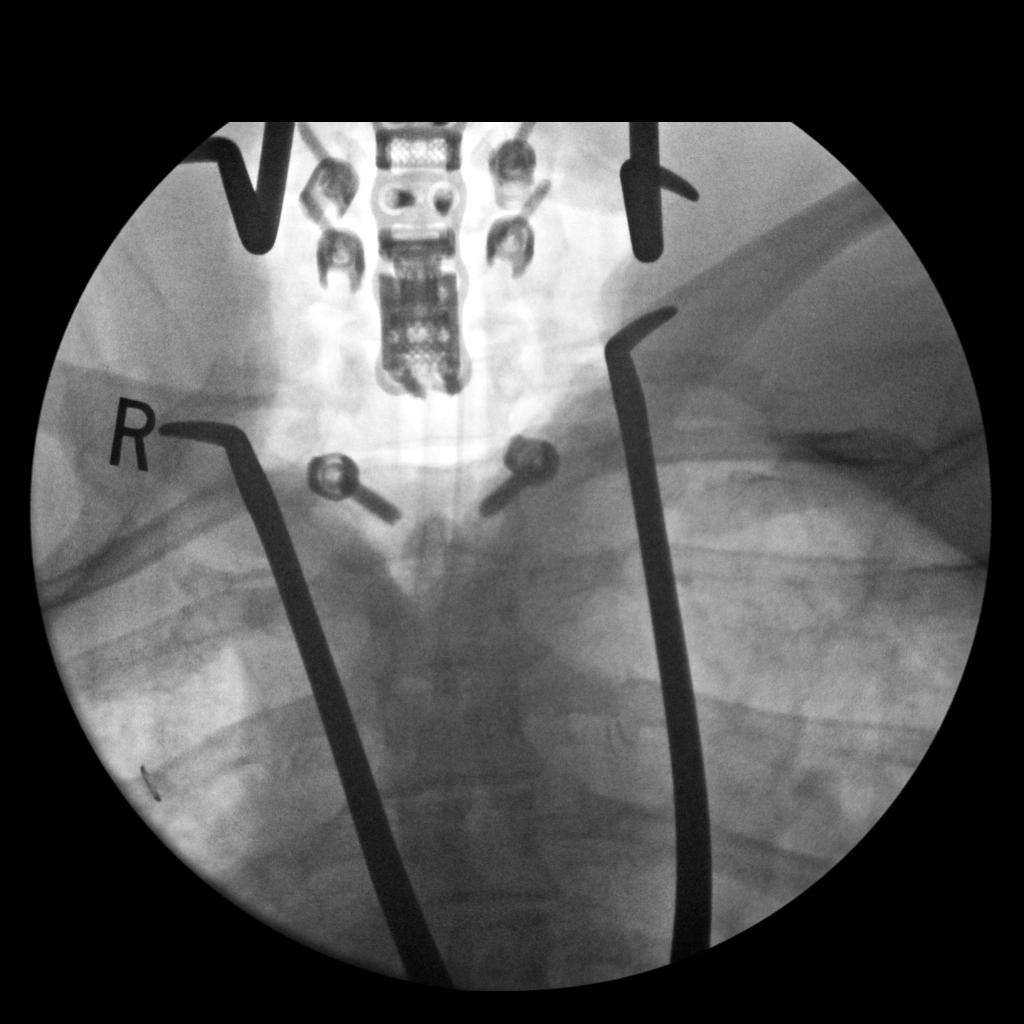

[3 of 3 positions shown; findings below may reference images not displayed]

FINDINGS: AP and lateral view intraoperative fluoroscopic images of the
cervical spine are submitted, 3 images total. On the provided
images, lateral mass screws are present within what appear to be the
C4, C5 and T1 vertebrae. Vertical interconnecting rods were not
present at the time the images were taken. Redemonstrated sequela of
prior C4-C7 ACDF and C6 corpectomy. Overlying surgical instruments
and retractors. Partially visualized ET tube.
IMPRESSION: Three intraoperative fluoroscopic images of the cervical spine, as
described.

## 2021-06-06 SURGERY — POSTERIOR CERVICAL FUSION/FORAMINOTOMY LEVEL 5
Anesthesia: General | Site: Spine Cervical

## 2021-06-06 MED ORDER — FENTANYL CITRATE (PF) 100 MCG/2ML IJ SOLN
25.0000 ug | INTRAMUSCULAR | Status: DC | PRN
  Administered 2021-06-06 (×2): 50 ug via INTRAVENOUS

## 2021-06-06 MED ORDER — KETAMINE HCL 50 MG/5ML IJ SOSY
PREFILLED_SYRINGE | INTRAMUSCULAR | Status: AC
  Filled 2021-06-06: qty 5

## 2021-06-06 MED ORDER — FENTANYL CITRATE (PF) 250 MCG/5ML IJ SOLN
INTRAMUSCULAR | Status: DC | PRN
  Administered 2021-06-06 (×5): 50 ug via INTRAVENOUS
  Administered 2021-06-06: 100 ug via INTRAVENOUS

## 2021-06-06 MED ORDER — ROCURONIUM BROMIDE 10 MG/ML (PF) SYRINGE
PREFILLED_SYRINGE | INTRAVENOUS | Status: AC
  Filled 2021-06-06: qty 10

## 2021-06-06 MED ORDER — ONDANSETRON HCL 4 MG/2ML IJ SOLN
4.0000 mg | Freq: Four times a day (QID) | INTRAMUSCULAR | Status: DC | PRN
Start: 2021-06-06 — End: 2021-06-06

## 2021-06-06 MED ORDER — BUPIVACAINE-EPINEPHRINE 0.5% -1:200000 IJ SOLN
INTRAMUSCULAR | Status: AC
  Filled 2021-06-06: qty 1

## 2021-06-06 MED ORDER — THROMBIN 20000 UNITS EX SOLR: CUTANEOUS | Status: DC | PRN

## 2021-06-06 MED ORDER — ROCURONIUM BROMIDE 10 MG/ML (PF) SYRINGE
PREFILLED_SYRINGE | INTRAVENOUS | Status: DC | PRN
  Administered 2021-06-06: 20 mg via INTRAVENOUS
  Administered 2021-06-06: 70 mg via INTRAVENOUS
  Administered 2021-06-06 (×2): 30 mg via INTRAVENOUS
  Administered 2021-06-06: 20 mg via INTRAVENOUS

## 2021-06-06 MED ORDER — MIDAZOLAM HCL 2 MG/2ML IJ SOLN
INTRAMUSCULAR | Status: AC
  Filled 2021-06-06: qty 2

## 2021-06-06 MED ORDER — DOCUSATE SODIUM 100 MG PO CAPS
100.0000 mg | ORAL_CAPSULE | Freq: Two times a day (BID) | ORAL | Status: DC
  Administered 2021-06-06 – 2021-06-09 (×6): 100 mg via ORAL
  Filled 2021-06-06 (×6): qty 1

## 2021-06-06 MED ORDER — 0.9 % SODIUM CHLORIDE (POUR BTL) OPTIME
TOPICAL | Status: DC | PRN
  Administered 2021-06-06: 1000 mL

## 2021-06-06 MED ORDER — LACTATED RINGERS IV SOLN: INTRAVENOUS | Status: DC | PRN

## 2021-06-06 MED ORDER — KETAMINE HCL 10 MG/ML IJ SOLN
INTRAMUSCULAR | Status: DC | PRN
  Administered 2021-06-06 (×2): 10 mg via INTRAVENOUS
  Administered 2021-06-06: 20 mg via INTRAVENOUS

## 2021-06-06 MED ORDER — METHOCARBAMOL 500 MG PO TABS
ORAL_TABLET | ORAL | Status: AC
  Administered 2021-06-06: 500 mg via ORAL
  Filled 2021-06-06: qty 1

## 2021-06-06 MED ORDER — DEXAMETHASONE SODIUM PHOSPHATE 10 MG/ML IJ SOLN
INTRAMUSCULAR | Status: DC | PRN
  Administered 2021-06-06: 10 mg via INTRAVENOUS

## 2021-06-06 MED ORDER — PHENYLEPHRINE 40 MCG/ML (10ML) SYRINGE FOR IV PUSH (FOR BLOOD PRESSURE SUPPORT)
PREFILLED_SYRINGE | INTRAVENOUS | Status: DC | PRN
  Administered 2021-06-06: 40 ug via INTRAVENOUS
  Administered 2021-06-06 (×3): 80 ug via INTRAVENOUS
  Administered 2021-06-06: 120 ug via INTRAVENOUS

## 2021-06-06 MED ORDER — PROPOFOL 10 MG/ML IV BOLUS
INTRAVENOUS | Status: AC
  Filled 2021-06-06: qty 20

## 2021-06-06 MED ORDER — FENTANYL CITRATE (PF) 250 MCG/5ML IJ SOLN
INTRAMUSCULAR | Status: AC
  Filled 2021-06-06: qty 5

## 2021-06-06 MED ORDER — OXYCODONE HCL 5 MG/5ML PO SOLN: 5.0000 mg | Freq: Once | ORAL | Status: DC | PRN

## 2021-06-06 MED ORDER — PROPOFOL 10 MG/ML IV BOLUS
INTRAVENOUS | Status: DC | PRN
  Administered 2021-06-06: 40 mg via INTRAVENOUS
  Administered 2021-06-06: 150 mg via INTRAVENOUS

## 2021-06-06 MED ORDER — MIDAZOLAM HCL 5 MG/5ML IJ SOLN
INTRAMUSCULAR | Status: DC | PRN
  Administered 2021-06-06: 2 mg via INTRAVENOUS

## 2021-06-06 MED ORDER — ACETAMINOPHEN 10 MG/ML IV SOLN
INTRAVENOUS | Status: DC | PRN
  Administered 2021-06-06: 1000 mg via INTRAVENOUS

## 2021-06-06 MED ORDER — CEFAZOLIN SODIUM 1 G IJ SOLR
INTRAMUSCULAR | Status: AC
  Filled 2021-06-06: qty 20

## 2021-06-06 MED ORDER — ONDANSETRON HCL 4 MG/2ML IJ SOLN
INTRAMUSCULAR | Status: DC | PRN
  Administered 2021-06-06: 4 mg via INTRAVENOUS

## 2021-06-06 MED ORDER — OXYCODONE HCL 5 MG PO TABS: 5.0000 mg | ORAL_TABLET | Freq: Once | ORAL | Status: DC | PRN

## 2021-06-06 MED ORDER — SODIUM CHLORIDE 0.9% FLUSH
3.0000 mL | Freq: Two times a day (BID) | INTRAVENOUS | Status: DC
  Administered 2021-06-07 – 2021-06-09 (×5): 3 mL via INTRAVENOUS

## 2021-06-06 MED ORDER — FENTANYL CITRATE (PF) 100 MCG/2ML IJ SOLN
INTRAMUSCULAR | Status: AC
  Filled 2021-06-06: qty 2

## 2021-06-06 MED ORDER — BUPIVACAINE-EPINEPHRINE (PF) 0.5% -1:200000 IJ SOLN
INTRAMUSCULAR | Status: DC | PRN
  Administered 2021-06-06: 10 mL
  Administered 2021-06-06: 5 mL

## 2021-06-06 MED ORDER — THROMBIN 5000 UNITS EX SOLR: OROMUCOSAL | Status: DC | PRN

## 2021-06-06 MED ORDER — ACETAMINOPHEN 10 MG/ML IV SOLN
INTRAVENOUS | Status: AC
  Filled 2021-06-06: qty 100

## 2021-06-06 MED ORDER — LABETALOL HCL 5 MG/ML IV SOLN
10.0000 mg | Freq: Once | INTRAVENOUS | Status: AC
  Administered 2021-06-06: 10 mg via INTRAVENOUS

## 2021-06-06 MED ORDER — LIDOCAINE 2% (20 MG/ML) 5 ML SYRINGE
INTRAMUSCULAR | Status: AC
  Filled 2021-06-06: qty 5

## 2021-06-06 MED ORDER — PHENYLEPHRINE HCL-NACL 10-0.9 MG/250ML-% IV SOLN
INTRAVENOUS | Status: DC | PRN
  Administered 2021-06-06: 50 ug/min via INTRAVENOUS

## 2021-06-06 MED ORDER — SODIUM CHLORIDE 0.9% FLUSH: 3.0000 mL | INTRAVENOUS | Status: DC | PRN

## 2021-06-06 MED ORDER — LIDOCAINE 2% (20 MG/ML) 5 ML SYRINGE
INTRAMUSCULAR | Status: DC | PRN
  Administered 2021-06-06: 60 mg via INTRAVENOUS

## 2021-06-06 MED ORDER — DEXAMETHASONE SODIUM PHOSPHATE 10 MG/ML IJ SOLN
INTRAMUSCULAR | Status: AC
  Filled 2021-06-06: qty 1

## 2021-06-06 MED ORDER — BUPIVACAINE LIPOSOME 1.3 % IJ SUSP
INTRAMUSCULAR | Status: AC
  Filled 2021-06-06: qty 20

## 2021-06-06 MED ORDER — LABETALOL HCL 5 MG/ML IV SOLN
INTRAVENOUS | Status: AC
  Filled 2021-06-06: qty 4

## 2021-06-06 MED ORDER — THROMBIN 5000 UNITS EX SOLR
CUTANEOUS | Status: AC
  Filled 2021-06-06: qty 5000

## 2021-06-06 MED ORDER — CHLORHEXIDINE GLUCONATE CLOTH 2 % EX PADS
6.0000 | MEDICATED_PAD | Freq: Every day | CUTANEOUS | Status: DC
  Administered 2021-06-09: 6 via TOPICAL

## 2021-06-06 MED ORDER — FENTANYL CITRATE (PF) 100 MCG/2ML IJ SOLN
INTRAMUSCULAR | Status: AC
  Administered 2021-06-06: 50 ug via INTRAVENOUS
  Filled 2021-06-06: qty 2

## 2021-06-06 MED ORDER — SUGAMMADEX SODIUM 500 MG/5ML IV SOLN
INTRAVENOUS | Status: AC
  Filled 2021-06-06: qty 5

## 2021-06-06 MED ORDER — VANCOMYCIN HCL 1 G IV SOLR
INTRAVENOUS | Status: DC | PRN
  Administered 2021-06-06: 1000 mg via TOPICAL

## 2021-06-06 MED ORDER — ONDANSETRON HCL 4 MG/2ML IJ SOLN
INTRAMUSCULAR | Status: AC
  Filled 2021-06-06: qty 4

## 2021-06-06 MED ORDER — HYDROMORPHONE HCL 1 MG/ML IJ SOLN
INTRAMUSCULAR | Status: AC
  Administered 2021-06-06: 0.5 mg via INTRAVENOUS
  Filled 2021-06-06: qty 1

## 2021-06-06 MED ORDER — ALBUTEROL SULFATE HFA 108 (90 BASE) MCG/ACT IN AERS
INHALATION_SPRAY | RESPIRATORY_TRACT | Status: DC | PRN
  Administered 2021-06-06 (×2): 2 via RESPIRATORY_TRACT

## 2021-06-06 MED ORDER — THROMBIN 20000 UNITS EX SOLR
CUTANEOUS | Status: AC
  Filled 2021-06-06: qty 20000

## 2021-06-06 MED ORDER — ONDANSETRON HCL 4 MG/2ML IJ SOLN
INTRAMUSCULAR | Status: AC
  Filled 2021-06-06: qty 2

## 2021-06-06 MED ORDER — SODIUM CHLORIDE 0.9 % IV SOLN: INTRAVENOUS | Status: DC

## 2021-06-06 MED ORDER — KETOROLAC TROMETHAMINE 15 MG/ML IJ SOLN
15.0000 mg | Freq: Four times a day (QID) | INTRAMUSCULAR | Status: AC
  Administered 2021-06-06 – 2021-06-07 (×4): 15 mg via INTRAVENOUS
  Filled 2021-06-06 (×4): qty 1

## 2021-06-06 MED ORDER — PROPOFOL 500 MG/50ML IV EMUL
INTRAVENOUS | Status: DC | PRN
  Administered 2021-06-06: 75 ug/kg/min via INTRAVENOUS

## 2021-06-06 MED ORDER — LIDOCAINE-EPINEPHRINE 1 %-1:100000 IJ SOLN
INTRAMUSCULAR | Status: AC
  Filled 2021-06-06: qty 1

## 2021-06-06 MED ORDER — VANCOMYCIN HCL 1000 MG IV SOLR
INTRAVENOUS | Status: AC
  Filled 2021-06-06: qty 1000

## 2021-06-06 MED ORDER — OXYCODONE HCL 5 MG PO TABS
ORAL_TABLET | ORAL | Status: AC
  Administered 2021-06-06: 10 mg via ORAL
  Filled 2021-06-06: qty 2

## 2021-06-06 MED ORDER — LIDOCAINE-EPINEPHRINE 1 %-1:100000 IJ SOLN
INTRAMUSCULAR | Status: DC | PRN
  Administered 2021-06-06: 5 mL
  Administered 2021-06-06: 10 mL

## 2021-06-06 MED ORDER — PROPOFOL 1000 MG/100ML IV EMUL
INTRAVENOUS | Status: AC
  Filled 2021-06-06: qty 100

## 2021-06-06 MED ORDER — HYDROMORPHONE HCL 1 MG/ML IJ SOLN
0.2500 mg | INTRAMUSCULAR | Status: DC | PRN
  Administered 2021-06-06: 0.5 mg via INTRAVENOUS

## 2021-06-06 MED ORDER — CEFAZOLIN SODIUM-DEXTROSE 2-4 GM/100ML-% IV SOLN
2.0000 g | Freq: Three times a day (TID) | INTRAVENOUS | Status: AC
  Administered 2021-06-06 – 2021-06-07 (×2): 2 g via INTRAVENOUS
  Filled 2021-06-06 (×3): qty 100

## 2021-06-06 MED ORDER — BUPIVACAINE LIPOSOME 1.3 % IJ SUSP
INTRAMUSCULAR | Status: DC | PRN
  Administered 2021-06-06: 20 mL

## 2021-06-06 MED ORDER — SUGAMMADEX SODIUM 200 MG/2ML IV SOLN
INTRAVENOUS | Status: DC | PRN
  Administered 2021-06-06: 200 mg via INTRAVENOUS
  Administered 2021-06-06: 100 mg via INTRAVENOUS

## 2021-06-06 MED ORDER — SODIUM CHLORIDE 0.9 % IV SOLN: 250.0000 mL | INTRAVENOUS | Status: DC

## 2021-06-06 SURGICAL SUPPLY — 78 items
ADH SKN CLS APL DERMABOND .7 (GAUZE/BANDAGES/DRESSINGS) ×1
BAG COUNTER SPONGE SURGICOUNT (BAG) ×2 IMPLANT
BAG SPNG CNTER NS LX DISP (BAG) ×1
BAND INSRT 18 STRL LF DISP RB (MISCELLANEOUS) ×2
BAND RUBBER #18 3X1/16 STRL (MISCELLANEOUS) ×4 IMPLANT
BIT DRILL NEURO 2X3.1 SFT TUCH (MISCELLANEOUS) ×1 IMPLANT
BLADE BN FN 3.2XSTRL LF (MISCELLANEOUS) IMPLANT
BLADE BONE MILL FINE (MISCELLANEOUS) ×4
BUR MATCHSTICK NEURO 3.0 LAGG (BURR) ×1 IMPLANT
CNTNR URN SCR LID CUP LEK RST (MISCELLANEOUS) ×1 IMPLANT
CONT SPEC 4OZ STRL OR WHT (MISCELLANEOUS) ×4
COVER BACK TABLE 60X90IN (DRAPES) ×2 IMPLANT
COVER MAYO STAND STRL (DRAPES) ×3 IMPLANT
DERMABOND ADVANCED (GAUZE/BANDAGES/DRESSINGS) ×1
DERMABOND ADVANCED .7 DNX12 (GAUZE/BANDAGES/DRESSINGS) ×1 IMPLANT
DRAPE C-ARM 42X72 X-RAY (DRAPES) ×2 IMPLANT
DRAPE LAPAROTOMY 100X72 PEDS (DRAPES) ×2 IMPLANT
DRAPE MICROSCOPE LEICA (MISCELLANEOUS) ×2 IMPLANT
DRILL NEURO 2X3.1 SOFT TOUCH (MISCELLANEOUS) ×2
DRSG OPSITE 4X5.5 SM (GAUZE/BANDAGES/DRESSINGS) ×1 IMPLANT
DRSG OPSITE POSTOP 4X6 (GAUZE/BANDAGES/DRESSINGS) ×1 IMPLANT
DRSG OPSITE POSTOP 4X8 (GAUZE/BANDAGES/DRESSINGS) ×1 IMPLANT
DURAPREP 26ML APPLICATOR (WOUND CARE) ×2 IMPLANT
ELECT BLADE INSULATED 4IN (ELECTROSURGICAL) ×2
ELECT BLADE INSULATED 6.5IN (ELECTROSURGICAL)
ELECT REM PT RETURN 9FT ADLT (ELECTROSURGICAL) ×2
ELECTRODE BLADE INSULATED 4IN (ELECTROSURGICAL) IMPLANT
ELECTRODE BLDE INSULATED 6.5IN (ELECTROSURGICAL) IMPLANT
ELECTRODE REM PT RTRN 9FT ADLT (ELECTROSURGICAL) ×1 IMPLANT
FEE INTRAOP CADWELL SUPPLY NCS (MISCELLANEOUS) IMPLANT
FEE INTRAOP MONITOR IMPULS NCS (MISCELLANEOUS) IMPLANT
GAUZE 4X4 16PLY ~~LOC~~+RFID DBL (SPONGE) ×1 IMPLANT
GLOVE SRG 8 PF TXTR STRL LF DI (GLOVE) ×1 IMPLANT
GLOVE SURG ENC MOIS LTX SZ6.5 (GLOVE) ×2 IMPLANT
GLOVE SURG ENC MOIS LTX SZ8 (GLOVE) ×2 IMPLANT
GLOVE SURG ENC MOIS LTX SZ8.5 (GLOVE) ×2 IMPLANT
GLOVE SURG ENC TEXT LTX SZ7 (GLOVE) ×3 IMPLANT
GLOVE SURG ENC TEXT LTX SZ7.5 (GLOVE) ×3 IMPLANT
GLOVE SURG LTX SZ8 (GLOVE) ×3 IMPLANT
GLOVE SURG UNDER POLY LF SZ8 (GLOVE) ×4
GOWN STRL REUS W/ TWL LRG LVL3 (GOWN DISPOSABLE) IMPLANT
GOWN STRL REUS W/ TWL XL LVL3 (GOWN DISPOSABLE) ×1 IMPLANT
GOWN STRL REUS W/TWL 2XL LVL3 (GOWN DISPOSABLE) ×2 IMPLANT
GOWN STRL REUS W/TWL LRG LVL3 (GOWN DISPOSABLE) ×2
GOWN STRL REUS W/TWL XL LVL3 (GOWN DISPOSABLE) ×2
HEMOSTAT POWDER KIT SURGIFOAM (HEMOSTASIS) ×2 IMPLANT
INTRAOP CADWELL SUPPLY FEE NCS (MISCELLANEOUS) ×1
INTRAOP DISP SUPPLY FEE NCS (MISCELLANEOUS) ×2
INTRAOP MONITOR FEE IMPULS NCS (MISCELLANEOUS) ×1
INTRAOP MONITOR FEE IMPULSE (MISCELLANEOUS) ×2
KIT BASIN OR (CUSTOM PROCEDURE TRAY) ×2 IMPLANT
KIT TURNOVER KIT B (KITS) ×2 IMPLANT
MARKER SKIN DUAL TIP RULER LAB (MISCELLANEOUS) ×3 IMPLANT
NDL HYPO 25X1 1.5 SAFETY (NEEDLE) ×1 IMPLANT
NDL SPNL 18GX3.5 QUINCKE PK (NEEDLE) ×2 IMPLANT
NEEDLE HYPO 25X1 1.5 SAFETY (NEEDLE) ×2 IMPLANT
NEEDLE SPNL 18GX3.5 QUINCKE PK (NEEDLE) ×4 IMPLANT
NS IRRIG 1000ML POUR BTL (IV SOLUTION) ×2 IMPLANT
PACK LAMINECTOMY NEURO (CUSTOM PROCEDURE TRAY) ×2 IMPLANT
PUTTY BONE 100 VESUVIUS 1CC (Putty) ×1 IMPLANT
PUTTY BONE 100 VESUVIUS 2.5CC (Putty) ×1 IMPLANT
ROD CONTOURED TI YUKON 3.5X75 (Rod) ×2 IMPLANT
SCREW PA YUKON 3.5X12 (Screw) ×1 IMPLANT
SCREW PA YUKON 4.5X26 (Screw) ×2 IMPLANT
SCREW SET SPINAL YUKON (Set) ×6 IMPLANT
SCREW SPIN YUKON POLY 03.5X14 (Screw) ×3 IMPLANT
SPONGE GAUZE 2X2 8PLY STRL LF (GAUZE/BANDAGES/DRESSINGS) ×1 IMPLANT
SPONGE SURGIFOAM ABS GEL 100 (HEMOSTASIS) ×2 IMPLANT
SPONGE T-LAP 4X18 ~~LOC~~+RFID (SPONGE) ×1 IMPLANT
STAPLER VISISTAT 35W (STAPLE) ×1 IMPLANT
SUT VIC AB 0 CT1 18XCR BRD8 (SUTURE) IMPLANT
SUT VIC AB 0 CT1 8-18 (SUTURE) ×4
SUT VIC AB 2-0 CP2 18 (SUTURE) ×3 IMPLANT
SUT VIC AB 3-0 SH 8-18 (SUTURE) ×2 IMPLANT
TOWEL GREEN STERILE (TOWEL DISPOSABLE) ×2 IMPLANT
TOWEL GREEN STERILE FF (TOWEL DISPOSABLE) ×2 IMPLANT
TRAY FOLEY MTR SLVR 16FR STAT (SET/KITS/TRAYS/PACK) ×2 IMPLANT
WATER STERILE IRR 1000ML POUR (IV SOLUTION) ×2 IMPLANT

## 2021-06-06 NOTE — Anesthesia Procedure Notes (Signed)
Procedure Name: Intubation Date/Time: 06/06/2021 7:55 AM Performed by: Audie Pinto, CRNA Pre-anesthesia Checklist: Patient identified, Emergency Drugs available, Suction available and Patient being monitored Patient Re-evaluated:Patient Re-evaluated prior to induction Oxygen Delivery Method: Circle system utilized Preoxygenation: Pre-oxygenation with 100% oxygen Induction Type: IV induction Ventilation: Mask ventilation without difficulty Laryngoscope Size: Glidescope and 3 Grade View: Grade I Tube type: Oral Tube size: 7.0 mm Number of attempts: 1 Airway Equipment and Method: Stylet and Oral airway Placement Confirmation: ETT inserted through vocal cords under direct vision, positive ETCO2 and breath sounds checked- equal and bilateral Secured at: 21 cm Tube secured with: Tape Dental Injury: Teeth and Oropharynx as per pre-operative assessment  Comments: Elective glidescope

## 2021-06-06 NOTE — Progress Notes (Signed)
   Providing Compassionate, Quality Care - Together  NEUROSURGERY PROGRESS NOTE   S: No issues overnight.   O: EXAM:  BP (!) 165/86 (BP Location: Left Arm)   Pulse 84   Temp 98.1 F (36.7 C) (Oral)   Resp 20   Ht 5\' 5"  (1.651 m)   Wt 114.8 kg   SpO2 98%   BMI 42.10 kg/m   Awake, alert, oriented x3 Neck soft Incision c/d/i Speech fluent, appropriate  CNs grossly intact  4+/5 BUE 5/5 BLE   ASSESSMENT:  36 y.o. female with   C4-7 stenosis with myelopathy  S/p ACDF C4-7 w C6 corpectomy  PLAN: - OR today for stage II posterior cervical instrumentation and fusion C4-T1, lami C6-7 -d/w patient, she agrees to proceed     Thank you for allowing me to participate in this patient's care.  Please do not hesitate to call with questions or concerns.   20, DO Neurosurgeon [facility] Cell: [phone]

## 2021-06-06 NOTE — Progress Notes (Signed)
   Providing Compassionate, Quality Care - Together  NEUROSURGERY PROGRESS NOTE   S: pt s/e in pacu, doing well  O: EXAM:  BP (!) 180/86   Pulse 81   Temp 97.7 F (36.5 C)   Resp 14   Ht 5\' 5"  (1.651 m)   Wt 114.8 kg   SpO2 98%   BMI 42.10 kg/m   Awake, alert, oriented x3 Speech fluent, appropriate  PERRL Neck soft Trachea midline CNs grossly intact  MAE equally  ASSESSMENT:  35 y.o. 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  Please do not hesitate to call with questions or concerns.   06/08/2021, DO Neurosurgeon [facility] Cell: [phone]

## 2021-06-06 NOTE — Op Note (Signed)
Providing Compassionate, Quality Care - Together Date of service: 06/06/2021  PREOP DIAGNOSIS:  Cervical stenosis, C4-7 with myelopathy  POSTOP DIAGNOSIS: Same  PROCEDURE: Posterior cervical arthrodesis C4-5, C5-6, C6-7, C7-T1 with lateral mass instrumentation bilaterally C4, C5 and bilateral pedicle screws at T1 (K2M) Bilateral C6, C7 laminectomy for decompression of neural elements Use of autograft, same incision Use of allograft, vesuvius DBM Intraoperative use of microscope for microdissection Intraoperative use of fluoroscopy less than 1 hour Intraoperative use of neuro monitoring, SSEPs, greater than 1 hour  SURGEON: Dr. Kendell Bane C. Shanae Luo, DO  ASSISTANT: Coletta Memos, MD  SECOND ASSISTANT: Docia Barrier, NP  ANESTHESIA: General Endotracheal  EBL: 100 cc  SPECIMENS: None  DRAINS: Medium Hemovac  COMPLICATIONS: None  CONDITION: Hemodynamically stable  HISTORY: Katrina Lambert is a 35 y.o. female that presented to my office with complaints of neck pain, numbness and tingling in her upper extremities, difficulty walking and multiple falls.  On physical exam she had evidence of upper motor neuron disease therefore an MRI was obtained.  MRI revealed moderate to severe stenosis at C4-5, severe stenosis at C5-6 and C6-7 with moderate to severe stenosis posteriorly behind the C6 vertebral body.  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  I discussed with the patient the options of continued monitoring, anterior versus posterior decompression and fusion.  Given her significant bulky disease of the C6 vertebral body and sizable disc herniations at each level, I recommended an anterior cervical discectomy and fusion C4-7 with C6 corpectomy.  She tolerated the surgery well.  I had previously discussed with the patient that likely she would need posterior instrumentation and decompression pending how her anterior surgery was completed.  I recommended a  posterior cervical instrumentation and fusion C4-T1 to supplement her anterior hardware given her risks of nonunion as a smoker and obesity as well as to appropriately decompress at C6-7 where she had significant anterior calcification and osteophyte that could not be adequately decompressed from an anterior approach.  All risks, benefits and outcomes were discussed and agreed upon.  PROCEDURE IN DETAIL: The patient was brought to the operating room. After induction of general anesthesia, prepositional SSEPs were obtained and noted to be stable and monitorable.  The patient's head was placed in the [facility] head holder.  The patient was positioned on the operative table in the prone position. All pressure points were meticulously padded. Skin incision was then marked out and prepped and draped in the usual sterile fashion.  Using a 10 blade, incision was made in midline on the posterior cervical spinous processes.  Soft tissue dissection was performed down to the cervical fascia.  Using Bovie electrocautery, subperiosteal dissection was performed bilaterally to expose the C4, C5, C6, C7 lamina and lateral masses and T1 lamina and transverse processes.  Deep retractors were placed in the wound.  Lateral fluoroscopy confirmed the appropriate level.  The microscope was sterilely draped and brought into the field at this point.  Using a high-speed drill bilateral troughs were created at the lamina facet junction at C6 and C7.  The ligamentum flavum was gently dissected freely from the dura and Kerrison rongeur was used to resect ligamentous attachment.  The lamina was then gently elevated with curettes and removed.  The dura was visualized and noted to be decompressed and pulsatile.  Epidural hemostasis was achieved with Surgifoam.  Pilot holes were created in the C4 lateral masses bilaterally, C5 lateral masses, and then tapped to a  length of approximately 12 mm.  Each lateral mass screw was felt with a feeler and  noted to have all bony borders.  A 3.5 mm x 12 mm lateral mass screw was selected for the left C4 lateral mass.  This was placed without complication and had appropriate bony purchase.  A 3.5 x 14 mm lateral mass screw was selected for the right lateral mass at C4 and bilateral lateral masses at C5.  They had appropriate bony purchase.  Lateral fluoroscopy confirmed appropriate placement.  Using a high-speed drill, the joint spaces were decorticated at C4-5 bilaterally, C5-6 bilaterally, C6-7 bilaterally, C7-T1 bilaterally.  Using a high-speed drill a pilot hole was created at the junction of the TP and C7-T1 facet.  Using a pedicle finder and tapped, the pedicle was accessed.  AP fluoroscopy was used and confirmed access to the bilateral T1 pedicles.  The pedicles were then tapped with a 3.5 mm tap bilaterally to a depth of 26 mm.  Using a feeler, the trajectories were felt to have all bony borders.  4.5 x 26 mm screws were selected and placed bilaterally at T1.  AP imaging confirmed appropriate placement.  There was good bony purchase bilaterally.  Attention was then turned to placement of precontoured rods bilaterally.  These were placed appropriately and secured with set screws.  All screws were final tightened to the manufacturer's recommendation.  The remaining lamina over C4 and C5 was decorticated with a high-speed drill.  The removed lamina from C6 and C7 was used for autograft after being processed in the bone mill.  This was mixed with allograft and placed along the lateral gutters bilaterally.  Deep retractors were taken out of the wound.  Hemostasis was achieved with bipolar cautery.  The wound was irrigated copiously and noted to be excellently hemostatic.  A medium Hemovac was tunneled inferiorly and placed in the subfascial space.  The muscle and fascia was then closed with 0 Vicryl sutures.  The dermis was then closed with 2-0 and 3-0 Vicryl sutures.  The skin was closed with glue.  Sterile  dressing was applied.  At the end of the case all sponge, needle, and instrument counts were correct. The patient was then transferred to the stretcher, the Sugita head holder was removed and the pin sites were stable, extubated, and taken to the post-anesthesia care unit in stable hemodynamic condition.  SSEPs were noted to be stable the entirety of the case.

## 2021-06-06 NOTE — Anesthesia Preprocedure Evaluation (Signed)
Anesthesia Evaluation  Patient identified by MRN, date of birth, ID band Patient awake    Reviewed: Allergy & Precautions, H&P , NPO status , Patient's Chart, lab work & pertinent test results  History of Anesthesia Complications (+) PONV and history of anesthetic complications  Airway Mallampati: II   Neck ROM: full    Dental   Pulmonary shortness of breath, asthma , Current Smoker,    breath sounds clear to auscultation       Cardiovascular hypertension,  Rhythm:regular Rate:Normal     Neuro/Psych  Headaches, PSYCHIATRIC DISORDERS Anxiety Depression Bipolar Disorder  Neuromuscular disease    GI/Hepatic GERD  ,  Endo/Other  Morbid obesity  Renal/GU      Musculoskeletal  (+) Arthritis ,   Abdominal   Peds  Hematology   Anesthesia Other Findings   Reproductive/Obstetrics                             Anesthesia Physical Anesthesia Plan  ASA: 2  Anesthesia Plan: General   Post-op Pain Management:    Induction: Intravenous  PONV Risk Score and Plan: 3 and Ondansetron, Dexamethasone, Midazolam and Treatment may vary due to age or medical condition  Airway Management Planned: Oral ETT  Additional Equipment:   Intra-op Plan:   Post-operative Plan: Extubation in OR  Informed Consent: I have reviewed the patients History and Physical, chart, labs and discussed the procedure including the risks, benefits and alternatives for the proposed anesthesia with the patient or authorized representative who has indicated his/her understanding and acceptance.     Dental advisory given  Plan Discussed with: CRNA, Anesthesiologist and Surgeon  Anesthesia Plan Comments:         Anesthesia Quick Evaluation

## 2021-06-06 NOTE — Transfer of Care (Signed)
Immediate Anesthesia Transfer of Care Note  Patient: Katrina Lambert  Procedure(s) Performed: CERVICAL FOUR-FIVE, CERVICAL FIVE-SIX, CERVICAL SIX-SEVEN, CERVICAL SEVEN-THORACIC ONE POSTERIOR CERVICAL INSTRUMENTATION AND FUSION WITH CERVICAL FOUR-CERVICAL SEVEN LAMINECTOMY/DECOMPRESSION (Spine Cervical)  Patient Location: PACU  Anesthesia Type:General  Level of Consciousness: awake, alert  and patient cooperative  Airway & Oxygen Therapy: Patient Spontanous Breathing and Patient connected to face mask oxygen  Post-op Assessment: Report given to RN and Post -op Vital signs reviewed and stable  Post vital signs: Reviewed  Last Vitals:  Vitals Value Taken Time  BP 168/57 06/06/21 1147  Temp    Pulse 98 06/06/21 1153  Resp 15 06/06/21 1153  SpO2 99 % 06/06/21 1153  Vitals shown include unvalidated device data.  Last Pain:  Vitals:   06/06/21 0525  TempSrc:   PainSc: 2       Patients Stated Pain Goal: 2 (06/06/21 0440)  Complications: No notable events documented.

## 2021-06-07 ENCOUNTER — Inpatient Hospital Stay (HOSPITAL_COMMUNITY): Payer: Medicaid Other

## 2021-06-07 IMAGING — CR DG CERVICAL SPINE 2 OR 3 VIEWS
2 series · 2 of 2 positions shown · non-contrast
Comparison: [DATE]

CLINICAL DATA: Status post ACDF and posterior fixation of the
cervical spine.

EXAM:
CERVICAL SPINE - 2-3 VIEW

[c-spine lat]
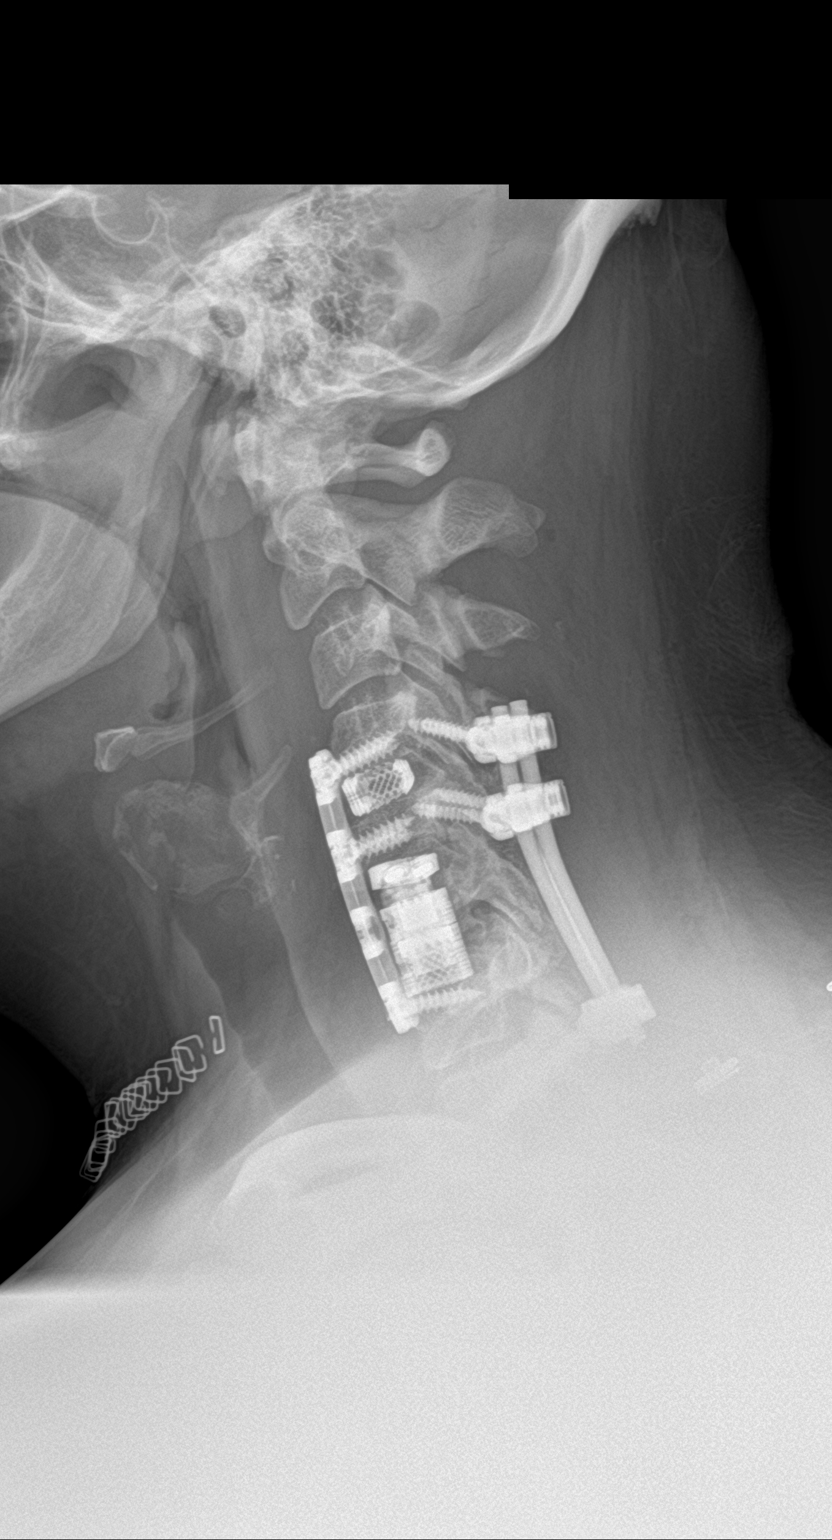

[c-spine ap]
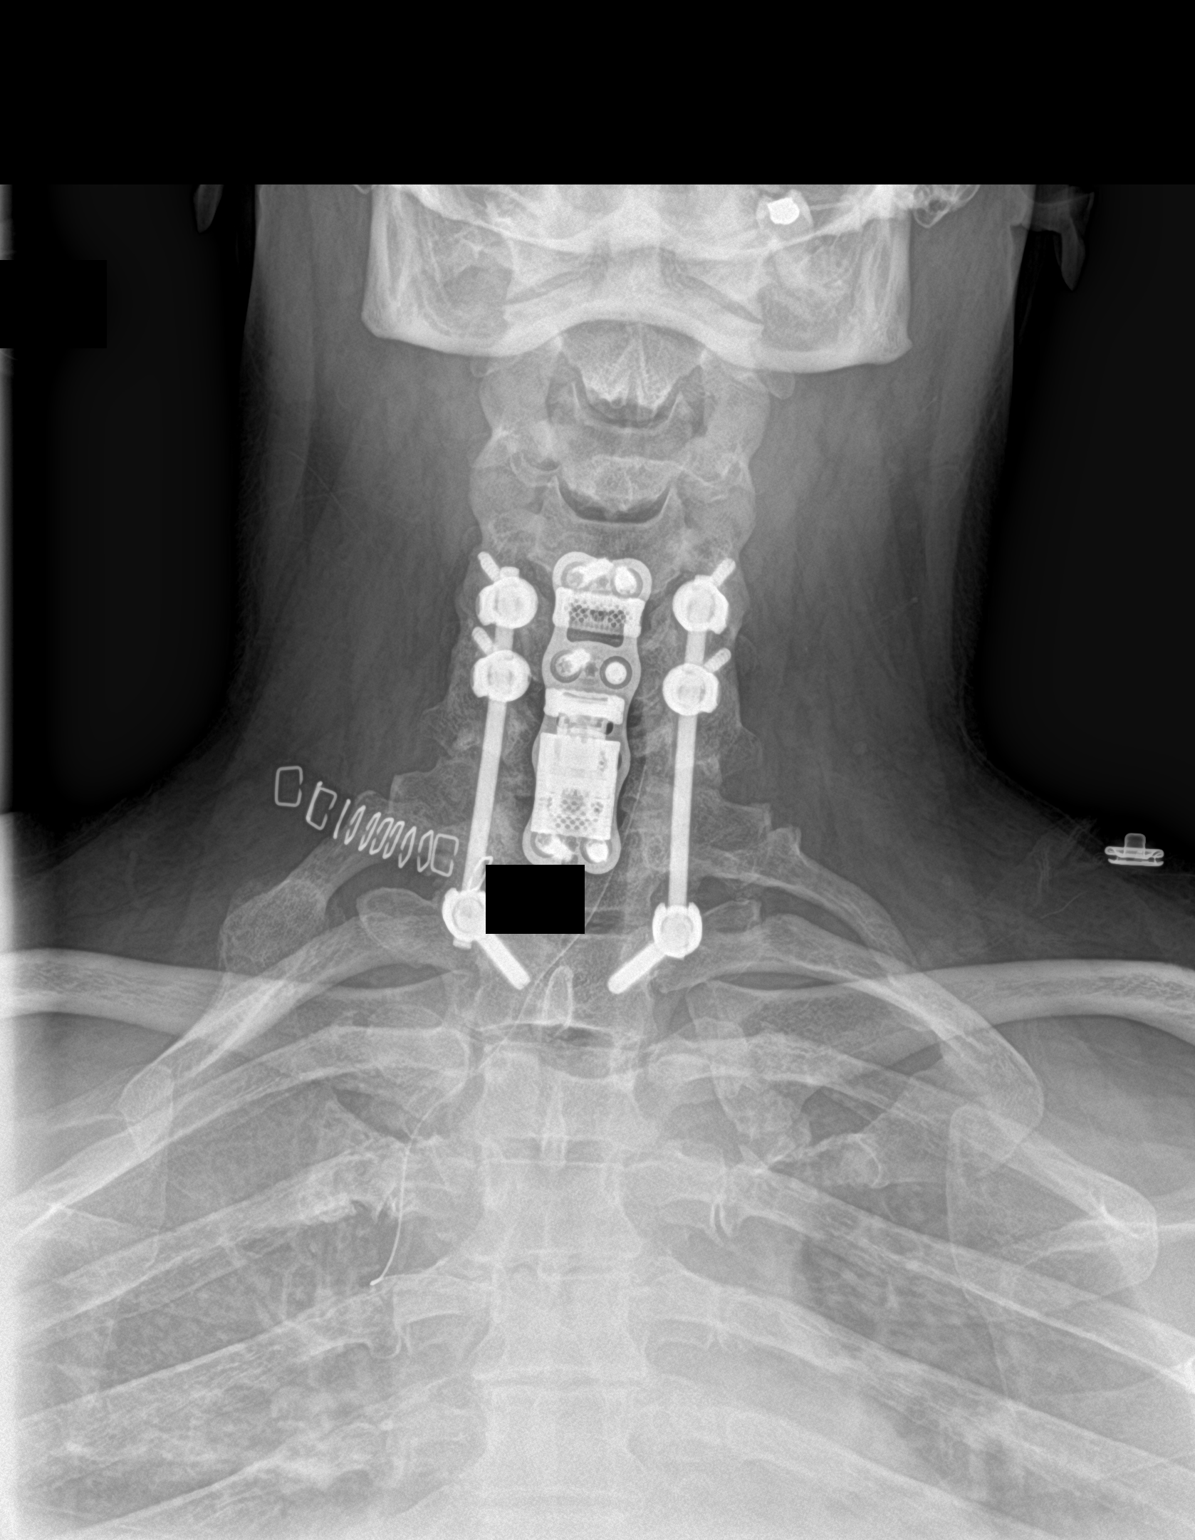

[2 of 2 positions shown; findings below may reference images not displayed]

FINDINGS: The cervical spine is visualized from C1-C7. Revisualization of
cervical ribs of C7. Status post ACDF of C4-7 with posterior fusion
spanning C4-T1. Intervertebral body spacer placement at C4-5 and
cage placement at C6 status post corpectomy. Hardware is intact
without perihardware fracture or lucency. Surgical drain. Overlying
surgical staples. Mild reversal of the cervical lordosis, unchanged.
No new spondylolisthesis. Intervertebral spaces of the superior
cervical spine relatively maintained. Prevertebral soft tissue
swelling consistent with recent surgical intervention.
IMPRESSION: Expected postsurgical changes status post ACDF, posterior fixation
and C6 corpectomy.

## 2021-06-07 MED ORDER — POLYETHYLENE GLYCOL 3350 17 G PO PACK
17.0000 g | PACK | Freq: Every day | ORAL | Status: DC | PRN
  Administered 2021-06-07 – 2021-06-08 (×2): 17 g via ORAL
  Filled 2021-06-07 (×2): qty 1

## 2021-06-07 NOTE — Evaluation (Addendum)
Physical Therapy Re-Evaluation Patient Details Name: ALAJAH WITMAN MRN: [id number] DOB: [birth year] Today's Date: 06/07/2021   History of Present Illness  Pt is a 35 y/o female who presents s/p C3-7 ACDF. Now s/p C4-T1 posterior arthrodesis and C6-7 laminectomy. PMH significant for anxiety, depression, bipolar disorder, and neuromuscular disorder.   Clinical Impression  Pt in bed upon arrival of PT, agreeable to evaluation at this time. Prior to admission the pt was completely independent, living in a home with 3-4 steps to enter. The pt now presents with minor limitations in functional mobility, LLE strength and endurance, and activity tolerance due to above dx and resulting pain. The pt was able to demo good independence with sit-stand transfers, hallway ambulation, and stair navigation. The pt was educated in a series of exercises to facilitate LLE mobility and strength, and was given HEP printout for continued exercises. The pt verbalized understanding of all recommendations, has no further acute PT needs. Thank you for the consult.      Follow Up Recommendations Supervision - Intermittent;No PT follow up    Equipment Recommendations  Rolling walker with 5" wheels    Recommendations for Other Services       Precautions / Restrictions Precautions Precautions: Cervical;Fall Precaution Booklet Issued: Yes (comment) Precaution Comments: handout provided for fine motor cues for adls with cervical precautions Cervical Brace:  (no c-collar needed per neurosurgery note) Restrictions Weight Bearing Restrictions: No      Mobility  Bed Mobility Overal bed mobility: Modified Independent             General bed mobility comments: OOB elevated and exiting with bed rail. pt reports family trying to get recliner for home at this time    Transfers Overall transfer level: Modified independent Equipment used: Rolling walker (2 wheeled)             General transfer comment: no assist  requried, pt able to complete safely without cues with use of RW  Ambulation/Gait Ambulation/Gait assistance: Supervision Gait Distance (Feet): 300 Feet Assistive device: Rolling walker (2 wheeled) Gait Pattern/deviations: Step-through pattern;Decreased stride length;Trunk flexed Gait velocity: 0.33 m/s Gait velocity interpretation: <1.31 ft/sec, indicative of household ambulator General Gait Details: VC for posutre, pt with no LOB or need for physical assist  Stairs Stairs: Yes Stairs assistance: Min assist Stair Management: One rail Right;Step to pattern;Forwards Number of Stairs: 1 (x5) General stair comments: pt able to safely manage with single UE on rail and RLE leading, reporting increased concern for LLE buckle with LLE leading, but able to complete with BUE support and minG at knee and hip. completed power up with LLE leading x5     Balance Overall balance assessment: No apparent balance deficits (not formally assessed)                                           Pertinent Vitals/Pain Pain Assessment: Faces Pain Score: 9  Faces Pain Scale: Hurts even more Pain Location: neck Pain Descriptors / Indicators: Operative site guarding Pain Intervention(s): Monitored during session;Premedicated before session;Repositioned    Home Living Family/patient expects to be discharged to:: Private residence Living Arrangements: Children Available Help at Discharge: Family Type of Home: Mobile home Home Access: Stairs to enter Entrance Stairs-Rails: Right;Left;Can reach both Entrance Stairs-Number of Steps: 4 Home Layout: One level Home Equipment: Walker - 2 wheels Additional Comments: has 30 yo daughter  at home that can (A) ,    Prior Function Level of Independence: Independent with assistive device(s)         Comments: reports that she uses the wheelchair at the grocery store after she fell trying to get around the store, reports using RW as needed due to  LLE giving out at times        Extremity/Trunk Assessment   Upper Extremity Assessment Upper Extremity Assessment: Overall WFL for tasks assessed (reports she has dropped her phone a few times but also being very sleepy when it happened after pain medications)    Lower Extremity Assessment Lower Extremity Assessment: Defer to PT evaluation LLE Deficits / Details: limited endurance, pt with no instances of buckling during session but pt reporting increaed weakness with continued activity    Cervical / Trunk Assessment Cervical / Trunk Assessment: Other exceptions Cervical / Trunk Exceptions: s/p cervical surgery second surgery 7/22  Communication   Communication: No difficulties  Cognition Arousal/Alertness: Awake/alert Behavior During Therapy: [facility] for tasks assessed/performed Overall Cognitive Status: Within Functional Limits for tasks assessed                                 General Comments: pt with good recall of cervical precautions, able to apply to mobility      General Comments General comments (skin integrity, edema, etc.): VSS through session    Exercises General Exercises - Lower Extremity Ankle Circles/Pumps: AROM;Both;15 reps;Seated Long Arc Quad: AROM;Left;10 reps;Seated Hip Flexion/Marching: AROM;Both;10 reps (walking) Other Exercises Other Exercises: step-up to 8 inch stair with BUE support x5 each leg   Assessment/Plan    PT Assessment Patent does not need any further PT services  PT Problem List         PT Treatment Interventions      PT Goals (Current goals can be found in the Care Plan section)  Acute Rehab PT Goals Patient Stated Goal: go home PT Goal Formulation: All assessment and education complete, DC therapy Time For Goal Achievement: 06/07/21 Potential to Achieve Goals: Good     AM-PAC PT "6 Clicks" Mobility  Outcome Measure Help needed turning from your back to your side while in a flat bed without using bedrails?:  None Help needed moving from lying on your back to sitting on the side of a flat bed without using bedrails?: None Help needed moving to and from a bed to a chair (including a wheelchair)?: A Little Help needed standing up from a chair using your arms (e.g., wheelchair or bedside chair)?: A Little Help needed to walk in hospital room?: A Little Help needed climbing 3-5 steps with a railing? : A Little 6 Click Score: 20    End of Session Equipment Utilized During Treatment: Gait belt Activity Tolerance: Patient tolerated treatment well Patient left: with call bell/phone within reach;in chair Nurse Communication: Mobility status PT Visit Diagnosis: Unsteadiness on feet (R26.81);Pain Pain - Right/Left: Left Pain - part of body: Leg    Time: 2536-6440 PT Time Calculation (min) (ACUTE ONLY): 20 min   Charges:   PT Evaluation $PT Re-evaluation: 1 Re-eval          Lazarus Gowda, PT, DPT   Acute Rehabilitation Department Pager #: [phone]  Gaetana Michaelis 06/07/2021, 11:27 AM

## 2021-06-07 NOTE — Progress Notes (Signed)
Patient ID: Katrina Lambert, female   DOB: 11-Mar-1986, 35 y.o.   MRN: [id number] Patient is awake and alert moving upper and lower extremities well.  Pain is under good control at the current time. Vital signs are stable Drain site reveals 200 cc of production overnight.  Will leave the drain intact for now.  Patient to mobilize more with physical therapy.  Continue to observe in hospital.

## 2021-06-07 NOTE — Evaluation (Signed)
Occupational Therapy REEvaluation Patient Details Name: Katrina Lambert MRN: [id number] DOB: 24-Sep-1986 Today's Date: 06/07/2021    History of Present Illness Pt is a 35 y/o female who presents s/p C3-7 ACDF. Now s/p C4-T1 posterior arthrodesis and C6-7 laminectomy. PMH significant for anxiety, depression, bipolar disorder, and neuromuscular disorder.   Clinical Impression   Patient evaluated by Occupational Therapy with no further acute OT needs identified. All education has been completed and the patient has no further questions. See below for any follow-up Occupational Therapy or equipment needs. OT to sign off. Thank you for referral.   Educated on how paper products might help with kitchen duties for the first 2 weeks and minimize use of UE while standing at sink. Pt does not have dish washer.      Follow Up Recommendations  No OT follow up    Equipment Recommendations  3 in 1 bedside commode;Other (comment) (RW)    Recommendations for Other Services       Precautions / Restrictions Precautions Precautions: Cervical;Fall Precaution Booklet Issued: Yes (comment) Precaution Comments: handout provided for fine motor cues for adls with cervical precautions Cervical Brace:  (no c-collar needed per neurosurgery note) Restrictions Weight Bearing Restrictions: No      Mobility Bed Mobility Overal bed mobility: Modified Independent             General bed mobility comments: OOB elevated and exiting with bed rail. pt reports family trying to get recliner for home at this time    Transfers Overall transfer level: Modified independent Equipment used: Rolling walker (2 wheeled)             General transfer comment: no assist requried, pt able to complete safely without cues with use of RW    Balance Overall balance assessment: No apparent balance deficits (not formally assessed)                                         ADL either performed or assessed  with clinical judgement   ADL Overall ADL's : Needs assistance/impaired Eating/Feeding: Independent   Grooming: Wash/dry hands               Lower Body Dressing: Independent   Toilet Transfer: Supervision/safety   Toileting- Clothing Manipulation and Hygiene: Supervision/safety Toileting - Clothing Manipulation Details (indicate cue type and reason): provided toilet aid tongs for d/c. educated on use and demo. Recommendation for [place] attachment to toilet and pt looking up on [facility] for possible purchse     Functional mobility during ADLs: Min guard;Rolling walker General ADL Comments: pt educated on bathing with clean wash cloth and washing around incision site. pt with photo of wounds on her personal phone this session so OT can educate pt easier. Pt educated collar is d/c at this time per order 7/22     Vision Baseline Vision/History: No visual deficits Vision Assessment?: No apparent visual deficits     Perception     Praxis      Pertinent Vitals/Pain Pain Assessment: Faces Pain Score: 9  Faces Pain Scale: Hurts even more Pain Location: neck Pain Descriptors / Indicators: Operative site guarding Pain Intervention(s): Monitored during session;Premedicated before session;Repositioned     Hand Dominance Right   Extremity/Trunk Assessment Upper Extremity Assessment Upper Extremity Assessment: Overall WFL for tasks assessed (reports she has dropped her phone a few times but also being very sleepy  when it happened after pain medications)   Lower Extremity Assessment Lower Extremity Assessment: Defer to PT evaluation LLE Deficits / Details: limited endurance, pt with no instances of buckling during session but pt reporting increaed weakness with continued activity   Cervical / Trunk Assessment Cervical / Trunk Assessment: Other exceptions Cervical / Trunk Exceptions: s/p cervical surgery second surgery 7/22   Communication Communication Communication: No  difficulties   Cognition Arousal/Alertness: Awake/alert Behavior During Therapy: [facility] for tasks assessed/performed Overall Cognitive Status: Within Functional Limits for tasks assessed                                 General Comments: pt with good recall of cervical precautions, able to apply to mobility   General Comments  VSS through session    Exercises Exercises: General Lower Extremity;Other exercises General Exercises - Lower Extremity Ankle Circles/Pumps: AROM;Both;15 reps;Seated Long Arc Quad: AROM;Left;10 reps;Seated Hip Flexion/Marching: AROM;Both;10 reps (walking) Other Exercises Other Exercises: step-up to 8 inch stair with BUE support x5 each leg   Shoulder Instructions      Home Living Family/patient expects to be discharged to:: Private residence Living Arrangements: Children Available Help at Discharge: Family Type of Home: Mobile home Home Access: Stairs to enter Entrance Stairs-Number of Steps: 4 Entrance Stairs-Rails: Right;Left;Can reach both Home Layout: One level     Bathroom Shower/Tub: Tub/shower unit;Walk-in shower   Bathroom Toilet: Standard     Home Equipment: Environmental consultant - 2 wheels   Additional Comments: has 20 yo daughter at home that can (A) ,      Prior Functioning/Environment Level of Independence: Independent with assistive device(s)        Comments: reports that she uses the wheelchair at the grocery store after she fell trying to get around the store, reports using RW as needed due to LLE giving out at times        OT Problem List:        OT Treatment/Interventions:      OT Goals(Current goals can be found in the care plan section) Acute Rehab OT Goals Patient Stated Goal: go home OT Goal Formulation: With patient  OT Frequency:     Barriers to D/C:            Co-evaluation              AM-PAC OT "6 Clicks" Daily Activity     Outcome Measure Help from another person eating meals?: None Help from  another person taking care of personal grooming?: None Help from another person toileting, which includes using toliet, bedpan, or urinal?: None Help from another person bathing (including washing, rinsing, drying)?: None Help from another person to put on and taking off regular upper body clothing?: None Help from another person to put on and taking off regular lower body clothing?: None 6 Click Score: 24   End of Session Equipment Utilized During Treatment: Rolling walker Nurse Communication: Mobility status;Precautions  Activity Tolerance: Patient tolerated treatment well Patient left: Other (comment) (up passing to PT for session)  OT Visit Diagnosis: Unsteadiness on feet (R26.81)                Time: 2409-7353 OT Time Calculation (min): 27 min Charges:  OT General Charges $OT Visit: 1 Visit OT Evaluation $OT Re-eval: 1 Re-eval OT Treatments $Self Care/Home Management : 8-22 mins   Brynn, OTR/L  Acute Rehabilitation Services Pager: [phone] Office: [phone] .  Mateo Flow 06/07/2021, 11:25 AM

## 2021-06-08 MED ORDER — BISACODYL 5 MG PO TBEC
10.0000 mg | DELAYED_RELEASE_TABLET | Freq: Every day | ORAL | Status: DC | PRN
  Administered 2021-06-08: 10 mg via ORAL
  Filled 2021-06-08: qty 2

## 2021-06-08 NOTE — Progress Notes (Signed)
Patient ID: Katrina Lambert, female   DOB: [birth year], 35 y.o.   MRN: [id number] BP 127/70 (BP Location: Left Arm)   Pulse 78   Temp 98.6 F (37 C) (Oral)   Resp 14   Ht 5\' 5"  (1.651 m)   Wt 114.8 kg   SpO2 96%   BMI 42.10 kg/m  Alert and oriented x 4, speech is clear, strong, fluent Moving all extremities Wounds are clean, dry, and intact Continue with hospital, drain remains in place with moderate amount of drainage

## 2021-06-09 MED ORDER — HYDROCODONE-ACETAMINOPHEN 10-325 MG PO TABS: 1.0000 | ORAL_TABLET | ORAL | 0 refills | Status: DC | PRN

## 2021-06-09 MED ORDER — METHOCARBAMOL 500 MG PO TABS: 500.0000 mg | ORAL_TABLET | Freq: Four times a day (QID) | ORAL | 0 refills | Status: DC | PRN

## 2021-06-09 MED ORDER — NICOTINE 21 MG/24HR TD PT24: 21.0000 mg | MEDICATED_PATCH | Freq: Every day | TRANSDERMAL | 0 refills | Status: DC

## 2021-06-09 NOTE — Social Work (Signed)
CSW received consult form RN, per patient's request- has questions about SSI.  CSW spoke with patient by phone- CSW advised patient to contact Social Security Office to apply for benefits and they will advised on how to proceed.  Patient states understanding.  Antony Blackbird, MSW, LCSW Clinical Social Worker

## 2021-06-09 NOTE — Anesthesia Postprocedure Evaluation (Signed)
Anesthesia Post Note  Patient: Katrina Lambert  Procedure(s) Performed: CERVICAL FOUR-FIVE, CERVICAL FIVE-SIX, CERVICAL SIX-SEVEN, CERVICAL SEVEN-THORACIC ONE POSTERIOR CERVICAL INSTRUMENTATION AND FUSION WITH CERVICAL FOUR-CERVICAL SEVEN LAMINECTOMY/DECOMPRESSION (Spine Cervical)     Patient location during evaluation: PACU Anesthesia Type: General Level of consciousness: awake and alert Pain management: pain level controlled Vital Signs Assessment: post-procedure vital signs reviewed and stable Respiratory status: spontaneous breathing, nonlabored ventilation, respiratory function stable and patient connected to nasal cannula oxygen Cardiovascular status: blood pressure returned to baseline and stable Postop Assessment: no apparent nausea or vomiting Anesthetic complications: no   No notable events documented.  Last Vitals:  Vitals:   06/09/21 0311 06/09/21 0814  BP: (!) 114/50 (!) 121/101  Pulse: 66   Resp:    Temp: 37.2 C 37.1 C  SpO2: 94%     Last Pain:  Vitals:   06/09/21 0814  TempSrc: Oral  PainSc:                  [name] S

## 2021-06-09 NOTE — Progress Notes (Signed)
   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  She requested prayer for her recovery, her father and other life situations.  She shared that her nurse on 7/24 was an angel sent specifically to her.  Such a pleasure speaking and encouraging Katrina Lambert.     Chaplain Jamiracle [facility] [place] [phone]

## 2021-06-09 NOTE — Plan of Care (Signed)
Patient is being discharged to home. She is doing well.

## 2021-06-09 NOTE — TOC Transition Note (Signed)
Transition of Care (TOC) - CM/SW Discharge Note Donn Pierini RN,BSN Transitions of Care Unit 4NP (non trauma) - RN Case Manager See Treatment Team for direct Phone #    Patient Details  Name: Katrina Lambert MRN: [id number] Date of Birth: 1986/05/29  Transition of Care [facility]) CM/SW Contact:  Darrold Span, RN Phone Number: 06/09/2021, 3:04 PM   Clinical Narrative:    Pt stable for transition home today, DME needs noted and orders placed- Call made to [employer] for DME referral- RW and 3n1 to be delivered to room prior to discharge.  CSW to see pt for social needs.    Final next level of care: Home/Self Care Barriers to Discharge: No Barriers Identified   Patient Goals and CMS Choice Patient states their goals for this hospitalization and ongoing recovery are:: return home CMS Medicare.gov Compare Post Acute Care list provided to:: Patient Choice offered to / list presented to : Patient  Discharge Placement                 home      Discharge Plan and Services In-house Referral: Clinical Social Work Discharge Planning Services: CM Consult Post Acute Care Choice: Durable Medical Equipment          DME Arranged: 3-N-1, Walker rolling DME Agency: [employer] Date DME Agency Contacted: 06/09/21 Time DME Agency Contacted: 1100 Representative spoke with at DME Agency: sheila HH Arranged: NA HH Agency: NA        Social Determinants of Health (SDOH) Interventions     Readmission Risk Interventions Readmission Risk Prevention Plan 06/09/2021  Post Dischage Appt Complete  Medication Screening Complete  Transportation Screening Complete  Some recent data might be hidden

## 2021-06-10 NOTE — Discharge Summary (Signed)
  Physician Discharge Summary  Patient ID: Katrina Lambert MRN: [id number] DOB/AGE: 1986-07-10 35 y.o.  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 female presented with cervical myelopathy due to cord compression and cervical stenosis at C4-7.  She underwent an anterior cervical discectomy and fusion C4-7 with C6 corpectomy on June 02, 2021.  She tolerated the surgery well.  She then underwent a posterior cervical instrumentation and fusion C4-T1 with C6, C7 laminectomies on June 06, 2021.  Patient tolerated the surgery well.  Postoperatively after both of her surgeries her preoperative symptoms of numbness, tingling and weakness significantly improved.  She was ambulating well.  PT/OT evaluated the patient agrees to go home with family supervision.  Her incisions were healing appropriately.  She was having normal bowel bladder function upon discharge.  Her pain was controlled on oral medications.  She was tolerating normal diet.  Treatments: Surgery -ACDF, C4-7 with C6 corpectomy on 06/02/2021, C4-T1 posterior cervical instrumentation and fusion, C6-7 laminectomy on 06/06/2021  Discharge Exam: Blood pressure 129/71, pulse 66, temperature 98.2 F (36.8 C), temperature source Oral, resp. rate 18, height 5\' 5"  (1.651 m), weight 114.8 kg, SpO2 96 %. Awake, alert, oriented x3 PERRLA Speech fluent, appropriate CN grossly intact 5/5 BUE/BLE Wounds c/d/I Neck soft, trachea midline, normal phonation  Disposition: Discharge disposition: 01-Home or Self Care       Discharge Instructions     Incentive spirometry RT   Complete by: As directed       Allergies as of 06/09/2021   No Known Allergies      Medication List     STOP taking these  medications    acetaminophen 500 MG tablet Commonly known as: TYLENOL   IBU 800 MG tablet Generic drug: ibuprofen       TAKE these medications    amLODipine 10 MG tablet Commonly known as: NORVASC Take 10 mg by mouth every morning.   HYDROcodone-acetaminophen 10-325 MG tablet Commonly known as: NORCO Take 1 tablet by mouth every 4 (four) hours as needed for moderate pain ((score 4 to 6)).   lisinopril-hydrochlorothiazide 10-12.5 MG tablet Commonly known as: ZESTORETIC Take 1 tablet by mouth daily.   methocarbamol 500 MG tablet Commonly known as: ROBAXIN Take 1 tablet (500 mg total) by mouth every 6 (six) hours as needed for muscle spasms.   metoprolol succinate 50 MG 24 hr tablet Commonly known as: TOPROL-XL Take 50 mg by mouth every morning.   nicotine 21 mg/24hr patch Commonly known as: NICODERM CQ - dosed in mg/24 hours Place 1 patch (21 mg total) onto the skin daily.        Follow-up Information     Wade Asebedo C, DO Follow up in 1 week(s).   Why: friday 230pm 06/13/2021 Contact information: [address] [place] [address] [place] [phone]         [phone] Oxygen Follow up.   Why: rolling walker and 3n1 arranged- to be delivered to room prior to discharge Contact information: [address] [facility] [place] [facility] [place] [phone]                 Signed: [phone] Millette Halberstam 06/10/2021, 5:15 PM

## 2021-06-13 ENCOUNTER — Encounter (HOSPITAL_COMMUNITY): Payer: Self-pay | Admitting: Neurological Surgery

## 2022-09-11 ENCOUNTER — Other Ambulatory Visit: Payer: Self-pay

## 2022-09-11 ENCOUNTER — Encounter (HOSPITAL_COMMUNITY): Payer: Self-pay | Admitting: *Deleted

## 2022-09-11 ENCOUNTER — Emergency Department (HOSPITAL_COMMUNITY)
Admission: EM | Admit: 2022-09-11 | Discharge: 2022-09-12 | Disposition: A | Discharge status: Other Acute Inpatient Hospital | Payer: Medicaid Other | Attending: Emergency Medicine | Admitting: Emergency Medicine

## 2022-09-11 DIAGNOSIS — R45851 Suicidal ideations: Secondary | ICD-10-CM | POA: Insufficient documentation

## 2022-09-11 DIAGNOSIS — J45909 Unspecified asthma, uncomplicated: Secondary | ICD-10-CM | POA: Insufficient documentation

## 2022-09-11 DIAGNOSIS — I10 Essential (primary) hypertension: Secondary | ICD-10-CM | POA: Insufficient documentation

## 2022-09-11 DIAGNOSIS — R4182 Altered mental status, unspecified: Secondary | ICD-10-CM | POA: Insufficient documentation

## 2022-09-11 DIAGNOSIS — Z1152 Encounter for screening for COVID-19: Secondary | ICD-10-CM | POA: Insufficient documentation

## 2022-09-11 DIAGNOSIS — F1290 Cannabis use, unspecified, uncomplicated: Secondary | ICD-10-CM | POA: Diagnosis present

## 2022-09-11 DIAGNOSIS — F1090 Alcohol use, unspecified, uncomplicated: Secondary | ICD-10-CM | POA: Diagnosis present

## 2022-09-11 DIAGNOSIS — Z789 Other specified health status: Secondary | ICD-10-CM | POA: Diagnosis present

## 2022-09-11 DIAGNOSIS — Z23 Encounter for immunization: Secondary | ICD-10-CM | POA: Insufficient documentation

## 2022-09-11 DIAGNOSIS — F322 Major depressive disorder, single episode, severe without psychotic features: Secondary | ICD-10-CM | POA: Diagnosis present

## 2022-09-11 LAB — CBC WITH DIFFERENTIAL/PLATELET
Abs Immature Granulocytes: 0.05 10*3/uL (ref 0.00–0.07)
Basophils Absolute: 0.1 10*3/uL (ref 0.0–0.1)
Basophils Relative: 1 %
Eosinophils Absolute: 0.2 10*3/uL (ref 0.0–0.5)
Eosinophils Relative: 2 %
HCT: 46.8 % — ABNORMAL HIGH (ref 36.0–46.0)
Hemoglobin: 15.7 g/dL — ABNORMAL HIGH (ref 12.0–15.0)
Immature Granulocytes: 0 %
Lymphocytes Relative: 30 %
Lymphs Abs: 3.8 10*3/uL (ref 0.7–4.0)
MCH: 30.5 pg (ref 26.0–34.0)
MCHC: 33.5 g/dL (ref 30.0–36.0)
MCV: 90.9 fL (ref 80.0–100.0)
Monocytes Absolute: 0.8 10*3/uL (ref 0.1–1.0)
Monocytes Relative: 6 %
Neutro Abs: 7.6 10*3/uL (ref 1.7–7.7)
Neutrophils Relative %: 61 %
Platelets: 323 10*3/uL (ref 150–400)
RBC: 5.15 MIL/uL — ABNORMAL HIGH (ref 3.87–5.11)
RDW: 13.6 % (ref 11.5–15.5)
WBC: 12.6 10*3/uL — ABNORMAL HIGH (ref 4.0–10.5)
nRBC: 0 % (ref 0.0–0.2)

## 2022-09-11 LAB — COMPREHENSIVE METABOLIC PANEL
ALT: 38 U/L (ref 0–44)
AST: 29 U/L (ref 15–41)
Albumin: 3.6 g/dL (ref 3.5–5.0)
Alkaline Phosphatase: 80 U/L (ref 38–126)
Anion gap: 10 (ref 5–15)
BUN: 8 mg/dL (ref 6–20)
CO2: 23 mmol/L (ref 22–32)
Calcium: 8.6 mg/dL — ABNORMAL LOW (ref 8.9–10.3)
Chloride: 107 mmol/L (ref 98–111)
Creatinine, Ser: 0.68 mg/dL (ref 0.44–1.00)
GFR, Estimated: >60 mL/min (ref >60)
Glucose, Bld: 108 mg/dL — ABNORMAL HIGH (ref 70–99)
Potassium: 3.4 mmol/L — ABNORMAL LOW (ref 3.5–5.1)
Sodium: 140 mmol/L (ref 135–145)
Total Bilirubin: 0.4 mg/dL (ref 0.3–1.2)
Total Protein: 7.3 g/dL (ref 6.5–8.1)

## 2022-09-11 LAB — RESP PANEL BY RT-PCR (FLU A&B, COVID) ARPGX2
Influenza A by PCR: NEGATIVE
Influenza B by PCR: NEGATIVE
SARS Coronavirus 2 by RT PCR: NEGATIVE

## 2022-09-11 LAB — RAPID URINE DRUG SCREEN, HOSP PERFORMED
Amphetamines: NOT DETECTED
Barbiturates: NOT DETECTED
Benzodiazepines: NOT DETECTED
Cocaine: NOT DETECTED
Opiates: NOT DETECTED
Tetrahydrocannabinol: POSITIVE — AB

## 2022-09-11 LAB — PREGNANCY, URINE: Preg Test, Ur: NEGATIVE

## 2022-09-11 LAB — ETHANOL: Alcohol, Ethyl (B): 142 mg/dL — ABNORMAL HIGH (ref <10)

## 2022-09-11 MED ORDER — LORAZEPAM 1 MG PO TABS: 1.0000 mg | ORAL_TABLET | Freq: Every day | ORAL | Status: DC

## 2022-09-11 MED ORDER — LORAZEPAM 1 MG PO TABS: 1.0000 mg | ORAL_TABLET | Freq: Three times a day (TID) | ORAL | Status: DC

## 2022-09-11 MED ORDER — NICOTINE 21 MG/24HR TD PT24
21.0000 mg | MEDICATED_PATCH | Freq: Every day | TRANSDERMAL | Status: DC
  Administered 2022-09-11 – 2022-09-12 (×2): 21 mg via TRANSDERMAL
  Filled 2022-09-11 (×2): qty 1

## 2022-09-11 MED ORDER — BUSPIRONE HCL 5 MG PO TABS
5.0000 mg | ORAL_TABLET | Freq: Two times a day (BID) | ORAL | Status: DC
  Administered 2022-09-11 – 2022-09-12 (×3): 5 mg via ORAL
  Filled 2022-09-11 (×3): qty 1

## 2022-09-11 MED ORDER — MIRTAZAPINE 15 MG PO TABS
15.0000 mg | ORAL_TABLET | Freq: Every day | ORAL | Status: DC
  Administered 2022-09-11: 15 mg via ORAL
  Filled 2022-09-11: qty 1

## 2022-09-11 MED ORDER — TETANUS-DIPHTH-ACELL PERTUSSIS 5-2.5-18.5 LF-MCG/0.5 IM SUSY
0.5000 mL | PREFILLED_SYRINGE | Freq: Once | INTRAMUSCULAR | Status: AC
  Administered 2022-09-11: 0.5 mL via INTRAMUSCULAR
  Filled 2022-09-11: qty 0.5

## 2022-09-11 MED ORDER — LORAZEPAM 1 MG PO TABS
1.0000 mg | ORAL_TABLET | Freq: Four times a day (QID) | ORAL | Status: AC
  Administered 2022-09-11 – 2022-09-12 (×4): 1 mg via ORAL
  Filled 2022-09-11 (×4): qty 1

## 2022-09-11 MED ORDER — LOPERAMIDE HCL 2 MG PO CAPS: 2.0000 mg | ORAL_CAPSULE | ORAL | Status: DC | PRN

## 2022-09-11 MED ORDER — THIAMINE HCL 100 MG/ML IJ SOLN
100.0000 mg | Freq: Once | INTRAMUSCULAR | Status: AC
  Administered 2022-09-11: 100 mg via INTRAMUSCULAR
  Filled 2022-09-11: qty 2

## 2022-09-11 MED ORDER — HYDROXYZINE HCL 25 MG PO TABS
25.0000 mg | ORAL_TABLET | Freq: Four times a day (QID) | ORAL | Status: DC | PRN
  Administered 2022-09-11: 25 mg via ORAL
  Filled 2022-09-11: qty 1

## 2022-09-11 MED ORDER — ONDANSETRON 4 MG PO TBDP: 4.0000 mg | ORAL_TABLET | Freq: Four times a day (QID) | ORAL | Status: DC | PRN

## 2022-09-11 MED ORDER — BACITRACIN ZINC 500 UNIT/GM EX OINT
TOPICAL_OINTMENT | Freq: Two times a day (BID) | CUTANEOUS | Status: DC
  Filled 2022-09-11: qty 0.9
  Filled 2022-09-11: qty 1.8

## 2022-09-11 MED ORDER — THIAMINE MONONITRATE 100 MG PO TABS
100.0000 mg | ORAL_TABLET | Freq: Every day | ORAL | Status: DC
  Administered 2022-09-12: 100 mg via ORAL
  Filled 2022-09-11: qty 1

## 2022-09-11 MED ORDER — ADULT MULTIVITAMIN W/MINERALS CH
1.0000 | ORAL_TABLET | Freq: Every day | ORAL | Status: DC
  Administered 2022-09-11 – 2022-09-12 (×2): 1 via ORAL
  Filled 2022-09-11 (×2): qty 1

## 2022-09-11 MED ORDER — AMLODIPINE BESYLATE 5 MG PO TABS
5.0000 mg | ORAL_TABLET | Freq: Every morning | ORAL | Status: DC
  Administered 2022-09-11 – 2022-09-12 (×2): 5 mg via ORAL
  Filled 2022-09-11 (×2): qty 1

## 2022-09-11 MED ORDER — ALBUTEROL SULFATE HFA 108 (90 BASE) MCG/ACT IN AERS
2.0000 | INHALATION_SPRAY | Freq: Four times a day (QID) | RESPIRATORY_TRACT | Status: DC | PRN
  Administered 2022-09-11: 2 via RESPIRATORY_TRACT
  Filled 2022-09-11: qty 6.7

## 2022-09-11 MED ORDER — LORAZEPAM 1 MG PO TABS: 1.0000 mg | ORAL_TABLET | Freq: Four times a day (QID) | ORAL | Status: DC | PRN

## 2022-09-11 MED ORDER — LORAZEPAM 1 MG PO TABS: 1.0000 mg | ORAL_TABLET | Freq: Two times a day (BID) | ORAL | Status: DC

## 2022-09-11 NOTE — ED Triage Notes (Addendum)
Pt brought in by [facility] from with c/o SI. Alcohol and marijuana use last night into this morning. Pt states "I got too drunk and had an outburst". Pt called her friend and the friend activated 71. Pt recently lost her father and has been dealing with depression. Pt reports she doesn't take her medications due to not having insurance. BP 167/94, HR 110, RR 20, O2 sat 100% RA, CBG 119 for EMS. EMS reports pt has superficial lacerations to left forearm from SI attempt this morning. Bleeding controlled.

## 2022-09-11 NOTE — ED Notes (Signed)
RN cleaned left inner forearm wounds and applied bacitracin ointment. Dressed wounds with nonadherent dressing, rolled gauze, and coban. Capillary refill less than 3 seconds and sensation remains intact.

## 2022-09-11 NOTE — ED Provider Notes (Addendum)
Patient is medically cleared and can be seen by behavioral health   Katrina Ferguson, MD 09/11/22 1255    Katrina Ferguson, MD 09/11/22 1433

## 2022-09-11 NOTE — ED Notes (Signed)
Per pt request, please do not share medical/personal information with Lamaya Hyneman (spouse, separated). OK to share info with pt's daughters by phone.

## 2022-09-11 NOTE — Consult Note (Cosign Needed Addendum)
Telepsych Consultation   Reason for Consult:  suicidal Referring Physician:  Bethann Berkshire, MD Location of Patient:  [facility] [phone] Location of Provider: Behavioral Health TTS Department  Patient Identification: Katrina Lambert MRN:  [id number] Principal Diagnosis: Major depressive disorder, single episode, severe (HCC) Diagnosis:  Principal Problem:   Major depressive disorder, single episode, severe (HCC) Active Problems:   Alcohol use   Marijuana use   Total Time spent with patient: 30 minutes  Subjective:   Katrina Lambert is a 36 y.o. female patient admitted with suicidal ideations.  "Just having a bad time lately. Not doing good at all. I lost my dad in 04-07-2023".   Patient presents calm and cooperative, alert and oriented. Eyes swollen from crying. 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 Unemployed. X2 children who live with their father following medical issues last year. Currently lives alone. Denies any history of psychiatric hospitalization; attended therapy x1 month about 7- 8 years ago. Started medication for x1 month, but was unable to follow-up. Both parents had bipolar diagnoses. Lost mother x13 years ago, children were small and kept busy. Reports drinking 3/7 days; 1/2 L bottle, typically shares with someone else. Wasn't drinking prior to father's death. Doesn't feel drinking is a problem at this time. Denies any history of withdrawal or abuse. Smokes marijuana daily; feels it may be a problem due to mental dependency.   Endorses ongoing thoughts of suicide that have intensified since death of father. Feels she never really grieved mother and feels the two deaths have been compounding. Intermittently tearful. She continues to endorse active suicidal ideations and intense sorrow. Denies any homicidal ideations, auditory or visual hallucinations. She is amenable to treatment and agrees to sign voluntary care.   HPI:  Katrina Lambert is a 36 year old patient  with a past psychiatric history of anxiety, depression, bipolar disorder who presented to [facility] via EMS with complaints of SI. Friends report patient recently lost father has been experiencing increased depression and stress. Superficial lacerations noted to left forearm from SI attempt earlier; bleeding controlled. BAL 142, UDS+THC.   Past Psychiatric History: anxiety, depression, bipolar disorder  Risk to Self:  yes Risk to Others:  no Prior Inpatient Therapy:  no Prior Outpatient Therapy:  yes  Past Medical History:  Past Medical History:  Diagnosis Date   Anxiety    Arthritis    bilateral hips and knees   Asthma    Bipolar disorder (HCC)    Depression    Dyspnea    GERD (gastroesophageal reflux disease)    Headache    Hypertension    Neuromuscular disorder (HCC)    PONV (postoperative nausea and vomiting)    Pre-diabetes     Past Surgical History:  Procedure Laterality Date   ANTERIOR CERVICAL DECOMP/DISCECTOMY FUSION N/A 06/02/2021   Procedure: ANTERIOR CERVICAL DISCECTOMY FUSION CERVICAL FOUR-FIVE, CERVICAL FIVE-SIX, CERVICAL SIX-SEVEN WITH CERVICAL SIX CORPECTOMY;  Surgeon: Dawley, Alan Mulder, DO;  Location: [facility] OR;  Service: Neurosurgery;  Laterality: N/A;   CESAREAN SECTION     x 2   CHOLECYSTECTOMY     POSTERIOR CERVICAL FUSION/FORAMINOTOMY N/A 06/06/2021   Procedure: CERVICAL FOUR-FIVE, CERVICAL FIVE-SIX, CERVICAL SIX-SEVEN, CERVICAL SEVEN-THORACIC ONE POSTERIOR CERVICAL INSTRUMENTATION AND FUSION WITH CERVICAL FOUR-CERVICAL SEVEN LAMINECTOMY/DECOMPRESSION;  Surgeon: Dawley, Alan Mulder, DO;  Location: [facility] OR;  Service: Neurosurgery;  Laterality: N/A;   Family History: No family history on file. Family Psychiatric  History: Bipolar: mother, father Social History:  Social  History   Substance and Sexual Activity  Alcohol Use Yes   Comment: 2-3 a week     Social History   Substance and Sexual Activity  Drug Use Yes   Frequency: 7.0 times per week   Types: Marijuana    Comment: dailiy use    Social History   Socioeconomic History   Marital status: Married    Spouse name: Not on file   Number of children: Not on file   Years of education: Not on file   Highest education level: Not on file  Occupational History   Not on file  Tobacco Use   Smoking status: Every Day    Packs/day: 1.50    Years: 22.00    Total pack years: 33.00    Types: Cigarettes   Smokeless tobacco: Former  Building services engineerVaping Use   Vaping Use: Never used  Substance and Sexual Activity   Alcohol use: Yes    Comment: 2-3 a week   Drug use: Yes    Frequency: 7.0 times per week    Types: Marijuana    Comment: dailiy use   Sexual activity: Not on file  Other Topics Concern   Not on file  Social History Narrative   Not on file   Social Determinants of Health   Financial Resource Strain: Not on file  Food Insecurity: Not on file  Transportation Needs: Not on file  Physical Activity: Not on file  Stress: Not on file  Social Connections: Not on file   Additional Social History:    Allergies:  No Known Allergies  Labs:  Results for orders placed or performed during the hospital encounter of 09/11/22 (from the past 48 hour(s))  Rapid urine drug screen (hospital performed)     Status: Abnormal   Collection Time: 09/11/22 11:19 AM  Result Value Ref Range   Opiates NONE DETECTED NONE DETECTED   Cocaine NONE DETECTED NONE DETECTED   Benzodiazepines NONE DETECTED NONE DETECTED   Amphetamines NONE DETECTED NONE DETECTED   Tetrahydrocannabinol POSITIVE (A) NONE DETECTED   Barbiturates NONE DETECTED NONE DETECTED    Comment: (NOTE) DRUG SCREEN FOR MEDICAL PURPOSES ONLY.  IF CONFIRMATION IS NEEDED FOR ANY PURPOSE, NOTIFY LAB WITHIN 5 DAYS.  LOWEST DETECTABLE LIMITS FOR URINE DRUG SCREEN Drug Class                     Cutoff (ng/mL) Amphetamine and metabolites    1000 Barbiturate and metabolites    200 Benzodiazepine                 200 Opiates and metabolites         300 Cocaine and metabolites        300 THC                            50 Performed at [facility], [address] [place], [place] [address]   CBC with Differential     Status: Abnormal   Collection Time: 09/11/22 11:48 AM  Result Value Ref Range   WBC 12.6 (H) 4.0 - 10.5 K/uL   RBC 5.15 (H) 3.87 - 5.11 MIL/uL   Hemoglobin 15.7 (H) 12.0 - 15.0 g/dL   HCT 60.446.8 (H) 54.036.0 - 98.146.0 %   MCV 90.9 80.0 - 100.0 fL   MCH 30.5 26.0 - 34.0 pg   MCHC 33.5 30.0 - 36.0 g/dL   RDW 19.113.6 47.811.5 - 29.515.5 %  Platelets 323 150 - 400 K/uL   nRBC 0.0 0.0 - 0.2 %   Neutrophils Relative % 61 %   Neutro Abs 7.6 1.7 - 7.7 K/uL   Lymphocytes Relative 30 %   Lymphs Abs 3.8 0.7 - 4.0 K/uL   Monocytes Relative 6 %   Monocytes Absolute 0.8 0.1 - 1.0 K/uL   Eosinophils Relative 2 %   Eosinophils Absolute 0.2 0.0 - 0.5 K/uL   Basophils Relative 1 %   Basophils Absolute 0.1 0.0 - 0.1 K/uL   Immature Granulocytes 0 %   Abs Immature Granulocytes 0.05 0.00 - 0.07 K/uL    Comment: Performed at [facility], [address] [place], [place] [address]  Comprehensive metabolic panel     Status: Abnormal   Collection Time: 09/11/22 11:48 AM  Result Value Ref Range   Sodium 140 135 - 145 mmol/L   Potassium 3.4 (L) 3.5 - 5.1 mmol/L   Chloride 107 98 - 111 mmol/L   CO2 23 22 - 32 mmol/L   Glucose, Bld 108 (H) 70 - 99 mg/dL    Comment: Glucose reference range applies only to samples taken after fasting for at least 8 hours.   BUN 8 6 - 20 mg/dL   Creatinine, Ser 0.68 0.44 - 1.00 mg/dL   Calcium 8.6 (L) 8.9 - 10.3 mg/dL   Total Protein 7.3 6.5 - 8.1 g/dL   Albumin 3.6 3.5 - 5.0 g/dL   AST 29 15 - 41 U/L   ALT 38 0 - 44 U/L   Alkaline Phosphatase 80 38 - 126 U/L   Total Bilirubin 0.4 0.3 - 1.2 mg/dL   GFR, Estimated >60 >60 mL/min    Comment: (NOTE) Calculated using the CKD-EPI Creatinine Equation (2021)    Anion gap 10 5 - 15    Comment: Performed at [facility], [address], [place], [place] [address]  Ethanol      Status: Abnormal   Collection Time: 09/11/22 11:48 AM  Result Value Ref Range   Alcohol, Ethyl (B) 142 (H) <10 mg/dL    Comment: (NOTE) Lowest detectable limit for serum alcohol is 10 mg/dL.  For medical purposes only. Performed at [facility], [address], [place], [place] [address]     Medications:  Current Facility-Administered Medications  Medication Dose Route Frequency Provider Last Rate Last Admin   amLODipine (NORVASC) tablet 5 mg  5 mg Oral q morning Leevy-Johnson, Tylor Gambrill A, NP   5 mg at 09/11/22 1457   busPIRone (BUSPAR) tablet 5 mg  5 mg Oral BID Leevy-Johnson, Tailor Westfall A, NP       hydrOXYzine (ATARAX) tablet 25 mg  25 mg Oral Q6H PRN Leevy-Johnson, Lyndsi Altic A, NP       loperamide (IMODIUM) capsule 2-4 mg  2-4 mg Oral PRN Leevy-Johnson, Daune Divirgilio A, NP       LORazepam (ATIVAN) tablet 1 mg  1 mg Oral Q6H PRN Leevy-Johnson, Shandrea Lusk A, NP       LORazepam (ATIVAN) tablet 1 mg  1 mg Oral QID Leevy-Johnson, Cleon Thoma A, NP   1 mg at 09/11/22 1457   Followed by   Derrill Memo ON 09/12/2022] LORazepam (ATIVAN) tablet 1 mg  1 mg Oral TID Leevy-Johnson, Betrice Wanat A, NP       Followed by   Derrill Memo ON 09/13/2022] LORazepam (ATIVAN) tablet 1 mg  1 mg Oral BID Leevy-Johnson, Jafeth Mustin A, NP       Followed by   Derrill Memo ON 09/15/2022] LORazepam (ATIVAN) tablet 1  mg  1 mg Oral Daily Leevy-Johnson, Oprah Camarena A, NP       mirtazapine (REMERON) tablet 15 mg  15 mg Oral QHS Leevy-Johnson, Meredith Mells A, NP       multivitamin with minerals tablet 1 tablet  1 tablet Oral Daily Leevy-Johnson, Silvie Obremski A, NP   1 tablet at 09/11/22 1457   nicotine (NICODERM CQ - dosed in mg/24 hours) patch 21 mg  21 mg Transdermal Daily Leevy-Johnson, Maralee Higuchi A, NP   21 mg at 09/11/22 1456   ondansetron (ZOFRAN-ODT) disintegrating tablet 4 mg  4 mg Oral Q6H PRN Leevy-Johnson, Amberlee Garvey A, NP       [START ON 09/12/2022] thiamine (VITAMIN B1) tablet 100 mg  100 mg Oral Daily Leevy-Johnson, Oneika Simonian A, NP       Current Outpatient Medications   Medication Sig Dispense Refill   amLODipine (NORVASC) 10 MG tablet Take 10 mg by mouth every morning. (Patient not taking: Reported on 09/11/2022)     HYDROcodone-acetaminophen (NORCO) 10-325 MG tablet Take 1 tablet by mouth every 4 (four) hours as needed for moderate pain ((score 4 to 6)). (Patient not taking: Reported on 09/11/2022) 30 tablet 0   lisinopril-hydrochlorothiazide (ZESTORETIC) 10-12.5 MG tablet Take 1 tablet by mouth daily. (Patient not taking: Reported on 09/11/2022)     methocarbamol (ROBAXIN) 500 MG tablet Take 1 tablet (500 mg total) by mouth every 6 (six) hours as needed for muscle spasms. (Patient not taking: Reported on 09/11/2022) 120 tablet 0   metoprolol succinate (TOPROL-XL) 50 MG 24 hr tablet Take 50 mg by mouth every morning. (Patient not taking: Reported on 09/11/2022)     nicotine (NICODERM CQ - DOSED IN MG/24 HOURS) 21 mg/24hr patch Place 1 patch (21 mg total) onto the skin daily. (Patient not taking: Reported on 09/11/2022) 28 patch 0    Musculoskeletal: Strength & Muscle Tone: within normal limits Gait & Station: normal Patient leans: N/A  Psychiatric Specialty Exam:  Presentation  General Appearance: Casual  Eye Contact:Good  Speech:Clear and Coherent  Speech Volume:Normal  Handedness:Right   Mood and Affect  Mood:Depressed; Dysphoric  Affect:Blunt; Depressed   Thought Process  Thought Processes:Goal Directed  Descriptions of Associations:Intact  Orientation:Full (Time, Place and Person)  Thought Content:Logical  History of Schizophrenia/Schizoaffective disorder:No data recorded Duration of Psychotic Symptoms:No data recorded Hallucinations:Hallucinations: None  Ideas of Reference:None  Suicidal Thoughts:Suicidal Thoughts: Yes, Active SI Active Intent and/or Plan: With Means to Carry Out; With Access to Means  Homicidal Thoughts:Homicidal Thoughts: No   Sensorium  Memory:Immediate Fair; Recent  Fair  Judgment:Fair  Insight:Fair   Executive Functions  Concentration:Fair  Attention Span:Fair  Recall:Fair  Fund of Knowledge:Good  Language:Good   Psychomotor Activity  Psychomotor Activity:Psychomotor Activity: Normal   Assets  Assets:Physical Health; Resilience; Housing   Sleep  Sleep:Sleep: Poor    Physical Exam: Physical Exam Vitals and nursing note reviewed.  Constitutional:      Appearance: She is obese.  HENT:     Head: Normocephalic.     Nose: Nose normal.     Mouth/Throat:     Pharynx: Oropharynx is clear.  Cardiovascular:     Rate and Rhythm: Normal rate.     Pulses: Normal pulses.  Pulmonary:     Effort: Pulmonary effort is normal.  Abdominal:     Palpations: Abdomen is soft.  Musculoskeletal:        General: Normal range of motion.  Skin:    General: Skin is warm.  Neurological:     Mental  Status: She is alert and oriented to person, place, and time.  Psychiatric:        Attention and Perception: Attention and perception normal.        Mood and Affect: Mood is depressed. Affect is tearful.        Speech: Speech normal.        Behavior: Behavior is withdrawn. Behavior is cooperative.        Thought Content: Thought content is not paranoid or delusional. Thought content includes suicidal ideation. Thought content does not include homicidal ideation. Thought content includes suicidal plan. Thought content does not include homicidal plan.        Cognition and Memory: Cognition and memory normal.        Judgment: Judgment normal.    Review of Systems  Psychiatric/Behavioral:  Positive for depression, substance abuse and suicidal ideas. The patient is nervous/anxious and has insomnia.   All other systems reviewed and are negative.  Blood pressure (!) 180/114, pulse (!) 109, temperature 98 F (36.7 C), temperature source Oral, resp. rate 20, height 5\' 5"  (1.651 m), weight 117.9 kg, SpO2 95 %. Body mass index is 43.27 kg/m.  Treatment  Plan Summary: Daily contact with patient to assess and evaluate symptoms and progress in treatment, Medication management, and Plan   PLAN:  - Seek inpatient psychiatric hospitalization  Start:   - CIWA protocol for Alcohol withdrawal  - Buspirone 5 mg BID daily: anxiety symptoms  - Mirtazapine 15 mg daily HS: depression symptoms  Restart:   - Nicotine patch 21 g - smoking cessation - Amlodipine 5 mg daily (previously rx 10 mg)  last dose unknown - hypertension  Disposition: Recommend psychiatric Inpatient admission when medically cleared. Supportive therapy provided about ongoing stressors. Discussed crisis plan, support from social network, calling 911, coming to the Emergency Department, and calling Suicide Hotline.  This service was provided via telemedicine using a 2-way, interactive audio and video technology.  Names of all persons participating in this telemedicine service and their role in this encounter. Name: Role: PMHNP  Name: Maxie Barb Role: Attending MD  Name: Katrina Lambert Role: patient  Name:  Role:     Burna Forts, NP 09/11/2022 3:06 PM

## 2022-09-11 NOTE — ED Provider Notes (Signed)
[facility] EMERGENCY DEPARTMENT Provider Note   CSN: [id number] Arrival date & time: 09/11/22  1051     History {Add pertinent medical, surgical, social history, OB history to HPI:1} Chief Complaint  Patient presents with   Suicidal    Katrina Lambert is a 36 y.o. female.  Patient has a history of asthma bipolar, patient states he is depressed and tried to kill herself by cutting her arm.  Patient is still suicidal   Altered Mental Status Presenting symptoms: behavior changes   Severity:  Severe Most recent episode:  More than 2 days ago Episode history:  Continuous Timing:  Constant Progression:  Worsening Chronicity:  New Context: alcohol use   Associated symptoms: no abdominal pain, no hallucinations, no headaches, no rash and no seizures        Home Medications Prior to Admission medications   Medication Sig Start Date End Date Taking? Authorizing Provider  amLODipine (NORVASC) 10 MG tablet Take 10 mg by mouth every morning. Patient not taking: Reported on 09/11/2022 01/06/21   [provider]  HYDROcodone-acetaminophen (NORCO) 10-325 MG tablet Take 1 tablet by mouth every 4 (four) hours as needed for moderate pain ((score 4 to 6)). Patient not taking: Reported on 09/11/2022 06/09/21   Dawley, Pieter Partridge C, DO  lisinopril-hydrochlorothiazide (ZESTORETIC) 10-12.5 MG tablet Take 1 tablet by mouth daily. Patient not taking: Reported on 09/11/2022 01/06/21   [provider]  methocarbamol (ROBAXIN) 500 MG tablet Take 1 tablet (500 mg total) by mouth every 6 (six) hours as needed for muscle spasms. Patient not taking: Reported on 09/11/2022 06/09/21   Dawley, Troy C, DO  metoprolol succinate (TOPROL-XL) 50 MG 24 hr tablet Take 50 mg by mouth every morning. Patient not taking: Reported on 09/11/2022 01/09/21   [provider]  nicotine (NICODERM CQ - DOSED IN MG/24 HOURS) 21 mg/24hr patch Place 1 patch (21 mg total) onto the skin daily. Patient not taking:  Reported on 09/11/2022 06/09/21   Dawley, Theodoro Doing, DO      Allergies    Patient has no known allergies.    Review of Systems   Review of Systems  Constitutional:  Negative for appetite change and fatigue.  HENT:  Negative for congestion, ear discharge and sinus pressure.   Eyes:  Negative for discharge.  Respiratory:  Negative for cough.   Cardiovascular:  Negative for chest pain.  Gastrointestinal:  Negative for abdominal pain and diarrhea.  Genitourinary:  Negative for frequency and hematuria.  Musculoskeletal:  Negative for back pain.  Skin:  Negative for rash.  Neurological:  Negative for seizures and headaches.  Psychiatric/Behavioral:  Positive for dysphoric mood and self-injury. Negative for hallucinations.     Physical Exam Updated Vital Signs BP (!) 157/104 (BP Location: Left Arm)   Pulse (!) 113   Temp 98 F (36.7 C) (Oral)   Resp 16   Ht 5\' 5"  (1.651 m)   Wt 117.9 kg   SpO2 96%   BMI 43.27 kg/m  Physical Exam Vitals and nursing note reviewed.  Constitutional:      Appearance: She is well-developed.  HENT:     Head: Normocephalic.     Mouth/Throat:     Mouth: Mucous membranes are moist.  Eyes:     General: No scleral icterus.    Conjunctiva/sclera: Conjunctivae normal.  Neck:     Thyroid: No thyromegaly.  Cardiovascular:     Rate and Rhythm: Normal rate and regular rhythm.     Heart sounds:  No murmur heard.    No friction rub. No gallop.  Pulmonary:     Breath sounds: No stridor. No wheezing or rales.  Chest:     Chest wall: No tenderness.  Abdominal:     General: There is no distension.     Tenderness: There is no abdominal tenderness. There is no rebound.  Musculoskeletal:        General: Normal range of motion.     Cervical back: Neck supple.  Lymphadenopathy:     Cervical: No cervical adenopathy.  Skin:    Findings: No erythema or rash.  Neurological:     Mental Status: She is alert and oriented to person, place, and time.     Motor: No  abnormal muscle tone.     Coordination: Coordination normal.  Psychiatric:     Comments: Suicidal     ED Results / Procedures / Treatments   Labs (all labs ordered are listed, but only abnormal results are displayed) Labs Reviewed  CBC WITH DIFFERENTIAL/PLATELET - Abnormal; Notable for the following components:      Result Value   WBC 12.6 (*)    RBC 5.15 (*)    Hemoglobin 15.7 (*)    HCT 46.8 (*)    All other components within normal limits  COMPREHENSIVE METABOLIC PANEL - Abnormal; Notable for the following components:   Potassium 3.4 (*)    Glucose, Bld 108 (*)    Calcium 8.6 (*)    All other components within normal limits  RAPID URINE DRUG SCREEN, HOSP PERFORMED - Abnormal; Notable for the following components:   Tetrahydrocannabinol POSITIVE (*)    All other components within normal limits  ETHANOL - Abnormal; Notable for the following components:   Alcohol, Ethyl (B) 142 (*)    All other components within normal limits    EKG None  Radiology No results found.  Procedures Procedures  {Document cardiac monitor, telemetry assessment procedure when appropriate:1}  Medications Ordered in ED Medications  thiamine (VITAMIN B1) injection 100 mg (has no administration in time range)  thiamine (VITAMIN B1) tablet 100 mg (has no administration in time range)  multivitamin with minerals tablet 1 tablet (has no administration in time range)  LORazepam (ATIVAN) tablet 1 mg (has no administration in time range)  hydrOXYzine (ATARAX) tablet 25 mg (has no administration in time range)  loperamide (IMODIUM) capsule 2-4 mg (has no administration in time range)  ondansetron (ZOFRAN-ODT) disintegrating tablet 4 mg (has no administration in time range)  LORazepam (ATIVAN) tablet 1 mg (has no administration in time range)    Followed by  LORazepam (ATIVAN) tablet 1 mg (has no administration in time range)    Followed by  LORazepam (ATIVAN) tablet 1 mg (has no administration in  time range)    Followed by  LORazepam (ATIVAN) tablet 1 mg (has no administration in time range)  nicotine (NICODERM CQ - dosed in mg/24 hours) patch 21 mg (has no administration in time range)  amLODipine (NORVASC) tablet 5 mg (has no administration in time range)  Tdap (BOOSTRIX) injection 0.5 mL (0.5 mLs Intramuscular Given 09/11/22 1127)    ED Course/ Medical Decision Making/ A&P  Patient is suicidal and medically cleared.  He will be seen by behavioral health                         Medical Decision Making Amount and/or Complexity of Data Reviewed Labs: ordered. ECG/medicine tests: ordered.  Risk Prescription drug  management.   Suicidal ideations  {Document critical care time when appropriate:1} {Document review of labs and clinical decision tools ie heart score, Chads2Vasc2 etc:1}  {Document your independent review of radiology images, and any outside records:1} {Document your discussion with family members, caretakers, and with consultants:1} {Document social determinants of health affecting pt's care:1} {Document your decision making why or why not admission, treatments were needed:1} Final Clinical Impression(s) / ED Diagnoses Final diagnoses:  None    Rx / DC Orders ED Discharge Orders     None

## 2022-09-11 NOTE — Progress Notes (Signed)
Inpatient Behavioral Health  Pt meets inpatient criteria per Inda Merlin, NP. There are no available beds at [facility]. Referral was sent to the following facilities;    Destination Service Provider Address Phone Fax  [facility]  [place], [place] [address] [place]  [facility]  [address] [place] [address] [phone]  [facility]  [place], [place] [address] [phone]  [facility]  [place] [place] [address] [phone] [address] [place] [address] [phone] [facility]  [address] [place] [address] [phone] [place]  [address] [place] [address] [phone]  [place]  [address], [place] [address] [phone] [facility]  [address] [place] [address] [phone] [facility]  [address] [place] [address] [phone]  [facility]  [address] [place] [address] [phone]  [facility]  [place], [place] [address] [facility]  [address], [place] [address] [phone] [facility]  [address], [place] [address] [phone]   Situation ongoing,  CSW will follow up.   Benjaman Kindler, MSW, LCSWA 09/11/2022  @ 5:18 PM

## 2022-09-12 MED ORDER — IBUPROFEN 800 MG PO TABS
800.0000 mg | ORAL_TABLET | Freq: Once | ORAL | Status: AC
  Administered 2022-09-12: 800 mg via ORAL
  Filled 2022-09-12: qty 1

## 2022-09-12 NOTE — ED Notes (Signed)
Per Colbert Ewing, Per Quillian Quince [place] admission , pt has been accepted to [facility]. Accepting provider is Dr. Arnold Long. Patient can arrive anytime. Number for report is [phone].

## 2022-09-12 NOTE — Progress Notes (Signed)
CSW faxed COVID results to [facility] for review at the request of the facility with acceptance information.  Glennie Isle, MSW, Laurence Compton Phone: [phone] Disposition/TOC

## 2022-09-12 NOTE — Progress Notes (Signed)
Per Quillian Quince [place] admission , pt has been accepted to [facility]. Accepting provider is Dr. Arnold Long. Patient can arrive anytime. Number for report is [phone].   Glennie Isle, MSW, LCSW-A Phone: [phone] Disposition/TOC

## 2022-09-12 NOTE — ED Provider Notes (Addendum)
I was approached by nursing that the patient was having right ear pain for multiple hours and no one had looked at it yet.  Patient states is been going on for little bit over a day.  No fevers.  No respiratory symptoms.  On evaluation her tympanic membrane is intact with no clear effusion, bulging or erythema.  She did have little bit of tenderness just anterior to the ear with mild swelling.  No erythema there.  No worsening with jaw motion.  Low suspicion for abscess, mastoiditis or other emergent etiology.  We will start with ibuprofen and see if that helps.  No indication for imaging at this time.  This should not preclude her consultation and placement by mental health.   Mandi Mattioli, Corene Cornea, MD 09/12/22 5400    Merrily Pew, MD 09/12/22 [phone]

## 2022-09-12 NOTE — ED Provider Notes (Signed)
Emergency Medicine Observation Re-evaluation Note  Katrina Lambert is a 36 y.o. female, seen on rounds today.  Pt initially presented to the ED for complaints of Suicidal Currently, the patient is awaiting psychiatric placement.  Physical Exam  BP (!) 177/106   Pulse (!) 101   Temp 98.1 F (36.7 C) (Oral)   Resp 18   Ht 5\' 5"  (1.651 m)   Wt 117.9 kg   SpO2 99%   BMI 43.27 kg/m  Physical Exam General: Appears to be resting comfortably in bed, no acute distress. Cardiac: Regular rate, normal heart rate, non-emergent blood pressure for this morning's vitals. Lungs: No increased work of breathing.  Equal chest rise appreciated Psych: Calm, asleep in bed.   ED Course / MDM  EKG:EKG Interpretation  Date/Time:  Friday September 11 2022 11:33:44 EDT Ventricular Rate:  98 PR Interval:  167 QRS Duration: 96 QT Interval:  376 QTC Calculation: 481 R Axis:   81 Text Interpretation: Sinus rhythm Consider right atrial enlargement ST elev, probable normal early repol pattern When compared with ECG of 05/30/2021, No significant change was found Confirmed by Delora Fuel ([id number]) on 09/12/2022 2:00:35 AM  I have reviewed the labs performed to date as well as medications administered while in observation.   Plan  Current plan is for inpatient psychiatric placement.  Pending identification of available bed.    Tretha Sciara, MD 09/12/22 [phone]

## 2023-07-05 ENCOUNTER — Encounter (HOSPITAL_COMMUNITY): Payer: Self-pay

## 2023-07-05 ENCOUNTER — Other Ambulatory Visit: Payer: Self-pay

## 2023-07-05 ENCOUNTER — Emergency Department (HOSPITAL_COMMUNITY)
Admission: EM | Admit: 2023-07-05 | Discharge: 2023-07-06 | Discharge status: Against Medical Advice | Payer: MEDICAID | Attending: Emergency Medicine | Admitting: Emergency Medicine

## 2023-07-05 ENCOUNTER — Emergency Department (HOSPITAL_COMMUNITY): Payer: MEDICAID

## 2023-07-05 DIAGNOSIS — R1110 Vomiting, unspecified: Secondary | ICD-10-CM | POA: Diagnosis not present

## 2023-07-05 DIAGNOSIS — Z5321 Procedure and treatment not carried out due to patient leaving prior to being seen by health care provider: Secondary | ICD-10-CM | POA: Insufficient documentation

## 2023-07-05 DIAGNOSIS — R1030 Lower abdominal pain, unspecified: Secondary | ICD-10-CM | POA: Insufficient documentation

## 2023-07-05 LAB — CBC WITH DIFFERENTIAL/PLATELET
Abs Immature Granulocytes: 0.1 10*3/uL — ABNORMAL HIGH (ref 0.00–0.07)
Basophils Absolute: 0 10*3/uL (ref 0.0–0.1)
Basophils Relative: 0 %
Eosinophils Absolute: 0 10*3/uL (ref 0.0–0.5)
Eosinophils Relative: 0 %
HCT: 48.6 % — ABNORMAL HIGH (ref 36.0–46.0)
Hemoglobin: 15.7 g/dL — ABNORMAL HIGH (ref 12.0–15.0)
Immature Granulocytes: 1 %
Lymphocytes Relative: 16 %
Lymphs Abs: 2.9 10*3/uL (ref 0.7–4.0)
MCH: 27.4 pg (ref 26.0–34.0)
MCHC: 32.3 g/dL (ref 30.0–36.0)
MCV: 85 fL (ref 80.0–100.0)
Monocytes Absolute: 1.3 10*3/uL — ABNORMAL HIGH (ref 0.1–1.0)
Monocytes Relative: 7 %
Neutro Abs: 13.8 10*3/uL — ABNORMAL HIGH (ref 1.7–7.7)
Neutrophils Relative %: 76 %
Platelets: 474 10*3/uL — ABNORMAL HIGH (ref 150–400)
RBC: 5.72 MIL/uL — ABNORMAL HIGH (ref 3.87–5.11)
RDW: 14.8 % (ref 11.5–15.5)
WBC: 18.1 10*3/uL — ABNORMAL HIGH (ref 4.0–10.5)
nRBC: 0 % (ref 0.0–0.2)

## 2023-07-05 LAB — COMPREHENSIVE METABOLIC PANEL
ALT: 15 U/L (ref 0–44)
AST: 12 U/L — ABNORMAL LOW (ref 15–41)
Albumin: 2.8 g/dL — ABNORMAL LOW (ref 3.5–5.0)
Alkaline Phosphatase: 54 U/L (ref 38–126)
Anion gap: 12 (ref 5–15)
BUN: 17 mg/dL (ref 6–20)
CO2: 23 mmol/L (ref 22–32)
Calcium: 8.3 mg/dL — ABNORMAL LOW (ref 8.9–10.3)
Chloride: 100 mmol/L (ref 98–111)
Creatinine, Ser: 0.79 mg/dL (ref 0.44–1.00)
GFR, Estimated: >60 mL/min (ref >60)
Glucose, Bld: 128 mg/dL — ABNORMAL HIGH (ref 70–99)
Potassium: 3.7 mmol/L (ref 3.5–5.1)
Sodium: 135 mmol/L (ref 135–145)
Total Bilirubin: 0.5 mg/dL (ref 0.3–1.2)
Total Protein: 6.6 g/dL (ref 6.5–8.1)

## 2023-07-05 LAB — HCG, SERUM, QUALITATIVE: Preg, Serum: NEGATIVE

## 2023-07-05 NOTE — ED Triage Notes (Signed)
Pt arrived from home via POV c/o lower abd pain. Pt just discharged form [facility] with ovarian/tubal abscesses. Pt states pain 10/10. Abd is distended. Pt unable to keep anything down, multiple emesis episodes.

## 2023-07-06 LAB — I-STAT CG4 LACTIC ACID, ED: Lactic Acid, Venous: 1.5 mmol/L (ref 0.5–1.9)

## 2023-07-06 NOTE — ED Notes (Signed)
Pt name called for updated vital signs, no response.

## 2024-04-15 ENCOUNTER — Inpatient Hospital Stay (HOSPITAL_COMMUNITY): Payer: MEDICAID

## 2024-04-15 ENCOUNTER — Encounter (HOSPITAL_COMMUNITY): Payer: Self-pay | Admitting: *Deleted

## 2024-04-15 ENCOUNTER — Inpatient Hospital Stay (HOSPITAL_COMMUNITY)
Admission: EM | Admit: 2024-04-15 | Discharge: 2024-04-26 | DRG: 871 | Disposition: A | Discharge status: Home / Self Care | Payer: MEDICAID | Attending: Internal Medicine | Admitting: Internal Medicine

## 2024-04-15 ENCOUNTER — Emergency Department (HOSPITAL_COMMUNITY): Payer: MEDICAID

## 2024-04-15 ENCOUNTER — Other Ambulatory Visit: Payer: Self-pay

## 2024-04-15 DIAGNOSIS — E538 Deficiency of other specified B group vitamins: Secondary | ICD-10-CM | POA: Diagnosis present

## 2024-04-15 DIAGNOSIS — D75839 Thrombocytosis, unspecified: Secondary | ICD-10-CM | POA: Diagnosis present

## 2024-04-15 DIAGNOSIS — F17210 Nicotine dependence, cigarettes, uncomplicated: Secondary | ICD-10-CM | POA: Diagnosis present

## 2024-04-15 DIAGNOSIS — Z6841 Body Mass Index (BMI) 40.0 and over, adult: Secondary | ICD-10-CM

## 2024-04-15 DIAGNOSIS — Y95 Nosocomial condition: Secondary | ICD-10-CM | POA: Diagnosis present

## 2024-04-15 DIAGNOSIS — J90 Pleural effusion, not elsewhere classified: Secondary | ICD-10-CM | POA: Diagnosis present

## 2024-04-15 DIAGNOSIS — Z79899 Other long term (current) drug therapy: Secondary | ICD-10-CM | POA: Diagnosis not present

## 2024-04-15 DIAGNOSIS — K863 Pseudocyst of pancreas: Secondary | ICD-10-CM | POA: Diagnosis present

## 2024-04-15 DIAGNOSIS — E282 Polycystic ovarian syndrome: Secondary | ICD-10-CM | POA: Diagnosis present

## 2024-04-15 DIAGNOSIS — Z8701 Personal history of pneumonia (recurrent): Secondary | ICD-10-CM | POA: Diagnosis not present

## 2024-04-15 DIAGNOSIS — A419 Sepsis, unspecified organism: Principal | ICD-10-CM | POA: Diagnosis present

## 2024-04-15 DIAGNOSIS — I10 Essential (primary) hypertension: Secondary | ICD-10-CM | POA: Diagnosis present

## 2024-04-15 DIAGNOSIS — E119 Type 2 diabetes mellitus without complications: Secondary | ICD-10-CM | POA: Diagnosis present

## 2024-04-15 DIAGNOSIS — Z7984 Long term (current) use of oral hypoglycemic drugs: Secondary | ICD-10-CM | POA: Diagnosis not present

## 2024-04-15 DIAGNOSIS — D649 Anemia, unspecified: Secondary | ICD-10-CM | POA: Diagnosis not present

## 2024-04-15 DIAGNOSIS — F17200 Nicotine dependence, unspecified, uncomplicated: Secondary | ICD-10-CM | POA: Diagnosis not present

## 2024-04-15 DIAGNOSIS — Z5982 Transportation insecurity: Secondary | ICD-10-CM

## 2024-04-15 DIAGNOSIS — J189 Pneumonia, unspecified organism: Secondary | ICD-10-CM | POA: Diagnosis present

## 2024-04-15 DIAGNOSIS — E66813 Obesity, class 3: Secondary | ICD-10-CM | POA: Diagnosis present

## 2024-04-15 DIAGNOSIS — R1012 Left upper quadrant pain: Secondary | ICD-10-CM

## 2024-04-15 DIAGNOSIS — Z9049 Acquired absence of other specified parts of digestive tract: Secondary | ICD-10-CM

## 2024-04-15 DIAGNOSIS — B372 Candidiasis of skin and nail: Secondary | ICD-10-CM | POA: Diagnosis not present

## 2024-04-15 DIAGNOSIS — K8500 Idiopathic acute pancreatitis without necrosis or infection: Secondary | ICD-10-CM | POA: Diagnosis not present

## 2024-04-15 DIAGNOSIS — Z975 Presence of (intrauterine) contraceptive device: Secondary | ICD-10-CM | POA: Diagnosis not present

## 2024-04-15 DIAGNOSIS — A403 Sepsis due to Streptococcus pneumoniae: Secondary | ICD-10-CM | POA: Diagnosis not present

## 2024-04-15 DIAGNOSIS — K651 Peritoneal abscess: Secondary | ICD-10-CM | POA: Diagnosis present

## 2024-04-15 DIAGNOSIS — E8809 Other disorders of plasma-protein metabolism, not elsewhere classified: Secondary | ICD-10-CM | POA: Diagnosis not present

## 2024-04-15 DIAGNOSIS — Z981 Arthrodesis status: Secondary | ICD-10-CM

## 2024-04-15 DIAGNOSIS — R634 Abnormal weight loss: Secondary | ICD-10-CM

## 2024-04-15 DIAGNOSIS — R110 Nausea: Secondary | ICD-10-CM

## 2024-04-15 DIAGNOSIS — Z716 Tobacco abuse counseling: Secondary | ICD-10-CM

## 2024-04-15 DIAGNOSIS — R112 Nausea with vomiting, unspecified: Secondary | ICD-10-CM

## 2024-04-15 DIAGNOSIS — M170 Bilateral primary osteoarthritis of knee: Secondary | ICD-10-CM | POA: Diagnosis present

## 2024-04-15 DIAGNOSIS — B3731 Acute candidiasis of vulva and vagina: Secondary | ICD-10-CM | POA: Diagnosis not present

## 2024-04-15 DIAGNOSIS — K859 Acute pancreatitis without necrosis or infection, unspecified: Secondary | ICD-10-CM | POA: Diagnosis present

## 2024-04-15 DIAGNOSIS — R630 Anorexia: Secondary | ICD-10-CM

## 2024-04-15 DIAGNOSIS — R1010 Upper abdominal pain, unspecified: Secondary | ICD-10-CM

## 2024-04-15 DIAGNOSIS — E43 Unspecified severe protein-calorie malnutrition: Secondary | ICD-10-CM | POA: Diagnosis present

## 2024-04-15 DIAGNOSIS — D72829 Elevated white blood cell count, unspecified: Secondary | ICD-10-CM | POA: Diagnosis not present

## 2024-04-15 DIAGNOSIS — M160 Bilateral primary osteoarthritis of hip: Secondary | ICD-10-CM | POA: Diagnosis present

## 2024-04-15 LAB — URINALYSIS, ROUTINE W REFLEX MICROSCOPIC
Bilirubin Urine: NEGATIVE
Glucose, UA: NEGATIVE mg/dL
Ketones, ur: NEGATIVE mg/dL
Leukocytes,Ua: NEGATIVE
Nitrite: NEGATIVE
Protein, ur: NEGATIVE mg/dL
Specific Gravity, Urine: 1.009 (ref 1.005–1.030)
pH: 6 (ref 5.0–8.0)

## 2024-04-15 LAB — COMPREHENSIVE METABOLIC PANEL WITH GFR
ALT: 26 U/L (ref 0–44)
AST: 23 U/L (ref 15–41)
Albumin: 2.4 g/dL — ABNORMAL LOW (ref 3.5–5.0)
Alkaline Phosphatase: 83 U/L (ref 38–126)
Anion gap: 13 (ref 5–15)
BUN: 13 mg/dL (ref 6–20)
CO2: 22 mmol/L (ref 22–32)
Calcium: 9.1 mg/dL (ref 8.9–10.3)
Chloride: 101 mmol/L (ref 98–111)
Creatinine, Ser: 0.92 mg/dL (ref 0.44–1.00)
GFR, Estimated: >60 mL/min (ref >60)
Glucose, Bld: 106 mg/dL — ABNORMAL HIGH (ref 70–99)
Potassium: 3.4 mmol/L — ABNORMAL LOW (ref 3.5–5.1)
Sodium: 136 mmol/L (ref 135–145)
Total Bilirubin: 0.5 mg/dL (ref 0.0–1.2)
Total Protein: 6.7 g/dL (ref 6.5–8.1)

## 2024-04-15 LAB — CBC
HCT: 39.5 % (ref 36.0–46.0)
Hemoglobin: 12.8 g/dL (ref 12.0–15.0)
MCH: 27.4 pg (ref 26.0–34.0)
MCHC: 32.4 g/dL (ref 30.0–36.0)
MCV: 84.6 fL (ref 80.0–100.0)
Platelets: 566 10*3/uL — ABNORMAL HIGH (ref 150–400)
RBC: 4.67 MIL/uL (ref 3.87–5.11)
RDW: 13.6 % (ref 11.5–15.5)
WBC: 19.6 10*3/uL — ABNORMAL HIGH (ref 4.0–10.5)
nRBC: 0 % (ref 0.0–0.2)

## 2024-04-15 LAB — PROCALCITONIN: Procalcitonin: 0.75 ng/mL

## 2024-04-15 LAB — HCG, SERUM, QUALITATIVE: Preg, Serum: NEGATIVE

## 2024-04-15 LAB — TROPONIN I (HIGH SENSITIVITY)
Troponin I (High Sensitivity): 10 ng/L (ref <18)
Troponin I (High Sensitivity): 21 ng/L — ABNORMAL HIGH (ref <18)

## 2024-04-15 LAB — LIPASE, BLOOD: Lipase: 146 U/L — ABNORMAL HIGH (ref 11–51)

## 2024-04-15 MED ORDER — ONDANSETRON HCL 4 MG/2ML IJ SOLN
4.0000 mg | Freq: Four times a day (QID) | INTRAMUSCULAR | Status: DC | PRN
  Administered 2024-04-16 – 2024-04-20 (×6): 4 mg via INTRAVENOUS
  Filled 2024-04-15 (×6): qty 2

## 2024-04-15 MED ORDER — SODIUM CHLORIDE 0.9 % IV SOLN
2.0000 g | Freq: Once | INTRAVENOUS | Status: AC
  Administered 2024-04-15: 2 g via INTRAVENOUS
  Filled 2024-04-15: qty 12.5

## 2024-04-15 MED ORDER — LACTATED RINGERS IV BOLUS
1000.0000 mL | Freq: Once | INTRAVENOUS | Status: AC
  Administered 2024-04-15: 1000 mL via INTRAVENOUS

## 2024-04-15 MED ORDER — LISINOPRIL 20 MG PO TABS
20.0000 mg | ORAL_TABLET | Freq: Every day | ORAL | Status: DC
  Administered 2024-04-15 – 2024-04-19 (×5): 20 mg via ORAL
  Filled 2024-04-15 (×6): qty 1

## 2024-04-15 MED ORDER — ACETAMINOPHEN 650 MG RE SUPP: 650.0000 mg | Freq: Four times a day (QID) | RECTAL | Status: DC | PRN

## 2024-04-15 MED ORDER — NICOTINE 21 MG/24HR TD PT24
21.0000 mg | MEDICATED_PATCH | Freq: Every day | TRANSDERMAL | Status: DC
  Administered 2024-04-15 – 2024-04-26 (×11): 21 mg via TRANSDERMAL
  Filled 2024-04-15 (×11): qty 1

## 2024-04-15 MED ORDER — POTASSIUM CHLORIDE CRYS ER 20 MEQ PO TBCR
40.0000 meq | EXTENDED_RELEASE_TABLET | Freq: Once | ORAL | Status: AC
  Administered 2024-04-15: 40 meq via ORAL
  Filled 2024-04-15: qty 2

## 2024-04-15 MED ORDER — HYDROMORPHONE HCL 1 MG/ML IJ SOLN
0.5000 mg | Freq: Once | INTRAMUSCULAR | Status: AC
  Administered 2024-04-15: 0.5 mg via INTRAVENOUS
  Filled 2024-04-15: qty 1

## 2024-04-15 MED ORDER — AMLODIPINE BESYLATE 10 MG PO TABS
10.0000 mg | ORAL_TABLET | Freq: Every morning | ORAL | Status: DC
  Administered 2024-04-16 – 2024-04-19 (×4): 10 mg via ORAL
  Filled 2024-04-15 (×5): qty 1

## 2024-04-15 MED ORDER — ENOXAPARIN SODIUM 40 MG/0.4ML IJ SOSY
40.0000 mg | PREFILLED_SYRINGE | INTRAMUSCULAR | Status: DC
  Administered 2024-04-15 – 2024-04-25 (×10): 40 mg via SUBCUTANEOUS
  Filled 2024-04-15 (×11): qty 0.4

## 2024-04-15 MED ORDER — IOHEXOL 350 MG/ML SOLN
75.0000 mL | Freq: Once | INTRAVENOUS | Status: AC | PRN
Start: 2024-04-15 — End: 2024-04-15
  Administered 2024-04-15: 75 mL via INTRAVENOUS

## 2024-04-15 MED ORDER — VANCOMYCIN HCL 1750 MG/350ML IV SOLN
1750.0000 mg | Freq: Once | INTRAVENOUS | Status: AC
  Administered 2024-04-15: 1750 mg via INTRAVENOUS
  Filled 2024-04-15: qty 350

## 2024-04-15 MED ORDER — METFORMIN HCL 500 MG PO TABS
500.0000 mg | ORAL_TABLET | Freq: Two times a day (BID) | ORAL | Status: DC
  Administered 2024-04-16 – 2024-04-26 (×20): 500 mg via ORAL
  Filled 2024-04-15 (×20): qty 1

## 2024-04-15 MED ORDER — OXYCODONE-ACETAMINOPHEN 5-325 MG PO TABS
1.0000 | ORAL_TABLET | Freq: Once | ORAL | Status: AC
  Administered 2024-04-15: 1 via ORAL
  Filled 2024-04-15: qty 1

## 2024-04-15 MED ORDER — POTASSIUM CHLORIDE CRYS ER 10 MEQ PO TBCR: 10.0000 meq | EXTENDED_RELEASE_TABLET | Freq: Every day | ORAL | Status: DC

## 2024-04-15 MED ORDER — KETOROLAC TROMETHAMINE 15 MG/ML IJ SOLN
15.0000 mg | Freq: Once | INTRAMUSCULAR | Status: AC
  Administered 2024-04-15: 15 mg via INTRAVENOUS
  Filled 2024-04-15: qty 1

## 2024-04-15 MED ORDER — HYDROCHLOROTHIAZIDE 12.5 MG PO TABS
12.5000 mg | ORAL_TABLET | Freq: Every day | ORAL | Status: DC
  Administered 2024-04-15 – 2024-04-19 (×5): 12.5 mg via ORAL
  Filled 2024-04-15 (×5): qty 1

## 2024-04-15 MED ORDER — HYDROMORPHONE HCL 1 MG/ML IJ SOLN
0.5000 mg | Freq: Four times a day (QID) | INTRAMUSCULAR | Status: DC | PRN
  Administered 2024-04-15 – 2024-04-19 (×10): 0.5 mg via INTRAVENOUS
  Filled 2024-04-15 (×11): qty 0.5

## 2024-04-15 MED ORDER — ONDANSETRON HCL 4 MG PO TABS
4.0000 mg | ORAL_TABLET | Freq: Four times a day (QID) | ORAL | Status: DC | PRN
  Administered 2024-04-16 – 2024-04-25 (×3): 4 mg via ORAL
  Filled 2024-04-15 (×3): qty 1

## 2024-04-15 MED ORDER — METHOCARBAMOL 500 MG PO TABS
500.0000 mg | ORAL_TABLET | Freq: Four times a day (QID) | ORAL | Status: DC | PRN
  Administered 2024-04-16 – 2024-04-24 (×10): 500 mg via ORAL
  Filled 2024-04-15 (×10): qty 1

## 2024-04-15 MED ORDER — ACETAMINOPHEN 325 MG PO TABS
650.0000 mg | ORAL_TABLET | Freq: Four times a day (QID) | ORAL | Status: DC | PRN
  Administered 2024-04-16: 650 mg via ORAL
  Filled 2024-04-15: qty 2

## 2024-04-15 MED ORDER — SENNOSIDES-DOCUSATE SODIUM 8.6-50 MG PO TABS
1.0000 | ORAL_TABLET | Freq: Every evening | ORAL | Status: DC | PRN
  Administered 2024-04-23: 1 via ORAL
  Filled 2024-04-15: qty 1

## 2024-04-15 NOTE — ED Notes (Signed)
 Patient transported to X-ray

## 2024-04-15 NOTE — ED Triage Notes (Signed)
 The pt reports that she is sob and has some back pain

## 2024-04-15 NOTE — ED Notes (Signed)
 ED TO INPATIENT HANDOFF REPORT  ED Nurse Name and Phone #: Nerissa Bannister [phone]  S Name/Age/Gender Katrina Lambert 39 y.o. female Room/Bed: 009C/009C  Code Status   Code Status: Prior  Home/SNF/Other Home Patient oriented to: self, place, time, and situation Is this baseline? Yes   Triage Complete: Triage complete  Chief Complaint Sepsis due to pneumonia (HCC) [J18.9, A41.9]  Triage Note Diagnosed with pneumonia at [facility] [place] all of the entire month of may.  She was seen at urgent care this am  and she was told to come to the ed  The pt reports that she is sob and has some back pain   Allergies No Known Allergies  Level of Care/Admitting Diagnosis ED Disposition     ED Disposition  Admit   Condition  --   Comment  Hospital [facility] [[id number]]  Level of Care: Telemetry Medical [104]  May admit patient to [facility] Benes or [facility] Smalling if equivalent level of care is available:: No  Covid Evaluation: Asymptomatic - no recent exposure (last 10 days) testing not required  Diagnosis: Sepsis due to pneumonia [facility]) [[id number]]  Admitting Physician: Vita Grip [[id number]]  Attending Physician: Vita Grip [[id number]]  Certification:: I certify this patient will need inpatient services for at least 2 midnights  Expected Medical Readiness: 04/17/2024          B Medical/Surgery History Past Medical History:  Diagnosis Date   Anxiety    Arthritis    bilateral hips and knees   Asthma    Bipolar disorder (HCC)    Depression    Dyspnea    GERD (gastroesophageal reflux disease)    Headache    Hypertension    Neuromuscular disorder (HCC)    PONV (postoperative nausea and vomiting)    Pre-diabetes    Past Surgical History:  Procedure Laterality Date   ANTERIOR CERVICAL DECOMP/DISCECTOMY FUSION N/A 06/02/2021   Procedure: ANTERIOR CERVICAL DISCECTOMY FUSION CERVICAL FOUR-FIVE, CERVICAL FIVE-SIX, CERVICAL SIX-SEVEN WITH CERVICAL SIX  CORPECTOMY;  Surgeon: Dawley, Colby Daub, DO;  Location: [facility] OR;  Service: Neurosurgery;  Laterality: N/A;   CESAREAN SECTION     x 2   CHOLECYSTECTOMY     POSTERIOR CERVICAL FUSION/FORAMINOTOMY N/A 06/06/2021   Procedure: CERVICAL FOUR-FIVE, CERVICAL FIVE-SIX, CERVICAL SIX-SEVEN, CERVICAL SEVEN-THORACIC ONE POSTERIOR CERVICAL INSTRUMENTATION AND FUSION WITH CERVICAL FOUR-CERVICAL SEVEN LAMINECTOMY/DECOMPRESSION;  Surgeon: Dawley, Colby Daub, DO;  Location: [facility] OR;  Service: Neurosurgery;  Laterality: N/A;     A IV Location/Drains/Wounds Patient Lines/Drains/Airways Status     Active Line/Drains/Airways     Name Placement date Placement time Site Days   Peripheral IV 04/15/24 20 G 1" Left Antecubital 04/15/24  1930  Antecubital  less than 1   Pressure Injury 06/06/21 Buttocks Medial Stage 1 -  Intact skin with non-blanchable redness of a localized area usually over a bony prominence. Red 06/06/21  2030  -- 1044            Intake/Output Last 24 hours No intake or output data in the 24 hours ending 04/15/24 2044  Labs/Imaging Results for orders placed or performed during the hospital encounter of 04/15/24 (from the past 48 hours)  Lipase, blood     Status: Abnormal   Collection Time: 04/15/24  5:31 PM  Result Value Ref Range   Lipase 146 (H) 11 - 51 U/L    Comment: Performed at [facility], [address] [place], [place] [address]  Comprehensive  metabolic panel     Status: Abnormal   Collection Time: 04/15/24  5:31 PM  Result Value Ref Range   Sodium 136 135 - 145 mmol/L   Potassium 3.4 (L) 3.5 - 5.1 mmol/L   Chloride 101 98 - 111 mmol/L   CO2 22 22 - 32 mmol/L   Glucose, Bld 106 (H) 70 - 99 mg/dL    Comment: Glucose reference range applies only to samples taken after fasting for at least 8 hours.   BUN 13 6 - 20 mg/dL   Creatinine, Ser 1.61 0.44 - 1.00 mg/dL   Calcium 9.1 8.9 - 09.6 mg/dL   Total Protein 6.7 6.5 - 8.1 g/dL   Albumin 2.4 (L) 3.5 - 5.0 g/dL   AST 23 15 - 41  U/L   ALT 26 0 - 44 U/L   Alkaline Phosphatase 83 38 - 126 U/L   Total Bilirubin 0.5 0.0 - 1.2 mg/dL   GFR, Estimated >04 >54 mL/min    Comment: (NOTE) Calculated using the CKD-EPI Creatinine Equation (2021)    Anion gap 13 5 - 15    Comment: Performed at [facility], [address] [place], [place] [address]  CBC     Status: Abnormal   Collection Time: 04/15/24  5:31 PM  Result Value Ref Range   WBC 19.6 (H) 4.0 - 10.5 K/uL   RBC 4.67 3.87 - 5.11 MIL/uL   Hemoglobin 12.8 12.0 - 15.0 g/dL   HCT 91.4 78.2 - 95.6 %   MCV 84.6 80.0 - 100.0 fL   MCH 27.4 26.0 - 34.0 pg   MCHC 32.4 30.0 - 36.0 g/dL   RDW 21.3 08.6 - 57.8 %   Platelets 566 (H) 150 - 400 K/uL   nRBC 0.0 0.0 - 0.2 %    Comment: Performed at [facility], [address], [place], [place] [address]  Urinalysis, Routine w reflex microscopic -Urine, Clean Catch     Status: Abnormal   Collection Time: 04/15/24  5:31 PM  Result Value Ref Range   Color, Urine YELLOW YELLOW   APPearance CLEAR CLEAR   Specific Gravity, Urine 1.009 1.005 - 1.030   pH 6.0 5.0 - 8.0   Glucose, UA NEGATIVE NEGATIVE mg/dL   Hgb urine dipstick MODERATE (A) NEGATIVE   Bilirubin Urine NEGATIVE NEGATIVE   Ketones, ur NEGATIVE NEGATIVE mg/dL   Protein, ur NEGATIVE NEGATIVE mg/dL   Nitrite NEGATIVE NEGATIVE   Leukocytes,Ua NEGATIVE NEGATIVE   RBC / HPF 6-10 0 - 5 RBC/hpf   WBC, UA 0-5 0 - 5 WBC/hpf   Bacteria, UA RARE (A) NONE SEEN   Squamous Epithelial / HPF 0-5 0 - 5 /HPF    Comment: Performed at [facility], [address], [place], [place] [address]  hCG, serum, qualitative     Status: None   Collection Time: 04/15/24  5:31 PM  Result Value Ref Range   Preg, Serum NEGATIVE NEGATIVE    Comment:        THE SENSITIVITY OF THIS METHODOLOGY IS >10 mIU/mL. Performed at [facility], [address], [place], [place] [address]   Troponin I (High Sensitivity)     Status: Abnormal   Collection Time: 04/15/24  7:35 PM   Result Value Ref Range   Troponin I (High Sensitivity) 21 (H) <18 ng/L    Comment: (NOTE) Elevated high sensitivity troponin I (hsTnI) values and significant  changes across serial measurements may suggest ACS  but many other  chronic and acute conditions are known to elevate hsTnI results.  Refer to the "Links" section for chest pain algorithms and additional  guidance. Performed at [facility], [address], [place], [place] [address]    DG Chest 2 View Result Date: 04/15/2024 CLINICAL DATA:  Cough. Left back pain. Shortness of breath. Pneumonia diagnosis 1 month ago. EXAM: CHEST - 2 VIEW COMPARISON:  07/05/2023 FINDINGS: Shallow inspiration. Heart size and pulmonary vascularity are normal for technique. There is a moderate left pleural effusion with basilar atelectasis or infiltration, possibly indicating pneumonia. Right lung is clear. No pneumothorax. Mediastinal contours appear intact. Postoperative changes in the cervical spine. Inferior pedicular screws appear fractured, unchanged since prior study. Surgical clips in the right upper quadrant. IMPRESSION: Moderate left pleural effusion with basilar atelectasis or infiltration, possibly pneumonia. Electronically Signed   By: Boyce Byes M.D.   On: 04/15/2024 19:15    Pending Labs Unresulted Labs (From admission, onward)     Start     Ordered   04/15/24 1947  Blood culture (routine x 2)  BLOOD CULTURE X 2,   R (with STAT occurrences)      04/15/24 1946            Vitals/Pain Today's Vitals   04/15/24 1815 04/15/24 1830 04/15/24 1845 04/15/24 1931  BP: (!) 154/89 (!) 140/95 (!) 139/91 124/81  Pulse: (!) 104   100  Resp: (!) 26 (!) 22 20 20   Temp:      SpO2: 96%   96%  Weight:      Height:      PainSc:    7     Isolation Precautions No active isolations  Medications Medications  ceFEPIme (MAXIPIME) 2 g in sodium chloride  0.9 % 100 mL IVPB (2 g Intravenous New Bag/Given 04/15/24 2024)  vancomycin   (VANCOREADY) IVPB 1750 mg/350 mL (has no administration in time range)  oxyCODONE -acetaminophen  (PERCOCET/ROXICET) 5-325 MG per tablet 1 tablet (1 tablet Oral Given 04/15/24 1735)  ketorolac  (TORADOL ) 15 MG/ML injection 15 mg (15 mg Intravenous Given 04/15/24 1931)  HYDROmorphone  (DILAUDID ) injection 0.5 mg (0.5 mg Intravenous Given 04/15/24 1931)  lactated ringers  bolus 1,000 mL (1,000 mLs Intravenous New Bag/Given 04/15/24 1951)    Mobility walks     Focused Assessments Pulmonary Assessment Handoff:  Lung sounds:   O2 Device: Room Air      R Recommendations: See Admitting Provider Note  Report given to:   Additional Notes:

## 2024-04-15 NOTE — Progress Notes (Signed)
 ED Pharmacy Antibiotic Sign Off An antibiotic consult was received from an ED provider for vancomycin  and cefepime per pharmacy dosing for sepsis. A chart review was completed to assess appropriateness.   The following one time order(s) were placed:  Vancomycin  1750 mg IV x 1 Cefepime 2g IV x 1  Further antibiotic and/or antibiotic pharmacy consults should be ordered by the admitting provider if indicated.   Thank you for allowing pharmacy to be a part of this patient's care.   Trinidad Funk, [facility]  Clinical Pharmacist 04/15/24 7:47 PM

## 2024-04-15 NOTE — H&P (Signed)
 History and Physical  Katrina Lambert ZOX:[id number] DOB: Sep 04, 1986 DOA: 04/15/2024  PCP: Aurelio Blower, [facility]   Chief Complaint: Shortness of breath, cough  HPI: Katrina Lambert is a 38 y.o. female with medical history significant for anxiety and depression, bipolar disorder, GERD, prediabetes, cervical myelopathy who presented to the ED for evaluation of shortness of breath, cough and back pain.  ED Course: Initial vitals showed temp 98.6, RR 24, HR 114, BP 158/97, SpO2 95% on room air. Initial vitals significant for WBC 19.6, Hgb 12.8, platelet 566,K+ 3.4, normal creatinine, albumin 2.4, lipase of 46, negative UA and pregnancy test. Chest x-ray shows normal sinus. CXR shows moderate left pleural effusion with bibasilar atelectasis or infiltrate, possible pneumonia. Sepsis protocol was activated and patient received IV LR 1 L bolus, IV vancomycin , IV cefepime, IV Dilaudid , IV Toradol  and Percocet 5-325 mg x 1. TRH was consulted for admission.  Review of Systems: Please see HPI for pertinent positives and negatives. A complete 10 system review of systems are otherwise negative.  Past Medical History:  Diagnosis Date   Anxiety    Arthritis    bilateral hips and knees   Asthma    Bipolar disorder (HCC)    Depression    Dyspnea    GERD (gastroesophageal reflux disease)    Headache    Hypertension    Neuromuscular disorder (HCC)    PONV (postoperative nausea and vomiting)    Pre-diabetes    Past Surgical History:  Procedure Laterality Date   ANTERIOR CERVICAL DECOMP/DISCECTOMY FUSION N/A 06/02/2021   Procedure: ANTERIOR CERVICAL DISCECTOMY FUSION CERVICAL FOUR-FIVE, CERVICAL FIVE-SIX, CERVICAL SIX-SEVEN WITH CERVICAL SIX CORPECTOMY;  Surgeon: Dawley, Colby Daub, DO;  Location: [facility] OR;  Service: Neurosurgery;  Laterality: N/A;   CESAREAN SECTION     x 2   CHOLECYSTECTOMY     POSTERIOR CERVICAL FUSION/FORAMINOTOMY N/A 06/06/2021   Procedure: CERVICAL FOUR-FIVE, CERVICAL  FIVE-SIX, CERVICAL SIX-SEVEN, CERVICAL SEVEN-THORACIC ONE POSTERIOR CERVICAL INSTRUMENTATION AND FUSION WITH CERVICAL FOUR-CERVICAL SEVEN LAMINECTOMY/DECOMPRESSION;  Surgeon: Dawley, Colby Daub, DO;  Location: [facility] OR;  Service: Neurosurgery;  Laterality: N/A;   Social History:  reports that she has been smoking cigarettes. She has a 33 pack-year smoking history. She has quit using smokeless tobacco. She reports current alcohol use. She reports current drug use. Frequency: 7.00 times per week. Drug: Marijuana.  No Known Allergies  History reviewed. No pertinent family history.   Prior to Admission medications   Medication Sig Start Date End Date Taking? Authorizing Provider  amLODipine  (NORVASC ) 10 MG tablet Take 10 mg by mouth every morning. Patient not taking: Reported on 09/11/2022 01/06/21   [provider]  HYDROcodone -acetaminophen  (NORCO) 10-325 MG tablet Take 1 tablet by mouth every 4 (four) hours as needed for moderate pain ((score 4 to 6)). Patient not taking: Reported on 09/11/2022 06/09/21   Dawley, Rosalva Comber C, DO  lisinopril -hydrochlorothiazide  (ZESTORETIC ) 10-12.5 MG tablet Take 1 tablet by mouth daily. Patient not taking: Reported on 09/11/2022 01/06/21   [provider]  methocarbamol  (ROBAXIN ) 500 MG tablet Take 1 tablet (500 mg total) by mouth every 6 (six) hours as needed for muscle spasms. Patient not taking: Reported on 09/11/2022 06/09/21   Dawley, Rosalva Comber C, DO  metoprolol  succinate (TOPROL -XL) 50 MG 24 hr tablet Take 50 mg by mouth every morning. Patient not taking: Reported on 09/11/2022 01/09/21   [provider]  nicotine  (NICODERM CQ  - DOSED IN MG/24 HOURS) 21 mg/24hr patch Place 1 patch (21 mg total) onto  the skin daily. Patient not taking: Reported on 09/11/2022 06/09/21   Dawley, Colby Daub, DO    Physical Exam: BP 124/81   Pulse 100   Temp 98.6 F (37 C)   Resp 20   Ht 5\' 5"  (1.651 m)   Wt 118 kg   SpO2 96%   BMI 43.29 kg/m  General: Pleasant,  well-appearing *** laying in bed. No acute distress. HEENT: [place]/AT. Anicteric sclera CV: RRR. No murmurs, rubs, or gallops. No LE edema Pulmonary: Lungs CTAB. Normal effort. No wheezing or rales. Abdominal: Soft, nontender, nondistended. Normal bowel sounds. Extremities: Palpable radial and DP pulses. Normal ROM. Skin: Warm and dry. No obvious rash or lesions. Neuro: A&Ox3. Moves all extremities. Normal sensation to light touch. No focal deficit. Psych: Normal mood and affect          Labs on Admission:  Basic Metabolic Panel: Recent Labs  Lab 04/15/24 1731  NA 136  K 3.4*  CL 101  CO2 22  GLUCOSE 106*  BUN 13  CREATININE 0.92  CALCIUM 9.1   Liver Function Tests: Recent Labs  Lab 04/15/24 1731  AST 23  ALT 26  ALKPHOS 83  BILITOT 0.5  PROT 6.7  ALBUMIN 2.4*   Recent Labs  Lab 04/15/24 1731  LIPASE 146*   No results for input(s): "AMMONIA" in the last 168 hours. CBC: Recent Labs  Lab 04/15/24 1731  WBC 19.6*  HGB 12.8  HCT 39.5  MCV 84.6  PLT 566*   Cardiac Enzymes: No results for input(s): "CKTOTAL", "CKMB", "CKMBINDEX", "TROPONINI" in the last 168 hours. BNP (last 3 results) No results for input(s): "BNP" in the last 8760 hours.  ProBNP (last 3 results) No results for input(s): "PROBNP" in the last 8760 hours.  CBG: No results for input(s): "GLUCAP" in the last 168 hours.  Radiological Exams on Admission: DG Chest 2 View Result Date: 04/15/2024 CLINICAL DATA:  Cough. Left back pain. Shortness of breath. Pneumonia diagnosis 1 month ago. EXAM: CHEST - 2 VIEW COMPARISON:  07/05/2023 FINDINGS: Shallow inspiration. Heart size and pulmonary vascularity are normal for technique. There is a moderate left pleural effusion with basilar atelectasis or infiltration, possibly indicating pneumonia. Right lung is clear. No pneumothorax. Mediastinal contours appear intact. Postoperative changes in the cervical spine. Inferior pedicular screws appear fractured,  unchanged since prior study. Surgical clips in the right upper quadrant. IMPRESSION: Moderate left pleural effusion with basilar atelectasis or infiltration, possibly pneumonia. Electronically Signed   By: Boyce Byes M.D.   On: 04/15/2024 19:15   Assessment/Plan Katrina Lambert is a 38 y.o. female with medical history significant for ***   #***  #***  #***  #***  #***  #***  #***  DVT prophylaxis: Lovenox     Code Status: Prior  Consults called: ***  Family Communication: ***  Severity of Illness: {Observation/Inpatient:21159}  Level of care: Telemetry Medical   This record has been created using Conservation officer, historic buildings. Errors have been sought and corrected, but may not always be located. Such creation errors do not reflect on the standard of care.   Vita Grip, MD 04/15/2024, 8:31 PM [facility] Pager: [phone] Isaiah 41:10   If 7PM-7AM, please contact night-coverage [email or web address] Password [email or web address]

## 2024-04-15 NOTE — ED Notes (Signed)
 6N notified pt being transported after CT completed.

## 2024-04-15 NOTE — ED Notes (Signed)
 Chest xray at urgent care today earlier

## 2024-04-15 NOTE — ED Triage Notes (Signed)
 Diagnosed with pneumonia at [facility] in [place] all of the entire month of may.  She was seen at urgent care this am  and she was told to come to the ed

## 2024-04-15 NOTE — ED Provider Notes (Signed)
 [place] EMERGENCY DEPARTMENT AT [facility] Provider Note   CSN: [id number] Arrival date & time: 04/15/24  1616     History {Add pertinent medical, surgical, social history, OB history to HPI:1} Chief Complaint  Patient presents with  . Pneumonia    Katrina Lambert is a 38 y.o. female.  HPI 38 year old female presents with a chief complaint of left-sided back pain.  She was admitted to an outside hospital earlier this month for pancreatitis.  Had to go back and was found out she had pneumonia.  Was discharged about 2 weeks ago but has continued to have cough, left-sided back pain and shortness of breath.  She feels like her cough symptoms are getting worse and she is developing recurrent fevers over the last few days.  She completed of course of antibiotics.  Had a temperature of 101 earlier today.  Went to urgent care where they did an x-ray but she does not know the results and they sent her to the ER.  The pain is worse with breathing.  She is coughing up productive sputum.  She also has abdominal pain and occasional chest pain but the worst of the pain is in her left mid back.  Home Medications Prior to Admission medications   Medication Sig Start Date End Date Taking? Authorizing Provider  amLODipine  (NORVASC ) 10 MG tablet Take 10 mg by mouth every morning. Patient not taking: Reported on 09/11/2022 01/06/21   [provider]  HYDROcodone -acetaminophen  (NORCO) 10-325 MG tablet Take 1 tablet by mouth every 4 (four) hours as needed for moderate pain ((score 4 to 6)). Patient not taking: Reported on 09/11/2022 06/09/21   Dawley, Troy C, DO  lisinopril -hydrochlorothiazide  (ZESTORETIC ) 10-12.5 MG tablet Take 1 tablet by mouth daily. Patient not taking: Reported on 09/11/2022 01/06/21   [provider]  methocarbamol  (ROBAXIN ) 500 MG tablet Take 1 tablet (500 mg total) by mouth every 6 (six) hours as needed for muscle spasms. Patient not taking: Reported on  09/11/2022 06/09/21   Dawley, Rosalva Comber C, DO  metoprolol  succinate (TOPROL -XL) 50 MG 24 hr tablet Take 50 mg by mouth every morning. Patient not taking: Reported on 09/11/2022 01/09/21   [provider]  nicotine  (NICODERM CQ  - DOSED IN MG/24 HOURS) 21 mg/24hr patch Place 1 patch (21 mg total) onto the skin daily. Patient not taking: Reported on 09/11/2022 06/09/21   Dawley, Troy C, DO      Allergies    Patient has no known allergies.    Review of Systems   Review of Systems  Constitutional:  Positive for fever.  Respiratory:  Positive for cough and shortness of breath.   Cardiovascular:  Positive for chest pain.  Gastrointestinal:  Positive for abdominal pain and constipation.    Physical Exam Updated Vital Signs BP (!) 154/89   Pulse (!) 104   Temp 98.6 F (37 C)   Resp (!) 26   Ht 5\' 5"  (1.651 m)   Wt 118 kg   SpO2 96%   BMI 43.29 kg/m  Physical Exam Vitals and nursing note reviewed.  Constitutional:      General: She is not in acute distress.    Appearance: She is well-developed. She is obese. She is not ill-appearing or diaphoretic.  HENT:     Head: Normocephalic and atraumatic.  Cardiovascular:     Rate and Rhythm: Regular rhythm. Tachycardia present.     Heart sounds: Normal heart sounds.  Pulmonary:     Effort: Pulmonary effort  is normal. No tachypnea, accessory muscle usage or respiratory distress.     Breath sounds: Examination of the left-lower field reveals decreased breath sounds. Decreased breath sounds present. No wheezing.  Abdominal:     Palpations: Abdomen is soft.     Tenderness: There is no abdominal tenderness.  Musculoskeletal:       Back:  Skin:    General: Skin is warm and dry.  Neurological:     Mental Status: She is alert.    ED Results / Procedures / Treatments   Labs (all labs ordered are listed, but only abnormal results are displayed) Labs Reviewed  LIPASE, BLOOD - Abnormal; Notable for the following components:      Result  Value   Lipase 146 (*)    All other components within normal limits  COMPREHENSIVE METABOLIC PANEL WITH GFR - Abnormal; Notable for the following components:   Potassium 3.4 (*)    Glucose, Bld 106 (*)    Albumin 2.4 (*)    All other components within normal limits  CBC - Abnormal; Notable for the following components:   WBC 19.6 (*)    Platelets 566 (*)    All other components within normal limits  URINALYSIS, ROUTINE W REFLEX MICROSCOPIC - Abnormal; Notable for the following components:   Hgb urine dipstick MODERATE (*)    Bacteria, UA RARE (*)    All other components within normal limits  HCG, SERUM, QUALITATIVE  D-DIMER, QUANTITATIVE  TROPONIN I (HIGH SENSITIVITY)    EKG None  Radiology No results found.  Procedures Procedures  {Document cardiac monitor, telemetry assessment procedure when appropriate:1}  Medications Ordered in ED Medications  ketorolac  (TORADOL ) 15 MG/ML injection 15 mg (has no administration in time range)  HYDROmorphone  (DILAUDID ) injection 0.5 mg (has no administration in time range)  lactated ringers  bolus 1,000 mL (has no administration in time range)  oxyCODONE -acetaminophen  (PERCOCET/ROXICET) 5-325 MG per tablet 1 tablet (1 tablet Oral Given 04/15/24 1735)    ED Course/ Medical Decision Making/ A&P   {   Click here for ABCD2, HEART and other calculatorsREFRESH Note before signing :1}                              Medical Decision Making Amount and/or Complexity of Data Reviewed Labs: ordered. Radiology: ordered.  Risk Prescription drug management.   ***  {Document critical care time when appropriate:1} {Document review of labs and clinical decision tools ie heart score, Chads2Vasc2 etc:1}  {Document your independent review of radiology images, and any outside records:1} {Document your discussion with family members, caretakers, and with consultants:1} {Document social determinants of health affecting pt's care:1} {Document your  decision making why or why not admission, treatments were needed:1} Final Clinical Impression(s) / ED Diagnoses Final diagnoses:  None    Rx / DC Orders ED Discharge Orders     None

## 2024-04-15 NOTE — ED Notes (Signed)
 Dr. Darcia Easter at bedside.

## 2024-04-16 DIAGNOSIS — K859 Acute pancreatitis without necrosis or infection, unspecified: Secondary | ICD-10-CM | POA: Diagnosis not present

## 2024-04-16 DIAGNOSIS — R1012 Left upper quadrant pain: Secondary | ICD-10-CM

## 2024-04-16 DIAGNOSIS — K651 Peritoneal abscess: Secondary | ICD-10-CM

## 2024-04-16 DIAGNOSIS — A419 Sepsis, unspecified organism: Secondary | ICD-10-CM | POA: Diagnosis not present

## 2024-04-16 DIAGNOSIS — D75839 Thrombocytosis, unspecified: Secondary | ICD-10-CM

## 2024-04-16 DIAGNOSIS — D649 Anemia, unspecified: Secondary | ICD-10-CM

## 2024-04-16 DIAGNOSIS — R110 Nausea: Secondary | ICD-10-CM

## 2024-04-16 DIAGNOSIS — A403 Sepsis due to Streptococcus pneumoniae: Secondary | ICD-10-CM

## 2024-04-16 DIAGNOSIS — R112 Nausea with vomiting, unspecified: Secondary | ICD-10-CM

## 2024-04-16 DIAGNOSIS — R634 Abnormal weight loss: Secondary | ICD-10-CM

## 2024-04-16 DIAGNOSIS — K863 Pseudocyst of pancreas: Secondary | ICD-10-CM | POA: Diagnosis not present

## 2024-04-16 DIAGNOSIS — R630 Anorexia: Secondary | ICD-10-CM

## 2024-04-16 DIAGNOSIS — F17200 Nicotine dependence, unspecified, uncomplicated: Secondary | ICD-10-CM

## 2024-04-16 DIAGNOSIS — E8809 Other disorders of plasma-protein metabolism, not elsewhere classified: Secondary | ICD-10-CM | POA: Diagnosis not present

## 2024-04-16 DIAGNOSIS — J90 Pleural effusion, not elsewhere classified: Principal | ICD-10-CM

## 2024-04-16 DIAGNOSIS — J189 Pneumonia, unspecified organism: Secondary | ICD-10-CM | POA: Diagnosis not present

## 2024-04-16 DIAGNOSIS — Y95 Nosocomial condition: Secondary | ICD-10-CM

## 2024-04-16 DIAGNOSIS — R1010 Upper abdominal pain, unspecified: Secondary | ICD-10-CM

## 2024-04-16 LAB — RESPIRATORY PANEL BY PCR

## 2024-04-16 LAB — BASIC METABOLIC PANEL WITH GFR
Anion gap: 11 (ref 5–15)
BUN: 12 mg/dL (ref 6–20)
CO2: 22 mmol/L (ref 22–32)
Calcium: 8.6 mg/dL — ABNORMAL LOW (ref 8.9–10.3)
Chloride: 103 mmol/L (ref 98–111)
Creatinine, Ser: 0.93 mg/dL (ref 0.44–1.00)
GFR, Estimated: >60 mL/min (ref >60)
Glucose, Bld: 108 mg/dL — ABNORMAL HIGH (ref 70–99)
Potassium: 3.4 mmol/L — ABNORMAL LOW (ref 3.5–5.1)
Sodium: 136 mmol/L (ref 135–145)

## 2024-04-16 LAB — CBC
HCT: 33.8 % — ABNORMAL LOW (ref 36.0–46.0)
Hemoglobin: 10.9 g/dL — ABNORMAL LOW (ref 12.0–15.0)
MCH: 27.3 pg (ref 26.0–34.0)
MCHC: 32.2 g/dL (ref 30.0–36.0)
MCV: 84.7 fL (ref 80.0–100.0)
Platelets: 505 10*3/uL — ABNORMAL HIGH (ref 150–400)
RBC: 3.99 MIL/uL (ref 3.87–5.11)
RDW: 13.6 % (ref 11.5–15.5)
WBC: 16.2 10*3/uL — ABNORMAL HIGH (ref 4.0–10.5)
nRBC: 0 % (ref 0.0–0.2)

## 2024-04-16 LAB — RAPID URINE DRUG SCREEN, HOSP PERFORMED
Amphetamines: NOT DETECTED
Barbiturates: NOT DETECTED
Benzodiazepines: NOT DETECTED
Cocaine: NOT DETECTED
Opiates: NOT DETECTED
Tetrahydrocannabinol: POSITIVE — AB

## 2024-04-16 LAB — IRON AND TIBC
Iron: 17 ug/dL — ABNORMAL LOW (ref 28–170)
Saturation Ratios: 9 % — ABNORMAL LOW (ref 10.4–31.8)
TIBC: 190 ug/dL — ABNORMAL LOW (ref 250–450)
UIBC: 173 ug/dL

## 2024-04-16 LAB — MAGNESIUM: Magnesium: 1.8 mg/dL (ref 1.7–2.4)

## 2024-04-16 LAB — TSH: TSH: 1.579 u[IU]/mL (ref 0.350–4.500)

## 2024-04-16 LAB — PHOSPHORUS: Phosphorus: 4.3 mg/dL (ref 2.5–4.6)

## 2024-04-16 LAB — FOLATE: Folate: 4.8 ng/mL — ABNORMAL LOW (ref >5.9)

## 2024-04-16 LAB — STREP PNEUMONIAE URINARY ANTIGEN: Strep Pneumo Urinary Antigen: NEGATIVE

## 2024-04-16 LAB — MRSA NEXT GEN BY PCR, NASAL: MRSA by PCR Next Gen: NOT DETECTED

## 2024-04-16 LAB — HIV ANTIBODY (ROUTINE TESTING W REFLEX): HIV Screen 4th Generation wRfx: NONREACTIVE

## 2024-04-16 LAB — VITAMIN D 25 HYDROXY (VIT D DEFICIENCY, FRACTURES): Vit D, 25-Hydroxy: 14.04 ng/mL — ABNORMAL LOW (ref 30–100)

## 2024-04-16 LAB — VITAMIN B12: Vitamin B-12: 80 pg/mL — ABNORMAL LOW (ref 180–914)

## 2024-04-16 MED ORDER — OXYCODONE HCL 5 MG PO TABS
5.0000 mg | ORAL_TABLET | Freq: Four times a day (QID) | ORAL | Status: DC | PRN
  Administered 2024-04-16 – 2024-04-19 (×11): 5 mg via ORAL
  Filled 2024-04-16 (×11): qty 1

## 2024-04-16 MED ORDER — POTASSIUM CHLORIDE CRYS ER 20 MEQ PO TBCR
20.0000 meq | EXTENDED_RELEASE_TABLET | Freq: Every day | ORAL | Status: DC
  Administered 2024-04-16 – 2024-04-23 (×8): 20 meq via ORAL
  Filled 2024-04-16 (×8): qty 1

## 2024-04-16 MED ORDER — METRONIDAZOLE 500 MG/100ML IV SOLN
500.0000 mg | Freq: Two times a day (BID) | INTRAVENOUS | Status: DC
  Administered 2024-04-16 (×2): 500 mg via INTRAVENOUS
  Filled 2024-04-16 (×2): qty 100

## 2024-04-16 MED ORDER — VITAMIN B-12 1000 MCG PO TABS: 1000.0000 ug | ORAL_TABLET | Freq: Every day | ORAL | Status: DC

## 2024-04-16 MED ORDER — KETOROLAC TROMETHAMINE 15 MG/ML IJ SOLN
15.0000 mg | Freq: Three times a day (TID) | INTRAMUSCULAR | Status: AC
  Administered 2024-04-16 – 2024-04-19 (×9): 15 mg via INTRAVENOUS
  Filled 2024-04-16 (×9): qty 1

## 2024-04-16 MED ORDER — VANCOMYCIN HCL 1250 MG/250ML IV SOLN
1250.0000 mg | Freq: Two times a day (BID) | INTRAVENOUS | Status: DC
  Administered 2024-04-16: 1250 mg via INTRAVENOUS
  Filled 2024-04-16 (×2): qty 250

## 2024-04-16 MED ORDER — MUPIROCIN 2 % EX OINT
TOPICAL_OINTMENT | Freq: Two times a day (BID) | CUTANEOUS | Status: DC
  Filled 2024-04-16 (×9): qty 22

## 2024-04-16 MED ORDER — SODIUM CHLORIDE 0.9 % IV SOLN
2.0000 g | Freq: Three times a day (TID) | INTRAVENOUS | Status: DC
  Administered 2024-04-16 (×2): 2 g via INTRAVENOUS
  Filled 2024-04-16 (×2): qty 12.5

## 2024-04-16 MED ORDER — DOXYCYCLINE HYCLATE 100 MG PO TABS
100.0000 mg | ORAL_TABLET | Freq: Two times a day (BID) | ORAL | Status: AC
  Administered 2024-04-16 – 2024-04-23 (×14): 100 mg via ORAL
  Filled 2024-04-16 (×14): qty 1

## 2024-04-16 MED ORDER — ACETAMINOPHEN 325 MG PO TABS
650.0000 mg | ORAL_TABLET | Freq: Three times a day (TID) | ORAL | Status: DC
  Administered 2024-04-16 – 2024-04-26 (×31): 650 mg via ORAL
  Filled 2024-04-16 (×31): qty 2

## 2024-04-16 MED ORDER — AMOXICILLIN-POT CLAVULANATE 875-125 MG PO TABS
1.0000 | ORAL_TABLET | Freq: Two times a day (BID) | ORAL | Status: DC
  Administered 2024-04-16 – 2024-04-20 (×8): 1 via ORAL
  Filled 2024-04-16 (×8): qty 1

## 2024-04-16 MED ORDER — CYANOCOBALAMIN 1000 MCG/ML IJ SOLN
1000.0000 ug | Freq: Every day | INTRAMUSCULAR | Status: DC
  Administered 2024-04-16 – 2024-04-19 (×4): 1000 ug via INTRAMUSCULAR
  Filled 2024-04-16 (×4): qty 1

## 2024-04-16 MED ORDER — KETOROLAC TROMETHAMINE 15 MG/ML IJ SOLN
15.0000 mg | Freq: Three times a day (TID) | INTRAMUSCULAR | Status: DC | PRN
  Administered 2024-04-19 – 2024-04-21 (×4): 15 mg via INTRAVENOUS
  Filled 2024-04-16 (×4): qty 1

## 2024-04-16 MED ORDER — VITAMIN C 500 MG PO TABS
500.0000 mg | ORAL_TABLET | Freq: Every day | ORAL | Status: DC
  Administered 2024-04-16 – 2024-04-25 (×10): 500 mg via ORAL
  Filled 2024-04-16 (×10): qty 1

## 2024-04-16 MED ORDER — UMECLIDINIUM-VILANTEROL 62.5-25 MCG/ACT IN AEPB
1.0000 | INHALATION_SPRAY | Freq: Every day | RESPIRATORY_TRACT | Status: DC
  Administered 2024-04-17 – 2024-04-26 (×8): 1 via RESPIRATORY_TRACT
  Filled 2024-04-16: qty 14

## 2024-04-16 MED ORDER — POLYSACCHARIDE IRON COMPLEX 150 MG PO CAPS
150.0000 mg | ORAL_CAPSULE | Freq: Every day | ORAL | Status: DC
  Administered 2024-04-16 – 2024-04-24 (×9): 150 mg via ORAL
  Filled 2024-04-16 (×10): qty 1

## 2024-04-16 MED ORDER — VITAMIN D (ERGOCALCIFEROL) 1.25 MG (50000 UNIT) PO CAPS
50000.0000 [IU] | ORAL_CAPSULE | ORAL | Status: DC
  Administered 2024-04-16 – 2024-04-23 (×2): 50000 [IU] via ORAL
  Filled 2024-04-16 (×2): qty 1

## 2024-04-16 MED ORDER — SODIUM CHLORIDE 0.9 % IV SOLN: INTRAVENOUS | Status: DC

## 2024-04-16 NOTE — Consult Note (Addendum)
 Consultation Note   Referring Provider:  [facility] PCP: [facility] Primary Gastroenterologist:    Para Bold     Reason for Consultation:  Pancreatitis DOA: 04/15/2024         Hospital Day: 2   ASSESSMENT    38 year old female admitted with sepsis , pneumonia and acute pancreatitis. Initially diagnosed at [address] [facility] 03/18/24.  Now with evolving pseudocysts on CT scan (initially thought to be abscesses).  Etiology of pancreatitis unclear . Does have history of tobacco use. No significant alcohol use.  No evidence for biliary disease. In [place] her triglycerides / serum Ca+ were normal.  No family history of pancreatic diseases /cancer .  No pancreatic masses on imaging. She was bitten by a brown recluse spider around the time she developed pancreatitis but a quick literature review shows those bites are not commonly associated with pancreatitis.  Pancreatitis could possibly be medication related as she does take hydrochlorothiazide .  However she has taken this on and off for years making it seem an unlikely culprit  HCAP To recent hospitalizations within the last month for LLL pneumonia.  Continues to have some respiratory symptoms.  Chest x-ray this admission shows possible bibasilar infiltrate /pleural effusion.  Workup in progress, getting IV antibiotics.    See PMH for additional history  Principal Problem:   Sepsis due to pneumonia [facility]) Active Problems:   Acute pancreatitis   Intra-abdominal abscess (HCC)   Pleural effusion on left   Hospital-acquired pneumonia   Tobacco use disorder     PLAN:   --Interventional radiology has evaluated and does not feel fluid collections represent abscesses but rather pseudocysts.  Given this, can antibiotics be narrowed?  She is being treated for HCAP -- Long discussion with patient about the usual course of pancreatitis as well as potential complications  including pseudocyst formation.  For now management is supportive.  If / when fluid collections mature she could need drainage if remains symptomatic -- Her lack of nutrition is concerning.  Would ask nutritionist to evaluate current nutritional status / calorie count.  Will try a low-fat diet but if caloric intake insufficient then need Cortrak placement with postpyloric feeding -- Continue IV fluids, analgesics, antiemetics   HPI   38 y.o. year old female with a medical history including but not limited to hypertension, morbid obesity, bipolar disorder, tobacco use disorder  Brief GI history  Katrina Lambert was admitted to [facility] 03/18/2024 with acute pancreatitis of unclear etiology.  Her triglycerides were normal, serum calcium normal.  No history of significant alcohol use.  Her gallbladder is out, LFTs were normal and there was no biliary duct dilation on imaging.  No family history of pancreatic problems.  She remained in the hospital for about 3 days.  During that admission she was also treated for left thigh abscess which she says resulted from a brown recluse bite.  she says her abdominal pain never really did improve following hospital discharge.  She has been unable to eat very little over the last few weeks and reports an unintentional  26 pound weight loss.   Katrina Lambert was readmitted to [facility] for 3 days on 04/02/2024 .  She presented there with shortness of breath and  dyspnea.  Workup revealed left lower lobe pneumonia.  She was treated with steroids, IV antibiotics  Interval history Katrina Lambert presented to [facility] ED yesterday with cough, fevers, ongoing upper abdominal pain , nausea and vomiting .  She can only eat small amounts of food due to abdominal pain, N/V.  WBC 19.6 , lipase 146 , LFTs normal.  Renal function normal . CXR showed moderate left pleural effusion with bibasilar atelectasis or infiltrate, possibly pneumonia.  CT scan showed acute pancreatitis with multiple fluid collections  worrisome for abscesses.   CT scan with contrast  Summary Inflammatory stranding surrounding the pancreatic tail.  Also a focal hypodensity in the pancreatic tail measuring 1.5 x 0.5 cm which is new from prior. Findings compatible with acute pancreatitis involving the pancreatic tail. There are multiple enhancing fluid collections in the left upper quadrant adjacent to the spleen, stomach, and pancreatic tail worrisome for abscesses. The largest collection is in the left upper quadrant measuring 8.7 cm.Hepatomegaly.  She was admitted with a working diagnosis of sepsis ? 2/2 to pneumonia or pancreatitis with associated abscesses.  She was started on broad-spectrum antibiotics . Interventional radiology was consulted for aspiration and drain placement but felt fluid collections were most compatible with pseudocysts  At this point she remains on Maxipime, Flagyl, and vancomycin   Labs and Imaging:  Recent Labs    04/15/24 1731  PROT 6.7  ALBUMIN 2.4*  AST 23  ALT 26  ALKPHOS 83  BILITOT 0.5   Recent Labs    04/15/24 1731 04/16/24 0631  WBC 19.6* 16.2*  HGB 12.8 10.9*  HCT 39.5 33.8*  MCV 84.6 84.7  PLT 566* 505*   Recent Labs    04/15/24 1731 04/16/24 0631  NA 136 136  K 3.4* 3.4*  CL 101 103  CO2 22 22  GLUCOSE 106* 108*  BUN 13 12  CREATININE 0.92 0.93  CALCIUM 9.1 8.6*     CT CHEST ABDOMEN PELVIS W CONTRAST CLINICAL DATA:  Left flank pain and hematuria with sepsis and shortness of breath.  EXAM: CT CHEST, ABDOMEN, AND PELVIS WITH CONTRAST  TECHNIQUE: Multidetector CT imaging of the chest, abdomen and pelvis was performed following the standard protocol during bolus administration of intravenous contrast.  RADIATION DOSE REDUCTION: This exam was performed according to the departmental dose-optimization program which includes automated exposure control, adjustment of the mA and/or kV according to patient size and/or use of iterative reconstruction  technique.  CONTRAST:  75mL OMNIPAQUE IOHEXOL 350 MG/ML SOLN  COMPARISON:  CT abdomen and pelvis 12/07/2023. Report only CT angiogram chest 07/01/2023.  FINDINGS: CT CHEST FINDINGS  Cardiovascular: No significant vascular findings. Normal heart size. No pericardial effusion.  Mediastinum/Nodes: No enlarged mediastinal, hilar, or axillary lymph nodes. Thyroid gland, trachea, and esophagus demonstrate no significant findings.  Lungs/Pleura: There is a small left pleural effusion which is partially loculated in the upper hemithorax. There is compressive atelectasis of the left lower lobe. The right lung is clear. There is no pneumothorax.  Musculoskeletal: Cervical spinal fusion hardware is present. No acute bony abnormality.  CT ABDOMEN PELVIS FINDINGS  Hepatobiliary: The liver is enlarged. No focal liver lesion identified. Gallbladder surgically absent. No biliary ductal dilatation.  Pancreas: There is inflammatory stranding surrounding the pancreatic tail. There is a focal hypodensity in the pancreatic tail measuring 1.5 by 0.5 cm which is new from prior. The remaining pancreas appears within normal limits.  Spleen: Spleen is normal in size and enhances normally. Low-density peripherally enhancing subcapsular fluid  collection seen adjacent to the spleen measuring 3.1 x 8.5 by 4.5 cm. No splenic laceration.  Adrenals/Urinary Tract: Adrenal glands are unremarkable. Kidneys are normal, without renal calculi, focal lesion, or hydronephrosis. Bladder is unremarkable.  Stomach/Bowel: There is enhancing fluid collection adjacent to the fundus of the stomach and adjacent liver measuring 2.5 x 2.0 x 4.7 cm. There is a second enhancing fluid collection adjacent to the greater curvature of the stomach with adjacent stomach wall thickening measuring 3.2 x 3.0 x 3.7 cm. The stomach is nondilated. No other dilated bowel loops are visualized. The appendix is within normal  limits.  Vascular/Lymphatic: Aortic atherosclerosis. No enlarged abdominal or pelvic lymph nodes.  Reproductive: There is an IUD in the uterus. Adnexa are within normal limits.  Other: There is no ascites or focal abdominal wall hernia. There is stranding and a small amount of fluid in the left upper quadrant. Enhancing fluid collection in the left upper quadrant measures 3.4 x 2.0 by 8.7 cm image 2/59. There is no free intraperitoneal air or focal abdominal wall hernia.  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 There is grade 1 anterolisthesis and severe degenerative changes at L3-L4.  IMPRESSION: 1. Findings compatible with acute pancreatitis involving the pancreatic tail. 2. There are multiple enhancing fluid collections in the left upper quadrant adjacent to the spleen, stomach, and pancreatic tail worrisome for abscesses. The largest collection is in the left upper quadrant measuring 8.7 cm. 3. Small left pleural effusion which is partially loculated in the upper hemithorax. 4. Left lower lobe atelectasis. 5. Hepatomegaly. 6. Aortic atherosclerosis.  Aortic Atherosclerosis (ICD10-I70.0).  Electronically Signed   By: Tyron Gallon M.D.   On: 04/15/2024 23:16 DG Chest 2 View CLINICAL DATA:  Cough. Left back pain. Shortness of breath. Pneumonia diagnosis 1 month ago.  EXAM: CHEST - 2 VIEW  COMPARISON:  07/05/2023  FINDINGS: Shallow inspiration. Heart size and pulmonary vascularity are normal for technique. There is a moderate left pleural effusion with basilar atelectasis or infiltration, possibly indicating pneumonia. Right lung is clear. No pneumothorax. Mediastinal contours appear intact. Postoperative changes in the cervical spine. Inferior pedicular screws appear fractured, unchanged since prior study. Surgical clips in the right upper quadrant.  IMPRESSION: Moderate left pleural effusion with basilar atelectasis  or infiltration, possibly pneumonia.  Electronically Signed   By: Boyce Byes M.D.   On: 04/15/2024 19:15  Past Medical History:  Diagnosis Date   Anxiety    Arthritis    bilateral hips and knees   Asthma    Bipolar disorder (HCC)    Depression    Dyspnea    GERD (gastroesophageal reflux disease)    Headache    Hypertension    Neuromuscular disorder (HCC)    PONV (postoperative nausea and vomiting)    Pre-diabetes     Past Surgical History:  Procedure Laterality Date   ANTERIOR CERVICAL DECOMP/DISCECTOMY FUSION N/A 06/02/2021   Procedure: ANTERIOR CERVICAL DISCECTOMY FUSION CERVICAL FOUR-FIVE, CERVICAL FIVE-SIX, CERVICAL SIX-SEVEN WITH CERVICAL SIX CORPECTOMY;  Surgeon: Dawley, Colby Daub, DO;  Location: [facility] OR;  Service: Neurosurgery;  Laterality: N/A;   CESAREAN SECTION     x 2   CHOLECYSTECTOMY     POSTERIOR CERVICAL FUSION/FORAMINOTOMY N/A 06/06/2021   Procedure: CERVICAL FOUR-FIVE, CERVICAL FIVE-SIX, CERVICAL SIX-SEVEN, CERVICAL SEVEN-THORACIC ONE POSTERIOR CERVICAL INSTRUMENTATION AND FUSION WITH CERVICAL FOUR-CERVICAL SEVEN LAMINECTOMY/DECOMPRESSION;  Surgeon: Dawley, Colby Daub, DO;  Location: [facility] OR;  Service: Neurosurgery;  Laterality: N/A;    History reviewed. No  pertinent family history.  Prior to Admission medications   Medication Sig Start Date End Date Taking? Authorizing Provider  amLODipine  (NORVASC ) 10 MG tablet Take 10 mg by mouth every morning. 01/06/21  Yes [provider]  hydrochlorothiazide  (MICROZIDE ) 12.5 MG capsule Take 12.5 mg by mouth daily. 03/06/24  Yes [provider]  lisinopril  (ZESTRIL ) 20 MG tablet Take 20 mg by mouth daily. 03/06/24  Yes [provider]  metFORMIN (GLUCOPHAGE) 500 MG tablet Take 500 mg by mouth 2 (two) times daily with a meal. 04/04/24  Yes [provider]  potassium chloride (KLOR-CON M) 10 MEQ tablet Take 10 mEq by mouth daily. 03/06/24  Yes [provider]    Current  Facility-Administered Medications  Medication Dose Route Frequency Provider Last Rate Last Admin   0.9 %  sodium chloride  infusion   Intravenous Continuous Amponsah, Prosper M, MD 150 mL/hr at 04/16/24 0829 New Bag at 04/16/24 0829   acetaminophen  (TYLENOL ) tablet 650 mg  650 mg Oral TID Althia Atlas, MD   650 mg at 04/16/24 1016   amLODipine  (NORVASC ) tablet 10 mg  10 mg Oral q morning Amponsah, Prosper M, MD   10 mg at 04/16/24 1017   ceFEPIme (MAXIPIME) 2 g in sodium chloride  0.9 % 100 mL IVPB  2 g Intravenous Q8H Amponsah, Prosper M, MD 200 mL/hr at 04/16/24 1155 2 g at 04/16/24 1155   enoxaparin (LOVENOX) injection 40 mg  40 mg Subcutaneous Q24H Amponsah, Prosper M, MD   40 mg at 04/15/24 2220   hydrochlorothiazide  (HYDRODIURIL ) tablet 12.5 mg  12.5 mg Oral Daily Amponsah, Prosper M, MD   12.5 mg at 04/16/24 1017   HYDROmorphone  (DILAUDID ) injection 0.5 mg  0.5 mg Intravenous Q6H PRN Amponsah, Prosper M, MD   0.5 mg at 04/16/24 0981   ketorolac  (TORADOL ) 15 MG/ML injection 15 mg  15 mg Intravenous Q8H Althia Atlas, MD   15 mg at 04/16/24 1015   Followed by   Cecily Cohen ON 04/19/2024] ketorolac  (TORADOL ) 15 MG/ML injection 15 mg  15 mg Intravenous Q8H PRN Althia Atlas, MD       lisinopril  (ZESTRIL ) tablet 20 mg  20 mg Oral Daily Amponsah, Prosper M, MD   20 mg at 04/16/24 1017   metFORMIN (GLUCOPHAGE) tablet 500 mg  500 mg Oral BID WC Amponsah, Prosper M, MD   500 mg at 04/16/24 1914   methocarbamol  (ROBAXIN ) tablet 500 mg  500 mg Oral Q6H PRN Amponsah, Prosper M, MD   500 mg at 04/16/24 1017   metroNIDAZOLE (FLAGYL) IVPB 500 mg  500 mg Intravenous BID Amponsah, Prosper M, MD 100 mL/hr at 04/16/24 1034 500 mg at 04/16/24 1034   mupirocin ointment (BACTROBAN) 2 %   Topical BID Althia Atlas, MD       nicotine  (NICODERM CQ  - dosed in mg/24 hours) patch 21 mg  21 mg Transdermal Daily Amponsah, Prosper M, MD   21 mg at 04/16/24 1018   ondansetron  (ZOFRAN ) tablet 4 mg  4 mg Oral Q6H PRN Amponsah,  Prosper M, MD       Or   ondansetron  (ZOFRAN ) injection 4 mg  4 mg Intravenous Q6H PRN Amponsah, Prosper M, MD   4 mg at 04/16/24 0151   oxyCODONE  (Oxy IR/ROXICODONE ) immediate release tablet 5 mg  5 mg Oral Q6H PRN Althia Atlas, MD   5 mg at 04/16/24 1017   potassium chloride SA (KLOR-CON M) CR tablet 20 mEq  20 mEq Oral Daily Althia Atlas,  MD   20 mEq at 04/16/24 1017   senna-docusate (Senokot-S) tablet 1 tablet  1 tablet Oral QHS PRN Vita Grip, MD       vancomycin  (VANCOREADY) IVPB 1250 mg/250 mL  1,250 mg Intravenous Q12H Vita Grip, MD        Allergies as of 04/15/2024   (No Known Allergies)    Social History   Socioeconomic History   Marital status: Married    Spouse name: Not on file   Number of children: Not on file   Years of education: Not on file   Highest education level: Not on file  Occupational History   Not on file  Tobacco Use   Smoking status: Every Day    Current packs/day: 1.50    Average packs/day: 1.5 packs/day for 22.0 years (33.0 ttl pk-yrs)    Types: Cigarettes   Smokeless tobacco: Former  Building services engineer status: Never Used  Substance and Sexual Activity   Alcohol use: Yes    Comment: 2-3 a week   Drug use: Yes    Frequency: 7.0 times per week    Types: Marijuana    Comment: dailiy use   Sexual activity: Not on file  Other Topics Concern   Not on file  Social History Narrative   Not on file   Social Drivers of Health   Financial Resource Strain: Low Risk  (03/18/2024)   Received from [facility]   Overall Financial Resource Strain (CARDIA)    Difficulty of Paying Living Expenses: Not very hard  Food Insecurity: No Food Insecurity (04/15/2024)   Hunger Vital Sign    Worried About Running Out of Food in the Last Year: Never true    Ran Out of Food in the Last Year: Never true  Transportation Needs: No Transportation Needs (04/15/2024)   PRAPARE - Administrator, Civil Service (Medical): No    Lack of  Transportation (Non-Medical): No  Recent Concern: Transportation Needs - Unmet Transportation Needs (03/18/2024)   Received from [facility] - Transportation    Lack of Transportation (Medical): No    Lack of Transportation (Non-Medical): Yes  Physical Activity: Inactive (03/18/2024)   Received from [facility]   Exercise Vital Sign    Days of Exercise per Week: 0 days    Minutes of Exercise per Session: 0 min  Stress: Stress Concern Present (03/18/2024)   Received from [facility] - Occupational Stress Questionnaire    Feeling of Stress : Rather much  Social Connections: Socially Isolated (03/18/2024)   Received from [facility]   Social Connection and Isolation Panel [NHANES]    Frequency of Communication with Friends and Family: More than three times a week    Frequency of Social Gatherings with Friends and Family: More than three times a week    Attends Religious Services: Never    Database administrator or Organizations: No    Attends Banker Meetings: Never    Marital Status: Separated  Intimate Partner Violence: Not At Risk (04/15/2024)   Humiliation, Afraid, Rape, and Kick questionnaire    Fear of Current or Ex-Partner: No    Emotionally Abused: No    Physically Abused: No    Sexually Abused: No     Code Status   Code Status: Full Code  Review of Systems: All systems reviewed and negative except where noted in  HPI.  Physical Exam: Vital signs in last 24 hours: Temp:  [97.9 F (36.6 C)-98.8 F (37.1 C)] 97.9 F (36.6 C) (06/01 0834) Pulse Rate:  [85-114] 85 (06/01 0834) Resp:  [16-26] 18 (06/01 0834) BP: (119-158)/(71-99) 141/83 (06/01 0834) SpO2:  [95 %-99 %] 98 % (06/01 0834) Weight:  [409 kg] 118 kg (05/31 1728) Last BM Date : 04/15/24  General:  Pleasant female in NAD Psych:  Cooperative. Normal mood and affect Eyes: Pupils equal Ears:  Normal auditory acuity Nose: No deformity,  discharge or lesions Neck:  Supple, no masses felt Lungs:  Clear to auscultation.  Heart:  Regular rate, regular rhythm.  Abdomen:  Soft, nondistended, nontender, active bowel sounds, no masses felt Rectal :  Deferred Msk: Symmetrical without gross deformities.  Neurologic:  Alert, oriented, grossly normal neurologically Extremities : No edema Skin:  Intact without significant lesions.    Intake/Output from previous day: 05/31 0701 - 06/01 0700 In: 1178 [P.O.:120; IV Piggyback:1058] Out: -  Intake/Output this shift:  No intake/output data recorded.   Mai Schwalbe, NP-C   04/16/2024, 12:42 PM  I have taken an interval history, thoroughly reviewed the chart and examined the patient. I agree with the Advanced Practitioner's note, impression and recommendations, and have recorded additional findings, impressions and recommendations below. I performed a substantive portion of this encounter (>50% time spent), including a complete performance of the medical decision making.  My additional thoughts are as follows:  Pancreatitis of unclear cause occurred nearly a month ago and has now progressed and evolved with multiple peripancreatic and upper abdominal inflammatory collections and pseudocysts.  Ongoing upper abdominal pain radiating toward left side and back, along with nausea that are both significantly limiting her oral intake over the last several weeks. She has some degree of malnutrition with an albumin of 2.4, will get a prealbumin tomorrow morning and a nutrition consult.  She wants to try some oral nutrition and see how that goes.  That is reasonable to start slowly, but I have already told her that she may need to have a nasoduodenal feeding tube placed soon. IR consulted for the largest perisplenic fluid collection, they do not feel it is an abscess and have decided not to drain that out of concern for possibly introducing infection. Her septic picture with leukocytosis seems most  likely to be from this diffuse inflammatory process.  Unknown if there is true pneumonia versus atelectasis and effusion from this process.  Currently on broad-spectrum antibiotics with improving WBC since admission. None of the fluid collections are likely mature enough to be amenable to EUS guided drainage.  Needs ongoing supportive care with what she is receiving now, additional plans as noted above.  I have told her she is likely to have a hospitalization of a week or more depending on trajectory over the next few days.   Dr. Yvone Herd will pick up the consult service for us  tomorrow.   60 minutes were spent on this encounter, including in depth chart review, independent review of results as outlined above, communicating results with the patient directly, face-to-face time with the patient, coordinating care, ordering studies and medications as appropriate, and documentation.  Outside records also reviewed and images from CTAP this admission personally reviewed.  Kerby Pearson III Office:[phone]

## 2024-04-16 NOTE — Consult Note (Signed)
 NAME:  Katrina Lambert, MRN:  [id number], DOB:  10/28/1986, LOS: 1 ADMISSION DATE:  04/15/2024, CONSULTATION DATE:  04/16/24 REFERRING MD:  Althia Atlas, MD CHIEF COMPLAINT:  pneumonia   History of Present Illness:  Katrina Lambert is a 38 year old woman daily smoker with history of hypertension, obesity, bipolar disorder, GERD and asthma who is admitted for sepsis due to pneumonia and possible pancreatitis.   PCCM consulted for pneumonia and left pleural effusion. She was treated for pneumonia 2 weeks ago at [facility].   CT Chest shows partially loculated left pleural effusion. Findings of acute pancreatitis and multiple fluid enhancing collections in the left upper quadrant. 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  ID consulted, recommended to complete course of doxycycline and augmentin.   Patient reports cough, sputum production and some wheezing along with dyspnea. Denies hemoptysis. She feels like she has subjective fevers.  Pertinent  Medical History   Past Medical History:  Diagnosis Date   Anxiety    Arthritis    bilateral hips and knees   Asthma    Bipolar disorder (HCC)    Depression    Dyspnea    GERD (gastroesophageal reflux disease)    Headache    Hypertension    Neuromuscular disorder (HCC)    PONV (postoperative nausea and vomiting)    Pre-diabetes      Significant Hospital Events: Including procedures, antibiotic start and stop dates in addition to other pertinent events   5/31 admitted to hospital 6/1 PCCM, IR, ID consulted  Interim History / Subjective:  As above  Objective    Blood pressure (!) 141/83, pulse 85, temperature 97.9 F (36.6 C), temperature source Oral, resp. rate 18, height 5\' 5"  (1.651 m), weight 118 kg, SpO2 98%.        Intake/Output Summary (Last 24 hours) at 04/16/2024 1500 Last data filed at 04/16/2024 0830 Gross per 24 hour  Intake 1178.02 ml   Output --  Net 1178.02 ml   Filed Weights   04/15/24 1728  Weight: 118 kg    Examination: General: young woman, obese, no acute distress HENT: [place]/AT, moist mucous membranes Lungs: scattered wheezing - mild Cardiovascular: rrr, no murmurs Abdomen: soft, mildly tender Extremities: warm, no edema Neuro: alert, oriented moving all extremities GU: n/a  Resolved problem list   Assessment and Plan   Sepsis due to pancreatitis Left Partially Loculated Pleural Effusion  Anemia Thrombocytosis  Plan: - plan for thoracentesis vs chest tube tomorrow with plan to send pleural fluid studies and cultures - effusion could be coming from abdominal process with pancreatitis vs parapneumonia effusion given recent pneumonia - agree with doxycycline and augmentin at this time - Check CRP, ESR - start anoro inhaler for asthma, avoiding inhaled steroids to due possible infection - use as needed albuterol   PCCM will continue to follow    Best Practice (right click and "Reselect all SmartList Selections" daily)   Per primary  Labs   CBC: Recent Labs  Lab 04/15/24 1731 04/16/24 0631  WBC 19.6* 16.2*  HGB 12.8 10.9*  HCT 39.5 33.8*  MCV 84.6 84.7  PLT 566* 505*    Basic Metabolic Panel: Recent Labs  Lab 04/15/24 1731 04/16/24 0631 04/16/24 0924  NA 136 136  --   K 3.4* 3.4*  --   CL 101 103  --   CO2 22 22  --   GLUCOSE 106* 108*  --  BUN 13 12  --   CREATININE 0.92 0.93  --   CALCIUM 9.1 8.6*  --   MG  --   --  1.8  PHOS  --   --  4.3   GFR: Estimated Creatinine Clearance: 106.4 mL/min (by C-G formula based on SCr of 0.93 mg/dL). Recent Labs  Lab 04/15/24 1731 04/15/24 2020 04/16/24 0631  PROCALCITON  --  0.75  --   WBC 19.6*  --  16.2*    Liver Function Tests: Recent Labs  Lab 04/15/24 1731  AST 23  ALT 26  ALKPHOS 83  BILITOT 0.5  PROT 6.7  ALBUMIN 2.4*   Recent Labs  Lab 04/15/24 1731  LIPASE 146*   No results for input(s): "AMMONIA" in  the last 168 hours.  ABG No results found for: "PHART", "PCO2ART", "PO2ART", "HCO3", "TCO2", "ACIDBASEDEF", "O2SAT"   Coagulation Profile: No results for input(s): "INR", "PROTIME" in the last 168 hours.  Cardiac Enzymes: No results for input(s): "CKTOTAL", "CKMB", "CKMBINDEX", "TROPONINI" in the last 168 hours.  HbA1C: Hgb A1c MFr Bld  Date/Time Value Ref Range Status  05/30/2021 01:55 PM 5.7 (H) 4.8 - 5.6 % Final    Comment:    (NOTE) Pre diabetes:          5.7%-6.4%  Diabetes:              >6.4%  Glycemic control for   <7.0% adults with diabetes     CBG: No results for input(s): "GLUCAP" in the last 168 hours.  Review of Systems:   Review of Systems  Constitutional:  Positive for fever. Negative for chills, malaise/fatigue and weight loss.  HENT:  Negative for congestion, sinus pain and sore throat.   Eyes: Negative.   Respiratory:  Positive for cough, sputum production, shortness of breath and wheezing. Negative for hemoptysis.   Cardiovascular:  Positive for chest pain. Negative for palpitations, orthopnea, claudication and leg swelling.  Gastrointestinal:  Negative for abdominal pain, heartburn, nausea and vomiting.  Genitourinary: Negative.   Musculoskeletal:  Negative for joint pain and myalgias.  Skin:  Negative for rash.  Neurological:  Negative for weakness.  Endo/Heme/Allergies: Negative.   Psychiatric/Behavioral: Negative.     Past Medical History:  She,  has a past medical history of Anxiety, Arthritis, Asthma, Bipolar disorder (HCC), Depression, Dyspnea, GERD (gastroesophageal reflux disease), Headache, Hypertension, Neuromuscular disorder (HCC), PONV (postoperative nausea and vomiting), and Pre-diabetes.   Surgical History:   Past Surgical History:  Procedure Laterality Date   ANTERIOR CERVICAL DECOMP/DISCECTOMY FUSION N/A 06/02/2021   Procedure: ANTERIOR CERVICAL DISCECTOMY FUSION CERVICAL FOUR-FIVE, CERVICAL FIVE-SIX, CERVICAL SIX-SEVEN WITH  CERVICAL SIX CORPECTOMY;  Surgeon: Dawley, Colby Daub, DO;  Location: [facility] OR;  Service: Neurosurgery;  Laterality: N/A;   CESAREAN SECTION     x 2   CHOLECYSTECTOMY     POSTERIOR CERVICAL FUSION/FORAMINOTOMY N/A 06/06/2021   Procedure: CERVICAL FOUR-FIVE, CERVICAL FIVE-SIX, CERVICAL SIX-SEVEN, CERVICAL SEVEN-THORACIC ONE POSTERIOR CERVICAL INSTRUMENTATION AND FUSION WITH CERVICAL FOUR-CERVICAL SEVEN LAMINECTOMY/DECOMPRESSION;  Surgeon: Dawley, Colby Daub, DO;  Location: [facility] OR;  Service: Neurosurgery;  Laterality: N/A;     Social History:   reports that she has been smoking cigarettes. She has a 33 pack-year smoking history. She has quit using smokeless tobacco. She reports current alcohol use. She reports current drug use. Frequency: 7.00 times per week. Drug: Marijuana.   Family History:  Her family history is not on file.   Allergies No Known Allergies   Home Medications  Prior  to Admission medications   Medication Sig Start Date End Date Taking? Authorizing Provider  amLODipine  (NORVASC ) 10 MG tablet Take 10 mg by mouth every morning. 01/06/21  Yes [provider]  hydrochlorothiazide  (MICROZIDE ) 12.5 MG capsule Take 12.5 mg by mouth daily. 03/06/24  Yes [provider]  lisinopril  (ZESTRIL ) 20 MG tablet Take 20 mg by mouth daily. 03/06/24  Yes [provider]  metFORMIN (GLUCOPHAGE) 500 MG tablet Take 500 mg by mouth 2 (two) times daily with a meal. 04/04/24  Yes [provider]  potassium chloride (KLOR-CON M) 10 MEQ tablet Take 10 mEq by mouth daily. 03/06/24  Yes [provider]     Critical care time: n/a    Duaine German, MD [facility] Office: [phone]   See Amion for personal pager PCCM on call pager [phone] until 7pm. Please call Elink 7p-7a. [phone]

## 2024-04-16 NOTE — Progress Notes (Signed)
 IR consulted for LUQ abscess aspiration/drain placement. Imaging reviewed by Dr. Julietta Ogren. The fluid in the LUQ is most compatible with pseudocysts associated with pancreatitis. There are multiple collections and Dr. Julietta Ogren does not recommend percutaneous drainage.   Ordering provider made aware via Epic Chat with Dr. Julietta Ogren.   No IR procedure planned and the order will be deleted.   Cedrik Heindl, AGACNP-BC 04/16/2024, 9:20 AM

## 2024-04-16 NOTE — Plan of Care (Signed)

## 2024-04-16 NOTE — Consult Note (Addendum)
 [facility]    Date of Admission:  04/15/2024     Reason for Consult: cough    Referring Provider: Althia Atlas      Abx: 6/1-c augmentin 6/1-c doxy   5/31-6/1 vanc/cefepime/flagyl       Assessment: 38 yo female with obesity, bioplar, smoker, prediabetic, recent pancreatitis early 03/2024 admitted to [facility] then 2 weeks prior to admission here another visit at [facility] given abx for left lower lobe pna, admitted to [facility] 5/31 due to continued cough and dyspnea, with ct showing multiple abd fluid collection  Afebrile here; mild leukocytosis  Ir consulted felt intraabd fluid on ct scan c/w pancreatic pseudocyst Small loculated left pleural effusion seen on ct as well probably related to recent pancreatitis  5/31 bcx ngtd Lipase 145 Procalcitonin was elevated but not helpful in this case as she has pancreatitis, however reasonable to treat another CAP course; would ask pulm to comment on the ct pleural effusion  She told me cough is mild and not bothersome; it's the left lower chest pain/abd pain that is more bothersome  Would be complete and check respiratory viral pcr  If respiratory viral pcr negative and another week of CAP abx no improvement (I suspect not) I would chalk it up to pleural effusion related   With regard to the abd fluid collections, agree could be pseudocysts and could monitor off abx   Plan: Given no sign of sepsis otherwise would hold abx GI following for pancreatic pseudocysts - agree probably not infected at this time albeit recent abx might confound a little If sign of sepsis would get diagnostic sampling  Would do doxycycline/augmentin for another 6 days pending pulm comment on the ct scan Please discuss chest ct imaging with pulmonology too regarding the left pleural effusion and see if any intervention needed  Respiratory viral pcr Will f/u pending pulm recs Maintain standard isolation  precaution Discussed with primary team     ------------------------------------------------ Principal Problem:   Sepsis due to pneumonia [facility]) Active Problems:   Acute pancreatitis   Intra-abdominal abscess (HCC)   Pleural effusion on left   Hospital-acquired pneumonia   Tobacco use disorder    HPI: Katrina Lambert is a 38 y.o. female with obesity, bioplar, smoker, prediabetic, recent pancreatitis early 03/2024 admitted to [facility] then 2 weeks prior to admission here another visit at [facility] given abx for left lower lobe pna, admitted to [facility] 5/31 due to continued cough and dyspnea, with ct showing multiple abd fluid collection  She came here due to dry cough mostly not better on abx Got doxy[facility]cefdinir at [facility] for "pna" 2 weeks prior  Afebrile Mild leukocytosis Chest/abd pelv ct no pe; multiple fluid collection around pancrea and under spleen. IR reviewed suspect pseudocyst and no plan to intervene Small pleural effusion left side on chest ct and scattered ggo on lungs Pct 0.7 Mrsa nares pcr negative  bsAbx started   Pain left lower chest and upper abd pain Cough is mild No fever, chill Eating not well due to abd pain  No sick contact   History reviewed. No pertinent family history.  Social History   Tobacco Use   Smoking status: Every Day    Current packs/day: 1.50    Average packs/day: 1.5 packs/day for 22.0 years (33.0 ttl pk-yrs)    Types: Cigarettes   Smokeless tobacco: Former  Building services engineer status: Never Used  Substance Use Topics   Alcohol use: Yes    Comment: 2-3 a week   Drug use: Yes    Frequency: 7.0 times per week    Types: Marijuana    Comment: dailiy use    No Known Allergies  Review of Systems: ROS All Other ROS was negative, except mentioned above   Past Medical History:  Diagnosis Date   Anxiety    Arthritis    bilateral hips and knees   Asthma    Bipolar disorder (HCC)    Depression    Dyspnea    GERD  (gastroesophageal reflux disease)    Headache    Hypertension    Neuromuscular disorder (HCC)    PONV (postoperative nausea and vomiting)    Pre-diabetes        Scheduled Meds:  acetaminophen   650 mg Oral TID   amLODipine   10 mg Oral q morning   enoxaparin (LOVENOX) injection  40 mg Subcutaneous Q24H   hydrochlorothiazide   12.5 mg Oral Daily   ketorolac   15 mg Intravenous Q8H   lisinopril   20 mg Oral Daily   metFORMIN  500 mg Oral BID WC   mupirocin ointment   Topical BID   nicotine   21 mg Transdermal Daily   potassium chloride  20 mEq Oral Daily   Continuous Infusions:  sodium chloride  150 mL/hr at 04/16/24 0829   ceFEPime (MAXIPIME) IV 2 g (04/16/24 1155)   metronidazole 500 mg (04/16/24 1034)   vancomycin  1,250 mg (04/16/24 1246)   PRN Meds:.HYDROmorphone  (DILAUDID ) injection, ketorolac  **FOLLOWED BY** [START ON 04/19/2024] ketorolac , methocarbamol , ondansetron  **OR** ondansetron  (ZOFRAN ) IV, oxyCODONE , senna-docusate   OBJECTIVE: Blood pressure (!) 141/83, pulse 85, temperature 97.9 F (36.6 C), temperature source Oral, resp. rate 18, height 5\' 5"  (1.651 m), weight 118 kg, SpO2 98%.  Physical Exam  General/constitutional: no distress, pleasant HEENT: Normocephalic, PER, Conj Clear, EOMI, Oropharynx clear Neck supple CV: rrr no mrg Lungs: clear to auscultation, normal respiratory effort Abd: Soft, mild tenderness left upper quadrant Ext: no edema Skin: No Rash Neuro: nonfocal MSK: no peripheral joint swelling/tenderness/warmth; back spines nontender   Lab Results Lab Results  Component Value Date   WBC 16.2 (H) 04/16/2024   HGB 10.9 (L) 04/16/2024   HCT 33.8 (L) 04/16/2024   MCV 84.7 04/16/2024   PLT 505 (H) 04/16/2024    Lab Results  Component Value Date   CREATININE 0.93 04/16/2024   BUN 12 04/16/2024   NA 136 04/16/2024   K 3.4 (L) 04/16/2024   CL 103 04/16/2024   CO2 22 04/16/2024    Lab Results  Component Value Date   ALT 26 04/15/2024    AST 23 04/15/2024   ALKPHOS 83 04/15/2024   BILITOT 0.5 04/15/2024      Microbiology: Recent Results (from the past 240 hours)  Blood culture (routine x 2)     Status: None (Preliminary result)   Collection Time: 04/15/24  8:15 PM   Specimen: BLOOD RIGHT HAND  Result Value Ref Range Status   Specimen Description BLOOD RIGHT HAND  Final   Special Requests   Final    BOTTLES DRAWN AEROBIC AND ANAEROBIC Blood Culture results may not be optimal due to an inadequate volume of blood received in culture bottles   Culture   Final    NO GROWTH < 12 HOURS Performed at [facility], [address] [place], [place] [address]    Report Status PENDING  Incomplete  Blood culture (routine x  2)     Status: None (Preliminary result)   Collection Time: 04/15/24  8:20 PM   Specimen: BLOOD LEFT ARM  Result Value Ref Range Status   Specimen Description BLOOD LEFT ARM  Final   Special Requests   Final    BOTTLES DRAWN AEROBIC AND ANAEROBIC Blood Culture results may not be optimal due to an inadequate volume of blood received in culture bottles   Culture   Final    NO GROWTH < 12 HOURS Performed at [facility], [address], [place], [place] [address]    Report Status PENDING  Incomplete  MRSA Next Gen by PCR, Nasal     Status: None   Collection Time: 04/15/24 11:13 PM  Result Value Ref Range Status   MRSA by PCR Next Gen NOT DETECTED NOT DETECTED Final    Comment: (NOTE) The GeneXpert MRSA Assay (FDA approved for NASAL specimens only), is one component of a comprehensive MRSA colonization surveillance program. It is not intended to diagnose MRSA infection nor to guide or monitor treatment for MRSA infections. Test performance is not FDA approved in patients less than 40 years old. Performed at [facility], [address] [place], [place] [address]      Serology:    Imaging: If present, new imagings (plain films, ct scans, and mri) have been personally visualized  and interpreted; radiology reports have been reviewed. Decision making incorporated into the Impression / Recommendations.   5/31 ct abd pelv chest FINDINGS: CT CHEST FINDINGS   Cardiovascular: No significant vascular findings. Normal heart size. No pericardial effusion.   Mediastinum/Nodes: No enlarged mediastinal, hilar, or axillary lymph nodes. Thyroid gland, trachea, and esophagus demonstrate no significant findings.   Lungs/Pleura: There is a small left pleural effusion which is partially loculated in the upper hemithorax. There is compressive atelectasis of the left lower lobe. The right lung is clear. There is no pneumothorax.   Musculoskeletal: Cervical spinal fusion hardware is present. No acute bony abnormality.   CT ABDOMEN PELVIS FINDINGS   Hepatobiliary: The liver is enlarged. No focal liver lesion identified. Gallbladder surgically absent. No biliary ductal dilatation.   Pancreas: There is inflammatory stranding surrounding the pancreatic tail. There is a focal hypodensity in the pancreatic tail measuring 1.5 by 0.5 cm which is new from prior. The remaining pancreas appears within normal limits.   Spleen: Spleen is normal in size and enhances normally. Low-density peripherally enhancing subcapsular fluid collection seen adjacent to the spleen measuring 3.1 x 8.5 by 4.5 cm. No splenic laceration.   Adrenals/Urinary Tract: Adrenal glands are unremarkable. Kidneys are normal, without renal calculi, focal lesion, or hydronephrosis. Bladder is unremarkable.   Stomach/Bowel: There is enhancing fluid collection adjacent to the fundus of the stomach and adjacent liver measuring 2.5 x 2.0 x 4.7 cm. There is a second enhancing fluid collection adjacent to the greater curvature of the stomach with adjacent stomach wall thickening measuring 3.2 x 3.0 x 3.7 cm. The stomach is nondilated. No other dilated bowel loops are visualized. The appendix is within normal  limits.   Vascular/Lymphatic: Aortic atherosclerosis. No enlarged abdominal or pelvic lymph nodes.   Reproductive: There is an IUD in the uterus. Adnexa are within normal limits.   Other: There is no ascites or focal abdominal wall hernia. There is stranding and a small amount of fluid in the left upper quadrant. Enhancing fluid collection in the left upper quadrant measures 3.4 x 2.0 by 8.7 cm image 2/59.  There is no free intraperitoneal air or focal abdominal wall hernia.   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 There is grade 1 anterolisthesis and severe degenerative changes at L3-L4.  Impression: 1. Findings compatible with acute pancreatitis involving the pancreatic tail. 2. There are multiple enhancing fluid collections in the left upper quadrant adjacent to the spleen, stomach, and pancreatic tail worrisome for abscesses. The largest collection is in the left upper quadrant measuring 8.7 cm. 3. Small left pleural effusion which is partially loculated in the upper hemithorax. 4. Left lower lobe atelectasis. 5. Hepatomegaly. 6. Aortic atherosclerosis.   Jamesetta Mcbride, MD [facility] [phone] pager    04/16/2024, 1:10 PM

## 2024-04-16 NOTE — Progress Notes (Signed)
 Pharmacy Antibiotic Note  Katrina Lambert is a 38 y.o. female admitted on 04/15/2024 with sepsis due to pneumonia.  Pharmacy has been consulted for Vancomycin  and Cefepime dosing.   Also on Flagyl for intra-abdominal coverage.  Vancomycin  1750 mg IV loading dose given in ED on 5/31 at 9pm. Cefepime 2gm IV x 1 given in ED at 8:24 pm.  Plan: Vancomycin  1250 mg IV Q 12 hrs - begin at 9am on 6/1 Goal AUC 400-550. Expected AUC: 506 using SCr 0.92 and Vd 0.5 (BMI 43. Cefepime 2gm IV given 5/31 at 1951 > continue q8h - begin ~ 4am Flagyl 500 IV q12h per MD Follow renal function, culture data, clinical progress, and antibiotic plans.   Height: 5\' 5"  (165.1 cm) Weight: 118 kg (260 lb 2.3 oz) IBW/kg (Calculated) : 57  Temp (24hrs), Avg:98.5 F (36.9 C), Min:98.3 F (36.8 C), Max:98.7 F (37.1 C)  Recent Labs  Lab 04/15/24 1731  WBC 19.6*  CREATININE 0.92    Estimated Creatinine Clearance: 107.6 mL/min (by C-G formula based on SCr of 0.92 mg/dL).    No Known Allergies  Antimicrobials this admission: Vancomycin  5/31>> Cefepime 5/31>> Flagyl IV 6/1>>   Dose adjustments this admission: n/a  Microbiology results: 5/31 MRSA PCR: negative 6/1 blood: 6/1 sputum:  Thank you for allowing pharmacy to be a part of this patient's care.  Adolphus Akin, RPh 04/16/2024 3:07 AM

## 2024-04-16 NOTE — Progress Notes (Signed)
 [facility] Progress Note  Patient: Katrina Lambert    EAV:[id number]  DOA: 04/15/2024     Date of Service: the patient was seen and examined on 04/16/2024  Chief Complaint  Patient presents with   Pneumonia   Brief hospital course: Katrina Lambert is a 38 y.o. female with medical history significant for anxiety and depression, bipolar disorder, GERD, morbid obesity, prediabetes, tobacco use disorder, cervical myelopathy who presented to the ED for evaluation of dyspnea on exertion, cough and back pain. Patient reports she was bitten by a spider at the beginning of May, was admitted for acute pancreatitis and abscess of her left thigh on May 3 at [facility]. She was then admitted for left lower lobe pneumonia and had a 3-day hospitalization at [facility] 2 weeks ago. Since discharge from the hospital she has not felt completely well even after finishing the oral antibiotics.  He continues to have a dry cough that is occasionally productive as well as mild dyspnea on exertion.  She endorses left flank and LUQ pain with associated nausea, vomiting and occasional headache. She also reports back pain described as sharp. States she has an IUD in place but for the last 2 weeks, she has noticed some blood after urinating but unsure if this is due to hematuria or vaginal bleed.  She denies any chest pain, constipation, shortness of breath at rest, palpitations, dysuria or leg swelling.   ED Course: Initial vitals showed temp 98.6, RR 24, HR 114, BP 158/97, SpO2 95% on room air. Initial vitals significant for WBC 19.6, Hgb 12.8, platelet 566,K+ 3.4, normal creatinine, albumin 2.4, lipase of 46, negative UA and pregnancy test. Chest x-ray shows normal sinus. CXR shows moderate left pleural effusion with bibasilar atelectasis or infiltrate, possible pneumonia. Sepsis protocol was activated and patient received IV LR 1 L bolus, IV vancomycin , IV cefepime, IV Dilaudid , IV Toradol  and Percocet 5-325 mg x 1. TRH  was consulted for admission.   Assessment and Plan:  # Sepsis due to HCAP - p/w multiple complaints including cough, dyspnea on exertion, abdominal pain, back pain, nausea and vomiting - Met sepsis criteria with tachycardia, tachypnea, leukocytosis and evidence of respiratory and intra-abdominal infection -s/p IV vancomycin , cefepime and Flagyl. S/p IV NS 150 cc/h 6/1 ID consulted, DC'd IV antibiotics and transition to Augmentin twice daily x 6 days and doxycycline twice daily for 7 days - Follow-up blood culture - Trend CBC, fever curve   # HCAP - Recent hospitalization 2 weeks ago for left lower lobe pneumonia - Continues to have persistent respiratory symptoms - CXR shows possible bibasilar infiltrate, WBC 19.6, procalcitonin elevated to 0.75 - s/p IV vancomycin  and cefepime ID consulted, started Augmentin and doxycycline as above - Check MRSA screen, urinary Legionella and strep pneumo - Follow-up blood cultures and sputum culture Follow PCCM due to recurrent pneumonia and pleural effusion, patient may need thoracentesis? Follow respiratory viral panel  # Pleural effusion, Left - CXR shows moderate left pleural effusion however CT of the chest shows small left pleural effusion which is partially loculated in the upper hemothorax - No significant respiratory distress - Supplemental O2 as needed Pulmonary consulted, patient may need thoracentesis?   # Acute pancreatitis ass with multiple fluid collections most likely pseudocyst - Patient admitted for acute pancreatitis a month ago at [facility] healthcare - Now presenting with LUQ pain, back pain with associated nausea and vomiting - CT of the abdomen shows evidence of acute pancreatitis with multiple enhancing  fluid collections in the LUQ adjacent to the spleen, stomach and pancreatic tail concerning for abscesses. - Lipase elevated to 146 - Patient nontoxic-appearing but with Mild LUQ tenderness and L CVA tenderness - s/p IV  hydration and broad-spectrum IV antibiotics as above - General Surgery consulted, recommended to hold off until need any surgical intervention, D/w IR, most likely pseudocyst, no need of drain insertion.   GI consulted, recommended continue symptomatic treatment and to pseudocyst mature and patient may need drainage in future if becomes symptomatic.  May not need antibiotics for pseudocyst. - Pain control with PRN Robaxin  and IV Dilaudid      # HTN - Continue amlodipine , lisinopril  and hydrochlorothiazide  Monitor BP and titrate medication accordingly # PCOS # Prediabetes - Continue metformin  # Iron deficiency, transferrin saturation 9%, avoided IV iron due to active infection.  Started oral iron supplement with vitamin C Follow-up with PCP to repeat iron profile after 3 to 6 months  # Folic acid deficiency, started folic acid 1 mg p.o. daily Follow with PCP to repeat folic acid level after 3 to 6 months  # Vitamin D deficiency: started vitamin D 50,000 units p.o. weekly, follow with PCP to repeat vitamin D level after 3 to 6 months.  # Vitamin B12 deficiency: Started vitamin B12 1000 mcg IM injection daily during hospital stay, followed by oral supplement.  Follow-up PCP to repeat vitamin B12 level after 3 to 6 months.   # Tobacco use disorder - Reports she has been smoking cigarettes since the age of 42 - Increased to 2 packs/day over the last 6 years - Has not smoke over the last 3 days - Smoking cessation counseling done, continue nicotine  patch    # Class III obesity Body mass index is 43.29 kg/m.  Interventions:  Pressure Injury 06/06/21 Buttocks Medial Stage 1 -  Intact skin with non-blanchable redness of a localized area usually over a bony prominence. Red (Active)  06/06/21 2030  Location: Buttocks  Location Orientation: Medial  Staging: Stage 1 -  Intact skin with non-blanchable redness of a localized area usually over a bony prominence.  Wound Description  (Comments): Red  Present on Admission: Yes     Diet: Heart healthy diet DVT Prophylaxis: Subcutaneous Lovenox   Advance goals of care discussion: Full code  Family Communication: family was present at bedside, at the time of interview.  The pt provided permission to discuss medical plan with the family. Opportunity was given to ask question and all questions were answered satisfactorily.   Disposition:  Pt is from home, admitted with sepsis due to pneumonia and abdominal pain secondary to pseudocyst due to pancreatitis, still has pain and left pleural effusion, which precludes a safe discharge. Discharge to home, when stable and cleared by pulmonary and GI.  Subjective: No significant events overnight, patient still has pain on the left flank area and back on the left side.  Pain is 5/10 now after pain medications, it was 10/10 earlier.  Overall she feels improvement.  Denied any chest pain or palpitations, no shortness of breath.  Physical Exam: General: NAD, lying comfortably Appear in no distress, affect appropriate Eyes: PERRLA ENT: Oral Mucosa Clear, moist  Neck: no JVD,  Cardiovascular: S1 and S2 Present, no Murmur,  Respiratory: Equal air entry bilaterally, decreased breath sounds on left lower chest and mild crackles, no wheezes.  Abdomen: Bowel Sound present, Soft and LUQ tenderness, left-sided back tenderness Skin: no rashes Extremities: no Pedal edema, no calf tenderness Neurologic:  without any new focal findings Gait not checked due to patient safety concerns  Vitals:   04/15/24 2200 04/15/24 2300 04/16/24 0431 04/16/24 0834  BP: (!) 147/80 (!) 151/86 119/71 (!) 141/83  Pulse: 100 99 95 85  Resp: 18 20 16 18   Temp: 98.3 F (36.8 C) 98.7 F (37.1 C) 98.8 F (37.1 C) 97.9 F (36.6 C)  TempSrc: Oral Oral Oral Oral  SpO2: 96% 95% 97% 98%  Weight:      Height:        Intake/Output Summary (Last 24 hours) at 04/16/2024 1550 Last data filed at 04/16/2024 0830 Gross  per 24 hour  Intake 1178.02 ml  Output --  Net 1178.02 ml   Filed Weights   04/15/24 1728  Weight: 118 kg    Data Reviewed: I have personally reviewed and interpreted daily labs, tele strips, imagings as discussed above. I reviewed all nursing notes, pharmacy notes, vitals, pertinent old records I have discussed plan of care as described above with RN and patient/family.  CBC: Recent Labs  Lab 04/15/24 1731 04/16/24 0631  WBC 19.6* 16.2*  HGB 12.8 10.9*  HCT 39.5 33.8*  MCV 84.6 84.7  PLT 566* 505*   Basic Metabolic Panel: Recent Labs  Lab 04/15/24 1731 04/16/24 0631 04/16/24 0924  NA 136 136  --   K 3.4* 3.4*  --   CL 101 103  --   CO2 22 22  --   GLUCOSE 106* 108*  --   BUN 13 12  --   CREATININE 0.92 0.93  --   CALCIUM 9.1 8.6*  --   MG  --   --  1.8  PHOS  --   --  4.3    Studies: CT CHEST ABDOMEN PELVIS W CONTRAST Result Date: 04/15/2024 CLINICAL DATA:  Left flank pain and hematuria with sepsis and shortness of breath. EXAM: CT CHEST, ABDOMEN, AND PELVIS WITH CONTRAST TECHNIQUE: Multidetector CT imaging of the chest, abdomen and pelvis was performed following the standard protocol during bolus administration of intravenous contrast. RADIATION DOSE REDUCTION: This exam was performed according to the departmental dose-optimization program which includes automated exposure control, adjustment of the mA and/or kV according to patient size and/or use of iterative reconstruction technique. CONTRAST:  75mL OMNIPAQUE IOHEXOL 350 MG/ML SOLN COMPARISON:  CT abdomen and pelvis 12/07/2023. Report only CT angiogram chest 07/01/2023. FINDINGS: CT CHEST FINDINGS Cardiovascular: No significant vascular findings. Normal heart size. No pericardial effusion. Mediastinum/Nodes: No enlarged mediastinal, hilar, or axillary lymph nodes. Thyroid gland, trachea, and esophagus demonstrate no significant findings. Lungs/Pleura: There is a small left pleural effusion which is partially  loculated in the upper hemithorax. There is compressive atelectasis of the left lower lobe. The right lung is clear. There is no pneumothorax. Musculoskeletal: Cervical spinal fusion hardware is present. No acute bony abnormality. CT ABDOMEN PELVIS FINDINGS Hepatobiliary: The liver is enlarged. No focal liver lesion identified. Gallbladder surgically absent. No biliary ductal dilatation. Pancreas: There is inflammatory stranding surrounding the pancreatic tail. There is a focal hypodensity in the pancreatic tail measuring 1.5 by 0.5 cm which is new from prior. The remaining pancreas appears within normal limits. Spleen: Spleen is normal in size and enhances normally. Low-density peripherally enhancing subcapsular fluid collection seen adjacent to the spleen measuring 3.1 x 8.5 by 4.5 cm. No splenic laceration. Adrenals/Urinary Tract: Adrenal glands are unremarkable. Kidneys are normal, without renal calculi, focal lesion, or hydronephrosis. Bladder is unremarkable. Stomach/Bowel: There is enhancing fluid collection adjacent to the  fundus of the stomach and adjacent liver measuring 2.5 x 2.0 x 4.7 cm. There is a second enhancing fluid collection adjacent to the greater curvature of the stomach with adjacent stomach wall thickening measuring 3.2 x 3.0 x 3.7 cm. The stomach is nondilated. No other dilated bowel loops are visualized. The appendix is within normal limits. Vascular/Lymphatic: Aortic atherosclerosis. No enlarged abdominal or pelvic lymph nodes. Reproductive: There is an IUD in the uterus. Adnexa are within normal limits. Other: There is no ascites or focal abdominal wall hernia. There is stranding and a small amount of fluid in the left upper quadrant. Enhancing fluid collection in the left upper quadrant measures 3.4 x 2.0 by 8.7 cm image 2/59. There is no free intraperitoneal air or focal abdominal wall hernia. 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 There is grade 1  anterolisthesis and severe degenerative changes at L3-L4. IMPRESSION: 1. Findings compatible with acute pancreatitis involving the pancreatic tail. 2. There are multiple enhancing fluid collections in the left upper quadrant adjacent to the spleen, stomach, and pancreatic tail worrisome for abscesses. The largest collection is in the left upper quadrant measuring 8.7 cm. 3. Small left pleural effusion which is partially loculated in the upper hemithorax. 4. Left lower lobe atelectasis. 5. Hepatomegaly. 6. Aortic atherosclerosis. Aortic Atherosclerosis (ICD10-I70.0). Electronically Signed   By: Tyron Gallon M.D.   On: 04/15/2024 23:16   DG Chest 2 View Result Date: 04/15/2024 CLINICAL DATA:  Cough. Left back pain. Shortness of breath. Pneumonia diagnosis 1 month ago. EXAM: CHEST - 2 VIEW COMPARISON:  07/05/2023 FINDINGS: Shallow inspiration. Heart size and pulmonary vascularity are normal for technique. There is a moderate left pleural effusion with basilar atelectasis or infiltration, possibly indicating pneumonia. Right lung is clear. No pneumothorax. Mediastinal contours appear intact. Postoperative changes in the cervical spine. Inferior pedicular screws appear fractured, unchanged since prior study. Surgical clips in the right upper quadrant. IMPRESSION: Moderate left pleural effusion with basilar atelectasis or infiltration, possibly pneumonia. Electronically Signed   By: Boyce Byes M.D.   On: 04/15/2024 19:15    Scheduled Meds:  acetaminophen   650 mg Oral TID   amLODipine   10 mg Oral q morning   amoxicillin-clavulanate  1 tablet Oral Q12H   doxycycline  100 mg Oral Q12H   enoxaparin (LOVENOX) injection  40 mg Subcutaneous Q24H   hydrochlorothiazide   12.5 mg Oral Daily   ketorolac   15 mg Intravenous Q8H   lisinopril   20 mg Oral Daily   metFORMIN  500 mg Oral BID WC   mupirocin ointment   Topical BID   nicotine   21 mg Transdermal Daily   potassium chloride  20 mEq Oral Daily    Continuous Infusions:  sodium chloride  150 mL/hr at 04/16/24 0829   PRN Meds: HYDROmorphone  (DILAUDID ) injection, ketorolac  **FOLLOWED BY** [START ON 04/19/2024] ketorolac , methocarbamol , ondansetron  **OR** ondansetron  (ZOFRAN ) IV, oxyCODONE , senna-docusate  Time spent: 55 minutes  Author: Althia Atlas. MD [facility] 04/16/2024 3:50 PM  To reach On-call, see care teams to locate the attending and reach out to them via [email or web address]. If 7PM-7AM, please contact night-coverage If you still have difficulty reaching the attending provider, please page the [facility] (Director on Call) for [facility] on amion for assistance.

## 2024-04-16 NOTE — Consult Note (Addendum)
 WOC Nurse Consult Note: patient with reported history of spider bite to the L hip; was seen by primary MD and in the ER per chart review and prescribed oral antibiotics  Reason for Consult: L hip wound  Wound type: full thickness r/t ?  Pressure Injury POA: NA  Measurement: see nursing flowsheet  Wound ZOX:WRUEAVW red and moist  Drainage (amount, consistency, odor) per nursing flowsheet  Periwound:  Dressing procedure/placement/frequency: Cleanse L hip wound with NS, apply Mupirocin ointment to wound bed 2 times daily, cover with dry gauze and silicone foam.    POC discussed with bedside nurse. WOC team will not follow. Re-consult if further needs arise.   Thank you,    Ronni Colace MSN, RN-BC, [employer] [phone]

## 2024-04-16 NOTE — Plan of Care (Signed)
  Problem: Activity: Goal: Risk for activity intolerance will decrease Outcome: Progressing   Problem: Clinical Measurements: Goal: Cardiovascular complication will be avoided Outcome: Progressing   Problem: Clinical Measurements: Goal: Respiratory complications will improve Outcome: Progressing   Problem: Coping: Goal: Level of anxiety will decrease Outcome: Progressing   Problem: Pain Managment: Goal: General experience of comfort will improve and/or be controlled Outcome: Progressing

## 2024-04-16 NOTE — Progress Notes (Signed)
 Pt on 2L [place] for comfort. Pt reports pain causes difficulty breathing. Gave IV dilaudid , pt reports improvement in respirations,

## 2024-04-17 DIAGNOSIS — K859 Acute pancreatitis without necrosis or infection, unspecified: Secondary | ICD-10-CM | POA: Diagnosis not present

## 2024-04-17 DIAGNOSIS — J189 Pneumonia, unspecified organism: Secondary | ICD-10-CM | POA: Diagnosis not present

## 2024-04-17 DIAGNOSIS — K863 Pseudocyst of pancreas: Secondary | ICD-10-CM | POA: Diagnosis not present

## 2024-04-17 DIAGNOSIS — A419 Sepsis, unspecified organism: Secondary | ICD-10-CM | POA: Diagnosis not present

## 2024-04-17 LAB — CBC
HCT: 34.3 % — ABNORMAL LOW (ref 36.0–46.0)
Hemoglobin: 11 g/dL — ABNORMAL LOW (ref 12.0–15.0)
MCH: 27.3 pg (ref 26.0–34.0)
MCHC: 32.1 g/dL (ref 30.0–36.0)
MCV: 85.1 fL (ref 80.0–100.0)
Platelets: 497 10*3/uL — ABNORMAL HIGH (ref 150–400)
RBC: 4.03 MIL/uL (ref 3.87–5.11)
RDW: 13.5 % (ref 11.5–15.5)
WBC: 19.7 10*3/uL — ABNORMAL HIGH (ref 4.0–10.5)
nRBC: 0 % (ref 0.0–0.2)

## 2024-04-17 LAB — PHOSPHORUS: Phosphorus: 4.9 mg/dL — ABNORMAL HIGH (ref 2.5–4.6)

## 2024-04-17 LAB — BASIC METABOLIC PANEL WITH GFR
Anion gap: 10 (ref 5–15)
BUN: 10 mg/dL (ref 6–20)
CO2: 22 mmol/L (ref 22–32)
Calcium: 8.4 mg/dL — ABNORMAL LOW (ref 8.9–10.3)
Chloride: 102 mmol/L (ref 98–111)
Creatinine, Ser: 0.85 mg/dL (ref 0.44–1.00)
GFR, Estimated: >60 mL/min (ref >60)
Glucose, Bld: 93 mg/dL (ref 70–99)
Potassium: 3.5 mmol/L (ref 3.5–5.1)
Sodium: 134 mmol/L — ABNORMAL LOW (ref 135–145)

## 2024-04-17 LAB — LIPASE, BLOOD: Lipase: 135 U/L — ABNORMAL HIGH (ref 11–51)

## 2024-04-17 LAB — C-REACTIVE PROTEIN: CRP: 19.1 mg/dL — ABNORMAL HIGH (ref <1.0)

## 2024-04-17 LAB — HEPATIC FUNCTION PANEL
ALT: 21 U/L (ref 0–44)
AST: 12 U/L — ABNORMAL LOW (ref 15–41)
Albumin: 2.2 g/dL — ABNORMAL LOW (ref 3.5–5.0)
Alkaline Phosphatase: 91 U/L (ref 38–126)
Bilirubin, Direct: 0.1 mg/dL (ref 0.0–0.2)
Indirect Bilirubin: 0.4 mg/dL (ref 0.3–0.9)
Total Bilirubin: 0.5 mg/dL (ref 0.0–1.2)
Total Protein: 6 g/dL — ABNORMAL LOW (ref 6.5–8.1)

## 2024-04-17 LAB — MAGNESIUM: Magnesium: 1.6 mg/dL — ABNORMAL LOW (ref 1.7–2.4)

## 2024-04-17 LAB — SEDIMENTATION RATE: Sed Rate: 64 mm/h — ABNORMAL HIGH (ref 0–22)

## 2024-04-17 MED ORDER — FOLIC ACID 1 MG PO TABS
1.0000 mg | ORAL_TABLET | Freq: Every day | ORAL | Status: DC
  Administered 2024-04-17 – 2024-04-25 (×9): 1 mg via ORAL
  Filled 2024-04-17 (×9): qty 1

## 2024-04-17 MED ORDER — MAGNESIUM SULFATE 2 GM/50ML IV SOLN
2.0000 g | Freq: Once | INTRAVENOUS | Status: AC
  Administered 2024-04-17: 2 g via INTRAVENOUS
  Filled 2024-04-17: qty 50

## 2024-04-17 NOTE — Progress Notes (Signed)
 [facility]  Date of Admission:  04/15/2024     Reason for Follow Up: Sepsis due to pneumonia [facility])  Total days of antibiotics 3         ASSESSMENT:  Ms. Capers blood cultures remain without growth to date in 2 days in the setting of pancreatitis and left-sided partially loculated pleural effusion thoracentesis/chest tube placement today.  Tolerating antibiotics with no adverse side effects.  Discussed plan of care for fluid drainage and cultures to possibly help narrow and guide antibiotic treatment.  Provided reassurance regarding course of action.  Pancreatitis with no recommended drain placement per interventional radiology as this is believed to be a pseudocyst.  Respiratory panel negative.  Will continue with current dose of doxycycline and amoxicillin/clavulanate.  Discontinue droplet precautions and resume standard/universal precautions.  Remaining medical and supportive care per internal medicine.  PLAN:  Continue current dose of amoxicillin/clavulanate and doxycycline. Thoracentesis/chest tube placement with cultures planned for today. Monitor blood cultures for bacteremia. Resume universal/standard precautions. Remaining medical and supportive care per internal medicine.  Principal Problem:   Sepsis due to pneumonia [facility]) Active Problems:   Acute pancreatitis   Intra-abdominal abscess (HCC)   Pleural effusion on left   Hospital-acquired pneumonia   Tobacco use disorder   Upper abdominal pain   Nausea and vomiting in adult   Loss of appetite   Abnormal loss of weight    acetaminophen   650 mg Oral TID   amLODipine   10 mg Oral q morning   amoxicillin-clavulanate  1 tablet Oral Q12H   ascorbic acid  500 mg Oral Daily   cyanocobalamin  1,000 mcg Intramuscular Daily   Followed by   Cecily Cohen ON 04/23/2024] vitamin B-12  1,000 mcg Oral Daily   doxycycline  100 mg Oral Q12H   enoxaparin (LOVENOX) injection  40 mg Subcutaneous Q24H   folic acid   1 mg Oral Daily   hydrochlorothiazide   12.5 mg Oral Daily   iron polysaccharides  150 mg Oral Daily   ketorolac   15 mg Intravenous Q8H   lisinopril   20 mg Oral Daily   metFORMIN  500 mg Oral BID WC   mupirocin ointment   Topical BID   nicotine   21 mg Transdermal Daily   potassium chloride  20 mEq Oral Daily   umeclidinium-vilanterol  1 puff Inhalation Daily   Vitamin D (Ergocalciferol)  50,000 Units Oral Q7 days    SUBJECTIVE:  Afebrile overnight with no acute events.  Anxious regarding potential thoracentesis or chest tube placement.  Tolerating antibiotics with no adverse side effects.  No Known Allergies   Review of Systems: Review of Systems  Constitutional:  Negative for chills, fever and weight loss.  Respiratory:  Negative for cough, shortness of breath and wheezing.   Cardiovascular:  Negative for chest pain and leg swelling.  Gastrointestinal:  Negative for abdominal pain, constipation, diarrhea, nausea and vomiting.  Skin:  Negative for rash.      OBJECTIVE: Vitals:   04/16/24 2146 04/17/24 0454 04/17/24 0738 04/17/24 0749  BP: (!) 140/104 (!) 144/89 130/87   Pulse: (!) 102 92 94 97  Resp: 18 16 16    Temp: 98.4 F (36.9 C) 98.3 F (36.8 C) 98.5 F (36.9 C)   TempSrc: Oral Oral Oral   SpO2: 91% 93% 92% 97%  Weight:      Height:       Body mass index is 43.29 kg/m.  Physical Exam Constitutional:  General: She is not in acute distress.    Appearance: She is well-developed. She is obese.  Cardiovascular:     Rate and Rhythm: Regular rhythm. Tachycardia present.     Heart sounds: Normal heart sounds.  Pulmonary:     Effort: Pulmonary effort is normal.     Breath sounds: Normal breath sounds.  Skin:    General: Skin is warm and dry.  Neurological:     Mental Status: She is alert and oriented to person, place, and time.  Psychiatric:        Mood and Affect: Mood is anxious.     Lab Results Lab Results  Component Value Date   WBC 19.7 (H)  04/17/2024   HGB 11.0 (L) 04/17/2024   HCT 34.3 (L) 04/17/2024   MCV 85.1 04/17/2024   PLT 497 (H) 04/17/2024    Lab Results  Component Value Date   CREATININE 0.85 04/17/2024   BUN 10 04/17/2024   NA 134 (L) 04/17/2024   K 3.5 04/17/2024   CL 102 04/17/2024   CO2 22 04/17/2024    Lab Results  Component Value Date   ALT 21 04/17/2024   AST 12 (L) 04/17/2024   ALKPHOS 91 04/17/2024   BILITOT 0.5 04/17/2024     Microbiology: Recent Results (from the past 240 hours)  Blood culture (routine x 2)     Status: None (Preliminary result)   Collection Time: 04/15/24  8:15 PM   Specimen: BLOOD RIGHT HAND  Result Value Ref Range Status   Specimen Description BLOOD RIGHT HAND  Final   Special Requests   Final    BOTTLES DRAWN AEROBIC AND ANAEROBIC Blood Culture results may not be optimal due to an inadequate volume of blood received in culture bottles   Culture   Final    NO GROWTH 2 DAYS Performed at [facility], [address] [place], [place] [address]    Report Status PENDING  Incomplete  Blood culture (routine x 2)     Status: None (Preliminary result)   Collection Time: 04/15/24  8:20 PM   Specimen: BLOOD LEFT ARM  Result Value Ref Range Status   Specimen Description BLOOD LEFT ARM  Final   Special Requests   Final    BOTTLES DRAWN AEROBIC AND ANAEROBIC Blood Culture results may not be optimal due to an inadequate volume of blood received in culture bottles   Culture   Final    NO GROWTH 2 DAYS Performed at [facility], [address], [place], [place] [address]    Report Status PENDING  Incomplete  MRSA Next Gen by PCR, Nasal     Status: None   Collection Time: 04/15/24 11:13 PM  Result Value Ref Range Status   MRSA by PCR Next Gen NOT DETECTED NOT DETECTED Final    Comment: (NOTE) The GeneXpert MRSA Assay (FDA approved for NASAL specimens only), is one component of a comprehensive MRSA colonization surveillance program. It is not intended to diagnose  MRSA infection nor to guide or monitor treatment for MRSA infections. Test performance is not FDA approved in patients less than 55 years old. Performed at [facility], [address], [place], [place] [address]   Respiratory (~20 pathogens) panel by PCR     Status: None   Collection Time: 04/16/24  1:59 PM   Specimen: Nasopharyngeal Swab; Respiratory  Result Value Ref Range Status   Adenovirus NOT DETECTED NOT DETECTED Final   Coronavirus 229E  NOT DETECTED NOT DETECTED Final    Comment: (NOTE) The Coronavirus on the Respiratory Panel, DOES NOT test for the novel  Coronavirus (2019 nCoV)    Coronavirus HKU1 NOT DETECTED NOT DETECTED Final   Coronavirus NL63 NOT DETECTED NOT DETECTED Final   Coronavirus OC43 NOT DETECTED NOT DETECTED Final   Metapneumovirus NOT DETECTED NOT DETECTED Final   Rhinovirus / Enterovirus NOT DETECTED NOT DETECTED Final   Influenza A NOT DETECTED NOT DETECTED Final   Influenza B NOT DETECTED NOT DETECTED Final   Parainfluenza Virus 1 NOT DETECTED NOT DETECTED Final   Parainfluenza Virus 2 NOT DETECTED NOT DETECTED Final   Parainfluenza Virus 3 NOT DETECTED NOT DETECTED Final   Parainfluenza Virus 4 NOT DETECTED NOT DETECTED Final   Respiratory Syncytial Virus NOT DETECTED NOT DETECTED Final   Bordetella pertussis NOT DETECTED NOT DETECTED Final   Bordetella Parapertussis NOT DETECTED NOT DETECTED Final   Chlamydophila pneumoniae NOT DETECTED NOT DETECTED Final   Mycoplasma pneumoniae NOT DETECTED NOT DETECTED Final    Comment: Performed at [facility], [address], [place], [place] [address]     Marlan Silva, NP [facility]  04/17/2024  1:47 PM

## 2024-04-17 NOTE — Consult Note (Signed)
 NAME:  KRISTILYN COLTRANE, MRN:  [id number], DOB:  06/04/1986, LOS: 2 ADMISSION DATE:  04/15/2024, CONSULTATION DATE:  04/16/24 REFERRING MD:  Althia Atlas, MD CHIEF COMPLAINT:  pneumonia   History of Present Illness:  Meghen Akopyan is a 38 year old woman daily smoker with history of hypertension, obesity, bipolar disorder, GERD and asthma who is admitted for sepsis due to pneumonia and possible pancreatitis.   PCCM consulted for pneumonia and left pleural effusion. She was treated for pneumonia 2 weeks ago at [facility].   CT Chest shows partially loculated left pleural effusion. Findings of acute pancreatitis and multiple fluid enhancing collections in the left upper quadrant. 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  ID consulted, recommended to complete course of doxycycline and augmentin.   Patient reports cough, sputum production and some wheezing along with dyspnea. Denies hemoptysis. She feels like she has subjective fevers.  Pertinent  Medical History   Past Medical History:  Diagnosis Date   Anxiety    Arthritis    bilateral hips and knees   Asthma    Bipolar disorder (HCC)    Depression    Dyspnea    GERD (gastroesophageal reflux disease)    Headache    Hypertension    Neuromuscular disorder (HCC)    PONV (postoperative nausea and vomiting)    Pre-diabetes     Significant Hospital Events: Including procedures, antibiotic start and stop dates in addition to other pertinent events   5/31 admitted to hospital 6/1 PCCM, IR, ID consulted  Interim History / Subjective:   Breathing a bit better today Continue productive cough  Objective    Blood pressure 130/87, pulse 97, temperature 98.5 F (36.9 C), temperature source Oral, resp. rate 16, height 5\' 5"  (1.651 m), weight 118 kg, SpO2 97%.        Intake/Output Summary (Last 24 hours) at 04/17/2024 1404 Last data filed at 04/17/2024  1204 Gross per 24 hour  Intake 2521.64 ml  Output 500 ml  Net 2021.64 ml   Filed Weights   04/15/24 1728  Weight: 118 kg    Examination: General: young woman, obese, no acute distress HENT: [place]/AT, moist mucous membranes Lungs: scattered wheezing - mild Cardiovascular: rrr, no murmurs Abdomen: soft, mildly tender Extremities: warm, no edema Neuro: alert, oriented moving all extremities GU: n/a  Bedside US  6/2 Moderate sized left pleural effusion  Resolved problem list   Assessment and Plan   Sepsis due to pancreatitis Left Partially Loculated Pleural Effusion  Anemia Thrombocytosis  Plan: - Bedside US  shows left pleural effusion, moderate in size - consulted IR for placement of chest tube given patient's body habitus making difficulty for bedside placement.  - Plan to send pleural fluid studies: cell count/differential, gram stain/culture, cytology, pleural fluid LDH, total protein - effusion could be coming from abdominal process with pancreatitis vs parapneumonic effusion given recent pneumonia - agree with doxycycline and augmentin at this time - CRP, ESR both elevated - continue anoro inhaler for asthma, avoiding inhaled steroids to due possible infection - use as needed albuterol   PCCM will continue to follow    Best Practice (right click and "Reselect all SmartList Selections" daily)   Per primary  Labs   CBC: Recent Labs  Lab 04/15/24 1731 04/16/24 0631 04/17/24 0703  WBC 19.6* 16.2* 19.7*  HGB 12.8 10.9* 11.0*  HCT 39.5 33.8* 34.3*  MCV 84.6 84.7 85.1  PLT  566* 505* 497*    Basic Metabolic Panel: Recent Labs  Lab 04/15/24 1731 04/16/24 0631 04/16/24 0924 04/17/24 0703  NA 136 136  --  134*  K 3.4* 3.4*  --  3.5  CL 101 103  --  102  CO2 22 22  --  22  GLUCOSE 106* 108*  --  93  BUN 13 12  --  10  CREATININE 0.92 0.93  --  0.85  CALCIUM 9.1 8.6*  --  8.4*  MG  --   --  1.8 1.6*  PHOS  --   --  4.3 4.9*   GFR: Estimated  Creatinine Clearance: 116.4 mL/min (by C-G formula based on SCr of 0.85 mg/dL). Recent Labs  Lab 04/15/24 1731 04/15/24 2020 04/16/24 0631 04/17/24 0703  PROCALCITON  --  0.75  --   --   WBC 19.6*  --  16.2* 19.7*    Liver Function Tests: Recent Labs  Lab 04/15/24 1731 04/17/24 0703  AST 23 12*  ALT 26 21  ALKPHOS 83 91  BILITOT 0.5 0.5  PROT 6.7 6.0*  ALBUMIN 2.4* 2.2*   Recent Labs  Lab 04/15/24 1731 04/17/24 0703  LIPASE 146* 135*   No results for input(s): "AMMONIA" in the last 168 hours.  ABG No results found for: "PHART", "PCO2ART", "PO2ART", "HCO3", "TCO2", "ACIDBASEDEF", "O2SAT"   Coagulation Profile: No results for input(s): "INR", "PROTIME" in the last 168 hours.  Cardiac Enzymes: No results for input(s): "CKTOTAL", "CKMB", "CKMBINDEX", "TROPONINI" in the last 168 hours.  HbA1C: Hgb A1c MFr Bld  Date/Time Value Ref Range Status  05/30/2021 01:55 PM 5.7 (H) 4.8 - 5.6 % Final    Comment:    (NOTE) Pre diabetes:          5.7%-6.4%  Diabetes:              >6.4%  Glycemic control for   <7.0% adults with diabetes     CBG: No results for input(s): "GLUCAP" in the last 168 hours.     Critical care time: n/a    Duaine German, MD [facility] Office: [phone]   See Amion for personal pager PCCM on call pager [phone] until 7pm. Please call Elink 7p-7a. [phone]

## 2024-04-17 NOTE — Plan of Care (Signed)
  Problem: Clinical Measurements: Goal: Ability to maintain clinical measurements within normal limits will improve Outcome: Progressing   Problem: Clinical Measurements: Goal: Respiratory complications will improve Outcome: Progressing   Problem: Activity: Goal: Risk for activity intolerance will decrease Outcome: Progressing   Problem: Nutrition: Goal: Adequate nutrition will be maintained Outcome: Progressing   Problem: Coping: Goal: Level of anxiety will decrease Outcome: Progressing   Problem: Activity: Goal: Ability to tolerate increased activity will improve Outcome: Progressing

## 2024-04-17 NOTE — Progress Notes (Signed)
 [facility] Progress Note  Patient: Katrina Lambert    BJY:[id number]  DOA: 04/15/2024     Date of Service: the patient was seen and examined on 04/17/2024  Chief Complaint  Patient presents with   Pneumonia   Brief hospital course: Katrina Lambert is a 38 y.o. female with medical history significant for anxiety and depression, bipolar disorder, GERD, morbid obesity, prediabetes, tobacco use disorder, cervical myelopathy who presented to the ED for evaluation of dyspnea on exertion, cough and back pain. Patient reports she was bitten by a spider at the beginning of May, was admitted for acute pancreatitis and abscess of her left thigh on May 3 at [facility]. She was then admitted for left lower lobe pneumonia and had a 3-day hospitalization at [facility] 2 weeks ago. Since discharge from the hospital she has not felt completely well even after finishing the oral antibiotics.  He continues to have a dry cough that is occasionally productive as well as mild dyspnea on exertion.  She endorses left flank and LUQ pain with associated nausea, vomiting and occasional headache. She also reports back pain described as sharp. States she has an IUD in place but for the last 2 weeks, she has noticed some blood after urinating but unsure if this is due to hematuria or vaginal bleed.  She denies any chest pain, constipation, shortness of breath at rest, palpitations, dysuria or leg swelling.   ED Course: Initial vitals showed temp 98.6, RR 24, HR 114, BP 158/97, SpO2 95% on room air. Initial vitals significant for WBC 19.6, Hgb 12.8, platelet 566,K+ 3.4, normal creatinine, albumin 2.4, lipase of 46, negative UA and pregnancy test. Chest x-ray shows normal sinus. CXR shows moderate left pleural effusion with bibasilar atelectasis or infiltrate, possible pneumonia. Sepsis protocol was activated and patient received IV LR 1 L bolus, IV vancomycin , IV cefepime, IV Dilaudid , IV Toradol  and Percocet 5-325 mg x 1. TRH  was consulted for admission.   Assessment and Plan:  # Sepsis due to HCAP - p/w multiple complaints including cough, dyspnea on exertion, abdominal pain, back pain, nausea and vomiting - Met sepsis criteria with tachycardia, tachypnea, leukocytosis and evidence of respiratory and intra-abdominal infection -s/p IV vancomycin , cefepime and Flagyl. S/p IV NS 150 cc/h 6/1 ID consulted, DC'd IV antibiotics and transition to Augmentin twice daily and Doxycycline twice daily for 7 days - Follow-up blood culture - Trend CBC, fever curve   # HCAP - Recent hospitalization 2 weeks ago for left lower lobe pneumonia - Continues to have persistent respiratory symptoms - CXR shows possible bibasilar infiltrate, WBC 19.6, procalcitonin elevated to 0.75 - s/p IV vancomycin  and cefepime ID consulted, started Augmentin and doxycycline as above - Check MRSA screen, urinary Legionella and strep pneumo - Follow-up blood cultures and sputum culture Follow PCCM due to recurrent pneumonia and pleural effusion, patient may need thoracentesis? Follow respiratory viral panel  # Pleural effusion, Left - CXR shows moderate left pleural effusion however CT of the chest shows small left pleural effusion which is partially loculated in the upper hemothorax - No significant respiratory distress - Supplemental O2 as needed Pulmonary consulted, for thoracentesis vs chest tube insertion 6/2 patient is scheduled for chest tube insertion, follow fluid studies   # Acute pancreatitis ass with multiple fluid collections most likely pseudocyst - Patient admitted for acute pancreatitis a month ago at [facility] healthcare - Now presenting with LUQ pain, back pain with associated nausea and vomiting - CT of  the abdomen shows evidence of acute pancreatitis with multiple enhancing fluid collections in the LUQ adjacent to the spleen, stomach and pancreatic tail concerning for abscesses. - Lipase elevated to 146 - Patient  nontoxic-appearing but with Mild LUQ tenderness and L CVA tenderness - s/p IV hydration and broad-spectrum IV antibiotics as above - General Surgery consulted, recommended to hold off until need any surgical intervention, D/w IR, most likely pseudocyst, no need of drain insertion.   GI consulted, recommended continue symptomatic treatment and to pseudocyst mature and patient may need drainage in future if becomes symptomatic.  May not need antibiotics for pseudocyst. - Pain control with PRN Robaxin  and IV Dilaudid      # HTN - Continue amlodipine , lisinopril  and hydrochlorothiazide  Monitor BP and titrate medication accordingly # PCOS # Prediabetes - Continue metformin  # Hypomagnesemia, mag repleted. Monitor electrolytes daily and replete as needed.  # Iron deficiency, transferrin saturation 9%, avoided IV iron due to active infection.  Started oral iron supplement with vitamin C Follow-up with PCP to repeat iron profile after 3 to 6 months  # Folic acid deficiency, started folic acid 1 mg p.o. daily Follow with PCP to repeat folic acid level after 3 to 6 months  # Vitamin D deficiency: started vitamin D 50,000 units p.o. weekly, follow with PCP to repeat vitamin D level after 3 to 6 months.  # Vitamin B12 deficiency: Started vitamin B12 1000 mcg IM injection daily during hospital stay, followed by oral supplement.  Follow-up PCP to repeat vitamin B12 level after 3 to 6 months.   # Tobacco use disorder - Reports she has been smoking cigarettes since the age of 38 - Increased to 2 packs/day over the last 6 years - Has not smoke over the last 3 days - Smoking cessation counseling done, continue nicotine  patch    # Class III obesity Body mass index is 43.29 kg/m.  Interventions:  Pressure Injury 06/06/21 Buttocks Medial Stage 1 -  Intact skin with non-blanchable redness of a localized area usually over a bony prominence. Red (Active)  06/06/21 2030  Location: Buttocks  Location  Orientation: Medial  Staging: Stage 1 -  Intact skin with non-blanchable redness of a localized area usually over a bony prominence.  Wound Description (Comments): Red  Present on Admission: Yes     Diet: Heart healthy diet DVT Prophylaxis: Subcutaneous Lovenox   Advance goals of care discussion: Full code  Family Communication: family was present at bedside, at the time of interview.  The pt provided permission to discuss medical plan with the family. Opportunity was given to ask question and all questions were answered satisfactorily.   Disposition:  Pt is from home, admitted with sepsis due to pneumonia and abdominal pain secondary to pseudocyst due to pancreatitis, still has pain and left pleural effusion, which precludes a safe discharge. Discharge to home, when stable and cleared by pulmonary and GI.  Subjective: No significant events overnight, patient still has significant pain in the left upper quadrant and back which was waking her up in the middle of the night sometimes and she was complaining of 8/10 pain when she woke up in the morning.  Denied any worsening of shortness of breath, no any other complaints. Patient is aware that she needs chest insertion.   Physical Exam: General: NAD, lying comfortably Appear in no distress, affect appropriate Eyes: PERRLA ENT: Oral Mucosa Clear, moist  Neck: no JVD,  Cardiovascular: S1 and S2 Present, no Murmur,  Respiratory: Equal  air entry bilaterally, decreased breath sounds on left lower chest and mild crackles, no wheezes.  Abdomen: Bowel Sound present, Soft and LUQ tenderness, left-sided back tenderness Skin: no rashes Extremities: no Pedal edema, no calf tenderness Neurologic: without any new focal findings Gait not checked due to patient safety concerns  Vitals:   04/16/24 2146 04/17/24 0454 04/17/24 0738 04/17/24 0749  BP: (!) 140/104 (!) 144/89 130/87   Pulse: (!) 102 92 94 97  Resp: 18 16 16    Temp: 98.4 F (36.9 C)  98.3 F (36.8 C) 98.5 F (36.9 C)   TempSrc: Oral Oral Oral   SpO2: 91% 93% 92% 97%  Weight:      Height:        Intake/Output Summary (Last 24 hours) at 04/17/2024 1516 Last data filed at 04/17/2024 1204 Gross per 24 hour  Intake 2521.64 ml  Output 500 ml  Net 2021.64 ml   Filed Weights   04/15/24 1728  Weight: 118 kg    Data Reviewed: I have personally reviewed and interpreted daily labs, tele strips, imagings as discussed above. I reviewed all nursing notes, pharmacy notes, vitals, pertinent old records I have discussed plan of care as described above with RN and patient/family.  CBC: Recent Labs  Lab 04/15/24 1731 04/16/24 0631 04/17/24 0703  WBC 19.6* 16.2* 19.7*  HGB 12.8 10.9* 11.0*  HCT 39.5 33.8* 34.3*  MCV 84.6 84.7 85.1  PLT 566* 505* 497*   Basic Metabolic Panel: Recent Labs  Lab 04/15/24 1731 04/16/24 0631 04/16/24 0924 04/17/24 0703  NA 136 136  --  134*  K 3.4* 3.4*  --  3.5  CL 101 103  --  102  CO2 22 22  --  22  GLUCOSE 106* 108*  --  93  BUN 13 12  --  10  CREATININE 0.92 0.93  --  0.85  CALCIUM 9.1 8.6*  --  8.4*  MG  --   --  1.8 1.6*  PHOS  --   --  4.3 4.9*    Studies: No results found.   Scheduled Meds:  acetaminophen   650 mg Oral TID   amLODipine   10 mg Oral q morning   amoxicillin-clavulanate  1 tablet Oral Q12H   ascorbic acid  500 mg Oral Daily   cyanocobalamin  1,000 mcg Intramuscular Daily   Followed by   Cecily Cohen ON 04/23/2024] vitamin B-12  1,000 mcg Oral Daily   doxycycline  100 mg Oral Q12H   enoxaparin (LOVENOX) injection  40 mg Subcutaneous Q24H   folic acid  1 mg Oral Daily   hydrochlorothiazide   12.5 mg Oral Daily   iron polysaccharides  150 mg Oral Daily   ketorolac   15 mg Intravenous Q8H   lisinopril   20 mg Oral Daily   metFORMIN  500 mg Oral BID WC   mupirocin ointment   Topical BID   nicotine   21 mg Transdermal Daily   potassium chloride  20 mEq Oral Daily   umeclidinium-vilanterol  1 puff Inhalation  Daily   Vitamin D (Ergocalciferol)  50,000 Units Oral Q7 days   Continuous Infusions:   PRN Meds: HYDROmorphone  (DILAUDID ) injection, ketorolac  **FOLLOWED BY** [START ON 04/19/2024] ketorolac , methocarbamol , ondansetron  **OR** ondansetron  (ZOFRAN ) IV, oxyCODONE , senna-docusate  Time spent: 40 minutes  Author: Althia Atlas. MD [facility] 04/17/2024 3:16 PM  To reach On-call, see care teams to locate the attending and reach out to them via [email or web address]. If 7PM-7AM, please contact night-coverage If you still have  difficulty reaching the attending provider, please page the [facility] (Director on Call) for [facility] on amion for assistance.

## 2024-04-17 NOTE — Progress Notes (Signed)
 Inpatient Progress Note     Patient Profile/Chief Complaint  38 year old female admitted with sepsis, pneumonia, acute pancreatitis with evolving pseudocysts.  Pancreatitis was initially diagnosed and managed at [facility] 03/18/2024.  Etiology of pancreatitis remains unclear and currently idiopathic (normal triglyceride and calcium, no family history of pancreatic disease/cancer, no gallstones).  Patient was on hydrochlorothiazide  been off for many years making this seem less likely.  Ad[facility] with cough, fever, abdominal pain, nausea and vomiting diagnosed with community-acquired pneumonia   Interval History   -- Evaluated by IR -fluid collections unlikely to represent abscesses and more likely pseudocysts  -- Clinically stable overnight without any acute events -- Reports that her pain is stable -- Tolerated bites of a sandwich and reviewed importance of nutrition in the setting of pancreatitis     Objective   Vital signs in last 24 hours: Temp:  [97.4 F (36.3 C)-98.5 F (36.9 C)] 98.5 F (36.9 C) (06/02 0738) Pulse Rate:  [92-102] 97 (06/02 0749) Resp:  [16-18] 16 (06/02 0738) BP: (130-144)/(80-104) 130/87 (06/02 0738) SpO2:  [91 %-97 %] 97 % (06/02 0749) Last BM Date : 04/15/24 General:    Alert, resting in bed, no acute distress Heart:  Regular rate and rhythm; no murmurs Lungs: Respirations even and unlabored, lungs CTA bilaterally Abdomen:  Soft, mildly tender to palpation in the mid abdomen and left upper quadrant and nondistended. Normal bowel sounds. Extremities:  Without edema. Neurologic:  Alert and oriented,  grossly normal neurologically. Psych:  Cooperative. Normal mood and affect.  Intake/Output from previous day: 06/01 0701 - 06/02 0700 In: 2521.6 [P.O.:420; I.V.:2101.6] Out: -  Intake/Output this shift: Total I/O In: -  Out: 500 [Urine:500]  Lab Results: Recent Labs    04/15/24 1731 04/16/24 0631 04/17/24 0703  WBC 19.6*  16.2* 19.7*  HGB 12.8 10.9* 11.0*  HCT 39.5 33.8* 34.3*  PLT 566* 505* 497*   BMET Recent Labs    04/15/24 1731 04/16/24 0631 04/17/24 0703  NA 136 136 134*  K 3.4* 3.4* 3.5  CL 101 103 102  CO2 22 22 22   GLUCOSE 106* 108* 93  BUN 13 12 10   CREATININE 0.92 0.93 0.85  CALCIUM 9.1 8.6* 8.4*   LFT Recent Labs    04/17/24 0703  PROT 6.0*  ALBUMIN 2.2*  AST 12*  ALT 21  ALKPHOS 91  BILITOT 0.5  BILIDIR 0.1  IBILI 0.4   PT/INR No results for input(s): "LABPROT", "INR" in the last 72 hours.  Studies/Results: CT CHEST ABDOMEN PELVIS W CONTRAST Result Date: 04/15/2024 CLINICAL DATA:  Left flank pain and hematuria with sepsis and shortness of breath. EXAM: CT CHEST, ABDOMEN, AND PELVIS WITH CONTRAST TECHNIQUE: Multidetector CT imaging of the chest, abdomen and pelvis was performed following the standard protocol during bolus administration of intravenous contrast. RADIATION DOSE REDUCTION: This exam was performed according to the departmental dose-optimization program which includes automated exposure control, adjustment of the mA and/or kV according to patient size and/or use of iterative reconstruction technique. CONTRAST:  75mL OMNIPAQUE IOHEXOL 350 MG/ML SOLN COMPARISON:  CT abdomen and pelvis 12/07/2023. Report only CT angiogram chest 07/01/2023. FINDINGS: CT CHEST FINDINGS Cardiovascular: No significant vascular findings. Normal heart size. No pericardial effusion. Mediastinum/Nodes: No enlarged mediastinal, hilar, or axillary lymph nodes. Thyroid gland, trachea, and esophagus demonstrate no significant findings. Lungs/Pleura: There is a small left pleural effusion which is partially loculated in the upper hemithorax. There is compressive atelectasis of the left lower lobe.  The right lung is clear. There is no pneumothorax. Musculoskeletal: Cervical spinal fusion hardware is present. No acute bony abnormality. CT ABDOMEN PELVIS FINDINGS Hepatobiliary: The liver is enlarged. No focal  liver lesion identified. Gallbladder surgically absent. No biliary ductal dilatation. Pancreas: There is inflammatory stranding surrounding the pancreatic tail. There is a focal hypodensity in the pancreatic tail measuring 1.5 by 0.5 cm which is new from prior. The remaining pancreas appears within normal limits. Spleen: Spleen is normal in size and enhances normally. Low-density peripherally enhancing subcapsular fluid collection seen adjacent to the spleen measuring 3.1 x 8.5 by 4.5 cm. No splenic laceration. Adrenals/Urinary Tract: Adrenal glands are unremarkable. Kidneys are normal, without renal calculi, focal lesion, or hydronephrosis. Bladder is unremarkable. Stomach/Bowel: There is enhancing fluid collection adjacent to the fundus of the stomach and adjacent liver measuring 2.5 x 2.0 x 4.7 cm. There is a second enhancing fluid collection adjacent to the greater curvature of the stomach with adjacent stomach wall thickening measuring 3.2 x 3.0 x 3.7 cm. The stomach is nondilated. No other dilated bowel loops are visualized. The appendix is within normal limits. Vascular/Lymphatic: Aortic atherosclerosis. No enlarged abdominal or pelvic lymph nodes. Reproductive: There is an IUD in the uterus. Adnexa are within normal limits. Other: There is no ascites or focal abdominal wall hernia. There is stranding and a small amount of fluid in the left upper quadrant. Enhancing fluid collection in the left upper quadrant measures 3.4 x 2.0 by 8.7 cm image 2/59. There is no free intraperitoneal air or focal abdominal wall hernia. 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 There is grade 1 anterolisthesis and severe degenerative changes at L3-L4. IMPRESSION: 1. Findings compatible with acute pancreatitis involving the pancreatic tail. 2. There are multiple enhancing fluid collections in the left upper quadrant adjacent to the spleen, stomach, and pancreatic tail worrisome for abscesses. The  largest collection is in the left upper quadrant measuring 8.7 cm. 3. Small left pleural effusion which is partially loculated in the upper hemithorax. 4. Left lower lobe atelectasis. 5. Hepatomegaly. 6. Aortic atherosclerosis. Aortic Atherosclerosis (ICD10-I70.0). Electronically Signed   By: Tyron Gallon M.D.   On: 04/15/2024 23:16   DG Chest 2 View Result Date: 04/15/2024 CLINICAL DATA:  Cough. Left back pain. Shortness of breath. Pneumonia diagnosis 1 month ago. EXAM: CHEST - 2 VIEW COMPARISON:  07/05/2023 FINDINGS: Shallow inspiration. Heart size and pulmonary vascularity are normal for technique. There is a moderate left pleural effusion with basilar atelectasis or infiltration, possibly indicating pneumonia. Right lung is clear. No pneumothorax. Mediastinal contours appear intact. Postoperative changes in the cervical spine. Inferior pedicular screws appear fractured, unchanged since prior study. Surgical clips in the right upper quadrant. IMPRESSION: Moderate left pleural effusion with basilar atelectasis or infiltration, possibly pneumonia. Electronically Signed   By: Boyce Byes M.D.   On: 04/15/2024 19:15    Endoscopic Studies: None   Clinical Impression   38 year old female admitted with sepsis, pneumonia, acute pancreatitis with evolving pseudocysts.  Pancreatitis was initially diagnosed and managed at [facility] 03/18/2024.  Etiology of pancreatitis remains unclear and currently idiopathic (normal triglyceride and calcium, no family history of pancreatic disease/cancer, no gallstones).  Patient was on hydrochlorothiazide  been off for many years making this seem less likely.  Admitted to [facility] with cough, fever, abdominal pain, nausea and vomiting diagnosed with community-acquired pneumonia.  Reviewed results with patient today and discussed the slow and protracted nature of recovery from pancreatitis complicated by fluid collections/pseudocyst.  These collections are not  currently mature enough to be amenable to EUS guided drainage.  Discussed that they could contribute to symptoms by compressing the stomach which may impact nutrition and appetite.   Plan  Continue to monitor temperature curve Continue supportive management with IV fluids, analgesics and antiemetics No indication for EUS guided drainage at this time given lack of maturity of pseudocyst RD consult requested today given need to ensure adequate nutrition in the setting of pancreatitis -discussed that if she is not able to take adequate intake p.o. may need to consider the placement of a core track Antibiotic management deferred to the primary team targeted towards treatment of pneumonia    LOS: 2 days   Katrina Lambert  04/17/2024, 3:37 PM  Eugenia Hess, MD [facility]

## 2024-04-18 ENCOUNTER — Inpatient Hospital Stay (HOSPITAL_COMMUNITY): Payer: MEDICAID

## 2024-04-18 DIAGNOSIS — E43 Unspecified severe protein-calorie malnutrition: Secondary | ICD-10-CM | POA: Insufficient documentation

## 2024-04-18 DIAGNOSIS — K8500 Idiopathic acute pancreatitis without necrosis or infection: Secondary | ICD-10-CM | POA: Diagnosis not present

## 2024-04-18 DIAGNOSIS — J189 Pneumonia, unspecified organism: Secondary | ICD-10-CM | POA: Diagnosis not present

## 2024-04-18 DIAGNOSIS — A419 Sepsis, unspecified organism: Secondary | ICD-10-CM | POA: Diagnosis not present

## 2024-04-18 DIAGNOSIS — F17210 Nicotine dependence, cigarettes, uncomplicated: Secondary | ICD-10-CM | POA: Diagnosis not present

## 2024-04-18 LAB — CBC
HCT: 33.7 % — ABNORMAL LOW (ref 36.0–46.0)
Hemoglobin: 10.9 g/dL — ABNORMAL LOW (ref 12.0–15.0)
MCH: 27.4 pg (ref 26.0–34.0)
MCHC: 32.3 g/dL (ref 30.0–36.0)
MCV: 84.7 fL (ref 80.0–100.0)
Platelets: 529 10*3/uL — ABNORMAL HIGH (ref 150–400)
RBC: 3.98 MIL/uL (ref 3.87–5.11)
RDW: 13.3 % (ref 11.5–15.5)
WBC: 17.8 10*3/uL — ABNORMAL HIGH (ref 4.0–10.5)
nRBC: 0 % (ref 0.0–0.2)

## 2024-04-18 LAB — BASIC METABOLIC PANEL WITH GFR
Anion gap: 9 (ref 5–15)
BUN: 11 mg/dL (ref 6–20)
CO2: 24 mmol/L (ref 22–32)
Calcium: 8.7 mg/dL — ABNORMAL LOW (ref 8.9–10.3)
Chloride: 103 mmol/L (ref 98–111)
Creatinine, Ser: 0.85 mg/dL (ref 0.44–1.00)
GFR, Estimated: >60 mL/min (ref >60)
Glucose, Bld: 107 mg/dL — ABNORMAL HIGH (ref 70–99)
Potassium: 3.6 mmol/L (ref 3.5–5.1)
Sodium: 136 mmol/L (ref 135–145)

## 2024-04-18 LAB — HEPATIC FUNCTION PANEL
ALT: 15 U/L (ref 0–44)
AST: 12 U/L — ABNORMAL LOW (ref 15–41)
Albumin: 2.1 g/dL — ABNORMAL LOW (ref 3.5–5.0)
Alkaline Phosphatase: 73 U/L (ref 38–126)
Bilirubin, Direct: 0.1 mg/dL (ref 0.0–0.2)
Indirect Bilirubin: 0.3 mg/dL (ref 0.3–0.9)
Total Bilirubin: 0.4 mg/dL (ref 0.0–1.2)
Total Protein: 5.9 g/dL — ABNORMAL LOW (ref 6.5–8.1)

## 2024-04-18 LAB — MAGNESIUM: Magnesium: 1.7 mg/dL (ref 1.7–2.4)

## 2024-04-18 LAB — LIPASE, BLOOD: Lipase: 122 U/L — ABNORMAL HIGH (ref 11–51)

## 2024-04-18 LAB — PHOSPHORUS: Phosphorus: 4.9 mg/dL — ABNORMAL HIGH (ref 2.5–4.6)

## 2024-04-18 MED ORDER — FENTANYL CITRATE (PF) 100 MCG/2ML IJ SOLN
INTRAMUSCULAR | Status: AC | PRN
  Administered 2024-04-18: 50 ug via INTRAVENOUS

## 2024-04-18 MED ORDER — MELATONIN 3 MG PO TABS
3.0000 mg | ORAL_TABLET | Freq: Every evening | ORAL | Status: DC | PRN
  Administered 2024-04-18 – 2024-04-25 (×8): 3 mg via ORAL
  Filled 2024-04-18 (×8): qty 1

## 2024-04-18 MED ORDER — THIAMINE MONONITRATE 100 MG PO TABS
100.0000 mg | ORAL_TABLET | Freq: Every day | ORAL | Status: DC
  Administered 2024-04-18 – 2024-04-25 (×8): 100 mg via ORAL
  Filled 2024-04-18 (×8): qty 1

## 2024-04-18 MED ORDER — FENTANYL CITRATE (PF) 100 MCG/2ML IJ SOLN
INTRAMUSCULAR | Status: AC
  Filled 2024-04-18: qty 2

## 2024-04-18 MED ORDER — MIDAZOLAM HCL 2 MG/2ML IJ SOLN
INTRAMUSCULAR | Status: AC | PRN
  Administered 2024-04-18 (×3): 1 mg via INTRAVENOUS

## 2024-04-18 MED ORDER — ADULT MULTIVITAMIN W/MINERALS CH
1.0000 | ORAL_TABLET | Freq: Every day | ORAL | Status: DC
  Administered 2024-04-18 – 2024-04-25 (×8): 1 via ORAL
  Filled 2024-04-18 (×8): qty 1

## 2024-04-18 MED ORDER — MIDAZOLAM HCL 2 MG/2ML IJ SOLN
INTRAMUSCULAR | Status: AC
  Filled 2024-04-18: qty 2

## 2024-04-18 MED ORDER — LIDOCAINE HCL 1 % IJ SOLN
10.0000 mL | Freq: Once | INTRAMUSCULAR | Status: AC
  Administered 2024-04-18: 10 mL via INTRADERMAL
  Filled 2024-04-18: qty 10

## 2024-04-18 NOTE — Procedures (Signed)
 Interventional Radiology Procedure Note  Procedure: CT guided left thoracostomy tube placement   Findings: Please refer to procedural dictation for full description. 14 Fr pigtail.  Complications: None immediate  Estimated Blood Loss: < 5 mL  Recommendations: Further management as per Pulmonology. IR will follow.   Creasie Doctor, MD

## 2024-04-18 NOTE — Progress Notes (Addendum)
 Initial Nutrition Assessment  DOCUMENTATION CODES:   Severe malnutrition in context of acute illness/injury, Morbid obesity  INTERVENTION:   When diet advanced: 48 hour calorie count to start on 6/4 Recommend low fat diet  Encourage PO intake as allows Ensure Enlive po BID, each supplement provides 350 kcal and 20 grams of protein. MVI with minerals daily 100 mg Thiamine  daily Continue Vitamin B12- repletion   Continue 50,000 units Vitamin D every 7 days x 8 weeks for deficiency followed by 1,000 units Vitamin D daily for maintenance  When PO intake increases Monitor magnesium, potassium, and phosphorus BID for at least 3 days, MD to replete as needed, as pt is at risk for refeeding syndrome   If continued poor PO intake recommend initiation of enteral feedings within 48 hours.   NUTRITION DIAGNOSIS:   Severe Malnutrition related to acute illness as evidenced by mild muscle depletion, energy intake < or equal to 50% for > or equal to 5 days, percent weight loss (20 lb, 7% weight loss in 1 month.).   GOAL:   Patient will meet greater than or equal to 90% of their needs   MONITOR:   PO intake, Diet advancement, Supplement acceptance, Labs, I & O's, Weight trends  REASON FOR ASSESSMENT:   Consult Assessment of nutrition requirement/status, Calorie Count  ASSESSMENT:  38 y.o Female with multiple admission within the last month for acute pancreatitis, abscess of her left thigh, and pneumonia. Presented with ongoing LUQ abdominal pain with nausea and vomiting, SOB, and back pain. Admitted for sepsis secondary to pneumonia and pancreatitis. PMH of anxiety and depression, bipolar disorder, GERD, obesity, prediabetes, tobacco use disorder.  5/31 - CT abd: shows evidence of acute pancreatitis with multiple enhancing fluid collections in the LUQ adjacent to the spleen, stomach and pancreatic tail concerning for abscesses.  6/3 - s/p Chest tube insertion   Pt originally admitted  03/17/2024-03/21/2024 at [facility] for acute pancreatitis and abscess on her left thigh. Treated with IV fluids and was taken to the OR to close left thigh abscess, discharged. Admitted again 5/15- 5/18 for pneumonia discharged with antibiotics and discharged. Found to have pneumonia and pancreatitis.   Pt sitting up in bed with family at bedside. Pt reports she first started noticing chest pain 2 months ago and thinks her PO intake started to decline since then but has dramatically declined within the last month due to her reoccurant pancreatitis. Before her first diagnosis of pancreatitis in May pt reports no issues with eating and typically had a big dinner with snacking throughout the day.   Since the beginning of May pt reports nausea and vomiting after eating. She has only been able to take bites of meals before feeling full or nauseated. She even states that the feeling of food in her mouth made her sick. Has been able to keep down foods like water and jello. Pt endorses recent weight loss. Reports weighing 280 lbs 1 month ago and is now down to 260 lbs. 20 lb, 7% weight loss in 1 month.   Reports having an appetite and wants to eat but was only taking bites of food here  d/t having nausea but no emesis. NPO today for chest tube insertion. Discussed pancreatitis nutrition therapy with pt. Pt has had poor PO intake for 1 month now, MD already spoke to pt about possibility of feeding tube. RD also mentioned feeding tube and Pt did not seem interested. Amendable to ONS when diet allows. MD wants to  start calorie count.   Admit weight: 118 kg  Current weight: 118 kg   Average Meal Intake: 6/1: 50% x 2 meals 6/2: 75% x 1 meal  Nutritionally Relevant Medications: Scheduled Meds:  acetaminophen   650 mg Oral TID   amLODipine   10 mg Oral q morning   amoxicillin-clavulanate  1 tablet Oral Q12H   ascorbic acid  500 mg Oral Daily   cyanocobalamin  1,000 mcg Intramuscular Daily   Followed by    Cecily Cohen ON 04/23/2024] vitamin B-12  1,000 mcg Oral Daily   doxycycline  100 mg Oral Q12H   enoxaparin (LOVENOX) injection  40 mg Subcutaneous Q24H   folic acid  1 mg Oral Daily   hydrochlorothiazide   12.5 mg Oral Daily   iron polysaccharides  150 mg Oral Daily   ketorolac   15 mg Intravenous Q8H   lisinopril   20 mg Oral Daily   metFORMIN  500 mg Oral BID WC   mupirocin ointment   Topical BID   nicotine   21 mg Transdermal Daily   potassium chloride  20 mEq Oral Daily   umeclidinium-vilanterol  1 puff Inhalation Daily   Vitamin D (Ergocalciferol)  50,000 Units Oral Q7 days   Labs Reviewed: Calcium 8.7 Phosphorus 4.9 Albumin 2.1 Lipase 122 AST 12 Total protein 5.9  CRP 19.1 Vitamin D 14.04 Vitamin B12 80  CBG ranges from 93-107 mg/dL over the last 24 hours HgbA1c 5.7 (2022)   NUTRITION - FOCUSED PHYSICAL EXAM:  Flowsheet Row Most Recent Value  Orbital Region No depletion  Upper Arm Region No depletion  Thoracic and Lumbar Region No depletion  Buccal Region No depletion  Temple Region Mild depletion  Clavicle Bone Region No depletion  Clavicle and Acromion Bone Region No depletion  Scapular Bone Region No depletion  Patellar Region Mild depletion  Anterior Thigh Region Mild depletion  Posterior Calf Region Mild depletion  Edema (RD Assessment) None  Hair Reviewed  Eyes Reviewed  Mouth Reviewed  Skin Reviewed  Nails Reviewed         Diet Order:   Diet Order             Diet NPO time specified Except for: Sips with Meds  Diet effective midnight                   EDUCATION NEEDS:   Education needs have been addressed  Skin:  Skin Assessment: Skin Integrity Issues: Skin Integrity Issues:: Incisions Incisions: Left hip  Last BM:  04/17/2024  Height:   Ht Readings from Last 1 Encounters:  04/15/24 5\' 5"  (1.651 m)    Weight:   Wt Readings from Last 1 Encounters:  04/15/24 118 kg    Ideal Body Weight:  56.8 kg  BMI:  Body mass index is 43.29  kg/m.  Estimated Nutritional Needs:   Kcal:  2100-2300 kcal  Protein:  120-140 gm  Fluid:  >2L/day   Frederik Jansky, RD Registered Dietitian  See Amion for more information

## 2024-04-18 NOTE — Progress Notes (Signed)
 [facility]  Date of Admission:  04/15/2024     Reason for Follow Up: Sepsis due to pneumonia [facility])  Total days of antibiotics 4         ASSESSMENT:  Katrina Lambert is scheduled for chest tube placement in the setting medium sized loculated pleural effusion and pancreatitis. Tolerating antibiotics with no adverse side effects. Discussed plan of care for chest tube placement to aid in source control and continued antibiotics with doxycycline and amoxicillin/clavulanate.  Blood cultures remain without growth in 3 days and will continue to monitor for bacteremia.  Continue universal/standard precautions.  Remaining medical and supportive care per internal medicine.  PLAN:  Continue current dose of doxycycline and amoxicillin/clavulanate Chest tube placement per interventional radiology. Monitor cultures for bacteremia. Standard/universal precautions. Remaining medical and supportive care per internal medicine.  Principal Problem:   Sepsis due to pneumonia [facility]) Active Problems:   Acute pancreatitis   Intra-abdominal abscess (HCC)   Pleural effusion on left   Hospital-acquired pneumonia   Tobacco use disorder   Upper abdominal pain   Nausea and vomiting in adult   Loss of appetite   Abnormal loss of weight    acetaminophen   650 mg Oral TID   amLODipine   10 mg Oral q morning   amoxicillin-clavulanate  1 tablet Oral Q12H   ascorbic acid  500 mg Oral Daily   cyanocobalamin  1,000 mcg Intramuscular Daily   Followed by   Cecily Cohen ON 04/23/2024] vitamin B-12  1,000 mcg Oral Daily   doxycycline  100 mg Oral Q12H   enoxaparin (LOVENOX) injection  40 mg Subcutaneous Q24H   folic acid  1 mg Oral Daily   hydrochlorothiazide   12.5 mg Oral Daily   iron polysaccharides  150 mg Oral Daily   ketorolac   15 mg Intravenous Q8H   lisinopril   20 mg Oral Daily   metFORMIN  500 mg Oral BID WC   mupirocin ointment   Topical BID   nicotine   21 mg Transdermal Daily    potassium chloride  20 mEq Oral Daily   umeclidinium-vilanterol  1 puff Inhalation Daily   Vitamin D (Ergocalciferol)  50,000 Units Oral Q7 days    SUBJECTIVE:  Afebrile overnight with no acute events and stable leukocytosis.  Plan for chest tube today via interventional radiology.  No change in symptoms and tolerating antibiotics with no adverse side effects.  No Known Allergies   Review of Systems: Review of Systems  Constitutional:  Negative for chills, fever and weight loss.  Respiratory:  Negative for cough, shortness of breath and wheezing.   Cardiovascular:  Positive for chest pain (left sided chest pain (unchanged)). Negative for leg swelling.  Gastrointestinal:  Negative for abdominal pain, constipation, diarrhea, nausea and vomiting.  Skin:  Negative for rash.      OBJECTIVE: Vitals:   04/17/24 1605 04/17/24 2044 04/18/24 0433 04/18/24 0720  BP: (!) 152/84 127/62 106/72 128/74  Pulse: (!) 101 96 93 89  Resp: 16 16 16    Temp: 98.1 F (36.7 C) 98.1 F (36.7 C) 98.2 F (36.8 C) 98.2 F (36.8 C)  TempSrc: Oral Oral  Oral  SpO2: 97% 93% 93% 94%  Weight:      Height:       Body mass index is 43.29 kg/m.  Physical Exam Constitutional:      General: She is not in acute distress.    Appearance: She is well-developed. She is obese.  Cardiovascular:  Rate and Rhythm: Normal rate and regular rhythm.     Heart sounds: Normal heart sounds.  Pulmonary:     Effort: Pulmonary effort is normal.     Breath sounds: Normal breath sounds.  Skin:    General: Skin is warm and dry.  Neurological:     Mental Status: She is alert and oriented to person, place, and time.     Lab Results Lab Results  Component Value Date   WBC 17.8 (H) 04/18/2024   HGB 10.9 (L) 04/18/2024   HCT 33.7 (L) 04/18/2024   MCV 84.7 04/18/2024   PLT 529 (H) 04/18/2024    Lab Results  Component Value Date   CREATININE 0.85 04/18/2024   BUN 11 04/18/2024   NA 136 04/18/2024   K 3.6  04/18/2024   CL 103 04/18/2024   CO2 24 04/18/2024    Lab Results  Component Value Date   ALT 15 04/18/2024   AST 12 (L) 04/18/2024   ALKPHOS 73 04/18/2024   BILITOT 0.4 04/18/2024     Microbiology: Recent Results (from the past 240 hours)  Blood culture (routine x 2)     Status: None (Preliminary result)   Collection Time: 04/15/24  8:15 PM   Specimen: BLOOD RIGHT HAND  Result Value Ref Range Status   Specimen Description BLOOD RIGHT HAND  Final   Special Requests   Final    BOTTLES DRAWN AEROBIC AND ANAEROBIC Blood Culture results may not be optimal due to an inadequate volume of blood received in culture bottles   Culture   Final    NO GROWTH 3 DAYS Performed at [facility], [address], [place], [place] [address]    Report Status PENDING  Incomplete  Blood culture (routine x 2)     Status: None (Preliminary result)   Collection Time: 04/15/24  8:20 PM   Specimen: BLOOD LEFT ARM  Result Value Ref Range Status   Specimen Description BLOOD LEFT ARM  Final   Special Requests   Final    BOTTLES DRAWN AEROBIC AND ANAEROBIC Blood Culture results may not be optimal due to an inadequate volume of blood received in culture bottles   Culture   Final    NO GROWTH 3 DAYS Performed at [facility], [address], [place], [place] [address]    Report Status PENDING  Incomplete  MRSA Next Gen by PCR, Nasal     Status: None   Collection Time: 04/15/24 11:13 PM  Result Value Ref Range Status   MRSA by PCR Next Gen NOT DETECTED NOT DETECTED Final    Comment: (NOTE) The GeneXpert MRSA Assay (FDA approved for NASAL specimens only), is one component of a comprehensive MRSA colonization surveillance program. It is not intended to diagnose MRSA infection nor to guide or monitor treatment for MRSA infections. Test performance is not FDA approved in patients less than 86 years old. Performed at [facility], [address], [place], [place] [address]   Respiratory  (~20 pathogens) panel by PCR     Status: None   Collection Time: 04/16/24  1:59 PM   Specimen: Nasopharyngeal Swab; Respiratory  Result Value Ref Range Status   Adenovirus NOT DETECTED NOT DETECTED Final   Coronavirus 229E NOT DETECTED NOT DETECTED Final    Comment: (NOTE) The Coronavirus on the Respiratory Panel, DOES NOT test for the novel  Coronavirus (2019 nCoV)    Coronavirus HKU1 NOT DETECTED NOT DETECTED Final  Coronavirus NL63 NOT DETECTED NOT DETECTED Final   Coronavirus OC43 NOT DETECTED NOT DETECTED Final   Metapneumovirus NOT DETECTED NOT DETECTED Final   Rhinovirus / Enterovirus NOT DETECTED NOT DETECTED Final   Influenza A NOT DETECTED NOT DETECTED Final   Influenza B NOT DETECTED NOT DETECTED Final   Parainfluenza Virus 1 NOT DETECTED NOT DETECTED Final   Parainfluenza Virus 2 NOT DETECTED NOT DETECTED Final   Parainfluenza Virus 3 NOT DETECTED NOT DETECTED Final   Parainfluenza Virus 4 NOT DETECTED NOT DETECTED Final   Respiratory Syncytial Virus NOT DETECTED NOT DETECTED Final   Bordetella pertussis NOT DETECTED NOT DETECTED Final   Bordetella Parapertussis NOT DETECTED NOT DETECTED Final   Chlamydophila pneumoniae NOT DETECTED NOT DETECTED Final   Mycoplasma pneumoniae NOT DETECTED NOT DETECTED Final    Comment: Performed at [facility], [address], [place], [place] [address]   I have personally spent 26 minutes involved in face-to-face and non-face-to-face activities for this patient on the day of the visit. Professional time spent includes the following activities: Preparing to see the patient (review of tests), performing a medically appropriate examination and evaluation, managing medications,communicating with other health care professionals,documenting clinical information in the EMR, and care coordination.    Greg Kaleeyah Cuffie, NP [facility]  04/18/2024  12:40 PM

## 2024-04-18 NOTE — Progress Notes (Signed)
 [facility] Progress Note  Patient: Katrina Lambert    ZOX:[id number]  DOA: 04/15/2024     Date of Service: the patient was seen and examined on 04/18/2024  Chief Complaint  Patient presents with   Pneumonia   Brief hospital course: SURYA FOLDEN is a 38 y.o. female with medical history significant for anxiety and depression, bipolar disorder, GERD, morbid obesity, prediabetes, tobacco use disorder, cervical myelopathy who presented to the ED for evaluation of dyspnea on exertion, cough and back pain. Patient reports she was bitten by a spider at the beginning of May, was admitted for acute pancreatitis and abscess of her left thigh on May 3 at [facility]. She was then admitted for left lower lobe pneumonia and had a 3-day hospitalization at [facility] 2 weeks ago. Since discharge from the hospital she has not felt completely well even after finishing the oral antibiotics.  He continues to have a dry cough that is occasionally productive as well as mild dyspnea on exertion.  She endorses left flank and LUQ pain with associated nausea, vomiting and occasional headache. She also reports back pain described as sharp. States she has an IUD in place but for the last 2 weeks, she has noticed some blood after urinating but unsure if this is due to hematuria or vaginal bleed.  She denies any chest pain, constipation, shortness of breath at rest, palpitations, dysuria or leg swelling.   ED Course: Initial vitals showed temp 98.6, RR 24, HR 114, BP 158/97, SpO2 95% on room air. Initial vitals significant for WBC 19.6, Hgb 12.8, platelet 566,K+ 3.4, normal creatinine, albumin 2.4, lipase of 46, negative UA and pregnancy test. Chest x-ray shows normal sinus. CXR shows moderate left pleural effusion with bibasilar atelectasis or infiltrate, possible pneumonia. Sepsis protocol was activated and patient received IV LR 1 L bolus, IV vancomycin , IV cefepime, IV Dilaudid , IV Toradol  and Percocet 5-325 mg x 1. TR[facility]  was consulted for admission.   Assessment and Plan:  # Sepsis due to HCAP - p/w multiple complaints including cough, dyspnea on exertion, abdominal pain, back pain, nausea and vomiting - Met sepsis criteria with tachycardia, tachypnea, leukocytosis and evidence of respiratory and intra-abdominal infection -s/p IV vancomycin , cefepime and Flagyl. S/p IV NS 150 cc/h 6/1 ID consulted, DC'd IV antibiotics and transition to Augmentin twice daily and Doxycycline twice daily for 7 days - Follow-up blood culture - Trend CBC, fever curve   # HCAP - Recent hospitalization 2 weeks ago for left lower lobe pneumonia - Continues to have persistent respiratory symptoms - CXR shows possible bibasilar infiltrate, WBC 19.6, procalcitonin elevated to 0.75 - s/p IV vancomycin  and cefepime ID consulted, started Augmentin and doxycycline as above - Check MRSA screen, urinary Legionella and strep pneumo - Follow-up blood cultures and sputum culture Follow PCCM due to recurrent pneumonia and pleural effusion, patient may need thoracentesis? Follow respiratory viral panel  # Pleural effusion, Left - CXR shows moderate left pleural effusion however CT of the chest shows small left pleural effusion which is partially loculated in the upper hemothorax - No significant respiratory distress - Supplemental O2 as needed Pulmonary consulted, recommended chest tube insertion and consulted IR. 6/3 s/p CT guided left thoracostomy tube placement done by IR. Follow fluid studies and follow PCCM for further management   # Acute pancreatitis ass with multiple fluid collections most likely pseudocyst - Patient admitted for acute pancreatitis a month ago at [facility] - Now presenting with LUQ pain,  back pain with associated nausea and vomiting - CT of the abdomen shows evidence of acute pancreatitis with multiple enhancing fluid collections in the LUQ adjacent to the spleen, stomach and pancreatic tail concerning for  abscesses. - Lipase elevated to 146 - Patient nontoxic-appearing but with Mild LUQ tenderness and L CVA tenderness - s/p IV hydration and broad-spectrum IV antibiotics as above - General Surgery consulted, recommended to hold off until need any surgical intervention, D/w IR, most likely pseudocyst, no need of drain insertion.   GI consulted, recommended continue symptomatic treatment and to pseudocyst mature and patient may need drainage in future if becomes symptomatic.  May not need antibiotics for pseudocyst. - Pain control with PRN Robaxin  and IV Dilaudid      # HTN - Continue amlodipine , lisinopril  and hydrochlorothiazide  Monitor BP and titrate medication accordingly # PCOS # Prediabetes - Continue metformin  # Hypomagnesemia, mag repleted. Monitor electrolytes daily and replete as needed.  # Iron deficiency, transferrin saturation 9%, avoided IV iron due to active infection.  Started oral iron supplement with vitamin C Follow-up with PCP to repeat iron profile after 3 to 6 months  # Folic acid deficiency, started folic acid 1 mg p.o. daily Follow with PCP to repeat folic acid level after 3 to 6 months  # Vitamin D deficiency: started vitamin D 50,000 units p.o. weekly, follow with PCP to repeat vitamin D level after 3 to 6 months.  # Vitamin B12 deficiency: Started vitamin B12 1000 mcg IM injection daily during hospital stay, followed by oral supplement.  Follow-up PCP to repeat vitamin B12 level after 3 to 6 months.   # Tobacco use disorder - Reports she has been smoking cigarettes since the age of 51 - Increased to 2 packs/day over the last 6 years - Has not smoke over the last 3 days - Smoking cessation counseling done, continue nicotine  patch    # Class III obesity Body mass index is 43.29 kg/m.  Interventions:  Pressure Injury 06/06/21 Buttocks Medial Stage 1 -  Intact skin with non-blanchable redness of a localized area usually over a bony prominence. Red (Active)   06/06/21 2030  Location: Buttocks  Location Orientation: Medial  Staging: Stage 1 -  Intact skin with non-blanchable redness of a localized area usually over a bony prominence.  Wound Description (Comments): Red  Present on Admission: Yes     Diet: Heart healthy diet DVT Prophylaxis: Subcutaneous Lovenox   Advance goals of care discussion: Full code  Family Communication: family was present at bedside, at the time of interview.  The pt provided permission to discuss medical plan with the family. Opportunity was given to ask question and all questions were answered satisfactorily.   Disposition:  Pt is from home, admitted with sepsis due to pneumonia and abdominal pain secondary to pseudocyst due to pancreatitis, still has pain and left pleural effusion, which precludes a safe discharge. Discharge to home, when stable and cleared by pulmonary and GI.  Subjective: No significant events overnight, still has pain 5/10 on the left side and backside.  No respiratory distress, no chest pain or palpitations. Patient is n.p.o. since midnight for chest tube insertion by IR today.   Physical Exam: General: NAD, lying comfortably Appear in no distress, affect appropriate Eyes: PERRLA ENT: Oral Mucosa Clear, moist  Neck: no JVD,  Cardiovascular: S1 and S2 Present, no Murmur,  Respiratory: Equal air entry bilaterally, decreased breath sounds on left lower chest and mild crackles, no wheezes.  Abdomen:  Bowel Sound present, Soft and LUQ tenderness, left-sided back tenderness Skin: no rashes Extremities: no Pedal edema, no calf tenderness Neurologic: without any new focal findings Gait not checked due to patient safety concerns  Vitals:   04/18/24 1425 04/18/24 1430 04/18/24 1445 04/18/24 1542  BP: (!) 111/54 118/66 123/70 (!) 142/99  Pulse: 96 96 98 89  Resp: 17 18 18    Temp:    98.2 F (36.8 C)  TempSrc:    Oral  SpO2: 100% 99% 95% 97%  Weight:      Height:        Intake/Output  Summary (Last 24 hours) at 04/18/2024 1543 Last data filed at 04/18/2024 1454 Gross per 24 hour  Intake 50.38 ml  Output 250 ml  Net -199.62 ml   Filed Weights   04/15/24 1728  Weight: 118 kg    Data Reviewed: I have personally reviewed and interpreted daily labs, tele strips, imagings as discussed above. I reviewed all nursing notes, pharmacy notes, vitals, pertinent old records I have discussed plan of care as described above with RN and patient/family.  CBC: Recent Labs  Lab 04/15/24 1731 04/16/24 0631 04/17/24 0703 04/18/24 0548  WBC 19.6* 16.2* 19.7* 17.8*  HGB 12.8 10.9* 11.0* 10.9*  HCT 39.5 33.8* 34.3* 33.7*  MCV 84.6 84.7 85.1 84.7  PLT 566* 505* 497* 529*   Basic Metabolic Panel: Recent Labs  Lab 04/15/24 1731 04/16/24 0631 04/16/24 0924 04/17/24 0703 04/18/24 0548  NA 136 136  --  134* 136  K 3.4* 3.4*  --  3.5 3.6  CL 101 103  --  102 103  CO2 22 22  --  22 24  GLUCOSE 106* 108*  --  93 107*  BUN 13 12  --  10 11  CREATININE 0.92 0.93  --  0.85 0.85  CALCIUM 9.1 8.6*  --  8.4* 8.7*  MG  --   --  1.8 1.6* 1.7  PHOS  --   --  4.3 4.9* 4.9*    Studies: CT [facility] PLEURAL DRAIN W/INDWELL CATH W/IMG GUIDE Result Date: 04/18/2024 INDICATION: 38 year old female with history of left parapneumonic effusion. EXAM: CT PERC PLEURAL DRAIN W/INDWELL CATH W/IMG GUIDE COMPARISON:  None Available. MEDICATIONS: The patient is currently admitted to the hospital and receiving intravenous antibiotics. The antibiotics were administered within an appropriate time frame prior to the initiation of the procedure. ANESTHESIA/SEDATION: Moderate (conscious) sedation was employed during this procedure. A total of Versed  3 mg and Fentanyl  50 mcg was administered intravenously. Moderate Sedation Time: 13 minutes. The patient's level of consciousness and vital signs were monitored continuously by radiology nursing throughout the procedure under my direct supervision. CONTRAST:  None  COMPLICATIONS: None immediate. PROCEDURE: RADIATION DOSE REDUCTION: This exam was performed according to the departmental dose-optimization program which includes automated exposure control, adjustment of the mA and/or kV according to patient size and/or use of iterative reconstruction technique. Informed written consent was obtained from the patient after a discussion of the risks, benefits and alternatives to treatment. The patient was placed right lateral decubitus on the CT gantry and a pre procedural CT was performed re-demonstrating the known fluid collection within the left pleural space. The procedure was planned. A timeout was performed prior to the initiation of the procedure. The left posterolateral and inferior thorax was prepped and draped in the usual sterile fashion. The overlying soft tissues were anesthetized with 1% lidocaine  with epinephrine . Appropriate trajectory was planned with the use of a 22 gauge  spinal needle. An 18 gauge trocar needle was advanced into the pleural space and a short Amplatz super stiff wire was coiled within the collection. Appropriate positioning was confirmed with a limited CT scan. The tract was serially dilated allowing placement of a 14 French all-purpose drainage catheter. Appropriate positioning was confirmed with a limited postprocedural CT scan. A total of approximately 100 ml of translucent, straw-colored fluid was initially aspirated. The tube was connected to a pleurovac and sutured in place. A dressing was placed. The patient tolerated the procedure well without immediate post procedural complication. IMPRESSION: Successful CT guided placement of a 41 French all purpose drain catheter into the posterolateral and inferior left pleural space with aspiration of translucent, straw-colored fluid. Creasie Doctor, MD [facility] Electronically Signed   By: Creasie Doctor M.D.   On: 04/18/2024 15:02      Scheduled Meds:  acetaminophen   650 mg Oral TID   amLODipine   10 mg Oral q morning   amoxicillin-clavulanate  1 tablet Oral Q12H   ascorbic acid  500 mg Oral Daily   cyanocobalamin  1,000 mcg Intramuscular Daily   Followed by   Cecily Cohen ON 04/23/2024] vitamin B-12  1,000 mcg Oral Daily   doxycycline  100 mg Oral Q12H   enoxaparin (LOVENOX) injection  40 mg Subcutaneous Q24H   folic acid  1 mg Oral Daily   hydrochlorothiazide   12.5 mg Oral Daily   iron polysaccharides  150 mg Oral Daily   ketorolac   15 mg Intravenous Q8H   lisinopril   20 mg Oral Daily   metFORMIN  500 mg Oral BID WC   multivitamin with minerals  1 tablet Oral Daily   mupirocin ointment   Topical BID   nicotine   21 mg Transdermal Daily   potassium chloride  20 mEq Oral Daily   thiamine   100 mg Oral Daily   umeclidinium-vilanterol  1 puff Inhalation Daily   Vitamin D (Ergocalciferol)  50,000 Units Oral Q7 days   Continuous Infusions:   PRN Meds: HYDROmorphone  (DILAUDID ) injection, ketorolac  **FOLLOWED BY** [START ON 04/19/2024] ketorolac , methocarbamol , ondansetron  **OR** ondansetron  (ZOFRAN ) IV, oxyCODONE , senna-docusate  Time spent: 40 minutes  Author: Althia Atlas. MD [facility] 04/18/2024 3:43 PM  To reach On-call, see care teams to locate the attending and reach out to them via [email or web address]. If 7PM-7AM, please contact night-coverage If you still have difficulty reaching the attending provider, please page the [facility] (Director on Call) for [facility] on amion for assistance.

## 2024-04-18 NOTE — Progress Notes (Addendum)
 Inpatient Progress Note     Patient Profile/Chief Complaint  38 year old female admitted with sepsis, pneumonia, acute pancreatitis with evolving pseudocysts.  Pancreatitis was initially diagnosed and managed at [facility] 03/18/2024.  Etiology of pancreatitis remains unclear and currently idiopathic (normal triglyceride and calcium, no family history of pancreatic disease/cancer, no gallstones).  Patient was on hydrochlorothiazide  been off for many years making this seem less likely.  Admitted to [facility] with cough, fever, abdominal pain, nausea and vomiting diagnosed with community-acquired pneumonia  Evaluated by IR -fluid collections unlikely to represent abscesses and more likely pseudocysts     Interval History   -- Undergoing IR placement for chest tube for left pleural effusion today -- Clinically stable overnight without any acute events -- Reports that her pain is stable -- Reports diminished appetite and early satiety     Objective   Vital signs in last 24 hours: Temp:  [98.1 F (36.7 C)-98.2 F (36.8 C)] 98.2 F (36.8 C) (06/03 1542) Pulse Rate:  [89-98] 89 (06/03 1542) Resp:  [14-18] 18 (06/03 1445) BP: (106-142)/(54-99) 142/99 (06/03 1542) SpO2:  [93 %-100 %] 97 % (06/03 1542) Last BM Date : 04/17/24 General:    Alert, resting in bed, no acute distress Heart:  Regular rate and rhythm; no murmurs Lungs: Respirations even and unlabored, decreased breath sounds at left lung base Abdomen:  Soft, mildly tender to palpation in the mid abdomen and left upper quadrant and nondistended. Normal bowel sounds. Extremities:  Without edema. Neurologic:  Alert and oriented,  grossly normal neurologically. Psych:  Cooperative. Normal mood and affect.  Intake/Output from previous day: 06/02 0701 - 06/03 0700 In: 770.4 [P.O.:720; IV Piggyback:50.4] Out: 500 [Urine:500] Intake/Output this shift: Total I/O In: -  Out: 250 [Chest Tube:250]  Lab Results: Recent Labs     04/16/24 0631 04/17/24 0703 04/18/24 0548  WBC 16.2* 19.7* 17.8*  HGB 10.9* 11.0* 10.9*  HCT 33.8* 34.3* 33.7*  PLT 505* 497* 529*   BMET Recent Labs    04/16/24 0631 04/17/24 0703 04/18/24 0548  NA 136 134* 136  K 3.4* 3.5 3.6  CL 103 102 103  CO2 22 22 24   GLUCOSE 108* 93 107*  BUN 12 10 11   CREATININE 0.93 0.85 0.85  CALCIUM 8.6* 8.4* 8.7*   LFT Recent Labs    04/18/24 0548  PROT 5.9*  ALBUMIN 2.1*  AST 12*  ALT 15  ALKPHOS 73  BILITOT 0.4  BILIDIR 0.1  IBILI 0.3   PT/INR No results for input(s): "LABPROT", "INR" in the last 72 hours.  Studies/Results: CT [facility] PLEURAL DRAIN W/INDWELL CATH W/IMG GUIDE Result Date: 04/18/2024 INDICATION: 38 year old female with history of left parapneumonic effusion. EXAM: CT PERC PLEURAL DRAIN W/INDWELL CATH W/IMG GUIDE COMPARISON:  None Available. MEDICATIONS: The patient is currently admitted to the hospital and receiving intravenous antibiotics. The antibiotics were administered within an appropriate time frame prior to the initiation of the procedure. ANESTHESIA/SEDATION: Moderate (conscious) sedation was employed during this procedure. A total of Versed  3 mg and Fentanyl  50 mcg was administered intravenously. Moderate Sedation Time: 13 minutes. The patient's level of consciousness and vital signs were monitored continuously by radiology nursing throughout the procedure under my direct supervision. CONTRAST:  None COMPLICATIONS: None immediate. PROCEDURE: RADIATION DOSE REDUCTION: This exam was performed according to the departmental dose-optimization program which includes automated exposure control, adjustment of the mA and/or kV according to patient size and/or use of iterative reconstruction technique. Informed written consent was obtained  from the patient after a discussion of the risks, benefits and alternatives to treatment. The patient was placed right lateral decubitus on the CT gantry and a pre procedural CT was performed  re-demonstrating the known fluid collection within the left pleural space. The procedure was planned. A timeout was performed prior to the initiation of the procedure. The left posterolateral and inferior thorax was prepped and draped in the usual sterile fashion. The overlying soft tissues were anesthetized with 1% lidocaine  with epinephrine . Appropriate trajectory was planned with the use of a 22 gauge spinal needle. An 18 gauge trocar needle was advanced into the pleural space and a short Amplatz super stiff wire was coiled within the collection. Appropriate positioning was confirmed with a limited CT scan. The tract was serially dilated allowing placement of a 14 French all-purpose drainage catheter. Appropriate positioning was confirmed with a limited postprocedural CT scan. A total of approximately 100 ml of translucent, straw-colored fluid was initially aspirated. The tube was connected to a pleurovac and sutured in place. A dressing was placed. The patient tolerated the procedure well without immediate post procedural complication. IMPRESSION: Successful CT guided placement of a 62 French all purpose drain catheter into the posterolateral and inferior left pleural space with aspiration of translucent, straw-colored fluid. Creasie Doctor, MD [facility] Electronically Signed   By: Creasie Doctor M.D.   On: 04/18/2024 15:02    Endoscopic Studies: None   Clinical Impression   38 year old female admitted with sepsis, pneumonia, acute pancreatitis with evolving pseudocysts.  Pancreatitis was initially diagnosed and managed at [facility] 03/18/2024.  Etiology of pancreatitis remains unclear and currently idiopathic (normal triglyceride and calcium, no family history of pancreatic disease/cancer, no gallstones).  Patient was on hydrochlorothiazide  been off for many years making this seem less likely.  Admitted to [facility] with cough, fever,  abdominal pain, nausea and vomiting diagnosed with community-acquired pneumonia.  Reviewed results with patient today and discussed the slow and protracted nature of recovery from pancreatitis complicated by fluid collections/pseudocyst.  These collections are not currently mature enough to be amenable to EUS guided drainage.  Discussed that they could contribute to symptoms by compressing the stomach which may impact nutrition and appetite.  She underwent left chest tube placement 04/18/2024 for left-sided pleural effusion that could be due to pulmonary versus pancreatic etiology.  Fluid studies pending.   Plan  Continue to monitor temperature curve Continue supportive management with IV fluids, analgesics and antiemetics No indication for EUS guided drainage at this time given lack of maturity of pseudocyst Monitor oral intake-discussed that if she is not able to take adequate intake p.o. may need to consider the placement of a [place] Continue vitamin supplements Antibiotic management per ID targeted towards pulmonary process   LOS: 3 days   Truddie Furrow  04/18/2024, 4:24 PM  Eugenia Hess, MD [facility]

## 2024-04-18 NOTE — Consult Note (Signed)
 Chief Complaint: Partially loculated left pleural effusion - IR consulted for left chest tube placement  Referring Provider(s): Diania Fortes Francyne Islam, MD   Supervising Physician: Creasie Doctor  Patient Status: [facility] - In-pt  History of Present Illness: Katrina Lambert is a 38 y.o. female with pmhx of anxiety and depression, bipolar disorder, GERD, morbid obesity, prediabetes, tobacco use disorder, cervical myelopathy. Initially presented to ED on 5/31 for dyspnea on exertion, cough and left flank back pain. Was admitted for left lower lobe pneumonia at [facility] 2 weeks ago. Found to have leukocytosis 19.6. CXR from 5/31 with moderate left pleural effusion. Had CT chest abdomen pelvis 5/31 showing acute pancreatitis, multiple fluid collections in the left upper quadrant worrisome for infection, as well as small partially loculated left pleural effusion. 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 IR additionally consulted for left chest tube placement for loculated left pleural effusion, which we are planning on performing today.  Today pt with some left flank/left sided soreness particularly with deep breaths and movement. Otherwise no additional new complaints. Has been NPO since midnight.   Patient is Full Code  Past Medical History:  Diagnosis Date   Anxiety    Arthritis    bilateral hips and knees   Asthma    Bipolar disorder (HCC)    Depression    Dyspnea    GERD (gastroesophageal reflux disease)    Headache    Hypertension    Neuromuscular disorder (HCC)    PONV (postoperative nausea and vomiting)    Pre-diabetes     Past Surgical History:  Procedure Laterality Date   ANTERIOR CERVICAL DECOMP/DISCECTOMY FUSION N/A 06/02/2021   Procedure: ANTERIOR CERVICAL DISCECTOMY FUSION CERVICAL FOUR-FIVE, CERVICAL FIVE-SIX, CERVICAL SIX-SEVEN WITH CERVICAL SIX CORPECTOMY;  Surgeon:  Dawley, Colby Daub, DO;  Location: [facility] OR;  Service: Neurosurgery;  Laterality: N/A;   CESAREAN SECTION     x 2   CHOLECYSTECTOMY     POSTERIOR CERVICAL FUSION/FORAMINOTOMY N/A 06/06/2021   Procedure: CERVICAL FOUR-FIVE, CERVICAL FIVE-SIX, CERVICAL SIX-SEVEN, CERVICAL SEVEN-THORACIC ONE POSTERIOR CERVICAL INSTRUMENTATION AND FUSION WITH CERVICAL FOUR-CERVICAL SEVEN LAMINECTOMY/DECOMPRESSION;  Surgeon: Dawley, Colby Daub, DO;  Location: [facility] OR;  Service: Neurosurgery;  Laterality: N/A;    Allergies: Patient has no known allergies.  Medications: Prior to Admission medications   Medication Sig Start Date End Date Taking? Authorizing Provider  amLODipine  (NORVASC ) 10 MG tablet Take 10 mg by mouth every morning. 01/06/21  Yes [provider]  hydrochlorothiazide  (MICROZIDE ) 12.5 MG capsule Take 12.5 mg by mouth daily. 03/06/24  Yes [provider]  lisinopril  (ZESTRIL ) 20 MG tablet Take 20 mg by mouth daily. 03/06/24  Yes [provider]  metFORMIN (GLUCOPHAGE) 500 MG tablet Take 500 mg by mouth 2 (two) times daily with a meal. 04/04/24  Yes [provider]  potassium chloride (KLOR-CON M) 10 MEQ tablet Take 10 mEq by mouth daily. 03/06/24  Yes [provider]     History reviewed. No pertinent family history.  Social History   Socioeconomic History   Marital status: Married    Spouse name: Not on file   Number of children: Not on file   Years of education: Not on file   Highest education level: Not on file  Occupational History   Not on file  Tobacco Use   Smoking status: Every Day    Current packs/day: 1.50    Average packs/day: 1.5 packs/day  for 22.0 years (33.0 ttl pk-yrs)    Types: Cigarettes   Smokeless tobacco: Former  Building services engineer status: Never Used  Substance and Sexual Activity   Alcohol use: Yes    Comment: 2-3 a week   Drug use: Yes    Frequency: 7.0 times per week    Types: Marijuana    Comment: dailiy use   Sexual activity:  Not on file  Other Topics Concern   Not on file  Social History Narrative   Not on file   Social Drivers of Health   Financial Resource Strain: Low Risk  (03/18/2024)   Received from [facility]   Overall Financial Resource Strain (CARDIA)    Difficulty of Paying Living Expenses: Not very hard  Food Insecurity: No Food Insecurity (04/15/2024)   Hunger Vital Sign    Worried About Running Out of Food in the Last Year: Never true    Ran Out of Food in the Last Year: Never true  Transportation Needs: No Transportation Needs (04/15/2024)   PRAPARE - Administrator, Civil Service (Medical): No    Lack of Transportation (Non-Medical): No  Recent Concern: Transportation Needs - Unmet Transportation Needs (03/18/2024)   [facility] from [facility] - Transportation    Lack of Transportation (Medical): No    Lack of Transportation (Non-Medical): Yes  Physical Activity: Inactive (03/18/2024)   Received from [facility]   Exercise Vital Sign    Days of Exercise per Week: 0 days    Minutes of Exercise per Session: 0 min  Stress: Stress Concern Present (03/18/2024)   Received from [facility] - Occupational Stress Questionnaire    Feeling of Stress : Rather much  Social Connections: Socially Isolated (03/18/2024)   Received from [facility]   Social Connection and Isolation Panel [NHANES]    Frequency of Communication with Friends and Family: More than three times a week    Frequency of Social Gatherings with Friends and Family: More than three times a week    Attends Religious Services: Never    Database administrator or Organizations: No    Attends Banker Meetings: Never    Marital Status: Separated     Review of Systems: A 12 point ROS discussed and pertinent positives are indicated in the HPI above.  All other systems are negative.   Vital Signs: BP 128/74 (BP Location: Right Arm)   Pulse 89    Temp 98.2 F (36.8 C) (Oral)   Resp 16   Ht 5\' 5"  (1.651 m)   Wt 260 lb 2.3 oz (118 kg)   SpO2 94%   BMI 43.29 kg/m   Advance Care Plan: The advanced care place/surrogate decision maker was discussed at the time of visit and the patient did not wish to discuss or was not able to name a surrogate decision maker or provide an advance care plan.  Physical Exam Vitals and nursing note reviewed.  Constitutional:      Appearance: Normal appearance.  HENT:     Mouth/Throat:     Mouth: Mucous membranes are moist.     Pharynx: Oropharynx is clear.  Cardiovascular:     Rate and Rhythm: Normal rate and regular rhythm.  Pulmonary:     Effort: Pulmonary effort is normal.     Comments: Course breath sounds bilaterally, worse on left Abdominal:     Palpations:  Abdomen is soft.     Tenderness: There is no abdominal tenderness.  Musculoskeletal:     Right lower leg: No edema.     Left lower leg: No edema.     Comments: Soreness with palpation of left lateral flank and left lower lateral chest wall. No overyling abnormality.   Skin:    General: Skin is warm and dry.  Neurological:     Mental Status: She is alert and oriented to person, place, and time. Mental status is at baseline.     Imaging: CT CHEST ABDOMEN PELVIS W CONTRAST Result Date: 04/15/2024 CLINICAL DATA:  Left flank pain and hematuria with sepsis and shortness of breath. EXAM: CT CHEST, ABDOMEN, AND PELVIS WITH CONTRAST TECHNIQUE: Multidetector CT imaging of the chest, abdomen and pelvis was performed following the standard protocol during bolus administration of intravenous contrast. RADIATION DOSE REDUCTION: This exam was performed according to the departmental dose-optimization program which includes automated exposure control, adjustment of the mA and/or kV according to patient size and/or use of iterative reconstruction technique. CONTRAST:  75mL OMNIPAQUE IOHEXOL 350 MG/ML SOLN COMPARISON:  CT abdomen and pelvis 12/07/2023.  Report only CT angiogram chest 07/01/2023. FINDINGS: CT CHEST FINDINGS Cardiovascular: No significant vascular findings. Normal heart size. No pericardial effusion. Mediastinum/Nodes: No enlarged mediastinal, hilar, or axillary lymph nodes. Thyroid gland, trachea, and esophagus demonstrate no significant findings. Lungs/Pleura: There is a small left pleural effusion which is partially loculated in the upper hemithorax. There is compressive atelectasis of the left lower lobe. The right lung is clear. There is no pneumothorax. Musculoskeletal: Cervical spinal fusion hardware is present. No acute bony abnormality. CT ABDOMEN PELVIS FINDINGS Hepatobiliary: The liver is enlarged. No focal liver lesion identified. Gallbladder surgically absent. No biliary ductal dilatation. Pancreas: There is inflammatory stranding surrounding the pancreatic tail. There is a focal hypodensity in the pancreatic tail measuring 1.5 by 0.5 cm which is new from prior. The remaining pancreas appears within normal limits. Spleen: Spleen is normal in size and enhances normally. Low-density peripherally enhancing subcapsular fluid collection seen adjacent to the spleen measuring 3.1 x 8.5 by 4.5 cm. No splenic laceration. Adrenals/Urinary Tract: Adrenal glands are unremarkable. Kidneys are normal, without renal calculi, focal lesion, or hydronephrosis. Bladder is unremarkable. Stomach/Bowel: There is enhancing fluid collection adjacent to the fundus of the stomach and adjacent liver measuring 2.5 x 2.0 x 4.7 cm. There is a second enhancing fluid collection adjacent to the greater curvature of the stomach with adjacent stomach wall thickening measuring 3.2 x 3.0 x 3.7 cm. The stomach is nondilated. No other dilated bowel loops are visualized. The appendix is within normal limits. Vascular/Lymphatic: Aortic atherosclerosis. No enlarged abdominal or pelvic lymph nodes. Reproductive: There is an IUD in the uterus. Adnexa are within normal limits.  Other: There is no ascites or focal abdominal wall hernia. There is stranding and a small amount of fluid in the left upper quadrant. Enhancing fluid collection in the left upper quadrant measures 3.4 x 2.0 by 8.7 cm image 2/59. There is no free intraperitoneal air or focal abdominal wall hernia. 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 There is grade 1 anterolisthesis and severe degenerative changes at L3-L4. IMPRESSION: 1. Findings compatible with acute pancreatitis involving the pancreatic tail. 2. There are multiple enhancing fluid collections in the left upper quadrant adjacent to the spleen, stomach, and pancreatic tail worrisome for abscesses. The largest collection is in the left upper quadrant measuring 8.7 cm. 3. Small left pleural effusion  which is partially loculated in the upper hemithorax. 4. Left lower lobe atelectasis. 5. Hepatomegaly. 6. Aortic atherosclerosis. Aortic Atherosclerosis (ICD10-I70.0). Electronically Signed   By: Tyron Gallon M.D.   On: 04/15/2024 23:16   DG Chest 2 View Result Date: 04/15/2024 CLINICAL DATA:  Cough. Left back pain. Shortness of breath. Pneumonia diagnosis 1 month ago. EXAM: CHEST - 2 VIEW COMPARISON:  07/05/2023 FINDINGS: Shallow inspiration. Heart size and pulmonary vascularity are normal for technique. There is a moderate left pleural effusion with basilar atelectasis or infiltration, possibly indicating pneumonia. Right lung is clear. No pneumothorax. Mediastinal contours appear intact. Postoperative changes in the cervical spine. Inferior pedicular screws appear fractured, unchanged since prior study. Surgical clips in the right upper quadrant. IMPRESSION: Moderate left pleural effusion with basilar atelectasis or infiltration, possibly pneumonia. Electronically Signed   By: Boyce Byes M.D.   On: 04/15/2024 19:15    Labs:  CBC: Recent Labs    04/15/24 1731 04/16/24 0631 04/17/24 0703 04/18/24 0548  WBC 19.6*  16.2* 19.7* 17.8*  HGB 12.8 10.9* 11.0* 10.9*  HCT 39.5 33.8* 34.3* 33.7*  PLT 566* 505* 497* 529*    COAGS: No results for input(s): "INR", "APTT" in the last 8760 hours.  BMP: Recent Labs    04/15/24 1731 04/16/24 0631 04/17/24 0703 04/18/24 0548  NA 136 136 134* 136  K 3.4* 3.4* 3.5 3.6  CL 101 103 102 103  CO2 22 22 22 24   GLUCOSE 106* 108* 93 107*  BUN 13 12 10 11   CALCIUM 9.1 8.6* 8.4* 8.7*  CREATININE 0.92 0.93 0.85 0.85  GFRNONAA >60 >60 >60 >60    LIVER FUNCTION TESTS: Recent Labs    07/05/23 2312 04/15/24 1731 04/17/24 0703 04/18/24 0548  BILITOT 0.5 0.5 0.5 0.4  AST 12* 23 12* 12*  ALT 15 26 21 15   ALKPHOS 54 83 91 73  PROT 6.6 6.7 6.0* 5.9*  ALBUMIN 2.8* 2.4* 2.2* 2.1*    TUMOR MARKERS: No results for input(s): "AFPTM", "CEA", "CA199", "CHROMGRNA" in the last 8760 hours.  Assessment and Plan:  Katrina Lambert is a 38 y.o. female with pmhx of anxiety and depression, bipolar disorder, GERD, morbid obesity, prediabetes, tobacco use disorder, cervical myelopathy. Initially presented to ED on 5/31 for dyspnea on exertion, cough and left flank back pain. Was admitted for left lower lobe pneumonia at [facility] 2 weeks ago. Found to have leukocytosis 19.6. CXR from 5/31 with moderate left pleural effusion. Had CT chest abdomen pelvis 5/31 showing acute pancreatitis, multiple fluid collections in the left upper quadrant worrisome for infection, as well as small partially loculated left pleural effusion. 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 IR additionally consulted for left chest tube placement for loculated left pleural effusion, which we are planning on performing today.  Today pt with some left flank/left sided soreness particularly with deep breaths and movement. Otherwise no additional new complaints. Has been NPO since midnight.  Ordering provider, Dr.  Diania Fortes with pulmonology requesting 3 way stop valve for chest tube, CT department made aware of request and will place today.  Risks and benefits of chest tube placement were discussed with the patient including bleeding, infection, damage to adjacent structures, malfunction of the tube requiring additional procedures and sepsis.  All of the patient's questions were answered, patient is agreeable to proceed. Consent signed and in chart.   Thank you for allowing our service to  participate in Katrina Lambert 's care.  Electronically Signed: Nicolasa Barrett, PA-C   04/18/2024, 11:49 AM      I spent a total of 40 Minutes    in face to face in clinical consultation, greater than 50% of which was counseling/coordinating care for left chest tube placement.

## 2024-04-18 NOTE — Progress Notes (Signed)
 NAME:  Katrina Lambert, MRN:  [id number], DOB:  August 21, 1986, LOS: 3 ADMISSION DATE:  04/15/2024, CONSULTATION DATE:  04/16/24 REFERRING MD:  Althia Atlas, MD CHIEF COMPLAINT:  pneumonia   History of Present Illness:  Katrina Lambert is a 38 year old woman daily smoker with history of hypertension, obesity, bipolar disorder, GERD and asthma who is admitted for sepsis due to pneumonia and possible pancreatitis.   PCCM consulted for pneumonia and left pleural effusion. She was treated for pneumonia 2 weeks ago at [facility].   CT Chest shows partially loculated left pleural effusion. Findings of acute pancreatitis and multiple fluid enhancing collections in the left upper quadrant. 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  ID consulted, recommended to complete course of doxycycline and augmentin.   Patient reports cough, sputum production and some wheezing along with dyspnea. Denies hemoptysis. She feels like she has subjective fevers.  Pertinent  Medical History   Past Medical History:  Diagnosis Date   Anxiety    Arthritis    bilateral hips and knees   Asthma    Bipolar disorder (HCC)    Depression    Dyspnea    GERD (gastroesophageal reflux disease)    Headache    Hypertension    Neuromuscular disorder (HCC)    PONV (postoperative nausea and vomiting)    Pre-diabetes     Significant Hospital Events: Including procedures, antibiotic start and stop dates in addition to other pertinent events   5/31 admitted to hospital 6/1 PCCM, IR, ID consulted  Interim History / Subjective:   Scheduled for placement of chest tube by IR.  Objective    Blood pressure 125/66, pulse 94, temperature 98.2 F (36.8 C), temperature source Oral, resp. rate 17, height 5\' 5"  (1.651 m), weight 118 kg, SpO2 99%.        Intake/Output Summary (Last 24 hours) at 04/18/2024 1421 Last data filed at 04/17/2024 1600 Gross  per 24 hour  Intake 50.38 ml  Output --  Net 50.38 ml   Filed Weights   04/15/24 1728  Weight: 118 kg    Examination: General: young woman, obese, no acute distress HENT: [place]/AT, moist mucous membranes Lungs: scattered wheezing - mild Cardiovascular: rrr, no murmurs Abdomen: soft, mildly tender Extremities: warm, no edema Neuro: alert, oriented moving all extremities GU: n/a  Bedside US  6/2 Moderate sized left pleural effusion  Resolved problem list   Assessment and Plan   Sepsis due to pancreatitis Left Partially Loculated Pleural Effusion  Anemia Thrombocytosis  Plan: - Bedside US  shows left pleural effusion, moderate in size - consulted IR for placement of chest tube given patient's body habitus making difficulty for bedside placement.  - Will follow up pleural fluid studies: cell count/differential, gram stain/culture, cytology, pleural fluid LDH, total protein - effusion could be coming from abdominal process with pancreatitis vs parapneumonic effusion given recent pneumonia - agree with doxycycline and augmentin at this time - CRP, ESR both elevated - continue anoro inhaler for asthma, avoiding inhaled steroids to due possible infection - use as needed albuterol   PCCM will continue to follow    Best Practice (right click and "Reselect all SmartList Selections" daily)   Per primary  Labs   CBC: Recent Labs  Lab 04/15/24 1731 04/16/24 0631 04/17/24 0703 04/18/24 0548  WBC 19.6* 16.2* 19.7* 17.8*  HGB 12.8 10.9* 11.0* 10.9*  HCT 39.5 33.8* 34.3* 33.7*  MCV 84.6  84.7 85.1 84.7  PLT 566* 505* 497* 529*    Basic Metabolic Panel: Recent Labs  Lab 04/15/24 1731 04/16/24 0631 04/16/24 0924 04/17/24 0703 04/18/24 0548  NA 136 136  --  134* 136  K 3.4* 3.4*  --  3.5 3.6  CL 101 103  --  102 103  CO2 22 22  --  22 24  GLUCOSE 106* 108*  --  93 107*  BUN 13 12  --  10 11  CREATININE 0.92 0.93  --  0.85 0.85  CALCIUM 9.1 8.6*  --  8.4* 8.7*  MG   --   --  1.8 1.6* 1.7  PHOS  --   --  4.3 4.9* 4.9*   GFR: Estimated Creatinine Clearance: 116.4 mL/min (by C-G formula based on SCr of 0.85 mg/dL). Recent Labs  Lab 04/15/24 1731 04/15/24 2020 04/16/24 0631 04/17/24 0703 04/18/24 0548  PROCALCITON  --  0.75  --   --   --   WBC 19.6*  --  16.2* 19.7* 17.8*    Liver Function Tests: Recent Labs  Lab 04/15/24 1731 04/17/24 0703 04/18/24 0548  AST 23 12* 12*  ALT 26 21 15   ALKPHOS 83 91 73  BILITOT 0.5 0.5 0.4  PROT 6.7 6.0* 5.9*  ALBUMIN 2.4* 2.2* 2.1*   Recent Labs  Lab 04/15/24 1731 04/17/24 0703 04/18/24 0548  LIPASE 146* 135* 122*   No results for input(s): "AMMONIA" in the last 168 hours.  ABG No results found for: "PHART", "PCO2ART", "PO2ART", "HCO3", "TCO2", "ACIDBASEDEF", "O2SAT"   Coagulation Profile: No results for input(s): "INR", "PROTIME" in the last 168 hours.  Cardiac Enzymes: No results for input(s): "CKTOTAL", "CKMB", "CKMBINDEX", "TROPONINI" in the last 168 hours.  HbA1C: Hgb A1c MFr Bld  Date/Time Value Ref Range Status  05/30/2021 01:55 PM 5.7 (H) 4.8 - 5.6 % Final    Comment:    (NOTE) Pre diabetes:          5.7%-6.4%  Diabetes:              >6.4%  Glycemic control for   <7.0% adults with diabetes     CBG: No results for input(s): "GLUCAP" in the last 168 hours.     Critical care time: n/a    Duaine German, MD [facility] Office: [phone]   See Amion for personal pager PCCM on call pager [phone] until 7pm. Please call Elink 7p-7a. [phone]

## 2024-04-18 NOTE — Plan of Care (Signed)
  Problem: Coping: Goal: Level of anxiety will decrease Outcome: Progressing   Problem: Activity: Goal: Risk for activity intolerance will decrease Outcome: Progressing   Problem: Elimination: Goal: Will not experience complications related to bowel motility Outcome: Progressing   Problem: Elimination: Goal: Will not experience complications related to urinary retention Outcome: Progressing   Problem: Skin Integrity: Goal: Risk for impaired skin integrity will decrease Outcome: Progressing   Problem: Safety: Goal: Ability to remain free from injury will improve Outcome: Progressing   Problem: Activity: Goal: Ability to tolerate increased activity will improve Outcome: Progressing

## 2024-04-19 ENCOUNTER — Inpatient Hospital Stay (HOSPITAL_COMMUNITY): Payer: MEDICAID

## 2024-04-19 DIAGNOSIS — A419 Sepsis, unspecified organism: Secondary | ICD-10-CM | POA: Diagnosis not present

## 2024-04-19 DIAGNOSIS — J189 Pneumonia, unspecified organism: Secondary | ICD-10-CM | POA: Diagnosis not present

## 2024-04-19 DIAGNOSIS — K8500 Idiopathic acute pancreatitis without necrosis or infection: Secondary | ICD-10-CM | POA: Diagnosis not present

## 2024-04-19 DIAGNOSIS — F17210 Nicotine dependence, cigarettes, uncomplicated: Secondary | ICD-10-CM | POA: Diagnosis not present

## 2024-04-19 LAB — PHOSPHORUS: Phosphorus: 4.7 mg/dL — ABNORMAL HIGH (ref 2.5–4.6)

## 2024-04-19 LAB — CBC
HCT: 37.8 % (ref 36.0–46.0)
Hemoglobin: 12.1 g/dL (ref 12.0–15.0)
MCH: 27.1 pg (ref 26.0–34.0)
MCHC: 32 g/dL (ref 30.0–36.0)
MCV: 84.6 fL (ref 80.0–100.0)
Platelets: 611 10*3/uL — ABNORMAL HIGH (ref 150–400)
RBC: 4.47 MIL/uL (ref 3.87–5.11)
RDW: 13.5 % (ref 11.5–15.5)
WBC: 16.3 10*3/uL — ABNORMAL HIGH (ref 4.0–10.5)
nRBC: 0 % (ref 0.0–0.2)

## 2024-04-19 LAB — BASIC METABOLIC PANEL WITH GFR
Anion gap: 10 (ref 5–15)
BUN: 13 mg/dL (ref 6–20)
CO2: 24 mmol/L (ref 22–32)
Calcium: 9 mg/dL (ref 8.9–10.3)
Chloride: 101 mmol/L (ref 98–111)
Creatinine, Ser: 0.88 mg/dL (ref 0.44–1.00)
GFR, Estimated: >60 mL/min (ref >60)
Glucose, Bld: 108 mg/dL — ABNORMAL HIGH (ref 70–99)
Potassium: 3.7 mmol/L (ref 3.5–5.1)
Sodium: 135 mmol/L (ref 135–145)

## 2024-04-19 LAB — MAGNESIUM: Magnesium: 1.7 mg/dL (ref 1.7–2.4)

## 2024-04-19 LAB — HEPATIC FUNCTION PANEL
ALT: 15 U/L (ref 0–44)
AST: 11 U/L — ABNORMAL LOW (ref 15–41)
Albumin: 2.4 g/dL — ABNORMAL LOW (ref 3.5–5.0)
Alkaline Phosphatase: 62 U/L (ref 38–126)
Bilirubin, Direct: <0.1 mg/dL (ref 0.0–0.2)
Total Bilirubin: 0.4 mg/dL (ref 0.0–1.2)
Total Protein: 6.4 g/dL — ABNORMAL LOW (ref 6.5–8.1)

## 2024-04-19 LAB — LEGIONELLA PNEUMOPHILA SEROGP 1 UR AG: L. pneumophila Serogp 1 Ur Ag: NEGATIVE

## 2024-04-19 LAB — LIPASE, BLOOD: Lipase: 159 U/L — ABNORMAL HIGH (ref 11–51)

## 2024-04-19 MED ORDER — VITAMIN B-12 1000 MCG PO TABS
1000.0000 ug | ORAL_TABLET | Freq: Every day | ORAL | Status: DC
  Administered 2024-04-23 – 2024-04-26 (×4): 1000 ug via ORAL
  Filled 2024-04-19 (×4): qty 1

## 2024-04-19 MED ORDER — ENSURE PLUS HIGH PROTEIN PO LIQD
237.0000 mL | Freq: Three times a day (TID) | ORAL | Status: DC
  Administered 2024-04-19 – 2024-04-26 (×8): 237 mL via ORAL

## 2024-04-19 MED ORDER — OXYCODONE HCL 5 MG PO TABS
5.0000 mg | ORAL_TABLET | ORAL | Status: DC | PRN
  Administered 2024-04-19: 10 mg via ORAL
  Administered 2024-04-20: 5 mg via ORAL
  Administered 2024-04-20 – 2024-04-23 (×10): 10 mg via ORAL
  Administered 2024-04-23: 5 mg via ORAL
  Administered 2024-04-23 – 2024-04-24 (×6): 10 mg via ORAL
  Administered 2024-04-25: 5 mg via ORAL
  Administered 2024-04-25: 10 mg via ORAL
  Administered 2024-04-25: 5 mg via ORAL
  Administered 2024-04-25 – 2024-04-26 (×4): 10 mg via ORAL
  Filled 2024-04-19 (×11): qty 2
  Filled 2024-04-19: qty 1
  Filled 2024-04-19 (×4): qty 2
  Filled 2024-04-19: qty 1
  Filled 2024-04-19 (×2): qty 2
  Filled 2024-04-19: qty 1
  Filled 2024-04-19 (×6): qty 2

## 2024-04-19 MED ORDER — VITAMIN B-12 1000 MCG PO TABS: 1000.0000 ug | ORAL_TABLET | Freq: Every day | ORAL | Status: DC

## 2024-04-19 MED ORDER — SODIUM CHLORIDE 0.9% FLUSH
10.0000 mL | Freq: Three times a day (TID) | INTRAVENOUS | Status: DC
  Administered 2024-04-19 – 2024-04-24 (×14): 10 mL via INTRAPLEURAL

## 2024-04-19 MED ORDER — FLUCONAZOLE 150 MG PO TABS
150.0000 mg | ORAL_TABLET | Freq: Once | ORAL | Status: AC
  Administered 2024-04-19: 150 mg via ORAL
  Filled 2024-04-19: qty 1

## 2024-04-19 MED ORDER — CYANOCOBALAMIN 1000 MCG/ML IJ SOLN
1000.0000 ug | Freq: Every day | INTRAMUSCULAR | Status: AC
  Administered 2024-04-20 – 2024-04-22 (×3): 1000 ug via INTRAMUSCULAR
  Filled 2024-04-19 (×3): qty 1

## 2024-04-19 MED ORDER — HYDROMORPHONE HCL 1 MG/ML IJ SOLN
1.0000 mg | INTRAMUSCULAR | Status: DC | PRN
  Administered 2024-04-19 – 2024-04-22 (×5): 1 mg via INTRAVENOUS
  Filled 2024-04-19 (×5): qty 1

## 2024-04-19 MED ORDER — CYANOCOBALAMIN 1000 MCG/ML IJ SOLN: 1000.0000 ug | Freq: Every day | INTRAMUSCULAR | Status: DC

## 2024-04-19 MED ORDER — SODIUM CHLORIDE (PF) 0.9 % IJ SOLN
10.0000 mg | Freq: Once | INTRAMUSCULAR | Status: DC
  Filled 2024-04-19: qty 10

## 2024-04-19 MED ORDER — NYSTATIN 100000 UNIT/GM EX POWD
Freq: Two times a day (BID) | CUTANEOUS | Status: DC
  Filled 2024-04-19: qty 15

## 2024-04-19 MED ORDER — HYDROMORPHONE HCL 1 MG/ML IJ SOLN
0.5000 mg | Freq: Once | INTRAMUSCULAR | Status: AC
  Administered 2024-04-19: 0.5 mg via INTRAVENOUS

## 2024-04-19 MED ORDER — STERILE WATER FOR INJECTION IJ SOLN
5.0000 mg | Freq: Once | RESPIRATORY_TRACT | Status: DC
  Filled 2024-04-19: qty 5

## 2024-04-19 NOTE — Procedures (Signed)
 Pleural Fibrinolytic Administration Procedure Note  LIBERTIE HAUSLER  [id number]  10-Dec-1985  Date:04/19/24  Time:9:45 AM   Provider Performing:Doren Kaspar B Amrie Gurganus   Procedure: Pleural Fibrinolysis Initial day [phone])  Indication(s) Fibrinolysis of complicated pleural effusion  Consent Risks of the procedure as well as the alternatives and risks of each were explained to the patient and/or caregiver.  Consent for the procedure was obtained.   Anesthesia None   Time Out Verified patient identification, verified procedure, site/side was marked, verified correct patient position, special equipment/implants available, medications/allergies/relevant history reviewed, required imaging and test results available.   Sterile Technique Hand hygiene, gloves   Procedure Description Existing pleural catheter was cleaned and accessed in sterile manner.  10mg  of tPA in 30cc of saline and 5mg  of dornase in 30cc of sterile water were injected into pleural space using existing pleural catheter.  Catheter will be clamped for 1 hour and then placed back to suction.   Complications/Tolerance None; patient tolerated the procedure well.  EBL None   Specimen(s) None

## 2024-04-19 NOTE — Progress Notes (Signed)
 Called to pt room. Pt having extreme abdominal pain. States she's never felt this pain before and is very concerned. McClung,MD made aware. Dilaudid  PRN pain med given

## 2024-04-19 NOTE — Progress Notes (Signed)
 Chest tube unclamped

## 2024-04-19 NOTE — Plan of Care (Signed)
  Problem: Clinical Measurements: Goal: Ability to maintain clinical measurements within normal limits will improve Outcome: Progressing Goal: Diagnostic test results will improve Outcome: Progressing   Problem: Nutrition: Goal: Adequate nutrition will be maintained Outcome: Progressing   Problem: Skin Integrity: Goal: Risk for impaired skin integrity will decrease Outcome: Progressing   Problem: Activity: Goal: Ability to tolerate increased activity will improve Outcome: Progressing

## 2024-04-19 NOTE — Progress Notes (Signed)
 Nutrition Follow-up  DOCUMENTATION CODES:   Severe malnutrition in context of acute illness/injury, Morbid obesity  INTERVENTION:   48 hour calorie count  Encourage PO intake as allows Continue Heart healthy diet  Ensure Enlive po BID, each supplement provides 350 kcal and 20 grams of protein. Magic cup TID with meals, each supplement provides 290 kcal and 9 grams of protein MVI with minerals daily 100 mg Thiamine  daily Continue Vitamin B12- repletion   Continue 50,000 units Vitamin D every 7 days x 8 weeks for deficiency followed by 1,000 units Vitamin D daily for maintenance  When PO intake increases Monitor magnesium, potassium, and phosphorus BID for at least 3 days, MD to replete as needed, as pt is at risk for refeeding syndrome  Provided Pancreatitis Nutrition Therapy handout at bedside  If continued poor PO intake recommend initiation of enteral feedings within 48 hours.   NUTRITION DIAGNOSIS:   Severe Malnutrition related to acute illness as evidenced by mild muscle depletion, energy intake < or equal to 50% for > or equal to 5 days, percent weight loss (20 lb, 7% weight loss in 1 month.).  - Still applicable   GOAL:   Patient will meet greater than or equal to 90% of their needs  - Progressing   MONITOR:   PO intake, Diet advancement, Supplement acceptance, Labs, I & O's, Weight trends  REASON FOR ASSESSMENT:   Consult Assessment of nutrition requirement/status, Calorie Count  ASSESSMENT:   38 y.o Female with multiple admission within the last month for acute pancreatitis, abscess of her left thigh, and pneumonia. Presented with ongoing LUQ abdominal pain with nausea and vomiting, SOB, and back pain. Admitted for sepsis secondary to pneumonia and pancreatitis. PMH of anxiety and depression, bipolar disorder, GERD, obesity, prediabetes, tobacco use disorder.  5/31 - CT abd: shows evidence of acute pancreatitis with multiple enhancing fluid collections in the  LUQ adjacent to the spleen, stomach and pancreatic tail concerning for abscesses.  6/3 - s/p Chest tube insertion    Pt lying in bed in crying in significant pain. Reports not having an appetite due to her pain. Only had an apple this morning and banana pudding last night for dinner. Per last RD note Patient has had poor PO intake for 1 month now and has only been able to take bites of food since admission. Calorie count to start today to access need for enteral feeding. Reports only having an apple for breakfast today.   Admit weight: 118 kg  Current weight: 118 kg    Average Meal Intake: 6/1: 50% x 2 meals 6/2: 75% x 1 meal 6/3: 100% x 1 meal  Intake/Output Summary (Last 24 hours) at 04/19/2024 1532 Last data filed at 04/19/2024 1610 Gross per 24 hour  Intake 476 ml  Output 595 ml  Net -119 ml   Drains/Lines: Chest tube: 82 ml x 24 hours   Nutritionally Relevant Medications: Scheduled Meds:  ascorbic acid  500 mg Oral Daily   cyanocobalamin  1,000 mcg Intramuscular Daily   Followed by   Cecily Cohen ON 04/23/2024] vitamin B-12  1,000 mcg Oral Daily   doxycycline  100 mg Oral Q12H   enoxaparin (LOVENOX) injection  40 mg Subcutaneous Q24H   feeding supplement  237 mL Oral TID BM   folic acid  1 mg Oral Daily   hydrochlorothiazide   12.5 mg Oral Daily    HYDROmorphone  (DILAUDID ) injection  0.5 mg Intravenous Once   iron polysaccharides  150 mg Oral Daily  lisinopril   20 mg Oral Daily   metFORMIN  500 mg Oral BID WC   multivitamin with minerals  1 tablet Oral Daily   mupirocin ointment   Topical BID   nicotine   21 mg Transdermal Daily   potassium chloride  20 mEq Oral Daily   sodium chloride  flush  10 mL Intrapleural Q8H   thiamine   100 mg Oral Daily   umeclidinium-vilanterol  1 puff Inhalation Daily   Vitamin D (Ergocalciferol)  50,000 Units Oral Q7 days   Labs Reviewed: Calcium 8.7 Phosphorus 4.7 Albumin 2.4 Lipase 122 AST 11 Total protein 6.4 CRP 19.1 Vitamin D  14.04 Vitamin B12 80  CBG ranges from 107-108 mg/dL over the last 24 hours HgbA1c 5.7 (20222)   Diet Order:   Diet Order             Diet Heart Room service appropriate? Yes with Assist; Fluid consistency: Thin  Diet effective now                   EDUCATION NEEDS:   Education needs have been addressed  Skin:  Skin Assessment: Skin Integrity Issues: Skin Integrity Issues:: Incisions Incisions: Left hip  Last BM:  04/17/2024  Height:   Ht Readings from Last 1 Encounters:  04/15/24 5\' 5"  (1.651 m)    Weight:   Wt Readings from Last 1 Encounters:  04/15/24 118 kg    Ideal Body Weight:  56.8 kg  BMI:  Body mass index is 43.29 kg/m.  Estimated Nutritional Needs:   Kcal:  2100-2300 kcal  Protein:  120-140 gm  Fluid:  >2L/day   Frederik Jansky, RD Registered Dietitian  See Amion for more information

## 2024-04-19 NOTE — Progress Notes (Signed)
 NAME:  Katrina Lambert, MRN:  [id number], DOB:  [birth year], LOS: 4 ADMISSION DATE:  04/15/2024, CONSULTATION DATE:  04/16/24 REFERRING MD:  Althia Atlas, MD CHIEF COMPLAINT:  pneumonia   History of Present Illness:  Katrina Lambert is a 38 year old woman daily smoker with history of hypertension, obesity, bipolar disorder, GERD and asthma who is admitted for sepsis due to pneumonia and possible pancreatitis.   PCCM consulted for pneumonia and left pleural effusion. She was treated for pneumonia 2 weeks ago at [facility].   CT Chest shows partially loculated left pleural effusion. Findings of acute pancreatitis and multiple fluid enhancing collections in the left upper quadrant. 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  ID consulted, recommended to complete course of doxycycline and augmentin.   Patient reports cough, sputum production and some wheezing along with dyspnea. Denies hemoptysis. She feels like she has subjective fevers.  Pertinent  Medical History   Past Medical History:  Diagnosis Date   Anxiety    Arthritis    bilateral hips and knees   Asthma    Bipolar disorder (HCC)    Depression    Dyspnea    GERD (gastroesophageal reflux disease)    Headache    Hypertension    Neuromuscular disorder (HCC)    PONV (postoperative nausea and vomiting)    Pre-diabetes     Significant Hospital Events: Including procedures, antibiotic start and stop dates in addition to other pertinent events   5/31 admitted to hospital 6/1 PCCM, IR, ID consulted 6/3 chest tube placed by IR, no pleural fluid sent for studies  Interim History / Subjective:   Chest tube drained since placement Her breathing feels better Chest x-ray reviewed, on going left basilar infiltrate and small effusion noted.   Objective    Blood pressure 122/83, pulse (!) 104, temperature 98.3 F (36.8 C), temperature source  Oral, resp. rate 18, height 5\' 5"  (1.651 m), weight 118 kg, SpO2 95%.        Intake/Output Summary (Last 24 hours) at 04/19/2024 0946 Last data filed at 04/19/2024 6962 Gross per 24 hour  Intake 476 ml  Output 845 ml  Net -369 ml   Filed Weights   04/15/24 1728  Weight: 118 kg    Examination: General: young woman, obese, no acute distress HENT: [place]/AT, moist mucous membranes Lungs: no wheezing, chest tube in place Cardiovascular: rrr, no murmurs Abdomen: soft, mildly tender Extremities: warm, no edema Neuro: alert, oriented moving all extremities GU: n/a  Bedside US  6/2 Moderate sized left pleural effusion  Resolved problem list   Assessment and Plan   Sepsis due to pancreatitis Left Partially Loculated Pleural Effusion  Anemia Thrombocytosis  Plan: - Continue chest tube to -20cmH2O suction - instilled pleural lytics today - repeat chest x-ray tomorrow - no pleural fluid studies sent, concern for parapneumonic effusion vs effusion from abdominal pathology from pancreatitis. - Continue antibiotics - continue anoro inhaler for asthma, avoiding inhaled steroids to due possible infection - use as needed albuterol   PCCM will continue to follow    Best Practice (right click and "Reselect all SmartList Selections" daily)   Per primary  Labs   CBC: Recent Labs  Lab 04/15/24 1731 04/16/24 0631 04/17/24 0703 04/18/24 0548 04/19/24 0518  WBC 19.6* 16.2* 19.7* 17.8* 16.3*  HGB 12.8 10.9* 11.0* 10.9* 12.1  HCT 39.5 33.8* 34.3* 33.7* 37.8  MCV 84.6 84.7 85.1  84.7 84.6  PLT 566* 505* 497* 529* 611*    Basic Metabolic Panel: Recent Labs  Lab 04/15/24 1731 04/16/24 0631 04/16/24 0924 04/17/24 0703 04/18/24 0548 04/19/24 0518  NA 136 136  --  134* 136 135  K 3.4* 3.4*  --  3.5 3.6 3.7  CL 101 103  --  102 103 101  CO2 22 22  --  22 24 24   GLUCOSE 106* 108*  --  93 107* 108*  BUN 13 12  --  10 11 13   CREATININE 0.92 0.93  --  0.85 0.85 0.88  CALCIUM 9.1  8.6*  --  8.4* 8.7* 9.0  MG  --   --  1.8 1.6* 1.7 1.7  PHOS  --   --  4.3 4.9* 4.9* 4.7*   GFR: Estimated Creatinine Clearance: 112.5 mL/min (by C-G formula based on SCr of 0.88 mg/dL). Recent Labs  Lab 04/15/24 2020 04/16/24 0631 04/17/24 0703 04/18/24 0548 04/19/24 0518  PROCALCITON 0.75  --   --   --   --   WBC  --  16.2* 19.7* 17.8* 16.3*    Liver Function Tests: Recent Labs  Lab 04/15/24 1731 04/17/24 0703 04/18/24 0548 04/19/24 0518  AST 23 12* 12* 11*  ALT 26 21 15 15   ALKPHOS 83 91 73 62  BILITOT 0.5 0.5 0.4 0.4  PROT 6.7 6.0* 5.9* 6.4*  ALBUMIN 2.4* 2.2* 2.1* 2.4*   Recent Labs  Lab 04/15/24 1731 04/17/24 0703 04/18/24 0548 04/19/24 0518  LIPASE 146* 135* 122* 159*   No results for input(s): "AMMONIA" in the last 168 hours.  ABG No results found for: "PHART", "PCO2ART", "PO2ART", "HCO3", "TCO2", "ACIDBASEDEF", "O2SAT"   Coagulation Profile: No results for input(s): "INR", "PROTIME" in the last 168 hours.  Cardiac Enzymes: No results for input(s): "CKTOTAL", "CKMB", "CKMBINDEX", "TROPONINI" in the last 168 hours.  HbA1C: Hgb A1c MFr Bld  Date/Time Value Ref Range Status  05/30/2021 01:55 PM 5.7 (H) 4.8 - 5.6 % Final    Comment:    (NOTE) Pre diabetes:          5.7%-6.4%  Diabetes:              >6.4%  Glycemic control for   <7.0% adults with diabetes     CBG: No results for input(s): "GLUCAP" in the last 168 hours.     Critical care time: n/a    Duaine German, MD [facility] Office: [phone]   See Amion for personal pager PCCM on call pager [phone] until 7pm. Please call Elink 7p-7a. [phone]

## 2024-04-19 NOTE — Progress Notes (Signed)
 Katrina Lambert  KGM:[id number] DOB: [birth year] DOA: 04/15/2024 PCP: Aurelio Blower [facility]    Brief Narrative:  38 year old with a history of anxiety/depression, bipolar disorder, GERD, morbid obesity, prediabetes, cervical myelopathy, and tobacco abuse who presented to the ED 5/31 with cough, dyspnea on exertion, and back pain.  She had recently undergone a 3-day hospitalization at [facility] for left lower lobe pneumonia, having been discharged approximately 2 weeks ago.  In the ER a CXR noted a moderate left pleural effusion with bibasilar atelectasis versus infiltrate.  Goals of Care:   Code Status: Full Code   DVT prophylaxis: enoxaparin (LOVENOX) injection 40 mg Start: 04/15/24 2100   Interim Hx: Afebrile.  Vital signs stable.  Reports unrelenting left pleural pain which has not improved with her current pain medication regimen.  Denies severe shortness of breath nausea vomiting or abdominal pain.  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 Was initially admitted at [facility] 03/18/24 with acute pancreatitis  -inciting etiology unclear -but CT abdomen/pelvis this admission noted persisting evidence of pancreatic inflammation and lipase was elevated at 146 -General Surgery has evaluated the patient and does not feel that any acute intervention is indicated -GI recommended ongoing symptomatic treatment with outpatient follow-up once pseudocyst  have matured  HTN Blood pressure presently well-controlled  PCOS Continue usual metformin dose  Prediabetes Continue metformin  Hypomagnesemia Improving with supplementation  Iron deficiency Iron quite low at 17 with low TIBC at 190 likely due to ongoing smoldering infection and acute pancreatitis -avoiding IV iron in setting of acute infection  B12 deficiency B12 markedly low at 80 -supplementation ongoing  Vitamin D deficiency 25-hydroxy vitamin D low at 14 -supplementation initiated  Tobacco abuse Advised that she must discontinue smoking entirely  Class III obesity - Body mass index is 43.29 kg/m.   Family Communication: No family present at time of exam Disposition: Eventual discharge home   Objective: Blood pressure 122/83, pulse (!) 104, temperature 98.3 F (36.8 C), temperature source Oral, resp. rate 18, height 5\' 5"  (1.651 m), weight 118 kg, SpO2 95%.  Intake/Output Summary (Last 24 hours) at 04/19/2024 1055 Last data filed at 04/19/2024 0706 Gross per 24 hour  Intake 476 ml  Output 845 ml  Net -369 ml   Filed Weights   04/15/24 1728  Weight: 118 kg    Examination: General: No acute respiratory distress Lungs: Poor air movement in left base but good air movement throughout other fields without wheezing Cardiovascular: Regular rate and rhythm without murmur gallop or rub normal S1 and S2 Abdomen: Nontender, nondistended, soft, bowel sounds positive, no rebound, no ascites, no appreciable mass Extremities: No significant cyanosis, clubbing, or edema bilateral lower extremities  CBC: Recent Labs  Lab 04/17/24 0703 04/18/24 0548 04/19/24 0518  WBC 19.7* 17.8* 16.3*  HGB 11.0* 10.9* 12.1  HCT 34.3* 33.7* 37.8  MCV 85.1 84.7 84.6  PLT 497* 529* 611*   Basic Metabolic Panel: Recent Labs  Lab 04/17/24 0703 04/18/24 0548 04/19/24 0518  NA 134* 136 135  K 3.5 3.6 3.7  CL 102 103 101  CO2 22 24 24   GLUCOSE 93 107* 108*  BUN 10 11 13    CREATININE 0.85 0.85 0.88  CALCIUM 8.4* 8.7* 9.0  MG 1.6* 1.7 1.7  PHOS 4.9* 4.9* 4.7*   GFR: Estimated Creatinine Clearance: 112.5 mL/min (by C-G formula based on SCr of 0.88 mg/dL).   Scheduled Meds:  acetaminophen   650 mg Oral TID   alteplase (CATHFLO ACTIVASE) 10 mg in sodium chloride  (PF) 0.9 % 30 mL  10 mg Intrapleural Once   And   dornase alfa (PULMOZYME) 5 mg in sterile water (preservative free) 30 mL  5 mg Intrapleural Once   amLODipine   10 mg Oral q morning   amoxicillin-clavulanate  1 tablet Oral Q12H   ascorbic acid  500 mg Oral Daily   cyanocobalamin  1,000 mcg Intramuscular Daily   Followed by   Cecily Cohen ON 04/23/2024] vitamin B-12  1,000 mcg Oral Daily   doxycycline  100 mg Oral Q12H   enoxaparin (LOVENOX) injection  40 mg Subcutaneous Q24H   feeding supplement  237 mL Oral TID BM   folic acid  1 mg Oral Daily   hydrochlorothiazide   12.5 mg Oral Daily   iron polysaccharides  150 mg Oral Daily   lisinopril   20 mg Oral Daily   metFORMIN  500 mg Oral BID WC   multivitamin with minerals  1 tablet Oral Daily   mupirocin ointment   Topical BID   nicotine   21 mg Transdermal Daily   potassium chloride  20 mEq Oral Daily   sodium chloride  flush  10 mL Intrapleural Q8H   thiamine   100 mg Oral Daily   umeclidinium-vilanterol  1 puff Inhalation Daily   Vitamin D (Ergocalciferol)  50,000 Units Oral Q7 days      LOS: 4 days   Abbe Abate, MD [facility] Office  [phone] Pager - Text Page per [place] Foley  If 7PM-7AM, please contact night-coverage per Amion 04/19/2024, 10:55 AM

## 2024-04-19 NOTE — Progress Notes (Signed)
 [facility]    Date of Admission:  04/15/2024   Total days of antibiotics 5   ID: Katrina Lambert is a 38 y.o. female with  pancreatitis and pseudocyst and left complex effusion Principal Problem:   Sepsis due to pneumonia [facility] Active Problems:   Acute pancreatitis   Intra-abdominal abscess (HCC)   Pleural effusion on left   Hospital-acquired pneumonia   Tobacco use disorder   Upper abdominal pain   Nausea and vomiting in adult   Loss of appetite   Abnormal loss of weight   Protein-calorie malnutrition, severe    Subjective: Afebrile, feels more comfortable to breath since chest tube placement. drained in collection system Also complains of vaginal itching, candidal rash to abdomen Medications:   acetaminophen   650 mg Oral TID   alteplase (CATHFLO ACTIVASE) 10 mg in sodium chloride  (PF) 0.9 % 30 mL  10 mg Intrapleural Once   And   dornase alfa (PULMOZYME) 5 mg in sterile water (preservative free) 30 mL  5 mg Intrapleural Once   amLODipine   10 mg Oral q morning   amoxicillin-clavulanate  1 tablet Oral Q12H   ascorbic acid  500 mg Oral Daily   [START ON 04/20/2024] cyanocobalamin  1,000 mcg Intramuscular Daily   Followed by   Cecily Cohen ON 04/23/2024] vitamin B-12  1,000 mcg Oral Daily   doxycycline  100 mg Oral Q12H   enoxaparin (LOVENOX) injection  40 mg Subcutaneous Q24H   feeding supplement  237 mL Oral TID BM   folic acid  1 mg Oral Daily   iron polysaccharides  150 mg Oral Daily   lisinopril   20 mg Oral Daily   metFORMIN  500 mg Oral BID WC   multivitamin with minerals  1 tablet Oral Daily   mupirocin ointment   Topical BID   nicotine   21 mg Transdermal Daily   nystatin   Topical BID   potassium chloride  20 mEq Oral Daily   sodium chloride  flush  10 mL Intrapleural Q8H   thiamine   100 mg Oral Daily   umeclidinium-vilanterol  1 puff Inhalation Daily   Vitamin D (Ergocalciferol)  50,000 Units Oral Q7 days    Objective: Vital signs in  last 24 hours: Temp:  [97.8 F (36.6 C)-98.3 F (36.8 C)] 97.8 F (36.6 C) (06/04 1630) Pulse Rate:  [90-104] 104 (06/04 1630) Resp:  [14-18] 18 (06/04 1630) BP: (103-133)/(49-83) 132/76 (06/04 1630) SpO2:  [92 %-96 %] 95 % (06/04 1630) Physical Exam  Constitutional:  oriented to person, place, and time. appears well-developed and well-nourished. No distress.  HENT: [place]/AT, PERRLA, no scleral icterus Mouth/Throat: Oropharynx is clear and moist. No oropharyngeal exudate.  Cardiovascular: Normal rate, regular rhythm and normal heart sounds. Exam reveals no gallop and no friction rub.  No murmur heard.  Pulmonary/Chest: Effort normal and breath sounds normal. No respiratory distress.  has no wheezes. Left chest tube. Serous fluid Neck = supple, no nuchal rigidity Abdominal: Soft. Bowel sounds are normal.  exhibits no distension. There is no tenderness.  Lymphadenopathy: no cervical adenopathy. No axillary adenopathy Neurological: alert and oriented to person, place, and time.  Skin: Skin is warm and dry. No rash noted. No erythema.  Psychiatric: a normal mood and affect.  behavior is normal.    Lab Results Recent Labs    04/18/24 0548 04/19/24 0518  WBC 17.8* 16.3*  HGB 10.9* 12.1  HCT 33.7* 37.8  NA 136 135  K 3.6  3.7  CL 103 101  CO2 24 24  BUN 11 13  CREATININE 0.85 0.88   Liver Panel Recent Labs    04/18/24 0548 04/19/24 0518  PROT 5.9* 6.4*  ALBUMIN 2.1* 2.4*  AST 12* 11*  ALT 15 15  ALKPHOS 73 62  BILITOT 0.4 0.4  BILIDIR 0.1 <0.1  IBILI 0.3 NOT CALCULATED   Sedimentation Rate Recent Labs    04/17/24 0703  ESRSEDRATE 64*   C-Reactive Protein Recent Labs    04/17/24 0703  CRP 19.1*    Microbiology: reviewed Studies/Results: DG CHEST PORT 1 VIEW Result Date: 04/19/2024 CLINICAL DATA:  Pleural effusion EXAM: PORTABLE CHEST 1 VIEW COMPARISON:  Apr 15, 2024 FINDINGS: Left pleural catheter in the left lateral costophrenic sulcus with interval  improvement in the left pleural effusion with persistent left lower lobe retrocardiac opacity indicating atelectasis and/or infiltrates or combination of both with a small residual pleural reaction Right lung free of infiltrates Postsurgical changes of the cervicothoracic spine. Heart normal size No pneumothorax IMPRESSION: Interval improvement in the left pleural effusion with persistent left lower lobe retrocardiac opacity indicating atelectasis and/or infiltrates or combination of both with a small residual pleural reaction. Electronically Signed   By: Fredrich Jefferson M.D.   On: 04/19/2024 08:30   CT [facility] PLEURAL DRAIN W/INDWELL CATH W/IMG GUIDE Result Date: 04/18/2024 INDICATION: 38 year old female with history of left parapneumonic effusion. EXAM: CT PERC PLEURAL DRAIN W/INDWELL CATH W/IMG GUIDE COMPARISON:  None Available. MEDICATIONS: The patient is currently admitted to the hospital and receiving intravenous antibiotics. The antibiotics were administered within an appropriate time frame prior to the initiation of the procedure. ANESTHESIA/SEDATION: Moderate (conscious) sedation was employed during this procedure. A total of Versed  3 mg and Fentanyl  50 mcg was administered intravenously. Moderate Sedation Time: 13 minutes. The patient's level of consciousness and vital signs were monitored continuously by radiology nursing throughout the procedure under my direct supervision. CONTRAST:  None COMPLICATIONS: None immediate. PROCEDURE: RADIATION DOSE REDUCTION: This exam was performed according to the departmental dose-optimization program which includes automated exposure control, adjustment of the mA and/or kV according to patient size and/or use of iterative reconstruction technique. Informed written consent was obtained from the patient after a discussion of the risks, benefits and alternatives to treatment. The patient was placed right lateral decubitus on the CT gantry and a pre procedural CT was performed  re-demonstrating the known fluid collection within the left pleural space. The procedure was planned. A timeout was performed prior to the initiation of the procedure. The left posterolateral and inferior thorax was prepped and draped in the usual sterile fashion. The overlying soft tissues were anesthetized with 1% lidocaine  with epinephrine . Appropriate trajectory was planned with the use of a 22 gauge spinal needle. An 18 gauge trocar needle was advanced into the pleural space and a short Amplatz super stiff wire was coiled within the collection. Appropriate positioning was confirmed with a limited CT scan. The tract was serially dilated allowing placement of a 14 French all-purpose drainage catheter. Appropriate positioning was confirmed with a limited postprocedural CT scan. A total of approximately 100 ml of translucent, straw-colored fluid was initially aspirated. The tube was connected to a pleurovac and sutured in place. A dressing was placed. The patient tolerated the procedure well without immediate post procedural complication. IMPRESSION: Successful CT guided placement of a 79 French all purpose drain catheter into the posterolateral and inferior left pleural space with aspiration of translucent, straw-colored fluid. Creasie Doctor, MD [facility] Electronically Signed   By: Creasie Doctor M.D.   On: 04/18/2024 15:02     Assessment/Plan: Pancreatitis/pancreatic pseudocyst- continueon augmentin and doxycycline  Vaginal yeast infeciton = will give a dose of fluconazole 150mg  x 1  Candidal skin infection = will provide nystatin powder  Pulmonary effusion= continue with chest tube and measure daily output. Hopefully pleural lytics will help.will check cxr tomorrow.  [facility] Pager: [phone]  04/19/2024, 5:59 PM

## 2024-04-19 NOTE — TOC CM/SW Note (Signed)
 Transition of Care [facility]) - Inpatient Brief Assessment   Patient Details  Name: Katrina Lambert MRN: [id number] Date of Birth: 1986/08/21  Transition of Care [facility]) CM/SW Contact:    Juliane Och, LCSW Phone Number: 04/19/2024, 9:27 AM   Clinical Narrative:  9:27 AM Per chart review, patient resides at home with child(ren). Patient's PCP has been established with [facility]. Patient has insurance. Patient does not have SNF or HH history. Patient has DME history with [employer] (RW and 3n1). No TOC needs were identified at this time. TOC will continue to follow and be available to assist.  Transition of Care Asessment: Insurance and Status: Insurance coverage has been reviewed Patient has primary care physician: Yes Home environment has been reviewed: Private Residence Prior level of function:: N/A Prior/Current Home Services: No current home services (Has home DME) Social Drivers of Health Review: SDOH reviewed no interventions necessary Readmission risk has been reviewed: Yes Transition of care needs: no transition of care needs at this time

## 2024-04-19 NOTE — Progress Notes (Signed)
 Chest tube to be unclamped at 1045am

## 2024-04-19 NOTE — Progress Notes (Deleted)
Meal tray setup for pt.

## 2024-04-19 NOTE — Progress Notes (Signed)
 Abdominal xray ordered for pt

## 2024-04-19 NOTE — Progress Notes (Signed)
 Inpatient Progress Note     Patient Profile/Chief Complaint  38 year old female admitted with sepsis, pneumonia, acute pancreatitis with evolving pseudocysts.  Pancreatitis was initially diagnosed and managed at [facility] 03/18/2024.  Etiology of pancreatitis remains unclear and currently idiopathic (normal triglyceride and calcium, no family history of pancreatic disease/cancer, no gallstones).  Patient was on hydrochlorothiazide  been off for many years making this seem less likely.  Admitted to [facility] with cough, fever, abdominal pain, nausea and vomiting diagnosed with community-acquired pneumonia  Evaluated by IR -fluid collections unlikely to represent abscesses and more likely pseudocysts     Interval History   -- Underwent IR placement for chest tube for left pleural effusion 04/18/2024 -- Clinically stable overnight without any acute events -- Reports that her pain is stable -- Has been tolerating a bland diet   Objective   Vital signs in last 24 hours: Temp:  [98 F (36.7 C)-98.3 F (36.8 C)] 98 F (36.7 C) (06/04 1229) Pulse Rate:  [90-104] 90 (06/04 1229) Resp:  [14-18] 17 (06/04 1229) BP: (103-133)/(49-83) 113/49 (06/04 1229) SpO2:  [92 %-96 %] 96 % (06/04 1229) Last BM Date : 04/17/24 General:    Alert, resting in bed, no acute distress Heart:  Regular rate and rhythm; no murmurs Lungs: Respirations even and unlabored, decreased breath sounds at left lung base Abdomen:  Soft, mildly tender to palpation in the mid abdomen and left upper quadrant and nondistended. Normal bowel sounds. Extremities:  Without edema. Neurologic:  Alert and oriented,  grossly normal neurologically. Psych:  Cooperative. Normal mood and affect.  Intake/Output from previous day: 06/03 0701 - 06/04 0700 In: 476 [P.O.:476] Out: 825 [Chest Tube:825] Intake/Output this shift: Total I/O In: -  Out: 20 [Chest Tube:20]  Lab Results: Recent Labs    04/17/24 0703 04/18/24 0548  04/19/24 0518  WBC 19.7* 17.8* 16.3*  HGB 11.0* 10.9* 12.1  HCT 34.3* 33.7* 37.8  PLT 497* 529* 611*   BMET Recent Labs    04/17/24 0703 04/18/24 0548 04/19/24 0518  NA 134* 136 135  K 3.5 3.6 3.7  CL 102 103 101  CO2 22 24 24   GLUCOSE 93 107* 108*  BUN 10 11 13   CREATININE 0.85 0.85 0.88  CALCIUM 8.4* 8.7* 9.0   LFT Recent Labs    04/19/24 0518  PROT 6.4*  ALBUMIN 2.4*  AST 11*  ALT 15  ALKPHOS 62  BILITOT 0.4  BILIDIR <0.1  IBILI NOT CALCULATED   PT/INR No results for input(s): "LABPROT", "INR" in the last 72 hours.  Studies/Results: DG CHEST PORT 1 VIEW Result Date: 04/19/2024 CLINICAL DATA:  Pleural effusion EXAM: PORTABLE CHEST 1 VIEW COMPARISON:  Apr 15, 2024 FINDINGS: Left pleural catheter in the left lateral costophrenic sulcus with interval improvement in the left pleural effusion with persistent left lower lobe retrocardiac opacity indicating atelectasis and/or infiltrates or combination of both with a small residual pleural reaction Right lung free of infiltrates Postsurgical changes of the cervicothoracic spine. Heart normal size No pneumothorax IMPRESSION: Interval improvement in the left pleural effusion with persistent left lower lobe retrocardiac opacity indicating atelectasis and/or infiltrates or combination of both with a small residual pleural reaction. Electronically Signed   By: Fredrich Jefferson M.D.   On: 04/19/2024 08:30   CT [facility] PLEURAL DRAIN W/INDWELL CATH W/IMG GUIDE Result Date: 04/18/2024 INDICATION: 38 year old female with history of left parapneumonic effusion. EXAM: CT PERC PLEURAL DRAIN W/INDWELL CATH W/IMG GUIDE COMPARISON:  None Available. MEDICATIONS: The  patient is currently admitted to the hospital and receiving intravenous antibiotics. The antibiotics were administered within an appropriate time frame prior to the initiation of the procedure. ANESTHESIA/SEDATION: Moderate (conscious) sedation was employed during this procedure. A total of  Versed  3 mg and Fentanyl  50 mcg was administered intravenously. Moderate Sedation Time: 13 minutes. The patient's level of consciousness and vital signs were monitored continuously by radiology nursing throughout the procedure under my direct supervision. CONTRAST:  None COMPLICATIONS: None immediate. PROCEDURE: RADIATION DOSE REDUCTION: This exam was performed according to the departmental dose-optimization program which includes automated exposure control, adjustment of the mA and/or kV according to patient size and/or use of iterative reconstruction technique. Informed written consent was obtained from the patient after a discussion of the risks, benefits and alternatives to treatment. The patient was placed right lateral decubitus on the CT gantry and a pre procedural CT was performed re-demonstrating the known fluid collection within the left pleural space. The procedure was planned. A timeout was performed prior to the initiation of the procedure. The left posterolateral and inferior thorax was prepped and draped in the usual sterile fashion. The overlying soft tissues were anesthetized with 1% lidocaine  with epinephrine . Appropriate trajectory was planned with the use of a 22 gauge spinal needle. An 18 gauge trocar needle was advanced into the pleural space and a short Amplatz super stiff wire was coiled within the collection. Appropriate positioning was confirmed with a limited CT scan. The tract was serially dilated allowing placement of a 14 French all-purpose drainage catheter. Appropriate positioning was confirmed with a limited postprocedural CT scan. A total of approximately 100 ml of translucent, straw-colored fluid was initially aspirated. The tube was connected to a pleurovac and sutured in place. A dressing was placed. The patient tolerated the procedure well without immediate post procedural complication. IMPRESSION: Successful CT guided placement of a 42 French all purpose drain catheter into the  posterolateral and inferior left pleural space with aspiration of translucent, straw-colored fluid. Creasie Doctor, MD [facility] Electronically Signed   By: Creasie Doctor M.D.   On: 04/18/2024 15:02    Endoscopic Studies: None   Clinical Impression   38 year old female admitted with sepsis, pneumonia, acute pancreatitis with evolving pseudocysts.  Pancreatitis was initially diagnosed and managed at [facility] 03/18/2024.  Etiology of pancreatitis remains unclear and currently idiopathic (normal triglyceride and calcium, no family history of pancreatic disease/cancer, no gallstones).  Patient was on hydrochlorothiazide  been off for many years making this seem less likely.  Admitted to [facility] with cough, fever, abdominal pain, nausea and vomiting diagnosed with community-acquired pneumonia.  Reviewed results with patient today and discussed the slow and protracted nature of recovery from pancreatitis complicated by fluid collections/pseudocyst.  These collections are not currently mature enough to be amenable to EUS guided drainage.  Discussed that they could contribute to symptoms by compressing the stomach which may impact nutrition and appetite.  She underwent left chest tube placement 04/18/2024 for left-sided pleural effusion that could be due to pulmonary versus pancreatic etiology.  Fluid studies pending.   Plan  Continue to monitor temperature curve Continue supportive management with IV fluids, analgesics and antiemetics No indication for EUS guided drainage at this time given lack of maturity of pseudocyst Monitor oral intake-discussed that if she is not able to take adequate intake p.o. may need to consider the placement of a Cortrack Continue vitamin supplements Antibiotic management per ID targeted towards pulmonary process  LOS: 4 days   Truddie Furrow  04/19/2024, 4:28 PM  Eugenia Hess, MD [facility]

## 2024-04-20 ENCOUNTER — Inpatient Hospital Stay (HOSPITAL_COMMUNITY): Payer: MEDICAID

## 2024-04-20 DIAGNOSIS — D72829 Elevated white blood cell count, unspecified: Secondary | ICD-10-CM

## 2024-04-20 DIAGNOSIS — K863 Pseudocyst of pancreas: Secondary | ICD-10-CM | POA: Diagnosis not present

## 2024-04-20 DIAGNOSIS — F17210 Nicotine dependence, cigarettes, uncomplicated: Secondary | ICD-10-CM | POA: Diagnosis not present

## 2024-04-20 DIAGNOSIS — J90 Pleural effusion, not elsewhere classified: Secondary | ICD-10-CM | POA: Diagnosis not present

## 2024-04-20 DIAGNOSIS — J189 Pneumonia, unspecified organism: Secondary | ICD-10-CM | POA: Diagnosis not present

## 2024-04-20 DIAGNOSIS — E8809 Other disorders of plasma-protein metabolism, not elsewhere classified: Secondary | ICD-10-CM

## 2024-04-20 DIAGNOSIS — K8500 Idiopathic acute pancreatitis without necrosis or infection: Secondary | ICD-10-CM | POA: Diagnosis not present

## 2024-04-20 DIAGNOSIS — A419 Sepsis, unspecified organism: Secondary | ICD-10-CM | POA: Diagnosis not present

## 2024-04-20 DIAGNOSIS — A403 Sepsis due to Streptococcus pneumoniae: Secondary | ICD-10-CM | POA: Diagnosis not present

## 2024-04-20 LAB — MAGNESIUM: Magnesium: 1.5 mg/dL — ABNORMAL LOW (ref 1.7–2.4)

## 2024-04-20 LAB — CBC
HCT: 37 % (ref 36.0–46.0)
Hemoglobin: 11.8 g/dL — ABNORMAL LOW (ref 12.0–15.0)
MCH: 27.2 pg (ref 26.0–34.0)
MCHC: 31.9 g/dL (ref 30.0–36.0)
MCV: 85.3 fL (ref 80.0–100.0)
Platelets: 605 10*3/uL — ABNORMAL HIGH (ref 150–400)
RBC: 4.34 MIL/uL (ref 3.87–5.11)
RDW: 13.6 % (ref 11.5–15.5)
WBC: 15.6 10*3/uL — ABNORMAL HIGH (ref 4.0–10.5)
nRBC: 0 % (ref 0.0–0.2)

## 2024-04-20 LAB — BASIC METABOLIC PANEL WITH GFR
Anion gap: 10 (ref 5–15)
BUN: 19 mg/dL (ref 6–20)
CO2: 24 mmol/L (ref 22–32)
Calcium: 8.8 mg/dL — ABNORMAL LOW (ref 8.9–10.3)
Chloride: 101 mmol/L (ref 98–111)
Creatinine, Ser: 1.31 mg/dL — ABNORMAL HIGH (ref 0.44–1.00)
GFR, Estimated: 54 mL/min — ABNORMAL LOW (ref >60)
Glucose, Bld: 132 mg/dL — ABNORMAL HIGH (ref 70–99)
Potassium: 3.8 mmol/L (ref 3.5–5.1)
Sodium: 135 mmol/L (ref 135–145)

## 2024-04-20 LAB — PHOSPHORUS: Phosphorus: 5.1 mg/dL — ABNORMAL HIGH (ref 2.5–4.6)

## 2024-04-20 LAB — CULTURE, BLOOD (ROUTINE X 2)
Culture: NO GROWTH
Culture: NO GROWTH

## 2024-04-20 LAB — LIPASE, BLOOD: Lipase: 193 U/L — ABNORMAL HIGH (ref 11–51)

## 2024-04-20 LAB — HEMOGLOBIN A1C
Hgb A1c MFr Bld: 7.3 % — ABNORMAL HIGH (ref 4.8–5.6)
Mean Plasma Glucose: 162.81 mg/dL

## 2024-04-20 MED ORDER — HYDROXYZINE HCL 25 MG PO TABS
25.0000 mg | ORAL_TABLET | Freq: Three times a day (TID) | ORAL | Status: DC | PRN
  Administered 2024-04-20: 25 mg via ORAL
  Filled 2024-04-20: qty 1

## 2024-04-20 MED ORDER — MAGNESIUM SULFATE 4 GM/100ML IV SOLN
4.0000 g | Freq: Once | INTRAVENOUS | Status: AC
  Administered 2024-04-20: 4 g via INTRAVENOUS
  Filled 2024-04-20: qty 100

## 2024-04-20 MED ORDER — AMOXICILLIN-POT CLAVULANATE 875-125 MG PO TABS
1.0000 | ORAL_TABLET | Freq: Two times a day (BID) | ORAL | Status: DC
  Administered 2024-04-20 – 2024-04-25 (×10): 1 via ORAL
  Filled 2024-04-20 (×10): qty 1

## 2024-04-20 MED ORDER — SIMETHICONE 80 MG PO CHEW
80.0000 mg | CHEWABLE_TABLET | Freq: Four times a day (QID) | ORAL | Status: DC | PRN
  Administered 2024-04-20 – 2024-04-25 (×8): 80 mg via ORAL
  Filled 2024-04-20 (×8): qty 1

## 2024-04-20 NOTE — Progress Notes (Signed)
 Calorie Count Note  Patient felling a lot better compared to yesterday, still with some abdominal pain from bloating. She reports her appetite is increasing had 25% of a magic cup on visit. RD took patients breakfast order. Encouraged pt to increase PO intake today. Suspect pt will do well today.  48 hour calorie count ordered.  Diet: Heart Healthy diet, thin liquids  Supplements: Magic cup, Ensure High protein   Breakfast: Apple  Lunch: 50% chicken, 100% mashed potatoes, bites of corn Dinner: 25% [place], mashed potatoes, gravy, green beans Supplements: 1.5 Ensures, 0 Magic cups   Total intake: 875 kcal (42% of minimum estimated needs)  50 gm protein (42% of minimum estimated needs)  Estimated Nutritional Needs:  Kcal:  2100-2300 kcal Protein:  120-140 gm Fluid:  >2L/day  INTERVENTION:    48 hour calorie count  Encourage PO intake as allows Continue Heart healthy diet  Ensure Enlive po BID, each supplement provides 350 kcal and 20 grams of protein. Magic cup TID with meals, each supplement provides 290 kcal and 9 grams of protein MVI with minerals daily 100 mg Thiamine  daily Continue Vitamin B12- repletion   Continue 50,000 units Vitamin D every 7 days x 8 weeks for deficiency followed by 1,000 units Vitamin D daily for maintenance  Monitor magnesium, potassium, and phosphorus BID for at least 3 days, MD to replete as needed, as pt is at risk for refeeding syndrome   If continued poor PO intake recommend initiation of enteral feedings within 48 hours.    NUTRITION DIAGNOSIS:    Severe Malnutrition related to acute illness as evidenced by mild muscle depletion, energy intake < or equal to 50% for > or equal to 5 days, percent weight loss (20 lb, 7% weight loss in 1 month.).   - Still applicable    GOAL:    Patient will meet greater than or equal to 90% of their needs   - Progressing   Frederik Jansky, RD Registered Dietitian  See Amion for more information

## 2024-04-20 NOTE — Progress Notes (Signed)
 Inpatient Progress Note     Patient Profile/Chief Complaint  38 year old female admitted with sepsis, pneumonia, acute pancreatitis with evolving pseudocysts.  Pancreatitis was initially diagnosed and managed at [facility] 03/18/2024.  Etiology of pancreatitis remains unclear and currently idiopathic (normal triglyceride and calcium, no family history of pancreatic disease/cancer, no gallstones).  Patient was on hydrochlorothiazide  been off for many years making this seem less likely.  Admitted to [facility] with cough, fever, abdominal pain, nausea and vomiting diagnosed with community-acquired pneumonia  Evaluated by IR -fluid collections unlikely to represent abscesses and more likely pseudocysts     Interval History   -- Underwent IR placement for chest tube for left pleural effusion 04/18/2024 -- Clinically stable overnight without any acute events -- Noted right upper quadrant pain after eating [place] sausage today -- Reports having a normal bowel movement yesterday   Objective   Vital signs in last 24 hours: Temp:  [97.6 F (36.4 C)-98.4 F (36.9 C)] 97.7 F (36.5 C) (06/05 1252) Pulse Rate:  [81-111] 98 (06/05 1252) Resp:  [17-28] 19 (06/05 1252) BP: (53-132)/(24-78) 111/78 (06/05 1252) SpO2:  [90 %-95 %] 95 % (06/05 1252) Last BM Date : 04/19/24 General:    Alert, resting in bed, no acute distress but appears uncomfortable Heart:  Regular rate and rhythm; no murmurs Lungs: Respirations even and unlabored, decreased breath sounds at left lung base Abdomen:  Soft, mildly tender to palpation in the mid abdomen, right upper quadrant and left upper quadrant and nondistended. Normal bowel sounds. Extremities:  Without edema. Neurologic:  Alert and oriented,  grossly normal neurologically. Psych:  Cooperative. Normal mood and affect.  Intake/Output from previous day: 06/04 0701 - 06/05 0700 In: 977 [P.O.:720] Out: 120 [Chest Tube:120] Intake/Output this shift: Total  I/O In: 240 [P.O.:240] Out: -   Lab Results: Recent Labs    04/18/24 0548 04/19/24 0518 04/20/24 0637  WBC 17.8* 16.3* 15.6*  HGB 10.9* 12.1 11.8*  HCT 33.7* 37.8 37.0  PLT 529* 611* 605*   BMET Recent Labs    04/18/24 0548 04/19/24 0518 04/20/24 0637  NA 136 135 135  K 3.6 3.7 3.8  CL 103 101 101  CO2 24 24 24   GLUCOSE 107* 108* 132*  BUN 11 13 19   CREATININE 0.85 0.88 1.31*  CALCIUM 8.7* 9.0 8.8*   LFT Recent Labs    04/19/24 0518  PROT 6.4*  ALBUMIN 2.4*  AST 11*  ALT 15  ALKPHOS 62  BILITOT 0.4  BILIDIR <0.1  IBILI NOT CALCULATED   PT/INR No results for input(s): "LABPROT", "INR" in the last 72 hours.  Studies/Results: DG CHEST PORT 1 VIEW Result Date: 04/20/2024 CLINICAL DATA:  Pleural effusion EXAM: PORTABLE CHEST 1 VIEW COMPARISON:  April 19, 2024 FINDINGS: Mild bilateral reticular interstitial infiltrates correlate with congestive changes. Left pleural catheter in place in the lateral costophrenic sulcus with a small left pleural effusion and reaction and hypoventilatory atelectasis of the left lower lobe. Small right pleural effusion. No pneumothorax. IMPRESSION: *Mild congestive changes. *Left pleural catheter in place with a small left pleural effusion and hypoventilatory atelectasis of the left lower lobe. Electronically Signed   By: Fredrich Jefferson M.D.   On: 04/20/2024 08:52   DG Abd 1 View Result Date: 04/19/2024 CLINICAL DATA:  Intractable abdominal pain EXAM: ABDOMEN - 1 VIEW COMPARISON:  Abdominal x-ray 12/07/2023. FINDINGS: Pleural drainage catheter projects over the lower left hemithorax. Bowel gas pattern is nonobstructive. IUD is present in the pelvis.  Cholecystectomy clips are also present. No suspicious calcifications are identified. No acute fractures are seen. IMPRESSION: Nonobstructive bowel gas pattern. Electronically Signed   By: Tyron Gallon M.D.   On: 04/19/2024 22:33   DG CHEST PORT 1 VIEW Result Date: 04/19/2024 CLINICAL DATA:  Pleural  effusion EXAM: PORTABLE CHEST 1 VIEW COMPARISON:  Apr 15, 2024 FINDINGS: Left pleural catheter in the left lateral costophrenic sulcus with interval improvement in the left pleural effusion with persistent left lower lobe retrocardiac opacity indicating atelectasis and/or infiltrates or combination of both with a small residual pleural reaction Right lung free of infiltrates Postsurgical changes of the cervicothoracic spine. Heart normal size No pneumothorax IMPRESSION: Interval improvement in the left pleural effusion with persistent left lower lobe retrocardiac opacity indicating atelectasis and/or infiltrates or combination of both with a small residual pleural reaction. Electronically Signed   By: Fredrich Jefferson M.D.   On: 04/19/2024 08:30   CT [facility] PLEURAL DRAIN W/INDWELL CATH W/IMG GUIDE Result Date: 04/18/2024 INDICATION: 38 year old female with history of left parapneumonic effusion. EXAM: CT PERC PLEURAL DRAIN W/INDWELL CATH W/IMG GUIDE COMPARISON:  None Available. MEDICATIONS: The patient is currently admitted to the hospital and receiving intravenous antibiotics. The antibiotics were administered within an appropriate time frame prior to the initiation of the procedure. ANESTHESIA/SEDATION: Moderate (conscious) sedation was employed during this procedure. A total of Versed  3 mg and Fentanyl  50 mcg was administered intravenously. Moderate Sedation Time: 13 minutes. The patient's level of consciousness and vital signs were monitored continuously by radiology nursing throughout the procedure under my direct supervision. CONTRAST:  None COMPLICATIONS: None immediate. PROCEDURE: RADIATION DOSE REDUCTION: This exam was performed according to the departmental dose-optimization program which includes automated exposure control, adjustment of the mA and/or kV according to patient size and/or use of iterative reconstruction technique. Informed written consent was obtained from the patient after a discussion of the  risks, benefits and alternatives to treatment. The patient was placed right lateral decubitus on the CT gantry and a pre procedural CT was performed re-demonstrating the known fluid collection within the left pleural space. The procedure was planned. A timeout was performed prior to the initiation of the procedure. The left posterolateral and inferior thorax was prepped and draped in the usual sterile fashion. The overlying soft tissues were anesthetized with 1% lidocaine  with epinephrine . Appropriate trajectory was planned with the use of a 22 gauge spinal needle. An 18 gauge trocar needle was advanced into the pleural space and a short Amplatz super stiff wire was coiled within the collection. Appropriate positioning was confirmed with a limited CT scan. The tract was serially dilated allowing placement of a 14 French all-purpose drainage catheter. Appropriate positioning was confirmed with a limited postprocedural CT scan. A total of approximately 100 ml of translucent, straw-colored fluid was initially aspirated. The tube was connected to a pleurovac and sutured in place. A dressing was placed. The patient tolerated the procedure well without immediate post procedural complication. IMPRESSION: Successful CT guided placement of a 45 French all purpose drain catheter into the posterolateral and inferior left pleural space with aspiration of translucent, straw-colored fluid. Creasie Doctor, MD [facility] Electronically Signed   By: Creasie Doctor M.D.   On: 04/18/2024 15:02    Endoscopic Studies: None   Clinical Impression   38 year old female admitted with sepsis, pneumonia, acute pancreatitis with evolving pseudocysts.  Pancreatitis was initially diagnosed and managed at [facility] 03/18/2024.  Etiology of pancreatitis remains unclear and  currently idiopathic (normal triglyceride and calcium, no family history of pancreatic disease/cancer, no  gallstones).  Patient was on hydrochlorothiazide  been off for many years making this seem less likely.  Admitted to [facility] with cough, fever, abdominal pain, nausea and vomiting diagnosed with community-acquired pneumonia.  Previously reviewed with patient the slow and protracted nature of recovery from pancreatitis complicated by fluid collections/pseudocyst.  These collections are not currently mature enough to be amenable to EUS guided drainage.  Discussed that they could contribute to symptoms by compressing the stomach which may impact nutrition and appetite.  Pain is currently stable.   She underwent left chest tube placement 04/18/2024 for left-sided pleural effusion that could be due to pulmonary versus pancreatic etiology.  Fluid studies pending.   Plan  Continue to monitor temperature curve No need to check lipase on a daily basis as it will not impact clinical management Continue supportive management with IV fluids, analgesics and antiemetics No indication for EUS guided drainage at this time given lack of maturity of pseudocyst Monitor oral intake-discussed that if she is not able to take adequate intake p.o. may need to consider the placement of a Cortrack Continue vitamin supplements Antibiotic management per ID targeted towards pulmonary process If worsening abdominal pain or fever would recommend updated CTAP to ensure there is not evolution of necrotic necrosis or infection  Dr. General Kenner will assume rounding responsibilities for [facility] 04/21/2024   LOS: 5 days   Truddie Furrow  04/20/2024, 2:35 PM  Eugenia Hess, MD [facility]

## 2024-04-20 NOTE — Progress Notes (Signed)
 Katrina Lambert  HQI:[id number] DOB: 1986-04-23 DOA: 04/15/2024 PCP: Aurelio Blower [facility]    Brief Narrative:  38 year old with a history of anxiety/depression, bipolar disorder, GERD, morbid obesity, prediabetes, cervical myelopathy, and tobacco abuse who presented to the ED 5/31 with cough, dyspnea on exertion, and back pain.  She had recently undergone a 3-day hospitalization at [facility] for left lower lobe pneumonia, having been discharged approximately 2 prior to this presentation.  In the ER a CXR noted a moderate left pleural effusion with bibasilar atelectasis versus infiltrate.  Goals of Care:   Code Status: Full Code   DVT prophylaxis: enoxaparin (LOVENOX) injection 40 mg Start: 04/15/24 2100   Interim Hx: Reported severe diffuse acute abdominal pain last night.  KUB revealed no evidence of ileus or free air.  CXR this morning appears stable.  Resting much more comfortably at the time of visit today.  Has no new complaints.  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 Was initially admitted at [facility] 03/18/24 with acute pancreatitis  -inciting etiology unclear - CT abdomen/pelvis this admission noted persisting evidence of pancreatic inflammation and lipase was elevated at 146 - General Surgery has evaluated the patient and does not feel that any acute intervention is indicated - GI recommended  ongoing symptomatic treatment with outpatient follow-up once pseudocyst have matured  HTN Blood pressure presently well-controlled  PCOS Continue usual metformin dose  Prediabetes > DM2 A1c 7.3 during this admission thereby meeting criteria for formal diagnosis of DM2 - continue metformin -follow CBGs closely  Hypomagnesemia Recurring due to poor intake -continue to supplement  Iron deficiency Iron quite low at 17 with low TIBC at 190 likely due to ongoing smoldering infection and acute pancreatitis -avoiding IV iron in setting of acute infection  B12 deficiency B12 markedly low at 80 -supplementation ongoing  Vitamin D deficiency 25-hydroxy vitamin D low at 14 -supplementation initiated  Tobacco abuse Advised that she must discontinue smoking entirely  Class III obesity - Body mass index is 43.29 kg/m.   Family Communication: No family present at time of exam Disposition: Eventual discharge home   Objective: Blood pressure 103/63, pulse 98, temperature 98.2 F (36.8 C), temperature source Oral, resp. rate 18, height 5\' 5"  (1.651 m), weight 118 kg, SpO2 92%.  Intake/Output Summary (Last 24 hours) at 04/20/2024 1039 Last data filed at 04/20/2024 0641 Gross per 24 hour  Intake 977 ml  Output 100 ml  Net 877 ml   Filed Weights   04/15/24 1728  Weight: 118 kg    Examination: General: No acute respiratory distress Lungs: Poor air movement in left base but good air movement throughout other fields without wheezing Cardiovascular: Regular rate and rhythm without murmur gallop or rub normal S1 and S2 Abdomen: Nontender, nondistended, soft, bowel sounds positive, no rebound, no ascites, no appreciable mass Extremities: No significant cyanosis, clubbing, or edema bilateral lower extremities  CBC: Recent Labs  Lab 04/18/24 0548 04/19/24 0518 04/20/24 0637  WBC 17.8* 16.3* 15.6*  HGB 10.9* 12.1 11.8*  HCT 33.7* 37.8 37.0  MCV 84.7 84.6 85.3  PLT 529* 611* 605*    Basic Metabolic Panel: Recent Labs  Lab 04/18/24 0548 04/19/24 0518 04/20/24 0637  NA 136 135 135  K 3.6 3.7 3.8  CL 103 101 101  CO2 24 24 24   GLUCOSE 107* 108* 132*  BUN 11 13 19   CREATININE 0.85 0.88 1.31*  CALCIUM 8.7* 9.0 8.8*  MG 1.7 1.7 1.5*  PHOS 4.9* 4.7* 5.1*   GFR: Estimated Creatinine Clearance: 75.6 mL/min (A) (by C-G formula based on SCr of 1.31 mg/dL (H)).   Scheduled Meds:  acetaminophen   650 mg Oral TID   alteplase (CATHFLO ACTIVASE) 10 mg in sodium chloride  (PF) 0.9 % 30 mL  10 mg Intrapleural Once   And   dornase alfa (PULMOZYME) 5 mg in sterile water (preservative free) 30 mL  5 mg Intrapleural Once   amoxicillin-clavulanate  1 tablet Oral Q12H   ascorbic acid  500 mg Oral Daily   cyanocobalamin  1,000 mcg Intramuscular Daily   Followed by   Cecily Cohen ON 04/23/2024] vitamin B-12  1,000 mcg Oral Daily   doxycycline  100 mg Oral Q12H   enoxaparin (LOVENOX) injection  40 mg Subcutaneous Q24H   feeding supplement  237 mL Oral TID BM   folic acid  1 mg Oral Daily   iron polysaccharides  150 mg Oral Daily   metFORMIN  500 mg Oral BID WC   multivitamin with minerals  1 tablet Oral Daily   mupirocin ointment   Topical BID   nicotine   21 mg Transdermal Daily   nystatin   Topical BID   potassium chloride  20 mEq Oral Daily   sodium chloride  flush  10 mL Intrapleural Q8H   thiamine   100 mg Oral Daily   umeclidinium-vilanterol  1 puff Inhalation Daily   Vitamin D (Ergocalciferol)  50,000 Units Oral Q7 days      LOS: 5 days   Abbe Abate, MD [facility] Office  [phone] Pager - Text Page per Tilford Foley  If 7PM-7AM, please contact night-coverage per Amion 04/20/2024, 10:39 AM

## 2024-04-20 NOTE — Plan of Care (Signed)
  Problem: Clinical Measurements: Goal: Ability to maintain clinical measurements within normal limits will improve Outcome: Progressing Goal: Will remain free from infection Outcome: Progressing Goal: Diagnostic test results will improve Outcome: Progressing Goal: Respiratory complications will improve Outcome: Progressing Goal: Cardiovascular complication will be avoided Outcome: Progressing   Problem: Activity: Goal: Risk for activity intolerance will decrease Outcome: Progressing   Problem: Nutrition: Goal: Adequate nutrition will be maintained Outcome: Progressing   Problem: Coping: Goal: Level of anxiety will decrease Outcome: Progressing   Problem: Pain Managment: Goal: General experience of comfort will improve and/or be controlled Outcome: Progressing   Problem: Safety: Goal: Ability to remain free from injury will improve Outcome: Progressing

## 2024-04-20 NOTE — Progress Notes (Signed)
 NAME:  Katrina Lambert, MRN:  [id number], DOB:  Jan 26, 1986, LOS: 5 ADMISSION DATE:  04/15/2024, CONSULTATION DATE:  04/16/24 REFERRING MD:  Althia Atlas, MD CHIEF COMPLAINT:  pneumonia   History of Present Illness:  Katrina Lambert is a 38 year old woman daily smoker with history of hypertension, obesity, bipolar disorder, GERD and asthma who is admitted for sepsis due to pneumonia and possible pancreatitis.   PCCM consulted for pneumonia and left pleural effusion. She was treated for pneumonia 2 weeks ago at [facility].   CT Chest shows partially loculated left pleural effusion. Findings of acute pancreatitis and multiple fluid enhancing collections in the left upper quadrant. 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  ID consulted, recommended to complete course of doxycycline and augmentin.   Patient reports cough, sputum production and some wheezing along with dyspnea. Denies hemoptysis. She feels like she has subjective fevers.  Pertinent  Medical History   Past Medical History:  Diagnosis Date   Anxiety    Arthritis    bilateral hips and knees   Asthma    Bipolar disorder (HCC)    Depression    Dyspnea    GERD (gastroesophageal reflux disease)    Headache    Hypertension    Neuromuscular disorder (HCC)    PONV (postoperative nausea and vomiting)    Pre-diabetes     Significant Hospital Events: Including procedures, antibiotic start and stop dates in addition to other pertinent events   5/31 admitted to hospital 6/1 PCCM, IR, ID consulted 6/3 chest tube placed by IR, no pleural fluid sent for studies 6/4 pleural lytic dose 1  Interim History / Subjective:   Chest tube drained in past 24 hours Pleural lytics given yesterday Chest x-ray improved Her breathing feels better   Objective    Blood pressure 111/78, pulse 98, temperature 97.7 F (36.5 C), temperature source Oral,  resp. rate 19, height 5\' 5"  (1.651 m), weight 118 kg, SpO2 95%.        Intake/Output Summary (Last 24 hours) at 04/20/2024 1316 Last data filed at 04/20/2024 1100 Gross per 24 hour  Intake 977 ml  Output 100 ml  Net 877 ml   Filed Weights   04/15/24 1728  Weight: 118 kg    Examination: General: young woman, obese, no acute distress HENT: [place]/AT, moist mucous membranes Lungs: no wheezing, chest tube in place Cardiovascular: rrr, no murmurs Abdomen: soft, mildly tender Extremities: warm, no edema Neuro: alert, oriented moving all extremities GU: n/a  Bedside US  6/2 Moderate sized left pleural effusion  Resolved problem list   Assessment and Plan   Sepsis due to pancreatitis Left Partially Loculated Pleural Effusion  Anemia Thrombocytosis  Plan: - Continue chest tube to -20cmH2O suction - instilled pleural lytics 6/4 - repeat chest x-ray tomorrow AM - will plan to remove chest tube tomorrow morning if of drainage or less - unfortunately no pleural fluid studies sent, concern for parapneumonic effusion vs effusion from abdominal pathology from pancreatitis. - Continue antibiotics - continue anoro inhaler for asthma, avoiding inhaled steroids to due possible infection - use as needed albuterol   PCCM will continue to follow    Best Practice (right click and "Reselect all SmartList Selections" daily)   Per primary  Labs   CBC: Recent Labs  Lab 04/16/24 0631 04/17/24 0703 04/18/24 0548 04/19/24 0518 04/20/24 0637  WBC 16.2* 19.7* 17.8* 16.3* 15.6*  HGB 10.9* 11.0* 10.9* 12.1 11.8*  HCT 33.8* 34.3* 33.7* 37.8 37.0  MCV 84.7 85.1 84.7 84.6 85.3  PLT 505* 497* 529* 611* 605*    Basic Metabolic Panel: Recent Labs  Lab 04/16/24 0631 04/16/24 0924 04/17/24 0703 04/18/24 0548 04/19/24 0518 04/20/24 0637  NA 136  --  134* 136 135 135  K 3.4*  --  3.5 3.6 3.7 3.8  CL 103  --  102 103 101 101  CO2 22  --  22 24 24 24   GLUCOSE 108*  --  93 107* 108*  132*  BUN 12  --  10 11 13 19   CREATININE 0.93  --  0.85 0.85 0.88 1.31*  CALCIUM 8.6*  --  8.4* 8.7* 9.0 8.8*  MG  --  1.8 1.6* 1.7 1.7 1.5*  PHOS  --  4.3 4.9* 4.9* 4.7* 5.1*   GFR: Estimated Creatinine Clearance: 75.6 mL/min (A) (by C-G formula based on SCr of 1.31 mg/dL (H)). Recent Labs  Lab 04/15/24 2020 04/16/24 0631 04/17/24 0703 04/18/24 0548 04/19/24 0518 04/20/24 0637  PROCALCITON 0.75  --   --   --   --   --   WBC  --    < > 19.7* 17.8* 16.3* 15.6*   < > = values in this interval not displayed.    Liver Function Tests: Recent Labs  Lab 04/15/24 1731 04/17/24 0703 04/18/24 0548 04/19/24 0518  AST 23 12* 12* 11*  ALT 26 21 15 15   ALKPHOS 83 91 73 62  BILITOT 0.5 0.5 0.4 0.4  PROT 6.7 6.0* 5.9* 6.4*  ALBUMIN 2.4* 2.2* 2.1* 2.4*   Recent Labs  Lab 04/15/24 1731 04/17/24 0703 04/18/24 0548 04/19/24 0518 04/20/24 0637  LIPASE 146* 135* 122* 159* 193*   No results for input(s): "AMMONIA" in the last 168 hours.  ABG No results found for: "PHART", "PCO2ART", "PO2ART", "HCO3", "TCO2", "ACIDBASEDEF", "O2SAT"   Coagulation Profile: No results for input(s): "INR", "PROTIME" in the last 168 hours.  Cardiac Enzymes: No results for input(s): "CKTOTAL", "CKMB", "CKMBINDEX", "TROPONINI" in the last 168 hours.  HbA1C: Hgb A1c MFr Bld  Date/Time Value Ref Range Status  04/20/2024 06:37 AM 7.3 (H) 4.8 - 5.6 % Final    Comment:    (NOTE) Diagnosis of Diabetes The following HbA1c ranges recommended by the American Diabetes Association (ADA) may be used as an aid in the diagnosis of diabetes mellitus.  Hemoglobin             Suggested A1C NGSP%              Diagnosis  <5.7                   Non Diabetic  5.7-6.4                Pre-Diabetic  >6.4                   Diabetic  <7.0                   Glycemic control for                       adults with diabetes.    05/30/2021 01:55 PM 5.7 (H) 4.8 - 5.6 % Final    Comment:    (NOTE) Pre diabetes:           5.7%-6.4%  Diabetes:              >  6.4%  Glycemic control for   <7.0% adults with diabetes     CBG: No results for input(s): "GLUCAP" in the last 168 hours.     Critical care time: n/a    Duaine German, MD [facility] Office: [phone]   See Amion for personal pager PCCM on call pager [phone] until 7pm. Please call Elink 7p-7a. [phone]

## 2024-04-20 NOTE — Progress Notes (Signed)
 [facility]  Date of Admission:  04/15/2024     Reason for Follow Up: Sepsis due to pneumonia Katrina Lambert)  Total days of antibiotics 6         ASSESSMENT:  Katrina Lambert is s/p chest tube placement in the setting of loculated pleural effusion and pancreatitis. Tolerating antibiotics with no adverse side effects. Has new right sided lower abdominal pain of unclear origin and will continue to monitor. Unfortunately no cultures were sent from chest tube placement. Discussed plan of care to continue with chest tube and antibiotics. Will continue doxycycline for 2 more days and then stop and narrow to amoxicillin-clavulanate. Continue chest tube per IR/PCCM. Standard/universal precautions. Remaining medical and supportive care per Internal Medicine.   PLAN:  Continue current dose of amoxicillin/clavulanate Continue doxycycline for 2 more days then discontinue. Chest tube management per IR/PCCM.  Standard/universal precautions.  Remaining medical and supportive care per Internal Medicine.   Principal Problem:   Sepsis due to pneumonia [facility]) Active Problems:   Acute pancreatitis   Intra-abdominal abscess (HCC)   Pleural effusion on left   Hospital-acquired pneumonia   Tobacco use disorder   Upper abdominal pain   Nausea and vomiting in adult   Loss of appetite   Abnormal loss of weight   Protein-calorie malnutrition, severe    acetaminophen   650 mg Oral TID   amoxicillin-clavulanate  1 tablet Oral Q12H   ascorbic acid  500 mg Oral Daily   cyanocobalamin  1,000 mcg Intramuscular Daily   Followed by   Cecily Cohen ON 04/23/2024] vitamin B-12  1,000 mcg Oral Daily   doxycycline  100 mg Oral Q12H   enoxaparin (LOVENOX) injection  40 mg Subcutaneous Q24H   feeding supplement  237 mL Oral TID BM   folic acid  1 mg Oral Daily   iron polysaccharides  150 mg Oral Daily   metFORMIN  500 mg Oral BID WC   multivitamin with minerals  1 tablet Oral Daily   mupirocin ointment    Topical BID   nicotine   21 mg Transdermal Daily   nystatin   Topical BID   potassium chloride  20 mEq Oral Daily   sodium chloride  flush  10 mL Intrapleural Q8H   thiamine   100 mg Oral Daily   umeclidinium-vilanterol  1 puff Inhalation Daily   Vitamin D (Ergocalciferol)  50,000 Units Oral Q7 days    SUBJECTIVE:  Afebrile overnight with no acute events. Having chest wall pain although breathing is improved. Now with right side pain similar to the left although may be stomach related. Tolerating antibiotics with no adverse side effects.   No Known Allergies   Review of Systems: Review of Systems  Constitutional:  Negative for chills, fever and weight loss.  Respiratory:  Negative for cough, shortness of breath and wheezing.   Cardiovascular:  Negative for chest pain and leg swelling.  Gastrointestinal:  Negative for abdominal pain, constipation, diarrhea, nausea and vomiting.  Skin:  Negative for rash.      OBJECTIVE: Vitals:   04/20/24 0455 04/20/24 0824 04/20/24 0842 04/20/24 1252  BP: 105/69  103/63 111/78  Pulse: (!) 109  98 98  Resp: 17  18 19   Temp: 98 F (36.7 C)  98.2 F (36.8 C) 97.7 F (36.5 C)  TempSrc: Oral  Oral Oral  SpO2: 92% 90% 92% 95%  Weight:      Height:       Body mass index is 43.29 kg/m.  Physical Exam Constitutional:      General: She is not in acute distress.    Appearance: She is well-developed.  Cardiovascular:     Rate and Rhythm: Normal rate and regular rhythm.     Heart sounds: Normal heart sounds.  Pulmonary:     Effort: Pulmonary effort is normal.     Breath sounds: Normal breath sounds.     Comments: Chest tube in place to suction with no air leak Skin:    General: Skin is warm and dry.  Neurological:     Mental Status: She is alert and oriented to person, place, and time.     Lab Results Lab Results  Component Value Date   WBC 15.6 (H) 04/20/2024   HGB 11.8 (L) 04/20/2024   HCT 37.0 04/20/2024   MCV 85.3 04/20/2024    PLT 605 (H) 04/20/2024    Lab Results  Component Value Date   CREATININE 1.31 (H) 04/20/2024   BUN 19 04/20/2024   NA 135 04/20/2024   K 3.8 04/20/2024   CL 101 04/20/2024   CO2 24 04/20/2024    Lab Results  Component Value Date   ALT 15 04/19/2024   AST 11 (L) 04/19/2024   ALKPHOS 62 04/19/2024   BILITOT 0.4 04/19/2024     Microbiology: Recent Results (from the past 240 hours)  Blood culture (routine x 2)     Status: None   Collection Time: 04/15/24  8:15 PM   Specimen: BLOOD RIGHT HAND  Result Value Ref Range Status   Specimen Description BLOOD RIGHT HAND  Final   Special Requests   Final    BOTTLES DRAWN AEROBIC AND ANAEROBIC Blood Culture results may not be optimal due to an inadequate volume of blood received in culture bottles   Culture   Final    NO GROWTH 5 DAYS Performed at [facility], [address] [place], [place] [address]    Report Status 04/20/2024 FINAL  Final  Blood culture (routine x 2)     Status: None   Collection Time: 04/15/24  8:20 PM   Specimen: BLOOD LEFT ARM  Result Value Ref Range Status   Specimen Description BLOOD LEFT ARM  Final   Special Requests   Final    BOTTLES DRAWN AEROBIC AND ANAEROBIC Blood Culture results may not be optimal due to an inadequate volume of blood received in culture bottles   Culture   Final    NO GROWTH 5 DAYS Performed at [facility], [address], [place], [place] [address]    Report Status 04/20/2024 FINAL  Final  MRSA Next Gen by PCR, Nasal     Status: None   Collection Time: 04/15/24 11:13 PM  Result Value Ref Range Status   MRSA by PCR Next Gen NOT DETECTED NOT DETECTED Final    Comment: (NOTE) The GeneXpert MRSA Assay (FDA approved for NASAL specimens only), is one component of a comprehensive MRSA colonization surveillance program. It is not intended to diagnose MRSA infection nor to guide or monitor treatment for MRSA infections. Test performance is not FDA approved in patients  less than 3 years old. Performed at [facility], [address] [place], [place] [address]   Respiratory (~20 pathogens) panel by PCR     Status: None   Collection Time: 04/16/24  1:59 PM   Specimen: Nasopharyngeal Swab; Respiratory  Result Value Ref Range Status   Adenovirus NOT DETECTED NOT DETECTED Final  Coronavirus 229E NOT DETECTED NOT DETECTED Final    Comment: (NOTE) The Coronavirus on the Respiratory Panel, DOES NOT test for the novel  Coronavirus (2019 nCoV)    Coronavirus HKU1 NOT DETECTED NOT DETECTED Final   Coronavirus NL63 NOT DETECTED NOT DETECTED Final   Coronavirus OC43 NOT DETECTED NOT DETECTED Final   Metapneumovirus NOT DETECTED NOT DETECTED Final   Rhinovirus / Enterovirus NOT DETECTED NOT DETECTED Final   Influenza A NOT DETECTED NOT DETECTED Final   Influenza B NOT DETECTED NOT DETECTED Final   Parainfluenza Virus 1 NOT DETECTED NOT DETECTED Final   Parainfluenza Virus 2 NOT DETECTED NOT DETECTED Final   Parainfluenza Virus 3 NOT DETECTED NOT DETECTED Final   Parainfluenza Virus 4 NOT DETECTED NOT DETECTED Final   Respiratory Syncytial Virus NOT DETECTED NOT DETECTED Final   Bordetella pertussis NOT DETECTED NOT DETECTED Final   Bordetella Parapertussis NOT DETECTED NOT DETECTED Final   Chlamydophila pneumoniae NOT DETECTED NOT DETECTED Final   Mycoplasma pneumoniae NOT DETECTED NOT DETECTED Final    Comment: Performed at [facility], [address], [place], [place] [address]    I have personally spent 28 minutes involved in face-to-face and non-face-to-face activities for this patient on the day of the visit. Professional time spent includes the following activities: Preparing to see the patient (review of tests), performing a medically appropriate examination, ordering medications, communicating with other health care professionals, documenting clinical information in the EMR, communicating results and counseling patient regarding  medication and plan of care, and care coordination.    Greg Raymont Andreoni, NP [facility]  04/20/2024  2:24 PM

## 2024-04-20 NOTE — Plan of Care (Signed)

## 2024-04-21 ENCOUNTER — Inpatient Hospital Stay (HOSPITAL_COMMUNITY): Payer: MEDICAID

## 2024-04-21 DIAGNOSIS — K8500 Idiopathic acute pancreatitis without necrosis or infection: Secondary | ICD-10-CM | POA: Diagnosis not present

## 2024-04-21 DIAGNOSIS — K863 Pseudocyst of pancreas: Secondary | ICD-10-CM | POA: Diagnosis not present

## 2024-04-21 DIAGNOSIS — J90 Pleural effusion, not elsewhere classified: Secondary | ICD-10-CM | POA: Diagnosis not present

## 2024-04-21 DIAGNOSIS — F17210 Nicotine dependence, cigarettes, uncomplicated: Secondary | ICD-10-CM | POA: Diagnosis not present

## 2024-04-21 DIAGNOSIS — J189 Pneumonia, unspecified organism: Secondary | ICD-10-CM | POA: Diagnosis not present

## 2024-04-21 DIAGNOSIS — D72829 Elevated white blood cell count, unspecified: Secondary | ICD-10-CM | POA: Diagnosis not present

## 2024-04-21 DIAGNOSIS — A403 Sepsis due to Streptococcus pneumoniae: Secondary | ICD-10-CM | POA: Diagnosis not present

## 2024-04-21 DIAGNOSIS — A419 Sepsis, unspecified organism: Secondary | ICD-10-CM | POA: Diagnosis not present

## 2024-04-21 LAB — CBC
HCT: 36.5 % (ref 36.0–46.0)
Hemoglobin: 11.6 g/dL — ABNORMAL LOW (ref 12.0–15.0)
MCH: 27.2 pg (ref 26.0–34.0)
MCHC: 31.8 g/dL (ref 30.0–36.0)
MCV: 85.5 fL (ref 80.0–100.0)
Platelets: 653 10*3/uL — ABNORMAL HIGH (ref 150–400)
RBC: 4.27 MIL/uL (ref 3.87–5.11)
RDW: 13.8 % (ref 11.5–15.5)
WBC: 17.2 10*3/uL — ABNORMAL HIGH (ref 4.0–10.5)
nRBC: 0 % (ref 0.0–0.2)

## 2024-04-21 LAB — COMPREHENSIVE METABOLIC PANEL WITH GFR
ALT: 11 U/L (ref 0–44)
AST: 9 U/L — ABNORMAL LOW (ref 15–41)
Albumin: 2.1 g/dL — ABNORMAL LOW (ref 3.5–5.0)
Alkaline Phosphatase: 60 U/L (ref 38–126)
Anion gap: 11 (ref 5–15)
BUN: 21 mg/dL — ABNORMAL HIGH (ref 6–20)
CO2: 24 mmol/L (ref 22–32)
Calcium: 8.8 mg/dL — ABNORMAL LOW (ref 8.9–10.3)
Chloride: 99 mmol/L (ref 98–111)
Creatinine, Ser: 1.17 mg/dL — ABNORMAL HIGH (ref 0.44–1.00)
GFR, Estimated: >60 mL/min (ref >60)
Glucose, Bld: 106 mg/dL — ABNORMAL HIGH (ref 70–99)
Potassium: 3.9 mmol/L (ref 3.5–5.1)
Sodium: 134 mmol/L — ABNORMAL LOW (ref 135–145)
Total Bilirubin: 0.3 mg/dL (ref 0.0–1.2)
Total Protein: 5.9 g/dL — ABNORMAL LOW (ref 6.5–8.1)

## 2024-04-21 LAB — BODY FLUID CELL COUNT WITH DIFFERENTIAL
Eos, Fluid: 45 %
Lymphs, Fluid: 36 %
Monocyte-Macrophage-Serous Fluid: 9 % — ABNORMAL LOW (ref 50–90)
Neutrophil Count, Fluid: 5 % (ref 0–25)
Total Nucleated Cell Count, Fluid: 629 uL (ref 0–1000)

## 2024-04-21 LAB — PROTEIN, PLEURAL OR PERITONEAL FLUID: Total protein, fluid: <3 g/dL

## 2024-04-21 LAB — GLUCOSE, PLEURAL OR PERITONEAL FLUID: Glucose, Fluid: 61 mg/dL

## 2024-04-21 LAB — LACTATE DEHYDROGENASE, PLEURAL OR PERITONEAL FLUID: LD, Fluid: 245 U/L — ABNORMAL HIGH (ref 3–23)

## 2024-04-21 LAB — MAGNESIUM: Magnesium: 2.4 mg/dL (ref 1.7–2.4)

## 2024-04-21 NOTE — Progress Notes (Signed)
 [facility]    Date of Admission:  04/15/2024      ID: Katrina Lambert is a 38 y.o. female with  hx of sepsis from pancreatitis and pseudocyst and left partially loculated pleural effusion Principal Problem:   Sepsis due to pneumonia [facility]) Active Problems:   Acute pancreatitis   Intra-abdominal abscess (HCC)   Pleural effusion on left   Hospital-acquired pneumonia   Tobacco use disorder   Upper abdominal pain   Nausea and vomiting in adult   Loss of appetite   Abnormal loss of weight   Protein-calorie malnutrition, severe    Subjective: Afebrile. But having some referred left shoulder/back pain this morning  Medications:   acetaminophen   650 mg Oral TID   amoxicillin-clavulanate  1 tablet Oral Q12H   ascorbic acid  500 mg Oral Daily   cyanocobalamin  1,000 mcg Intramuscular Daily   Followed by   Cecily Cohen ON 04/23/2024] vitamin B-12  1,000 mcg Oral Daily   doxycycline  100 mg Oral Q12H   enoxaparin (LOVENOX) injection  40 mg Subcutaneous Q24H   feeding supplement  237 mL Oral TID BM   folic acid  1 mg Oral Daily   iron polysaccharides  150 mg Oral Daily   metFORMIN  500 mg Oral BID WC   multivitamin with minerals  1 tablet Oral Daily   mupirocin ointment   Topical BID   nicotine   21 mg Transdermal Daily   nystatin   Topical BID   potassium chloride  20 mEq Oral Daily   sodium chloride  flush  10 mL Intrapleural Q8H   thiamine   100 mg Oral Daily   umeclidinium-vilanterol  1 puff Inhalation Daily   Vitamin D (Ergocalciferol)  50,000 Units Oral Q7 days    Objective: Vital signs in last 24 hours: Temp:  [97.7 F (36.5 C)-98.2 F (36.8 C)] 98.2 F (36.8 C) (06/06 0854) Pulse Rate:  [98-116] 106 (06/06 0854) Resp:  [16-19] 17 (06/06 0854) BP: (104-130)/(64-78) 122/70 (06/06 0854) SpO2:  [93 %-97 %] 94 % (06/06 0854) Physical Exam  Constitutional: He is oriented to person, place, and time. He appears well-developed and well-nourished. No  distress.  HENT:  Mouth/Throat: Oropharynx is clear and moist. No oropharyngeal exudate.  Cardiovascular: Normal rate, regular rhythm and normal heart sounds. Exam reveals no gallop and no friction rub.  No murmur heard.  Pulmonary/Chest: tachypnic;Effort normal and breath sounds normal. No respiratory distress. He has no wheezes. Left pigtail catheter is kinked Neurological: He is alert and oriented to person, place, and time.  Skin: Skin is warm and dry. No rash noted. No erythema.  Psychiatric: normal mood and affect. His behavior is normal.    Lab Results Recent Labs    04/20/24 0637 04/21/24 0618  WBC 15.6* 17.2*  HGB 11.8* 11.6*  HCT 37.0 36.5  NA 135 134*  K 3.8 3.9  CL 101 99  CO2 24 24  BUN 19 21*  CREATININE 1.31* 1.17*   Liver Panel Recent Labs    04/19/24 0518 04/21/24 0618  PROT 6.4* 5.9*  ALBUMIN 2.4* 2.1*  AST 11* 9*  ALT 15 11  ALKPHOS 62 60  BILITOT 0.4 0.3  BILIDIR <0.1  --   IBILI NOT CALCULATED  --      Microbiology: reviewed Studies/Results: DG CHEST PORT 1 VIEW Result Date: 04/21/2024 CLINICAL DATA:  [id number] Chest tube in place [id number] EXAM: PORTABLE CHEST - 1 VIEW COMPARISON:  04/20/2024 FINDINGS: Lower  lung volumes. Left pleural drainage catheter is similarly positioned in the lung base. Subsegmental atelectasis in both lung bases. Trace left pleural effusion. No pneumothorax. Mild cardiomegaly. IMPRESSION: 1. Little significant interval change to the lungs and trace left pleural effusion. 2. Similar positioning of the left pleural drainage catheter . Electronically Signed   By: Rance Burrows M.D.   On: 04/21/2024 08:12   DG CHEST PORT 1 VIEW Result Date: 04/20/2024 CLINICAL DATA:  Pleural effusion EXAM: PORTABLE CHEST 1 VIEW COMPARISON:  April 19, 2024 FINDINGS: Mild bilateral reticular interstitial infiltrates correlate with congestive changes. Left pleural catheter in place in the lateral costophrenic sulcus with a small left pleural effusion  and reaction and hypoventilatory atelectasis of the left lower lobe. Small right pleural effusion. No pneumothorax. IMPRESSION: *Mild congestive changes. *Left pleural catheter in place with a small left pleural effusion and hypoventilatory atelectasis of the left lower lobe. Electronically Signed   By: Fredrich Jefferson M.D.   On: 04/20/2024 08:52   DG Abd 1 View Result Date: 04/19/2024 CLINICAL DATA:  Intractable abdominal pain EXAM: ABDOMEN - 1 VIEW COMPARISON:  Abdominal x-ray 12/07/2023. FINDINGS: Pleural drainage catheter projects over the lower left hemithorax. Bowel gas pattern is nonobstructive. IUD is present in the pelvis. Cholecystectomy clips are also present. No suspicious calcifications are identified. No acute fractures are seen. IMPRESSION: Nonobstructive bowel gas pattern. Electronically Signed   By: Tyron Gallon M.D.   On: 04/19/2024 22:33     Assessment/Plan: Loculated pleural effusion = currently being drained with pigtail catheter. Agree with plan per dr dewald to remove once output < . Currently day 7 of amox/clav and doxycycline. Recommend to complete 10 day course  Pancreatic pseudocyst = likely causing referred pain for her. Defer to primary team for management. Have her follow up with GI as outpatient to repeat imaging and see if needs further intervention  Leukocytosis = likely from inflammation of underlying pancreatitis/no fevers to suggest new infection  Hypoalbunemia =as precursor for nutritional status, recommend to increase nutritional intake as tolerated.  Will sign off.  [facility] Pager: [phone]  04/21/2024, 10:42 AM

## 2024-04-21 NOTE — Progress Notes (Signed)
 Progress Note   Subjective  Patient tolerating low fat diet. States her abdomen has some tightness but not much pain. Fluid analysis pending from chest tube / pleural fluid.    Objective   Vital signs in last 24 hours: Temp:  [98 F (36.7 C)-98.2 F (36.8 C)] 98.2 F (36.8 C) (06/06 0854) Pulse Rate:  [99-116] 106 (06/06 0854) Resp:  [16-19] 17 (06/06 0854) BP: (104-130)/(64-78) 122/70 (06/06 0854) SpO2:  [93 %-97 %] 94 % (06/06 0854) Last BM Date : 04/19/24 General:    white female in NAD Neurologic:  Alert and oriented,  grossly normal neurologically. Psych:  Cooperative. Normal mood and affect.  Intake/Output from previous day: 06/05 0701 - 06/06 0700 In: 350 [P.O.:240; I.V.:10; IV Piggyback:100] Out: 0  Intake/Output this shift: No intake/output data recorded.  Lab Results: Recent Labs    04/19/24 0518 04/20/24 0637 04/21/24 0618  WBC 16.3* 15.6* 17.2*  HGB 12.1 11.8* 11.6*  HCT 37.8 37.0 36.5  PLT 611* 605* 653*   BMET Recent Labs    04/19/24 0518 04/20/24 0637 04/21/24 0618  NA 135 135 134*  K 3.7 3.8 3.9  CL 101 101 99  CO2 24 24 24   GLUCOSE 108* 132* 106*  BUN 13 19 21*  CREATININE 0.88 1.31* 1.17*  CALCIUM 9.0 8.8* 8.8*   LFT Recent Labs    04/19/24 0518 04/21/24 0618  PROT 6.4* 5.9*  ALBUMIN 2.4* 2.1*  AST 11* 9*  ALT 15 11  ALKPHOS 62 60  BILITOT 0.4 0.3  BILIDIR <0.1  --   IBILI NOT CALCULATED  --    PT/INR No results for input(s): "LABPROT", "INR" in the last 72 hours.  Studies/Results: DG CHEST PORT 1 VIEW Result Date: 04/21/2024 CLINICAL DATA:  [id number] Chest tube in place [id number] EXAM: PORTABLE CHEST - 1 VIEW COMPARISON:  04/20/2024 FINDINGS: Lower lung volumes. Left pleural drainage catheter is similarly positioned in the lung base. Subsegmental atelectasis in both lung bases. Trace left pleural effusion. No pneumothorax. Mild cardiomegaly. IMPRESSION: 1. Little significant interval change to the lungs and trace left  pleural effusion. 2. Similar positioning of the left pleural drainage catheter . Electronically Signed   By: Rance Burrows M.D.   On: 04/21/2024 08:12   DG CHEST PORT 1 VIEW Result Date: 04/20/2024 CLINICAL DATA:  Pleural effusion EXAM: PORTABLE CHEST 1 VIEW COMPARISON:  April 19, 2024 FINDINGS: Mild bilateral reticular interstitial infiltrates correlate with congestive changes. Left pleural catheter in place in the lateral costophrenic sulcus with a small left pleural effusion and reaction and hypoventilatory atelectasis of the left lower lobe. Small right pleural effusion. No pneumothorax. IMPRESSION: *Mild congestive changes. *Left pleural catheter in place with a small left pleural effusion and hypoventilatory atelectasis of the left lower lobe. Electronically Signed   By: Fredrich Jefferson M.D.   On: 04/20/2024 08:52   DG Abd 1 View Result Date: 04/19/2024 CLINICAL DATA:  Intractable abdominal pain EXAM: ABDOMEN - 1 VIEW COMPARISON:  Abdominal x-ray 12/07/2023. FINDINGS: Pleural drainage catheter projects over the lower left hemithorax. Bowel gas pattern is nonobstructive. IUD is present in the pelvis. Cholecystectomy clips are also present. No suspicious calcifications are identified. No acute fractures are seen. IMPRESSION: Nonobstructive bowel gas pattern. Electronically Signed   By: Tyron Gallon M.D.   On: 04/19/2024 22:33       Assessment / Plan:    38 y/o female here with the following:  Pancreatitis - idiopathic at this time  Pancreatic pseudocyst / peripancreatic fluid collection Pleural effusion  She is tolerating a low fat diet. Has some tightness in her abdomen but no significant pain. Fluid analysis pending from pleural fluid, hopefully drain / tube can be removed in upcoming days. We have asked the lab to check a lipase level to see if this is pancreatic in etiology.  Otherwise, as long as she is tolerating diet and pain improving, which she states it is, we do not need to reimage at  this time. IR has no plans for fluid aspiration / drainage as long as she is clinically improving. Would reserve that for interval worsening. She will need follow up imaging as outpatient once she is through this hospitalization, in the next several weeks. We can coordinate that (Dr. Dominic Friendly).   We will sign off for now, please call with questions. Our office will coordinate outpatient follow up.  Christi Coward, MD [facility]

## 2024-04-21 NOTE — Progress Notes (Signed)
 Calorie Count Note  48 hour calorie count ordered. RD went to follow-up on calorie count ordered. One meal ticket in envelope with no items documented with completions. One Ensure supplement is documented as given with in the past 24 hours, although unsure of completion. RN unsure of pt PO intake this morning, pt was eating breakfast and gave her an Ensure when she passed morning meds.   Diet: Heart Healthy Supplements: Magic cup, Ensure High protein   Estimated Nutritional Needs:  Kcal:  2100-2300 kcal Protein:  120-140 gm Fluid:  >2L/day  Nutrition Diagnosis: Severe Malnutrition related to acute illness as evidenced by mild muscle depletion, energy intake < or equal to 50% for > or equal to 5 days, percent weight loss (20 lb, 7% weight loss in 1 month.).   Goal: Patient will meet greater than or equal to 90% of their needs   Intervention:  Ensure Enlive po BID, each supplement provides 350 kcal and 20 grams of protein. Magic cup TID with meals, each supplement provides 290 kcal and 9 grams of protein MVI with minerals daily Continue Calorie Count to better assess PO intake.    Doneta Furbish RD, LDN Clinical Dietitian

## 2024-04-21 NOTE — Progress Notes (Signed)
 Katrina Lambert  ZOX:[id number] DOB: 1986/08/04 DOA: 04/15/2024 PCP: [facility]    Brief Narrative:  38 year old with a history of anxiety/depression, bipolar disorder, GERD, morbid obesity, prediabetes, cervical myelopathy, and tobacco abuse who presented to the ED 5/31 with cough, dyspnea on exertion, and back pain.  She had recently undergone a 3-day hospitalization at [facility] for left lower lobe pneumonia, having been discharged approximately 2 weeks prior to this presentation.  In the ER a CXR noted a moderate left pleural effusion with bibasilar atelectasis versus infiltrate.  Goals of Care:   Code Status: Full Code   DVT prophylaxis: enoxaparin (LOVENOX) injection 40 mg Start: 04/15/24 2100   Interim Hx: No acute events recorded overnight.  Afebrile.  Vital signs stable.  Oxygen saturation 94% room air.  In good spirits today.  Tells me she is feeling better overall.  No new complaints.  Assessment & Plan:  Healthcare acquired pneumonia with sepsis Patient met sepsis criteria at presentation with tachycardia, tachypnea, leukocytosis, and evidence of a pulmonary infiltrate -has been treated with broad-spectrum IV antibiotics - sepsis physiology has resolved  Left pleural effusion Due to persisting respiratory symptoms CT of the chest was accomplished which noted a loculated left-sided pleural effusion - Pulmonary recommended chest tube insertion which was completed by IR 6/3 - thrombolytics were instilled in her tube by PCCM 6/4 -expect PCCM to discontinue her chest tube today  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 Was initially admitted at [facility] 03/18/24 with acute pancreatitis  -inciting etiology unclear - CT abdomen/pelvis this admission noted persisting evidence of pancreatic inflammation and lipase was elevated at 146 - General Surgery evaluated the patient and did not feel that any acute intervention was indicated -  GI recommended ongoing symptomatic treatment with outpatient follow-up once pseudocyst have matured  HTN Blood pressure presently well-controlled  PCOS Continue usual metformin dose  Prediabetes > DM2 A1c 7.3 during this admission thereby meeting criteria for formal diagnosis of DM2 - continue metformin -follow CBGs closely  Hypomagnesemia Recurring due to poor intake -continue to supplement  Iron deficiency Iron quite low at 17 with low TIBC at 190 likely due to ongoing smoldering infection and acute pancreatitis - avoiding IV iron in setting of acute infection  B12 deficiency B12 markedly low at 80 - supplementation ongoing  Vitamin D deficiency 25-hydroxy vitamin D low at 14 -supplementation initiated  Tobacco abuse Advised that she must discontinue smoking entirely  Class III obesity - Body mass index is 43.29 kg/m.   Family Communication: No family present at time of exam Disposition: Eventual discharge home   Objective: Blood pressure 122/70, pulse (!) 106, temperature 98.2 F (36.8 C), temperature source Oral, resp. rate 17, height 5\' 5"  (1.651 m), weight 118 kg, SpO2 94%.  Intake/Output Summary (Last 24 hours) at 04/21/2024 1046 Last data filed at 04/21/2024 0700 Gross per 24 hour  Intake 350.01 ml  Output 0 ml  Net 350.01 ml   Filed Weights   04/15/24 1728  Weight: 118 kg    Examination: General: No acute respiratory distress Lungs: Improved air movement throughout all fields with no wheezing Cardiovascular: Regular rate and rhythm without murmur  Abdomen: Nontender, nondistended, soft, bowel sounds positive, no rebound, no ascites, no appreciable mass Extremities: Trace edema bilateral lower extremities  CBC: Recent Labs  Lab 04/19/24 0518 04/20/24 0637 04/21/24 0618  WBC 16.3* 15.6* 17.2*  HGB 12.1 11.8* 11.6*  HCT 37.8 37.0 36.5  MCV 84.6 85.3 85.5  PLT 611* 605* 653*   Basic Metabolic Panel: Recent Labs  Lab 04/18/24 0548 04/19/24 0518  04/20/24 0637 04/21/24 0618  NA 136 135 135 134*  K 3.6 3.7 3.8 3.9  CL 103 101 101 99  CO2 24 24 24 24   GLUCOSE 107* 108* 132* 106*  BUN 11 13 19  21*  CREATININE 0.85 0.88 1.31* 1.17*  CALCIUM 8.7* 9.0 8.8* 8.8*  MG 1.7 1.7 1.5* 2.4  PHOS 4.9* 4.7* 5.1*  --    GFR: Estimated Creatinine Clearance: 84.6 mL/min (A) (by C-G formula based on SCr of 1.17 mg/dL (H)).   Scheduled Meds:  acetaminophen   650 mg Oral TID   amoxicillin-clavulanate  1 tablet Oral Q12H   ascorbic acid  500 mg Oral Daily   cyanocobalamin  1,000 mcg Intramuscular Daily   Followed by   Cecily Cohen ON 04/23/2024] vitamin B-12  1,000 mcg Oral Daily   doxycycline  100 mg Oral Q12H   enoxaparin (LOVENOX) injection  40 mg Subcutaneous Q24H   feeding supplement  237 mL Oral TID BM   folic acid  1 mg Oral Daily   iron polysaccharides  150 mg Oral Daily   metFORMIN  500 mg Oral BID WC   multivitamin with minerals  1 tablet Oral Daily   mupirocin ointment   Topical BID   nicotine   21 mg Transdermal Daily   nystatin   Topical BID   potassium chloride  20 mEq Oral Daily   sodium chloride  flush  10 mL Intrapleural Q8H   thiamine   100 mg Oral Daily   umeclidinium-vilanterol  1 puff Inhalation Daily   Vitamin D (Ergocalciferol)  50,000 Units Oral Q7 days      LOS: 6 days   Abbe Abate, MD [facility] Office  [phone] Pager - Text Page per Tilford Foley  If 7PM-7AM, please contact night-coverage per Amion 04/21/2024, 10:46 AM

## 2024-04-21 NOTE — Progress Notes (Signed)
 Pleural specimen sent to main lab, per Dr. Celene Coins called IR to have chest tube removed.

## 2024-04-21 NOTE — Plan of Care (Signed)
  Problem: Activity: Goal: Risk for activity intolerance will decrease Outcome: Progressing   Problem: Nutrition: Goal: Adequate nutrition will be maintained Outcome: Progressing   Problem: Coping: Goal: Level of anxiety will decrease Outcome: Progressing   Problem: Elimination: Goal: Will not experience complications related to bowel motility Outcome: Progressing Goal: Will not experience complications related to urinary retention Outcome: Progressing   Problem: Pain Managment: Goal: General experience of comfort will improve and/or be controlled Outcome: Progressing   Problem: Safety: Goal: Ability to remain free from injury will improve Outcome: Progressing   Problem: Skin Integrity: Goal: Risk for impaired skin integrity will decrease Outcome: Progressing   Problem: Activity: Goal: Ability to tolerate increased activity will improve Outcome: Progressing

## 2024-04-21 NOTE — Progress Notes (Signed)
 NAME:  Katrina Lambert, MRN:  [id number], DOB:  04/14/1986, LOS: 6 ADMISSION DATE:  04/15/2024, CONSULTATION DATE:  04/16/24 REFERRING MD:  Althia Atlas, MD CHIEF COMPLAINT:  pneumonia   History of Present Illness:  Katrina Lambert is a 38 year old woman daily smoker with history of hypertension, obesity, bipolar disorder, GERD and asthma who is admitted for sepsis due to pneumonia and possible pancreatitis.   PCCM consulted for pneumonia and left pleural effusion. She was treated for pneumonia 2 weeks ago at [facility].   CT Chest shows partially loculated left pleural effusion. Findings of acute pancreatitis and multiple fluid enhancing collections in the left upper quadrant. 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  ID consulted, recommended to complete course of doxycycline and augmentin.   Patient reports cough, sputum production and some wheezing along with dyspnea. Denies hemoptysis. She feels like she has subjective fevers.  Pertinent  Medical History   Past Medical History:  Diagnosis Date   Anxiety    Arthritis    bilateral hips and knees   Asthma    Bipolar disorder (HCC)    Depression    Dyspnea    GERD (gastroesophageal reflux disease)    Headache    Hypertension    Neuromuscular disorder (HCC)    PONV (postoperative nausea and vomiting)    Pre-diabetes     Significant Hospital Events: Including procedures, antibiotic start and stop dates in addition to other pertinent events   5/31 admitted to hospital 6/1 PCCM, IR, ID consulted 6/3 chest tube placed by IR, no pleural fluid sent for studies 6/4 pleural lytic dose 1  Interim History / Subjective:   Minimal chest tube drainage Complains of soreness at chest tube site Afebrile   Objective    Blood pressure 122/70, pulse (!) 106, temperature 98.2 F (36.8 C), temperature source Oral, resp. rate 17, height 5\' 5"  (1.651 m),  weight 118 kg, SpO2 94%.        Intake/Output Summary (Last 24 hours) at 04/21/2024 1347 Last data filed at 04/21/2024 0700 Gross per 24 hour  Intake 110.01 ml  Output 0 ml  Net 110.01 ml   Filed Weights   04/15/24 1728  Weight: 118 kg    Examination: General: young woman, obese, no acute distress HENT: [place]/AT, moist mucous membranes Lungs: Decreased breath sounds bilateral, pigtail site no swelling or erythema Cardiovascular: rrr, no murmurs Abdomen: soft, mildly tender Extremities: warm, no edema Neuro: alert, oriented moving all extremities GU: n/a   Labs show mild hyponatremia, slight increase in leukocytosis Chest x-ray shows trace left pleural effusion with bibasilar atelectasis  Resolved problem list   Assessment and Plan   Sepsis due to pancreatitis Left Partially Loculated Pleural Effusion -- unfortunately no pleural fluid studies sent, concern for parapneumonic effusion  from pancreatitis s/p lytics 6/4 Anemia Thrombocytosis  Plan: - Send pleural fluid for testing, then can ask IR to DC chest tube  - Continue antibiotics as outlined by ID - continue anoro inhaler for asthma, avoiding inhaled steroids to due possible infection - use as needed albuterol   PCCM will be available as needed    Best Practice (right click and "Reselect all SmartList Selections" daily)   Per primary  Labs   CBC: Recent Labs  Lab 04/17/24 0703 04/18/24 0548 04/19/24 0518 04/20/24 0637 04/21/24 0618  WBC 19.7* 17.8* 16.3* 15.6* 17.2*  HGB 11.0* 10.9* 12.1 11.8* 11.6*  HCT 34.3* 33.7* 37.8 37.0 36.5  MCV 85.1 84.7 84.6 85.3 85.5  PLT 497* 529* 611* 605* 653*    Basic Metabolic Panel: Recent Labs  Lab 04/16/24 0924 04/17/24 0703 04/18/24 0548 04/19/24 0518 04/20/24 0637 04/21/24 0618  NA  --  134* 136 135 135 134*  K  --  3.5 3.6 3.7 3.8 3.9  CL  --  102 103 101 101 99  CO2  --  22 24 24 24 24   GLUCOSE  --  93 107* 108* 132* 106*  BUN  --  10 11 13 19  21*   CREATININE  --  0.85 0.85 0.88 1.31* 1.17*  CALCIUM  --  8.4* 8.7* 9.0 8.8* 8.8*  MG 1.8 1.6* 1.7 1.7 1.5* 2.4  PHOS 4.3 4.9* 4.9* 4.7* 5.1*  --    GFR: Estimated Creatinine Clearance: 84.6 mL/min (A) (by C-G formula based on SCr of 1.17 mg/dL (H)). Recent Labs  Lab 04/15/24 2020 04/16/24 0631 04/18/24 0548 04/19/24 0518 04/20/24 1610 04/21/24 0618  PROCALCITON 0.75  --   --   --   --   --   WBC  --    < > 17.8* 16.3* 15.6* 17.2*   < > = values in this interval not displayed.    Liver Function Tests: Recent Labs  Lab 04/15/24 1731 04/17/24 0703 04/18/24 0548 04/19/24 0518 04/21/24 0618  AST 23 12* 12* 11* 9*  ALT 26 21 15 15 11   ALKPHOS 83 91 73 62 60  BILITOT 0.5 0.5 0.4 0.4 0.3  PROT 6.7 6.0* 5.9* 6.4* 5.9*  ALBUMIN 2.4* 2.2* 2.1* 2.4* 2.1*   Recent Labs  Lab 04/15/24 1731 04/17/24 0703 04/18/24 0548 04/19/24 0518 04/20/24 0637  LIPASE 146* 135* 122* 159* 193*   No results for input(s): "AMMONIA" in the last 168 hours.  ABG No results found for: "PHART", "PCO2ART", "PO2ART", "HCO3", "TCO2", "ACIDBASEDEF", "O2SAT"   Coagulation Profile: No results for input(s): "INR", "PROTIME" in the last 168 hours.  Cardiac Enzymes: No results for input(s): "CKTOTAL", "CKMB", "CKMBINDEX", "TROPONINI" in the last 168 hours.  HbA1C: Hgb A1c MFr Bld  Date/Time Value Ref Range Status  04/20/2024 06:37 AM 7.3 (H) 4.8 - 5.6 % Final    Comment:    (NOTE) Diagnosis of Diabetes The following HbA1c ranges recommended by the American Diabetes Association (ADA) may be used as an aid in the diagnosis of diabetes mellitus.  Hemoglobin             Suggested A1C NGSP%              Diagnosis  <5.7                   Non Diabetic  5.7-6.4                Pre-Diabetic  >6.4                   Diabetic  <7.0                   Glycemic control for                       adults with diabetes.    05/30/2021 01:55 PM 5.7 (H) 4.8 - 5.6 % Final    Comment:    (NOTE) Pre  diabetes:          5.7%-6.4%  Diabetes:              >  6.4%  Glycemic control for   <7.0% adults with diabetes     CBG: No results for input(s): "GLUCAP" in the last 168 hours.   Jennifer Moellers Villa Greaser MD [facility] Office: [phone]   See Amion for personal pager PCCM on call pager [phone] until 7pm. Please call Elink 7p-7a. [phone]

## 2024-04-21 NOTE — Progress Notes (Signed)
 Notified IR to pull chest tube. Spoke with Concha Deed, she stated that tube may have to be pulled tomorrow because PAs may have left for today. Notified Dr. Celene Coins.

## 2024-04-22 DIAGNOSIS — J90 Pleural effusion, not elsewhere classified: Secondary | ICD-10-CM | POA: Diagnosis not present

## 2024-04-22 DIAGNOSIS — K8500 Idiopathic acute pancreatitis without necrosis or infection: Secondary | ICD-10-CM

## 2024-04-22 LAB — GLUCOSE, CAPILLARY
Glucose-Capillary: 80 mg/dL (ref 70–99)
Glucose-Capillary: 94 mg/dL (ref 70–99)
Glucose-Capillary: 91 mg/dL (ref 70–99)

## 2024-04-22 MED ORDER — INSULIN ASPART 100 UNIT/ML IJ SOLN: 0.0000 [IU] | Freq: Every day | INTRAMUSCULAR | Status: DC

## 2024-04-22 MED ORDER — INSULIN ASPART 100 UNIT/ML IJ SOLN
0.0000 [IU] | Freq: Three times a day (TID) | INTRAMUSCULAR | Status: DC
  Administered 2024-04-24 – 2024-04-26 (×2): 1 [IU] via SUBCUTANEOUS

## 2024-04-22 NOTE — Progress Notes (Signed)
 Katrina Lambert  ZOX:[id number] DOB: [birth year] DOA: 04/15/2024 PCP: Aurelio Blower [facility]    Brief Narrative:  38 year old with a history of anxiety/depression, bipolar disorder, GERD, morbid obesity, prediabetes, cervical myelopathy, and tobacco abuse who presented to the ED 5/31 with cough, dyspnea on exertion, and back pain.  She had recently undergone a 3-day hospitalization at [facility] for left lower lobe pneumonia, having been discharged approximately 2 weeks prior to this presentation.  In the ER a CXR noted a moderate left pleural effusion with bibasilar atelectasis versus infiltrate.  Goals of Care:   Code Status: Full Code   DVT prophylaxis: enoxaparin  (LOVENOX ) injection 40 mg Start: 04/15/24 2100   Interim Hx: Chest tube was not able to be removed yesterday as IR staff had already left for the day.  No acute events reported overnight.  Vital signs stable.  Chest tube has been removed by the time of my visit today.  Patient reports that she is feeling much better.  Appetite is improving.  Still having some intermittent abdominal pain but nothing that she reports as severe.  Resting comfortably in bed.  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 Was initially admitted at [facility] 03/18/24 with acute pancreatitis  -inciting etiology unclear - CT abdomen/pelvis  this admission noted persisting evidence of pancreatic inflammation and lipase was elevated at 146 - General Surgery evaluated the patient and did not feel that any acute intervention was indicated - GI recommended ongoing symptomatic treatment with outpatient follow-up once pseudocyst have matured  HTN Blood pressure presently well-controlled  PCOS Continue usual metformin  dose  Prediabetes > DM2 A1c 7.3 during this admission thereby meeting criteria for formal diagnosis of DM2 - continue metformin  -follow CBGs closely  Hypomagnesemia Corrected with supplementation  Iron  deficiency Iron  quite low at 17 with low TIBC at 190 likely due to ongoing smoldering infection and acute pancreatitis - avoiding IV iron  in setting of acute infection  B12 deficiency B12 markedly low at 80 - supplementation ongoing  Vitamin D  deficiency 25-hydroxy vitamin D  low at 14 -supplementation initiated  Tobacco abuse Advised that she must discontinue smoking entirely  Class III obesity - Body mass index is 43.29 kg/m.   Family Communication: No family present at time of exam Disposition: Discharge home, likely in next 48 hours   Objective: Blood pressure 121/64, pulse (!) 102, temperature 98.1 F (36.7 C), temperature source Oral, resp. rate 17, height 5\' 5"  (1.651 m), weight 118 kg, SpO2 96%.  Intake/Output Summary (Last 24 hours) at 04/22/2024 1026 Last data filed at 04/22/2024 0809 Gross per 24 hour  Intake 370 ml  Output --  Net 370 ml   Filed Weights   04/15/24 1728  Weight: 118 kg    Examination: General: No acute respiratory distress at rest in bed Lungs: Improved air movement throughout all fields with no wheezing Cardiovascular: Regular rate and rhythm without murmur  Abdomen: Obese, soft, bowel sounds positive, no rebound Extremities: Trace edema bilateral lower extremities  CBC: Recent Labs  Lab 04/19/24 0518 04/20/24 0637 04/21/24 0618  WBC 16.3* 15.6* 17.2*  HGB 12.1  11.8* 11.6*  HCT 37.8 37.0 36.5  MCV 84.6 85.3 85.5  PLT 611* 605* 653*   Basic Metabolic Panel: Recent Labs  Lab 04/18/24 0548 04/19/24 0518 04/20/24 0637 04/21/24 0618  NA 136 135 135 134*  K 3.6 3.7 3.8 3.9  CL 103 101 101 99  CO2 24 24 24 24   GLUCOSE 107* 108* 132* 106*  BUN 11 13 19  21*  CREATININE 0.85 0.88 1.31* 1.17*  CALCIUM 8.7* 9.0 8.8* 8.8*  MG 1.7 1.7 1.5* 2.4  PHOS 4.9* 4.7* 5.1*  --    GFR: Estimated Creatinine Clearance: 84.6 mL/min (A) (by C-G formula based on SCr of 1.17 mg/dL (H)).   Scheduled Meds:  acetaminophen   650 mg Oral TID   amoxicillin -clavulanate  1 tablet Oral Q12H   ascorbic acid   500 mg Oral Daily   [START ON 04/23/2024] vitamin B-12  1,000 mcg Oral Daily   doxycycline   100 mg Oral Q12H   enoxaparin  (LOVENOX ) injection  40 mg Subcutaneous Q24H   feeding supplement  237 mL Oral TID BM   folic acid   1 mg Oral Daily   iron  polysaccharides  150 mg Oral Daily   metFORMIN   500 mg Oral BID WC   multivitamin with minerals  1 tablet Oral Daily   mupirocin  ointment   Topical BID   nicotine   21 mg Transdermal Daily   nystatin    Topical BID   potassium chloride   20 mEq Oral Daily   sodium chloride  flush  10 mL Intrapleural Q8H   thiamine   100 mg Oral Daily   umeclidinium-vilanterol  1 puff Inhalation Daily   Vitamin D  (Ergocalciferol )  50,000 Units Oral Q7 days      LOS: 7 days   Abbe Abate, MD [facility] Office  [phone] Pager - Text Page per Tilford Foley  If 7PM-7AM, please contact night-coverage per Amion 04/22/2024, 10:26 AM

## 2024-04-22 NOTE — Plan of Care (Signed)
  Problem: Education: Goal: Knowledge of General Education information will improve Description: Including pain rating scale, medication(s)/side effects and non-pharmacologic comfort measures Outcome: Progressing   Problem: Coping: Goal: Level of anxiety will decrease Outcome: Progressing   Problem: Pain Managment: Goal: General experience of comfort will improve and/or be controlled Outcome: Progressing

## 2024-04-22 NOTE — Plan of Care (Signed)
  Problem: Education: Goal: Knowledge of General Education information will improve Description: Including pain rating scale, medication(s)/side effects and non-pharmacologic comfort measures Outcome: Progressing   Problem: Activity: Goal: Risk for activity intolerance will decrease Outcome: Progressing   Problem: Pain Managment: Goal: General experience of comfort will improve and/or be controlled Outcome: Progressing   Problem: Safety: Goal: Ability to remain free from injury will improve Outcome: Progressing   Problem: Respiratory: Goal: Ability to maintain adequate ventilation will improve Outcome: Progressing

## 2024-04-22 NOTE — Progress Notes (Signed)
 Patient ID: MAKEDA PEEKS, female   DOB: 15-Nov-1986, 38 y.o.   MRN: [id number]  Patient seen today for left chest tube removal per request of pulmonology.  Left chest tube removed at bedside without difficulty, no complication.   Site dressed with Vaseline gauze, 4x4 gauze, and covered with skin tape.   Damian Duke Janney Priego PA-C 04/22/2024 12:49 PM

## 2024-04-22 NOTE — Progress Notes (Signed)
 NAME:  Katrina Lambert, MRN:  [id number], DOB:  [birth year], LOS: 7 ADMISSION DATE:  04/15/2024, CONSULTATION DATE:  04/16/24 REFERRING MD:  Althia Atlas, MD CHIEF COMPLAINT:  pneumonia   History of Present Illness:  Katrina Lambert is a 38 year old woman daily smoker with history of hypertension, obesity, bipolar disorder, GERD and asthma who is admitted for sepsis due to pneumonia and possible pancreatitis.   PCCM consulted for pneumonia and left pleural effusion. She was treated for pneumonia 2 weeks ago at [facility].   CT Chest shows partially loculated left pleural effusion. Findings of acute pancreatitis and multiple fluid enhancing collections in the left upper quadrant. 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  ID consulted, recommended to complete course of doxycycline  and augmentin .   Patient reports cough, sputum production and some wheezing along with dyspnea. Denies hemoptysis. She feels like she has subjective fevers.  Pertinent  Medical History   Past Medical History:  Diagnosis Date   Anxiety    Arthritis    bilateral hips and knees   Asthma    Bipolar disorder (HCC)    Depression    Dyspnea    GERD (gastroesophageal reflux disease)    Headache    Hypertension    Neuromuscular disorder (HCC)    PONV (postoperative nausea and vomiting)    Pre-diabetes     Significant Hospital Events: Including procedures, antibiotic start and stop dates in addition to other pertinent events   5/31 admitted to hospital 6/1 PCCM, IR, ID consulted 6/3 chest tube placed by IR, no pleural fluid sent for studies 6/4 pleural lytic dose 1  Interim History / Subjective:   Afebrile Complains of soreness to chest tube site   Objective    Blood pressure 121/64, pulse (!) 102, temperature 98.1 F (36.7 C), temperature source Oral, resp. rate 17, height 5\' 5"  (1.651 m), weight 118 kg, SpO2 96%.         Intake/Output Summary (Last 24 hours) at 04/22/2024 1345 Last data filed at 04/22/2024 0809 Gross per 24 hour  Intake 10 ml  Output --  Net 10 ml   Filed Weights   04/15/24 1728  Weight: 118 kg    Examination: General: young woman, obese, no acute distress HENT: [place]/AT, moist mucous membranes Lungs: Decreased breath sounds bilateral, minimal drainage Cardiovascular: rrr, no murmurs Abdomen: Soft, obese, nontender Extremities: warm, no edema Neuro: alert, oriented moving all extremities  Pleural fluid analysis shows low protein, high LDH 245, 45% eos  6/6 labs show mild hyponatremia, slight increase in leukocytosis Chest x-ray 6/6 shows trace left pleural effusion with bibasilar atelectasis  Resolved problem list   Assessment and Plan   Sepsis due to pancreatitis Left Partially Loculated Pleural Effusion -- s/p lytics 6/4 , low protein suggest transudate, LDH may be related to pancreatic injury, eosinophils suggest reactive fluid Anemia Thrombocytosis  Plan: - Discontinue chest tube  - Continue antibiotics as outlined by ID - continue anoro inhaler for asthma, avoiding inhaled steroids to due possible infection - use as needed albuterol   PCCM will be available as needed    Best Practice (right click and "Reselect all SmartList Selections" daily)   Per primary  Labs   CBC: Recent Labs  Lab 04/17/24 0703 04/18/24 0548 04/19/24 0518 04/20/24 0637 04/21/24 0618  WBC 19.7* 17.8* 16.3* 15.6* 17.2*  HGB 11.0* 10.9* 12.1 11.8* 11.6*  HCT 34.3* 33.7* 37.8  37.0 36.5  MCV 85.1 84.7 84.6 85.3 85.5  PLT 497* 529* 611* 605* 653*    Basic Metabolic Panel: Recent Labs  Lab 04/16/24 0924 04/17/24 0703 04/18/24 0548 04/19/24 0518 04/20/24 0637 04/21/24 0618  NA  --  134* 136 135 135 134*  K  --  3.5 3.6 3.7 3.8 3.9  CL  --  102 103 101 101 99  CO2  --  22 24 24 24 24   GLUCOSE  --  93 107* 108* 132* 106*  BUN  --  10 11 13 19  21*  CREATININE  --  0.85  0.85 0.88 1.31* 1.17*  CALCIUM  --  8.4* 8.7* 9.0 8.8* 8.8*  MG 1.8 1.6* 1.7 1.7 1.5* 2.4  PHOS 4.3 4.9* 4.9* 4.7* 5.1*  --    GFR: Estimated Creatinine Clearance: 84.6 mL/min (A) (by C-G formula based on SCr of 1.17 mg/dL (H)). Recent Labs  Lab 04/15/24 2020 04/16/24 0631 04/18/24 0548 04/19/24 0518 04/20/24 1610 04/21/24 0618  PROCALCITON 0.75  --   --   --   --   --   WBC  --    < > 17.8* 16.3* 15.6* 17.2*   < > = values in this interval not displayed.    Liver Function Tests: Recent Labs  Lab 04/15/24 1731 04/17/24 0703 04/18/24 0548 04/19/24 0518 04/21/24 0618  AST 23 12* 12* 11* 9*  ALT 26 21 15 15 11   ALKPHOS 83 91 73 62 60  BILITOT 0.5 0.5 0.4 0.4 0.3  PROT 6.7 6.0* 5.9* 6.4* 5.9*  ALBUMIN 2.4* 2.2* 2.1* 2.4* 2.1*   Recent Labs  Lab 04/15/24 1731 04/17/24 0703 04/18/24 0548 04/19/24 0518 04/20/24 0637  LIPASE 146* 135* 122* 159* 193*   No results for input(s): "AMMONIA" in the last 168 hours.  ABG No results found for: "PHART", "PCO2ART", "PO2ART", "HCO3", "TCO2", "ACIDBASEDEF", "O2SAT"   Coagulation Profile: No results for input(s): "INR", "PROTIME" in the last 168 hours.  Cardiac Enzymes: No results for input(s): "CKTOTAL", "CKMB", "CKMBINDEX", "TROPONINI" in the last 168 hours.  HbA1C: Hgb A1c MFr Bld  Date/Time Value Ref Range Status  04/20/2024 06:37 AM 7.3 (H) 4.8 - 5.6 % Final    Comment:    (NOTE) Diagnosis of Diabetes The following HbA1c ranges recommended by the American Diabetes Association (ADA) may be used as an aid in the diagnosis of diabetes mellitus.  Hemoglobin             Suggested A1C NGSP%              Diagnosis  <5.7                   Non Diabetic  5.7-6.4                Pre-Diabetic  >6.4                   Diabetic  <7.0                   Glycemic control for                       adults with diabetes.    05/30/2021 01:55 PM 5.7 (H) 4.8 - 5.6 % Final    Comment:    (NOTE) Pre diabetes:           5.7%-6.4%  Diabetes:              >  6.4%  Glycemic control for   <7.0% adults with diabetes     CBG: Recent Labs  Lab 04/22/24 1221  GLUCAP 80     Mana Morison V. Villa Greaser MD [facility] Office: [phone]   See Amion for personal pager PCCM on call pager [phone] until 7pm. Please call Elink 7p-7a. [phone]

## 2024-04-22 NOTE — Progress Notes (Signed)
 Reached out to IR per MD request for chest tube removal. Oncall IR will come to remove chest tube.

## 2024-04-23 DIAGNOSIS — A419 Sepsis, unspecified organism: Secondary | ICD-10-CM | POA: Diagnosis not present

## 2024-04-23 DIAGNOSIS — J189 Pneumonia, unspecified organism: Secondary | ICD-10-CM | POA: Diagnosis not present

## 2024-04-23 LAB — RENAL FUNCTION PANEL
Albumin: 2.3 g/dL — ABNORMAL LOW (ref 3.5–5.0)
Anion gap: 9 (ref 5–15)
BUN: 12 mg/dL (ref 6–20)
CO2: 24 mmol/L (ref 22–32)
Calcium: 8.8 mg/dL — ABNORMAL LOW (ref 8.9–10.3)
Chloride: 99 mmol/L (ref 98–111)
Creatinine, Ser: 0.92 mg/dL (ref 0.44–1.00)
GFR, Estimated: >60 mL/min (ref >60)
Glucose, Bld: 104 mg/dL — ABNORMAL HIGH (ref 70–99)
Phosphorus: 4.7 mg/dL — ABNORMAL HIGH (ref 2.5–4.6)
Potassium: 4.5 mmol/L (ref 3.5–5.1)
Sodium: 132 mmol/L — ABNORMAL LOW (ref 135–145)

## 2024-04-23 LAB — GLUCOSE, CAPILLARY
Glucose-Capillary: 96 mg/dL (ref 70–99)
Glucose-Capillary: 83 mg/dL (ref 70–99)
Glucose-Capillary: 86 mg/dL (ref 70–99)
Glucose-Capillary: 108 mg/dL — ABNORMAL HIGH (ref 70–99)

## 2024-04-23 LAB — CBC
HCT: 35 % — ABNORMAL LOW (ref 36.0–46.0)
Hemoglobin: 10.9 g/dL — ABNORMAL LOW (ref 12.0–15.0)
MCH: 26.4 pg (ref 26.0–34.0)
MCHC: 31.1 g/dL (ref 30.0–36.0)
MCV: 84.7 fL (ref 80.0–100.0)
Platelets: 612 10*3/uL — ABNORMAL HIGH (ref 150–400)
RBC: 4.13 MIL/uL (ref 3.87–5.11)
RDW: 13.6 % (ref 11.5–15.5)
WBC: 14.9 10*3/uL — ABNORMAL HIGH (ref 4.0–10.5)
nRBC: 0 % (ref 0.0–0.2)

## 2024-04-23 LAB — MAGNESIUM: Magnesium: 1.9 mg/dL (ref 1.7–2.4)

## 2024-04-23 MED ORDER — DOXYCYCLINE HYCLATE 100 MG PO TABS: 100.0000 mg | ORAL_TABLET | Freq: Two times a day (BID) | ORAL | Status: DC

## 2024-04-23 MED ORDER — DOXYCYCLINE HYCLATE 100 MG PO TABS
100.0000 mg | ORAL_TABLET | Freq: Two times a day (BID) | ORAL | Status: DC
  Administered 2024-04-23 – 2024-04-25 (×4): 100 mg via ORAL
  Filled 2024-04-23 (×4): qty 1

## 2024-04-23 NOTE — Plan of Care (Signed)
  Problem: Education: Goal: Knowledge of General Education information will improve Description: Including pain rating scale, medication(s)/side effects and non-pharmacologic comfort measures Outcome: Progressing   Problem: Coping: Goal: Level of anxiety will decrease Outcome: Progressing   Problem: Pain Managment: Goal: General experience of comfort will improve and/or be controlled Outcome: Progressing

## 2024-04-23 NOTE — Plan of Care (Signed)
  Problem: Education: Goal: Knowledge of General Education information will improve Description: Including pain rating scale, medication(s)/side effects and non-pharmacologic comfort measures Outcome: Progressing   Problem: Health Behavior/Discharge Planning: Goal: Ability to manage health-related needs will improve Outcome: Progressing   Problem: Clinical Measurements: Goal: Respiratory complications will improve Outcome: Progressing   Problem: Coping: Goal: Level of anxiety will decrease Outcome: Progressing   Problem: Pain Managment: Goal: General experience of comfort will improve and/or be controlled Outcome: Progressing   Problem: Skin Integrity: Goal: Risk for impaired skin integrity will decrease Outcome: Progressing   Problem: Activity: Goal: Ability to tolerate increased activity will improve Outcome: Progressing   Problem: Education: Goal: Ability to describe self-care measures that may prevent or decrease complications (Diabetes Survival Skills Education) will improve Outcome: Progressing   Problem: Coping: Goal: Ability to adjust to condition or change in health will improve Outcome: Progressing

## 2024-04-23 NOTE — Progress Notes (Signed)
 Katrina Lambert  ZOX:[id number] DOB: 01/10/1986 DOA: 04/15/2024 PCP: Aurelio Blower [facility]    Brief Narrative:  38 year old with a history of anxiety/depression, bipolar disorder, GERD, morbid obesity, prediabetes, cervical myelopathy, and tobacco abuse who presented to the ED 5/31 with cough, dyspnea on exertion, and back pain.  She had recently undergone a 3-day hospitalization at [facility] for left lower lobe pneumonia, having been discharged approximately 2 weeks prior to this presentation.  In the ER a CXR noted a moderate left pleural effusion with bibasilar atelectasis versus infiltrate.  Goals of Care:   Code Status: Full Code   DVT prophylaxis: enoxaparin  (LOVENOX ) injection 40 mg Start: 04/15/24 2100   Interim Hx: No acute events reported overnight.  Afebrile.  Vital signs stable.  Oxygen saturation 96% room air.  No new complaints today.  Some intermittent nagging abdominal pain.  Appetite improving.  No shortness of breath.  No fever.  Ambulating some around her room.  Assessment & Plan:  Healthcare acquired pneumonia with sepsis Patient met sepsis criteria at presentation with tachycardia, tachypnea, leukocytosis, and evidence of a pulmonary infiltrate -has been treated with broad-spectrum IV antibiotics - sepsis physiology has resolved -remains on protracted antibiotic course as suggested by ID (to complete 10 days oral abx)  Left pleural effusion Due to persisting respiratory symptoms CT of the chest was accomplished which noted a loculated left-sided pleural effusion - Pulmonary recommended chest tube insertion which was completed by IR 6/3 - thrombolytics were instilled in her tube by PCCM 6/4 -chest tube successfully removed 6/7 -respiratory status is presently stable  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 Was initially admitted at [facility] 03/18/24 with acute pancreatitis  -inciting etiology unclear - CT abdomen/pelvis this  admission noted persisting evidence of pancreatic inflammation and lipase was elevated at 146 - General Surgery evaluated the patient and did not feel that any acute intervention was indicated - GI recommended ongoing symptomatic treatment with outpatient follow-up once pseudocysts have matured -no acute complications at this time -tolerating diet without significant difficulty presently  HTN Blood pressure presently well-controlled  PCOS Continue usual metformin  dose  Prediabetes > DM2 A1c 7.3 during this admission thereby meeting criteria for formal diagnosis of DM2 - continue metformin  -follow CBGs closely -outpatient follow-up will be indicated as it is possible with improved inflammation of pancreas the pancreatic function will improve and she will not require any additional diabetes medications  Hypomagnesemia Corrected with supplementation  Iron  deficiency Iron  quite low at 17 with low TIBC at 190 likely due to ongoing smoldering infection and acute pancreatitis - avoiding IV iron  in setting of acute infection  B12 deficiency B12 markedly low at 80 - supplementation ongoing -will need outpatient recheck of B12 level in 6-8 weeks  Vitamin D  deficiency 25-hydroxy vitamin D  low at 14 -supplementation initiated  Tobacco abuse Advised that she must discontinue smoking entirely  Class III obesity - Body mass index is 43.29 kg/m.   Family Communication: No family present at time of exam Disposition: Discharge home, likely 6/9   Objective: Blood pressure (!) 127/59, pulse (!) 102, temperature 99.1 F (37.3 C), temperature source Oral, resp. rate 17, height 5\' 5"  (1.651 m), weight 118 kg, SpO2 96%.  Intake/Output Summary (Last 24 hours) at 04/23/2024 1020 Last data filed at 04/23/2024 0812 Gross per 24 hour  Intake 260 ml  Output --  Net 260 ml   Filed Weights   04/15/24 1728  Weight: 118 kg    Examination: General:  No acute respiratory distress at rest in bed Lungs:  Improved air movement throughout all fields with no wheezing Cardiovascular: Regular rate and rhythm without murmur  Abdomen: Obese, soft, bowel sounds positive, no rebound -no appreciable mass Extremities: Trace edema bilateral lower extremities  CBC: Recent Labs  Lab 04/20/24 0637 04/21/24 0618 04/23/24 0510  WBC 15.6* 17.2* 14.9*  HGB 11.8* 11.6* 10.9*  HCT 37.0 36.5 35.0*  MCV 85.3 85.5 84.7  PLT 605* 653* 612*   Basic Metabolic Panel: Recent Labs  Lab 04/19/24 0518 04/20/24 0637 04/21/24 0618 04/23/24 0510  NA 135 135 134* 132*  K 3.7 3.8 3.9 4.5  CL 101 101 99 99  CO2 24 24 24 24   GLUCOSE 108* 132* 106* 104*  BUN 13 19 21* 12  CREATININE 0.88 1.31* 1.17* 0.92  CALCIUM 9.0 8.8* 8.8* 8.8*  MG 1.7 1.5* 2.4 1.9  PHOS 4.7* 5.1*  --  4.7*   GFR: Estimated Creatinine Clearance: 107.6 mL/min (by C-G formula based on SCr of 0.92 mg/dL).   Scheduled Meds:  acetaminophen   650 mg Oral TID   amoxicillin -clavulanate  1 tablet Oral Q12H   ascorbic acid   500 mg Oral Daily   vitamin B-12  1,000 mcg Oral Daily   enoxaparin  (LOVENOX ) injection  40 mg Subcutaneous Q24H   feeding supplement  237 mL Oral TID BM   folic acid   1 mg Oral Daily   insulin aspart  0-5 Units Subcutaneous QHS   insulin aspart  0-9 Units Subcutaneous TID WC   iron  polysaccharides  150 mg Oral Daily   metFORMIN   500 mg Oral BID WC   multivitamin with minerals  1 tablet Oral Daily   mupirocin  ointment   Topical BID   nicotine   21 mg Transdermal Daily   nystatin    Topical BID   potassium chloride   20 mEq Oral Daily   sodium chloride  flush  10 mL Intrapleural Q8H   thiamine   100 mg Oral Daily   umeclidinium-vilanterol  1 puff Inhalation Daily   Vitamin D  (Ergocalciferol )  50,000 Units Oral Q7 days      LOS: 8 days   Abbe Abate, MD [facility] Office  [phone] Pager - Text Page per Amion  If 7PM-7AM, please contact night-coverage per Amion 04/23/2024, 10:20 AM

## 2024-04-24 DIAGNOSIS — K8500 Idiopathic acute pancreatitis without necrosis or infection: Secondary | ICD-10-CM | POA: Diagnosis not present

## 2024-04-24 DIAGNOSIS — J90 Pleural effusion, not elsewhere classified: Secondary | ICD-10-CM | POA: Diagnosis not present

## 2024-04-24 LAB — BASIC METABOLIC PANEL WITH GFR
Anion gap: 14 (ref 5–15)
BUN: 9 mg/dL (ref 6–20)
CO2: 22 mmol/L (ref 22–32)
Calcium: 9.2 mg/dL (ref 8.9–10.3)
Chloride: 99 mmol/L (ref 98–111)
Creatinine, Ser: 0.9 mg/dL (ref 0.44–1.00)
GFR, Estimated: >60 mL/min (ref >60)
Glucose, Bld: 96 mg/dL (ref 70–99)
Potassium: 4.8 mmol/L (ref 3.5–5.1)
Sodium: 135 mmol/L (ref 135–145)

## 2024-04-24 LAB — CBC
HCT: 35.2 % — ABNORMAL LOW (ref 36.0–46.0)
Hemoglobin: 11.1 g/dL — ABNORMAL LOW (ref 12.0–15.0)
MCH: 26.6 pg (ref 26.0–34.0)
MCHC: 31.5 g/dL (ref 30.0–36.0)
MCV: 84.2 fL (ref 80.0–100.0)
Platelets: 686 10*3/uL — ABNORMAL HIGH (ref 150–400)
RBC: 4.18 MIL/uL (ref 3.87–5.11)
RDW: 13.6 % (ref 11.5–15.5)
WBC: 16 10*3/uL — ABNORMAL HIGH (ref 4.0–10.5)
nRBC: 0 % (ref 0.0–0.2)

## 2024-04-24 LAB — GLUCOSE, CAPILLARY
Glucose-Capillary: 97 mg/dL (ref 70–99)
Glucose-Capillary: 94 mg/dL (ref 70–99)
Glucose-Capillary: 123 mg/dL — ABNORMAL HIGH (ref 70–99)
Glucose-Capillary: 132 mg/dL — ABNORMAL HIGH (ref 70–99)

## 2024-04-24 LAB — PHOSPHORUS: Phosphorus: 5.1 mg/dL — ABNORMAL HIGH (ref 2.5–4.6)

## 2024-04-24 LAB — MAGNESIUM: Magnesium: 1.7 mg/dL (ref 1.7–2.4)

## 2024-04-24 LAB — CYTOLOGY - NON PAP

## 2024-04-24 MED ORDER — RISAQUAD PO CAPS
1.0000 | ORAL_CAPSULE | Freq: Every day | ORAL | Status: DC
  Administered 2024-04-24 – 2024-04-26 (×3): 1 via ORAL
  Filled 2024-04-24 (×3): qty 1

## 2024-04-24 NOTE — Evaluation (Signed)
 Physical Therapy Evaluation Patient Details Name: Katrina Lambert MRN: [id number] DOB: 06-Aug-1986 Today's Date: 04/24/2024  History of Present Illness  Pt is a 38 y.o. F, who presented to the ED on 04/15/24 for evaluation of dyspnea on exertion, cough, and back pain. IMG shows small left pleural effusion and bibasilar atelectasis. Pt w/ recent hospitalization for left lower lobe pneumonia and left thigh abscess following spider bite. PMH is significant for anxiety, depression, GERD, HTN, and dyspnea.   Clinical Impression  Prior to admittance pt reports independence with all mobility, mobilizing without an AD. Pt presents to evaluation with decreased mobility, decreased activity tolerance, decreased strength, and pain, all limiting patient's ability to mobilize near baseline. Pt was able to ambulate without an AD and no physical assistance. Pt declined stair training at this time, but would like to trial prior to discharge. Pt would benefit from stair and higher level balance training. PT will continue to treat patient while she is admitted. No follow-up therapies recommended.       If plan is discharge home, recommend the following: Help with stairs or ramp for entrance;Assist for transportation   Can travel by private vehicle        Equipment Recommendations None recommended by PT  Recommendations for Other Services       Functional Status Assessment Patient has had a recent decline in their functional status and demonstrates the ability to make significant improvements in function in a reasonable and predictable amount of time.     Precautions / Restrictions Precautions Precautions: None Restrictions Weight Bearing Restrictions Per Provider Order: No      Mobility  Bed Mobility Overal bed mobility: Needs Assistance Bed Mobility: Supine to Sit, Sit to Supine     Supine to sit: HOB elevated, Independent Sit to supine: HOB elevated, Independent   General bed mobility comments:  increased time to complete    Transfers Overall transfer level: Independent Equipment used: None Transfers: Sit to/from Stand Sit to Stand: Independent           General transfer comment: pushes up from surface with BUE; increased time to complete    Ambulation/Gait Ambulation/Gait assistance: Modified independent (Device/Increase time) Gait Distance (Feet): 150 Feet Assistive device: Rolling walker (2 wheels), None Gait Pattern/deviations: Step-through pattern, Decreased stride length Gait velocity: reduced Gait velocity interpretation: 1.31 - 2.62 ft/sec, indicative of limited community ambulator   General Gait Details: pt demonstrates reciprocal gait pattern, ambulating 52' w/ RW and supervision, progressing to 60' w/ no AD and supervision. No losses of balance noted. Gait speed remained the same without an AD.  Stairs            Wheelchair Mobility     Tilt Bed    Modified Rankin (Stroke Patients Only)       Balance Overall balance assessment: Needs assistance Sitting-balance support: No upper extremity supported, Feet supported Sitting balance-Leahy Scale: Good     Standing balance support: No upper extremity supported, During functional activity Standing balance-Leahy Scale: Good               High level balance activites: Sudden stops High Level Balance Comments: pt demonstrates good postural control with sudden stop; no losses of balance noted.             Pertinent Vitals/Pain Pain Assessment Pain Assessment: 0-10 Pain Score: 7  Pain Location: low thoracic/upper lumbar spine Pain Descriptors / Indicators: Constant, Discomfort, Grimacing Pain Intervention(s): Limited activity within patient's tolerance, Monitored during session,  Patient requesting pain meds-RN notified    Home Living Family/patient expects to be discharged to:: Private residence Living Arrangements: Children Available Help at Discharge: Family;Available 24 hours/day  (daughter) Type of Home: Mobile home Home Access: Stairs to enter Entrance Stairs-Rails: Right;Left;Can reach both Entrance Stairs-Number of Steps: 4   Home Layout: One level Home Equipment: Agricultural consultant (2 wheels)      Prior Function Prior Level of Function : Independent/Modified Independent;Driving;Working/employed             Mobility Comments: pt is independent with all mobility, not utilizing an AD ADLs Comments: pt is independent with all ADls     Extremity/Trunk Assessment   Upper Extremity Assessment Upper Extremity Assessment: Overall WFL for tasks assessed    Lower Extremity Assessment Lower Extremity Assessment: Generalized weakness    Cervical / Trunk Assessment Cervical / Trunk Assessment: Kyphotic  Communication   Communication Communication: No apparent difficulties    Cognition Arousal: Alert Behavior During Therapy: WFL for tasks assessed/performed   PT - Cognitive impairments: No apparent impairments                         Following commands: Intact       Cueing Cueing Techniques: Verbal cues, Visual cues     General Comments General comments (skin integrity, edema, etc.): no signs of acute distress    Exercises     Assessment/Plan    PT Assessment Patient needs continued PT services  PT Problem List Decreased strength;Decreased activity tolerance;Decreased balance;Decreased mobility;Pain       PT Treatment Interventions DME instruction;Gait training;Stair training;Functional mobility training;Therapeutic activities;Therapeutic exercise;Balance training;Neuromuscular re-education    PT Goals (Current goals can be found in the Care Plan section)  Acute Rehab PT Goals Patient Stated Goal: to improve strength and feel better PT Goal Formulation: With patient Time For Goal Achievement: 05/08/24 Potential to Achieve Goals: Good Additional Goals Additional Goal #1: Pt will score >19 on DGI, indicating improved balance  and reduced risk of falls.    Frequency Min 1X/week     Co-evaluation               AM-PAC PT "6 Clicks" Mobility  Outcome Measure Help needed turning from your back to your side while in a flat bed without using bedrails?: None Help needed moving from lying on your back to sitting on the side of a flat bed without using bedrails?: None Help needed moving to and from a bed to a chair (including a wheelchair)?: A Little Help needed standing up from a chair using your arms (e.g., wheelchair or bedside chair)?: A Little Help needed to walk in hospital room?: A Little Help needed climbing 3-5 steps with a railing? : A Little 6 Click Score: 20    End of Session Equipment Utilized During Treatment: Gait belt Activity Tolerance: Patient tolerated treatment well Patient left: in bed;with call bell/phone within reach Nurse Communication: Mobility status;Patient requests pain meds PT Visit Diagnosis: Other abnormalities of gait and mobility (R26.89);Muscle weakness (generalized) (M62.81);Pain Pain - Right/Left:  (central) Pain - part of body:  (low back)    Time: 1610-9604 PT Time Calculation (min) (ACUTE ONLY): 11 min   Charges:   PT Evaluation $PT Eval Low Complexity: 1 Low   PT General Charges $$ ACUTE PT VISIT: 1 Visit         Lonell Rives, SPT Acute Rehab [phone]   Lonell Rives 04/24/2024, 10:56 AM

## 2024-04-24 NOTE — Progress Notes (Signed)
 Katrina Lambert  VWU:[id number] DOB: 1986/10/12 DOA: 04/15/2024 PCP: Aurelio Blower [facility]    Brief Narrative:  38 year old with a history of anxiety/depression, bipolar disorder, GERD, morbid obesity, prediabetes, cervical myelopathy, and tobacco abuse who presented to the ED 5/31 with cough, dyspnea on exertion, and back pain.  She had recently undergone a 3-day hospitalization at [facility] for left lower lobe pneumonia, having been discharged approximately 2 weeks prior to this presentation.  In the ER a CXR noted a moderate left pleural effusion with bibasilar atelectasis versus infiltrate.  Goals of Care:   Code Status: Full Code   DVT prophylaxis: enoxaparin  (LOVENOX ) injection 40 mg Start: 04/15/24 2100   Interim Hx: Vital signs are stable and the patient is afebrile, but she has had difficulty with increasing abdominal pain and significant decreased oral intake today.  She denies chest pain or shortness of breath.  No nausea or vomiting, just poor oral intake.  Assessment & Plan:  Healthcare acquired pneumonia with sepsis Patient met sepsis criteria at presentation with tachycardia, tachypnea, leukocytosis, and evidence of a pulmonary infiltrate - has been treated with broad-spectrum IV antibiotics - sepsis physiology has resolved - remains on protracted antibiotic course as suggested by ID (to complete 10 days oral abx)  Left pleural effusion Due to persisting respiratory symptoms CT of the chest was accomplished which noted a loculated left-sided pleural effusion - Pulmonary recommended chest tube insertion which was completed by IR 6/3 - thrombolytics were instilled in her tube by PCCM 6/4 -chest tube successfully removed 6/7 -respiratory status is presently stable, with sats 90% or > w/o supplemental O2 support   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 Was initially admitted at [facility] 03/18/24 with acute pancreatitis  -inciting  etiology unclear - CT abdomen/pelvis this admission noted persisting evidence of pancreatic inflammation and lipase was elevated at 146 - General Surgery evaluated the patient and did not feel that any acute intervention was indicated - GI recommended ongoing symptomatic treatment with outpatient follow-up once pseudocysts have matured -no acute complications at this time -dietary intolerance today hopefully is a simple transient issue related to her pancreatitis and not indication of developing complication -keep in hospital overnight and monitor symptoms  HTN Blood pressure presently well-controlled  PCOS Continue usual metformin  dose  Prediabetes  A1c 7.3 during this admission thereby meeting criteria for formal diagnosis of DM2, though I would hesitate to commit her to this lifelong diagnosis in the setting of acute pancreatic inflammation- continue metformin  - follow CBGs closely -outpatient follow-up will be indicated as it is possible with improved inflammation of pancreas the pancreatic function will improve and she will not require any additional diabetes medications  Hypomagnesemia Corrected with supplementation  Iron  deficiency Iron  quite low at 17 with low TIBC at 190 likely due to ongoing smoldering infection and acute pancreatitis - avoiding IV iron  in setting of acute infection -continue oral iron  supplementation in outpatient setting  B12 deficiency B12 markedly low at 80 - supplementation ongoing -will need outpatient recheck of B12 level in 6-8 weeks  Vitamin D  deficiency 25-hydroxy vitamin D  low at 14 -supplementation initiated -will need follow-up vitamin D  level in 6-8 weeks  Tobacco abuse Advised that she must discontinue smoking entirely  Class III obesity - Body mass index is 43.29 kg/m.   Family Communication: No family present at time of exam Disposition: Discharge home, likely 6/9   Objective: Blood pressure 123/69, pulse 98, temperature 98.1 F (36.7  C), temperature source  Oral, resp. rate 17, height 5\' 5"  (1.651 m), weight 118 kg, SpO2 95%.  Intake/Output Summary (Last 24 hours) at 04/24/2024 1049 Last data filed at 04/23/2024 1629 Gross per 24 hour  Intake 10 ml  Output --  Net 10 ml   Filed Weights   04/15/24 1728  Weight: 118 kg    Examination: General: No acute respiratory distress at rest in bed Lungs: Improved air movement throughout all fields with no wheezing Cardiovascular: Regular rate and rhythm without murmur  Abdomen: Obese, soft, bowel sounds positive, no rebound  Extremities: Trace edema bilateral lower extremities  CBC: Recent Labs  Lab 04/21/24 0618 04/23/24 0510 04/24/24 0608  WBC 17.2* 14.9* 16.0*  HGB 11.6* 10.9* 11.1*  HCT 36.5 35.0* 35.2*  MCV 85.5 84.7 84.2  PLT 653* 612* 686*   Basic Metabolic Panel: Recent Labs  Lab 04/20/24 0637 04/21/24 0618 04/23/24 0510 04/24/24 0608  NA 135 134* 132* 135  K 3.8 3.9 4.5 4.8  CL 101 99 99 99  CO2 24 24 24 22   GLUCOSE 132* 106* 104* 96  BUN 19 21* 12 9  CREATININE 1.31* 1.17* 0.92 0.90  CALCIUM 8.8* 8.8* 8.8* 9.2  MG 1.5* 2.4 1.9 1.7  PHOS 5.1*  --  4.7* 5.1*   GFR: Estimated Creatinine Clearance: 110 mL/min (by C-G formula based on SCr of 0.9 mg/dL).   Scheduled Meds:  acetaminophen   650 mg Oral TID   amoxicillin -clavulanate  1 tablet Oral Q12H   ascorbic acid   500 mg Oral Daily   vitamin B-12  1,000 mcg Oral Daily   doxycycline   100 mg Oral Q12H   enoxaparin  (LOVENOX ) injection  40 mg Subcutaneous Q24H   feeding supplement  237 mL Oral TID BM   folic acid   1 mg Oral Daily   insulin aspart  0-5 Units Subcutaneous QHS   insulin aspart  0-9 Units Subcutaneous TID WC   iron  polysaccharides  150 mg Oral Daily   metFORMIN   500 mg Oral BID WC   multivitamin with minerals  1 tablet Oral Daily   mupirocin  ointment   Topical BID   nicotine   21 mg Transdermal Daily   nystatin    Topical BID   sodium chloride  flush  10 mL Intrapleural Q8H    thiamine   100 mg Oral Daily   umeclidinium-vilanterol  1 puff Inhalation Daily   Vitamin D  (Ergocalciferol )  50,000 Units Oral Q7 days      LOS: 9 days   Abbe Abate, MD [facility] Office  [phone] Pager - Text Page per Tilford Foley  If 7PM-7AM, please contact night-coverage per Amion 04/24/2024, 10:49 AM

## 2024-04-24 NOTE — Progress Notes (Signed)
 Pt refuses any dressing to left hip.  Slight pink , but dry and scaly.

## 2024-04-25 ENCOUNTER — Inpatient Hospital Stay (HOSPITAL_COMMUNITY): Payer: MEDICAID

## 2024-04-25 DIAGNOSIS — J189 Pneumonia, unspecified organism: Secondary | ICD-10-CM | POA: Diagnosis not present

## 2024-04-25 DIAGNOSIS — A419 Sepsis, unspecified organism: Secondary | ICD-10-CM | POA: Diagnosis not present

## 2024-04-25 LAB — GLUCOSE, CAPILLARY
Glucose-Capillary: 113 mg/dL — ABNORMAL HIGH (ref 70–99)
Glucose-Capillary: 94 mg/dL (ref 70–99)
Glucose-Capillary: 110 mg/dL — ABNORMAL HIGH (ref 70–99)
Glucose-Capillary: 104 mg/dL — ABNORMAL HIGH (ref 70–99)

## 2024-04-25 LAB — COMPREHENSIVE METABOLIC PANEL WITH GFR
ALT: 13 U/L (ref 0–44)
AST: 12 U/L — ABNORMAL LOW (ref 15–41)
Albumin: 2.3 g/dL — ABNORMAL LOW (ref 3.5–5.0)
Alkaline Phosphatase: 58 U/L (ref 38–126)
Anion gap: 11 (ref 5–15)
BUN: 11 mg/dL (ref 6–20)
CO2: 25 mmol/L (ref 22–32)
Calcium: 8.8 mg/dL — ABNORMAL LOW (ref 8.9–10.3)
Chloride: 96 mmol/L — ABNORMAL LOW (ref 98–111)
Creatinine, Ser: 0.85 mg/dL (ref 0.44–1.00)
GFR, Estimated: >60 mL/min (ref >60)
Glucose, Bld: 106 mg/dL — ABNORMAL HIGH (ref 70–99)
Potassium: 4.5 mmol/L (ref 3.5–5.1)
Sodium: 132 mmol/L — ABNORMAL LOW (ref 135–145)
Total Bilirubin: 0.5 mg/dL (ref 0.0–1.2)
Total Protein: 6.5 g/dL (ref 6.5–8.1)

## 2024-04-25 LAB — CBC
HCT: 34 % — ABNORMAL LOW (ref 36.0–46.0)
Hemoglobin: 10.9 g/dL — ABNORMAL LOW (ref 12.0–15.0)
MCH: 26.6 pg (ref 26.0–34.0)
MCHC: 32.1 g/dL (ref 30.0–36.0)
MCV: 82.9 fL (ref 80.0–100.0)
Platelets: 668 10*3/uL — ABNORMAL HIGH (ref 150–400)
RBC: 4.1 MIL/uL (ref 3.87–5.11)
RDW: 13.6 % (ref 11.5–15.5)
WBC: 16.2 10*3/uL — ABNORMAL HIGH (ref 4.0–10.5)
nRBC: 0 % (ref 0.0–0.2)

## 2024-04-25 LAB — PATHOLOGIST SMEAR REVIEW

## 2024-04-25 LAB — MAGNESIUM: Magnesium: 1.8 mg/dL (ref 1.7–2.4)

## 2024-04-25 MED ORDER — DOXYCYCLINE HYCLATE 100 MG PO TABS
100.0000 mg | ORAL_TABLET | Freq: Two times a day (BID) | ORAL | Status: DC
  Administered 2024-04-25 – 2024-04-26 (×2): 100 mg via ORAL
  Filled 2024-04-25 (×2): qty 1

## 2024-04-25 MED ORDER — POLYSACCHARIDE IRON COMPLEX 150 MG PO CAPS
150.0000 mg | ORAL_CAPSULE | Freq: Every day | ORAL | Status: DC
  Filled 2024-04-25: qty 1

## 2024-04-25 MED ORDER — AMOXICILLIN-POT CLAVULANATE 875-125 MG PO TABS
1.0000 | ORAL_TABLET | Freq: Two times a day (BID) | ORAL | Status: DC
  Administered 2024-04-25 – 2024-04-26 (×2): 1 via ORAL
  Filled 2024-04-25 (×2): qty 1

## 2024-04-25 MED ORDER — IOHEXOL 350 MG/ML SOLN
75.0000 mL | Freq: Once | INTRAVENOUS | Status: AC | PRN
  Administered 2024-04-25: 75 mL via INTRAVENOUS

## 2024-04-25 NOTE — Progress Notes (Signed)
 Katrina Lambert  ZOX:[id number] DOB: January 02, 1986 DOA: 04/15/2024 PCP: Aurelio Blower [facility]    Brief Narrative:  38 year old with a history of anxiety/depression, bipolar disorder, GERD, morbid obesity, prediabetes, cervical myelopathy, and tobacco abuse who presented to the ED 5/31 with cough, dyspnea on exertion, and back pain.  She had recently undergone a 3-day hospitalization at [facility] for left lower lobe pneumonia, having been discharged approximately 2 weeks prior to this presentation.  In the ER a CXR noted a moderate left pleural effusion with bibasilar atelectasis versus infiltrate.  Goals of Care:   Code Status: Full Code   DVT prophylaxis: enoxaparin  (LOVENOX ) injection 40 mg Start: 04/15/24 2100   Interim Hx: Afebrile.  Vital signs stable.  Oxygen saturation 93% on room air.  The patient reports ongoing intolerance of oral intake and worsening abdominal pain overnight and into this morning.  This has been an appreciable change in the last 24 hours.  She denies shortness of breath or chest pain.  No vomiting but reports severe nausea.  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 Was initially admitted at [facility] 03/18/24 with acute pancreatitis  - inciting etiology unclear - CT abdomen/pelvis this admission noted persisting evidence of pancreatic inflammation and lipase was elevated at 146 - General Surgery evaluated the patient and did not feel that any acute intervention was indicated - GI recommended ongoing symptomatic treatment with outpatient follow-up once pseudocysts have matured -given her worsening symptoms over the last 24 hours I will repeat CT abdomen today to have another look at her pancreas and assure that a new complication has not arisen -I am also discontinuing as many oral medications as possible so that she is not receiving a bolus of oral medications each morning  Healthcare acquired pneumonia  with sepsis Patient met sepsis criteria at presentation with tachycardia, tachypnea, leukocytosis, and evidence of a pulmonary infiltrate - has been treated with broad-spectrum IV antibiotics - sepsis physiology has resolved - completed a protracted antibiotic course as suggested by ID  Left pleural effusion Due to persisting respiratory symptoms CT of the chest was accomplished which noted a loculated left-sided pleural effusion - Pulmonary recommended chest tube insertion which was completed by IR 6/3 - thrombolytics were instilled in her tube by PCCM 6/4 -chest tube successfully removed 6/7 - respiratory status is presently stable, with sats 90% or > w/o supplemental O2 support   HTN Blood pressure presently well-controlled  PCOS Continue usual metformin  dose  Prediabetes  A1c 7.3 during this admission thereby meeting criteria for formal diagnosis of DM2, though I would hesitate to commit her to this lifelong diagnosis in the setting of acute pancreatic inflammation- continue metformin  - follow CBGs closely -outpatient follow-up will be indicated as it is possible with improved inflammation of pancreas the pancreatic function will improve and she will not require any additional diabetes medications  Hypomagnesemia Corrected with supplementation  Iron  deficiency Iron  quite low at 17 with low TIBC at 190 likely due to ongoing smoldering infection and acute pancreatitis - avoided IV iron  in setting of acute infection -continue oral iron  supplementation in outpatient setting  B12 deficiency B12 markedly low at 80 - supplementation ongoing -will need outpatient recheck of B12 level in 6-8 weeks  Vitamin D  deficiency 25-hydroxy vitamin D  low at 14 -supplementation initiated -will need follow-up vitamin D  level in 6-8 weeks  Tobacco abuse Advised that she must discontinue smoking entirely  Class III obesity - Body mass index is  43.29 kg/m.   Family Communication: No family present at  time of exam Disposition: Ultimate plan is for discharge home but patient not yet ready due to recrudescence of abdominal symptoms   Objective: Blood pressure 114/72, pulse 93, temperature 98 F (36.7 C), temperature source Oral, resp. rate 17, height 5\' 5"  (1.651 m), weight 118 kg, SpO2 93%.  Intake/Output Summary (Last 24 hours) at 04/25/2024 1010 Last data filed at 04/24/2024 1659 Gross per 24 hour  Intake 360 ml  Output --  Net 360 ml   Filed Weights   04/15/24 1728  Weight: 118 kg    Examination: General: No acute respiratory distress at rest in bed Lungs: Improved air movement throughout all fields with no wheezing Cardiovascular: Regular rate and rhythm without murmur  Abdomen: Obese, no rebound, tender to palpation in epigastrium, no mass, soft Extremities: Trace edema bilateral lower extremities  CBC: Recent Labs  Lab 04/23/24 0510 04/24/24 0608 04/25/24 0603  WBC 14.9* 16.0* 16.2*  HGB 10.9* 11.1* 10.9*  HCT 35.0* 35.2* 34.0*  MCV 84.7 84.2 82.9  PLT 612* 686* 668*   Basic Metabolic Panel: Recent Labs  Lab 04/20/24 0637 04/21/24 0618 04/23/24 0510 04/24/24 0608 04/25/24 0603  NA 135   < > 132* 135 132*  K 3.8   < > 4.5 4.8 4.5  CL 101   < > 99 99 96*  CO2 24   < > 24 22 25   GLUCOSE 132*   < > 104* 96 106*  BUN 19   < > 12 9 11   CREATININE 1.31*   < > 0.92 0.90 0.85  CALCIUM 8.8*   < > 8.8* 9.2 8.8*  MG 1.5*   < > 1.9 1.7 1.8  PHOS 5.1*  --  4.7* 5.1*  --    < > = values in this interval not displayed.   GFR: Estimated Creatinine Clearance: 116.4 mL/min (by C-G formula based on SCr of 0.85 mg/dL).   Scheduled Meds:  acetaminophen   650 mg Oral TID   acidophilus  1 capsule Oral Daily   amoxicillin -clavulanate  1 tablet Oral Q12H   ascorbic acid   500 mg Oral Daily   vitamin B-12  1,000 mcg Oral Daily   doxycycline   100 mg Oral Q12H   enoxaparin  (LOVENOX ) injection  40 mg Subcutaneous Q24H   feeding supplement  237 mL Oral TID BM   folic acid    1 mg Oral Daily   insulin aspart  0-5 Units Subcutaneous QHS   insulin aspart  0-9 Units Subcutaneous TID WC   iron  polysaccharides  150 mg Oral Daily   metFORMIN   500 mg Oral BID WC   multivitamin with minerals  1 tablet Oral Daily   mupirocin  ointment   Topical BID   nicotine   21 mg Transdermal Daily   nystatin    Topical BID   thiamine   100 mg Oral Daily   umeclidinium-vilanterol  1 puff Inhalation Daily   Vitamin D  (Ergocalciferol )  50,000 Units Oral Q7 days      LOS: 10 days   Abbe Abate, MD [facility] Office  [phone] Pager - Text Page per Tilford Foley  If 7PM-7AM, please contact night-coverage per Amion 04/25/2024, 10:10 AM

## 2024-04-25 NOTE — Progress Notes (Addendum)
 Nutrition Follow-up  DOCUMENTATION CODES:   Severe malnutrition in context of acute illness/injury, Morbid obesity  INTERVENTION:   Encourage PO intake as allows Continue Heart healthy diet but liberalize from carb modified to promote PO intake *Once intake increases can recommend carb modified diet Ensure Enlive po BID, each supplement provides 350 kcal and 20 grams of protein. Magic cup TID with meals, each supplement provides 290 kcal and 9 grams of protein MVI with minerals daily 100 mg Thiamine  daily Continue Vitamin B12- repletion   Continue 50,000 units Vitamin D  every 7 days x 8 weeks for deficiency followed by 1,000 units Vitamin D  daily for maintenance Recommend updated weight    NUTRITION DIAGNOSIS:   Severe Malnutrition related to acute illness as evidenced by mild muscle depletion, energy intake < or equal to 50% for > or equal to 5 days, percent weight loss (20 lb, 7% weight loss in 1 month.).  - Still applicable   GOAL:   Patient will meet greater than or equal to 90% of their needs  - Ongoing   MONITOR:   PO intake, Diet advancement, Supplement acceptance, Labs, I & O's, Weight trends  REASON FOR ASSESSMENT:   Consult Assessment of nutrition requirement/status, Calorie Count  ASSESSMENT:   38 y.o Female with multiple admission within the last month for acute pancreatitis, abscess of her left thigh, and pneumonia. Presented with ongoing LUQ abdominal pain with nausea and vomiting, SOB, and back pain. Admitted for sepsis secondary to pneumonia and pancreatitis. PMH of anxiety and depression, bipolar disorder, GERD, obesity, prediabetes, tobacco use disorder.   5/31 - CT abd: shows evidence of acute pancreatitis with multiple enhancing fluid collections in the LUQ adjacent to the spleen, stomach and pancreatic tail concerning for abscesses.  6/3 - s/p Chest tube insertion  6/7 - Chest tubes removed   Pt sitting up in bed, endorses abdominal pain. Has been  having increasing abdominal pain over the past couple of days. Intake has been decreasing pt states this is form her abdominal pain and nausea she gets from her morning meds that lasts all day. Reports drinking 1 Ensure and eating 1 Magic cup per day.   Last night had jello, fruit cup, yogurt, and 25% of orange chicken from [employer]. Did not have breakfast this morning due to no appetite. Endorses early satiety and still feels pressure in abdomen. On exam abdomen is soft.   Messaged MD about breaking up morning meds to see if it helps with pt's nausea and intake.   Admit weight: 118 kg Current weight: 118 kg    Average Meal Intake: 6/1: 100% x 1 meal 6/2: 75% x 1 meal 6/3: 100% x 1 meal 6/4: 50% x 1 meal 6/5: 95% x 1 meal 6/6: 5-% x 2 meals  Nutritionally Relevant Medications: Scheduled Meds:  acetaminophen   650 mg Oral TID   acidophilus  1 capsule Oral Daily   amoxicillin -clavulanate  1 tablet Oral Q12H   ascorbic acid   500 mg Oral Daily   vitamin B-12  1,000 mcg Oral Daily   doxycycline   100 mg Oral Q12H   enoxaparin  (LOVENOX ) injection  40 mg Subcutaneous Q24H   feeding supplement  237 mL Oral TID BM   folic acid   1 mg Oral Daily   insulin aspart  0-5 Units Subcutaneous QHS   insulin aspart  0-9 Units Subcutaneous TID WC   iron  polysaccharides  150 mg Oral Daily   metFORMIN   500 mg Oral BID WC  multivitamin with minerals  1 tablet Oral Daily   mupirocin  ointment   Topical BID   nicotine   21 mg Transdermal Daily   nystatin    Topical BID   thiamine   100 mg Oral Daily   umeclidinium-vilanterol  1 puff Inhalation Daily   Vitamin D  (Ergocalciferol )  50,000 Units Oral Q7 days    Labs Reviewed: Sodium 132 Chloride 96 Calcium 8.8 Phosphorus 5.9 Albumin 2.3 AST 12 Iron  17 Folate 4.8 Vitamin D  14.04 Vitamin B-12 80  CBG ranges from 96-106 mg/dL over the last 24 hours HgbA1c 7.3   Diet Order:   Diet Order             Diet heart healthy/carb modified Room  service appropriate? Yes; Fluid consistency: Thin  Diet effective now                   EDUCATION NEEDS:   Education needs have been addressed  Skin:  Skin Assessment: Skin Integrity Issues: Skin Integrity Issues:: Incisions Incisions: Left hip  Last BM:  6/9  Height:   Ht Readings from Last 1 Encounters:  04/15/24 5\' 5"  (1.651 m)    Weight:   Wt Readings from Last 1 Encounters:  04/15/24 118 kg    Ideal Body Weight:  56.8 kg  BMI:  Body mass index is 43.29 kg/m.  Estimated Nutritional Needs:   Kcal:  2100-2300 kcal  Protein:  120-140 gm  Fluid:  >2L/day   Frederik Jansky, RD Registered Dietitian  See Amion for more information

## 2024-04-26 ENCOUNTER — Telehealth: Payer: Self-pay

## 2024-04-26 ENCOUNTER — Other Ambulatory Visit: Payer: Self-pay | Admitting: Internal Medicine

## 2024-04-26 DIAGNOSIS — K859 Acute pancreatitis without necrosis or infection, unspecified: Secondary | ICD-10-CM

## 2024-04-26 DIAGNOSIS — K863 Pseudocyst of pancreas: Secondary | ICD-10-CM

## 2024-04-26 LAB — CBC
HCT: 35 % — ABNORMAL LOW (ref 36.0–46.0)
Hemoglobin: 10.8 g/dL — ABNORMAL LOW (ref 12.0–15.0)
MCH: 26.1 pg (ref 26.0–34.0)
MCHC: 30.9 g/dL (ref 30.0–36.0)
MCV: 84.5 fL (ref 80.0–100.0)
Platelets: 750 10*3/uL — ABNORMAL HIGH (ref 150–400)
RBC: 4.14 MIL/uL (ref 3.87–5.11)
RDW: 13.7 % (ref 11.5–15.5)
WBC: 13.8 10*3/uL — ABNORMAL HIGH (ref 4.0–10.5)
nRBC: 0 % (ref 0.0–0.2)

## 2024-04-26 LAB — COMPREHENSIVE METABOLIC PANEL WITH GFR
ALT: 13 U/L (ref 0–44)
AST: 12 U/L — ABNORMAL LOW (ref 15–41)
Albumin: 2.4 g/dL — ABNORMAL LOW (ref 3.5–5.0)
Alkaline Phosphatase: 80 U/L (ref 38–126)
Anion gap: 11 (ref 5–15)
BUN: 9 mg/dL (ref 6–20)
CO2: 25 mmol/L (ref 22–32)
Calcium: 8.9 mg/dL (ref 8.9–10.3)
Chloride: 99 mmol/L (ref 98–111)
Creatinine, Ser: 0.91 mg/dL (ref 0.44–1.00)
GFR, Estimated: >60 mL/min (ref >60)
Glucose, Bld: 98 mg/dL (ref 70–99)
Potassium: 4.4 mmol/L (ref 3.5–5.1)
Sodium: 135 mmol/L (ref 135–145)
Total Bilirubin: 0.4 mg/dL (ref 0.0–1.2)
Total Protein: 7 g/dL (ref 6.5–8.1)

## 2024-04-26 LAB — GLUCOSE, CAPILLARY
Glucose-Capillary: 123 mg/dL — ABNORMAL HIGH (ref 70–99)
Glucose-Capillary: 102 mg/dL — ABNORMAL HIGH (ref 70–99)

## 2024-04-26 LAB — LIPASE, BLOOD: Lipase: 113 U/L — ABNORMAL HIGH (ref 11–51)

## 2024-04-26 MED ORDER — POLYSACCHARIDE IRON COMPLEX 150 MG PO CAPS: 150.0000 mg | ORAL_CAPSULE | Freq: Every day | ORAL | 2 refills | Status: DC

## 2024-04-26 MED ORDER — METFORMIN HCL 500 MG PO TABS: 500.0000 mg | ORAL_TABLET | Freq: Two times a day (BID) | ORAL | 2 refills | Status: DC

## 2024-04-26 MED ORDER — OXYCODONE HCL 5 MG PO TABS: 5.0000 mg | ORAL_TABLET | ORAL | 0 refills | Status: DC | PRN

## 2024-04-26 MED ORDER — CYANOCOBALAMIN 1000 MCG PO TABS: 1000.0000 ug | ORAL_TABLET | Freq: Every day | ORAL | 2 refills | Status: DC

## 2024-04-26 MED ORDER — VITAMIN D (ERGOCALCIFEROL) 1.25 MG (50000 UNIT) PO CAPS: 50000.0000 [IU] | ORAL_CAPSULE | ORAL | 0 refills | Status: DC

## 2024-04-26 NOTE — Progress Notes (Signed)
 Patient called stating that the scripts were sent to wrong pharmacy. I sent the scripts again to [facility].

## 2024-04-26 NOTE — Plan of Care (Signed)
  Problem: Education: Goal: Knowledge of General Education information will improve Description: Including pain rating scale, medication(s)/side effects and non-pharmacologic comfort measures Outcome: Adequate for Discharge   Problem: Health Behavior/Discharge Planning: Goal: Ability to manage health-related needs will improve Outcome: Adequate for Discharge   Problem: Clinical Measurements: Goal: Ability to maintain clinical measurements within normal limits will improve Outcome: Adequate for Discharge Goal: Will remain free from infection Outcome: Adequate for Discharge Goal: Diagnostic test results will improve Outcome: Adequate for Discharge Goal: Respiratory complications will improve Outcome: Adequate for Discharge Goal: Cardiovascular complication will be avoided Outcome: Adequate for Discharge   Problem: Activity: Goal: Risk for activity intolerance will decrease Outcome: Adequate for Discharge   Problem: Nutrition: Goal: Adequate nutrition will be maintained Outcome: Adequate for Discharge   Problem: Coping: Goal: Level of anxiety will decrease Outcome: Adequate for Discharge   Problem: Elimination: Goal: Will not experience complications related to bowel motility Outcome: Adequate for Discharge Goal: Will not experience complications related to urinary retention Outcome: Adequate for Discharge   Problem: Pain Managment: Goal: General experience of comfort will improve and/or be controlled Outcome: Adequate for Discharge   Problem: Safety: Goal: Ability to remain free from injury will improve Outcome: Adequate for Discharge   Problem: Skin Integrity: Goal: Risk for impaired skin integrity will decrease Outcome: Adequate for Discharge   Problem: Activity: Goal: Ability to tolerate increased activity will improve Outcome: Adequate for Discharge   Problem: Clinical Measurements: Goal: Ability to maintain a body temperature in the normal range will  improve Outcome: Adequate for Discharge   Problem: Respiratory: Goal: Ability to maintain adequate ventilation will improve Outcome: Adequate for Discharge Goal: Ability to maintain a clear airway will improve Outcome: Adequate for Discharge   Problem: Education: Goal: Ability to describe self-care measures that may prevent or decrease complications (Diabetes Survival Skills Education) will improve Outcome: Adequate for Discharge Goal: Individualized Educational Video(s) Outcome: Adequate for Discharge   Problem: Coping: Goal: Ability to adjust to condition or change in health will improve Outcome: Adequate for Discharge   Problem: Fluid Volume: Goal: Ability to maintain a balanced intake and output will improve Outcome: Adequate for Discharge   Problem: Health Behavior/Discharge Planning: Goal: Ability to identify and utilize available resources and services will improve Outcome: Adequate for Discharge Goal: Ability to manage health-related needs will improve Outcome: Adequate for Discharge   Problem: Metabolic: Goal: Ability to maintain appropriate glucose levels will improve Outcome: Adequate for Discharge   Problem: Nutritional: Goal: Maintenance of adequate nutrition will improve Outcome: Adequate for Discharge Goal: Progress toward achieving an optimal weight will improve Outcome: Adequate for Discharge   Problem: Skin Integrity: Goal: Risk for impaired skin integrity will decrease Outcome: Adequate for Discharge   Problem: Tissue Perfusion: Goal: Adequacy of tissue perfusion will improve Outcome: Adequate for Discharge

## 2024-04-26 NOTE — Progress Notes (Signed)
 Physical Therapy Treatment Patient Details Name: Katrina Lambert MRN: [id number] DOB: September 22, 1986 Today's Date: 04/26/2024   History of Present Illness Pt is a 38 y.o. F, who presented to the ED on 04/15/24 for evaluation of dyspnea on exertion, cough, and back pain. IMG shows small left pleural effusion and bibasilar atelectasis. Pt w/ recent hospitalization for left lower lobe pneumonia and left thigh abscess following spider bite. PMH is significant for anxiety, depression, GERD, HTN, and dyspnea.    PT Comments  Pt tolerated mobility progressions well, negotiating stairs utilizing R rail for stability and no physical assistance given. No further PT services are indicated due to pt mobilizing independently with no signs of instability. No follow-up therapies recommended. PT signing off.    If plan is discharge home, recommend the following:     Can travel by private vehicle        Equipment Recommendations  None recommended by PT    Recommendations for Other Services       Precautions / Restrictions Precautions Precautions: None Restrictions Weight Bearing Restrictions Per Provider Order: No     Mobility  Bed Mobility Overal bed mobility: Modified Independent Bed Mobility: Supine to Sit, Sit to Supine     Supine to sit: HOB elevated, Modified independent (Device/Increase time) Sit to supine: HOB elevated, Modified independent (Device/Increase time)        Transfers Overall transfer level: Independent Equipment used: None Transfers: Sit to/from Stand Sit to Stand: Independent                Ambulation/Gait Ambulation/Gait assistance: Independent Gait Distance (Feet): 550 Feet Assistive device: None Gait Pattern/deviations: WFL(Within Functional Limits) Gait velocity: normal Gait velocity interpretation: 1.31 - 2.62 ft/sec, indicative of limited community ambulator       Stairs Stairs: Yes Stairs assistance: Modified independent (Device/Increase  time) Stair Management: One rail Right, Forwards Number of Stairs: 5 General stair comments: Pt uses R rail for stability, demonstrating reciprocal pattern.   Wheelchair Mobility     Tilt Bed    Modified Rankin (Stroke Patients Only)       Balance Overall balance assessment: Independent Sitting-balance support: No upper extremity supported, Feet supported Sitting balance-Leahy Scale: Normal     Standing balance support: No upper extremity supported, During functional activity Standing balance-Leahy Scale: Good                              Communication Communication Communication: No apparent difficulties  Cognition Arousal: Alert Behavior During Therapy: WFL for tasks assessed/performed   PT - Cognitive impairments: No apparent impairments                         Following commands: Intact      Cueing Cueing Techniques: Verbal cues  Exercises      General Comments General comments (skin integrity, edema, etc.): no signs of acute distress      Pertinent Vitals/Pain Pain Assessment Pain Assessment: No/denies pain Pain Score: 0-No pain    Home Living                          Prior Function            PT Goals (current goals can now be found in the care plan section) Acute Rehab PT Goals Patient Stated Goal: to improve strength and feel better PT Goal Formulation: With patient  Time For Goal Achievement: 05/08/24 Potential to Achieve Goals: Good Progress towards PT goals: Goals met/education completed, patient discharged from PT    Frequency    Other (Comment) (discharging patient 04/26/24)      PT Plan      Co-evaluation              AM-PAC PT 6 Clicks Mobility   Outcome Measure  Help needed turning from your back to your side while in a flat bed without using bedrails?: None Help needed moving from lying on your back to sitting on the side of a flat bed without using bedrails?: None Help needed  moving to and from a bed to a chair (including a wheelchair)?: None Help needed standing up from a chair using your arms (e.g., wheelchair or bedside chair)?: None Help needed to walk in hospital room?: None Help needed climbing 3-5 steps with a railing? : None 6 Click Score: 24    End of Session Equipment Utilized During Treatment: Gait belt Activity Tolerance: Patient tolerated treatment well Patient left: in bed;with call bell/phone within reach;with family/visitor present Nurse Communication: Mobility status PT Visit Diagnosis: Other abnormalities of gait and mobility (R26.89);Muscle weakness (generalized) (M62.81)     Time: 1610-9604 PT Time Calculation (min) (ACUTE ONLY): 9 min  Charges:    $Gait Training: 8-22 mins PT General Charges $$ ACUTE PT VISIT: 1 Visit                     Lonell Rives, SPT Acute Rehab [phone]    Lonell Rives 04/26/2024, 12:14 PM

## 2024-04-26 NOTE — Progress Notes (Signed)
 1755: Pt called unit saying oxycodone  was sent to wrong pharmacy. Hoyt Macleod, MD notified to send to [facility] phone [phone], fax [phone].

## 2024-04-26 NOTE — Telephone Encounter (Signed)
 MRI MRCP has been entered and sent to the schedulers

## 2024-04-26 NOTE — Discharge Summary (Signed)
 Physician Discharge Summary  Katrina Lambert UJW:[id number] DOB: 1986-09-03 DOA: 04/15/2024  PCP: Aurelio Blower [facility]  Admit date: 04/15/2024 Discharge date: 04/26/2024  Admitted From: home Discharge disposition: home  Recommendations at discharge:  Continue vitamin B12, vit D and iron  supplement. Follow up with GI as an outpatient. Plan is to obtain MRCP in 2 weeks for possible cyst gastrostomy after MRCP.    Brief narrative: Katrina Lambert is a 38 y.o. female with PMH significant for morbid obesity, prediabetes, HTN, anxiety/depression/bipolar disorder, cervical myelopathy, tobacco abuse 5/31, patient presented to the ED with complaint of cough, dyspnea on exertion, back pain. In the ED, CXR noted a moderate left pleural effusion with bibasilar atelectasis versus infiltrate. Admitted to [facility].  Of note, 2 weeks prior to presentation, patient was hospitalized for 3 days at [facility] for acute pancreatitis that was conservatively managed she also had left lower lobe pneumonia, completed course of antibiotics.  Subjective: Patient was seen and examined this morning.  pleasant young female.  Sitting up in bed.  Not in distress.  Abdominal pain much better. In the last 24 hours, no fever, hemodynamically stable. Labs this morning with WC count 13.8, hemoglobin 10.8  Assessment and plan: Acute pancreatitis with multiple pseudocysts Was initially admitted at [facility] 03/18/24 with acute pancreatitis  - inciting etiology unclear - CT abdomen/pelvis this admission noted persisting evidence of pancreatic inflammation and lipase was elevated at 146 - General Surgery evaluated the patient and did not feel that any acute intervention was indicated - GI recommended ongoing symptomatic treatment with outpatient follow-up once pseudocysts have matured 6/10, patient felt worsening symptoms and hence CT abdomen pelvis was repeated which showed acute pancreatitis with main  pancreatic duct dilatation and interval increase in the large lobulated complex left upper quadrant cystic lesion arising from the pancreatic duct, findings suggestive of an infected pseudocyst or abscess extending along the greater curvature of the stomach. On my evaluation this morning, patient feels much better.  Improving abdominal pain.  On as needed oxycodone .  Feels better and actually wants to go home today. I discussed this with GI Dr. Leonia Raman.  She reviewed CT images along with Dr. Venice Gillis, loculations and septations appear to be outside the pseudocyst. She plans to obtain MRCP in 2 weeks and plan for outpatient GI follow-up for possible cyst gastrostomy after MRCP.    Healthcare acquired pneumonia with sepsis Patient met sepsis criteria at presentation with tachycardia, tachypnea, leukocytosis, and evidence of a pulmonary infiltrate Completed a 10 day course of antibiotics  Left pleural effusion Due to persisting respiratory symptoms CT of the chest was obtained on 5/31 which showed a loculated left-sided pleural effusion.  Pulmonary recommended chest tube insertion which was completed by IR 6/3 - thrombolytics were instilled in her tube by PCCM 6/4 -chest tube successfully removed 6/7 - respiratory status is presently stable, with sats 90% or > w/o supplemental O2 support   HTN Blood pressure currently well-controlled without meds   PCOS Continue usual metformin  dose   Type 2 diabetes mellitus A1c 7.3 on 04/20/2024 PTA meds-metformin .  Currently continued on the same. Blood sugar level in target range.   Hypomagnesemia Corrected with supplementation Recent Labs  Lab 04/20/24 0637 04/21/24 0618 04/23/24 0510 04/24/24 0608 04/25/24 0603 04/26/24 0628  K 3.8 3.9 4.5 4.8 4.5 4.4  MG 1.5* 2.4 1.9 1.7 1.8  --   PHOS 5.1*  --  4.7* 5.1*  --   --    Chronic  anemia  iron  deficiency, vitamin B12 deficiency  Oral iron  supplementation started B12 markedly low at 80 -  supplementation ongoing -will need outpatient recheck of B12 level in 6-8 weeks Recent Labs    04/16/24 0631 04/16/24 0923 04/17/24 0703 04/21/24 0618 04/23/24 0510 04/24/24 0608 04/25/24 0603 04/26/24 0628  HGB 10.9*  --    < > 11.6* 10.9* 11.1* 10.9* 10.8*  MCV 84.7  --    < > 85.5 84.7 84.2 82.9 84.5  VITAMINB12 80*  --   --   --   --   --   --   --   FOLATE  --  4.8*  --   --   --   --   --   --   TIBC 190*  --   --   --   --   --   --   --   IRON  17*  --   --   --   --   --   --   --    < > = values in this interval not displayed.   Vitamin D  deficiency 25-hydroxy vitamin D  low at 14 -supplementation initiated -will need follow-up vitamin D  level in 6-8 weeks   Tobacco abuse Advised that she must discontinue smoking entirely   Morbid Obesity  Body mass index is 43.29 kg/m. Patient has been advised to make an attempt to improve diet and exercise patterns to aid in weight loss.  Goals of care   Code Status: Full Code   Diet:  Diet Order             Diet general           Diet Heart Room service appropriate? Yes; Fluid consistency: Thin  Diet effective now                   Nutritional status:  Body mass index is 43.29 kg/m.  Nutrition Problem: Severe Malnutrition Etiology: acute illness Signs/Symptoms: mild muscle depletion, energy intake < or equal to 50% for > or equal to 5 days, percent weight loss (20 lb, 7% weight loss in 1 month.)  Wounds:  - Pressure Injury 06/06/21 Buttocks Medial Stage 1 -  Intact skin with non-blanchable redness of a localized area usually over a bony prominence. Red (Active)  Date First Assessed/Time First Assessed: 06/06/21 2030   Location: Buttocks  Location Orientation: Medial  Staging: Stage 1 -  Intact skin with non-blanchable redness of a localized area usually over a bony prominence.  Wound Description (Comments): R...    Assessments 06/06/2021  8:30 PM 06/08/2021  8:00 PM  Dressing Type Foam - Lift dressing to assess  site every shift Foam - Lift dressing to assess site every shift  Dressing Clean;Dry;Intact Clean;Dry  Dressing Change Frequency PRN PRN  Site / Wound Assessment -- Pink  Drainage Amount -- None  Treatment Other (Comment) --     No associated orders.     Wound / Incision (Open or Dehisced) 04/15/24 Incision - Open Hip Anterior;Left L hip-Pink, healing, size of thumbprint (Active)  Date First Assessed/Time First Assessed: 04/15/24 2300   Wound Type: Incision - Open  Location: Hip  Location Orientation: Anterior;Left  Wound Description (Comments): L hip-Pink, healing, size of thumbprint  Present on Admission: Yes    Assessments 04/15/2024 11:00 PM 04/26/2024  9:52 AM  Dressing Type Gauze (Comment) --  Dressing Changed Changed --  Dressing Status Clean, Dry, Intact --  Dressing Change Frequency  Daily --  Site / Wound Assessment Clean;Dry;Pink --  Wound Length (cm) -- 0 cm  Wound Width (cm) -- 0 cm  Wound Depth (cm) -- 0 cm  Wound Volume (cm^3) -- 0 cm^3  Wound Surface Area (cm^2) -- 0 cm^2  Drainage Amount None --  Treatment Cleansed --     No associated orders.    Discharge Exam:   Vitals:   04/25/24 1608 04/25/24 2233 04/26/24 0453 04/26/24 0833  BP: 118/72 132/71 119/82 105/65  Pulse: (!) 107 94 84 84  Resp: 18 18 18 17   Temp: 98.6 F (37 C) 97.8 F (36.6 C) (!) 97.4 F (36.3 C) 97.7 F (36.5 C)  TempSrc: Oral Oral Oral Oral  SpO2:  95% 95% 94%  Weight:      Height:        Body mass index is 43.29 kg/m.  General exam: Pleasant, morbidly obese young Caucasian female Skin: No rashes, lesions or ulcers. HEENT: Atraumatic, normocephalic, no obvious bleeding Lungs: Clear to auscultation bilaterally CVS: S1, S2, no murmur GI/Abd: Soft, tenderness present in epigastrium and left upper quadrant, distended from obesity, bowel sound present CNS: Alert, awake, oriented x 3 Psychiatry: Mood appropriate Extremities: No pedal edema, no calf tenderness  Follow ups:     Follow-up Information     [facility] Follow [facility].   Contact information: [address] [place] [address] [phone]                 Discharge Instructions:   Discharge Instructions     Call MD for:  difficulty breathing, headache or visual disturbances   Complete by: As directed    Call MD for:  extreme fatigue   Complete by: As directed    Call MD for:  hives   Complete by: As directed    Call MD for:  persistant dizziness or light-headedness   Complete by: As directed    Call MD for:  persistant nausea and vomiting   Complete by: As directed    Call MD for:  severe uncontrolled pain   Complete by: As directed    Call MD for:  temperature >100.4   Complete by: As directed    Diet general   Complete by: As directed    Discharge instructions   Complete by: As directed    Recommendations at discharge:   Continue vitamin B12, vit D and iron  supplement.  Follow up with GI as an outpatient. Plan is to obtain MRCP in 2 weeks for possible cyst gastrostomy after MRCP.   PDMP reviewed this encounter.   Opioid taper instructions: It is important to wean off of your opioid medication as soon as possible. If you do not need pain medication after your surgery it is ok to stop day one. Opioids include: Codeine, Hydrocodone (Norco, Vicodin), Oxycodone (Percocet, oxycontin ) and hydromorphone  amongst others.  Long term and even short term use of opiods can cause: Increased pain response Dependence Constipation Depression Respiratory depression And more.  Withdrawal symptoms can include Flu like symptoms Nausea, vomiting And more Techniques to manage these symptoms Hydrate well Eat regular healthy meals Stay active Use relaxation techniques(deep breathing, meditating, yoga) Do Not substitute Alcohol to help with tapering If you have been on opioids for less than two weeks and do not have pain than it is ok to stop all together.  Plan to  wean off of opioids This plan should start within one week post op of your joint replacement. Maintain  the same interval or time between taking each dose and first decrease the dose.  Cut the total daily intake of opioids by one tablet each day Next start to increase the time between doses. The last dose that should be eliminated is the evening dose.        General discharge instructions: Follow with Primary MD [facility] in 7 days  Please request your PCP  to go over your hospital tests, procedures, radiology results at the follow up. Please get your medicines reviewed and adjusted.  Your PCP may decide to repeat certain labs or tests as needed. Do not drive, operate heavy machinery, perform activities at heights, swimming or participation in water  activities or provide baby sitting services if your were admitted for syncope or siezures until you have seen by Primary MD or a Neurologist and advised to do so again. [place]  Controlled Substance Reporting System database was reviewed. Do not drive, operate heavy machinery, perform activities at heights, swim, participate in water  activities or provide baby-sitting services while on medications for pain, sleep and mood until your outpatient physician has reevaluated you and advised to do so again.  You are strongly recommended to comply with the dose, frequency and duration of prescribed medications. Activity: As tolerated with Full fall precautions use walker/cane & assistance as needed Avoid using any recreational substances like cigarette, tobacco, alcohol, or non-prescribed drug. If you experience worsening of your admission symptoms, develop shortness of breath, life threatening emergency, suicidal or homicidal thoughts you must seek medical attention immediately by calling 911 or calling your MD immediately  if symptoms less severe. You must read complete instructions/literature along with all the possible  adverse reactions/side effects for all the medicines you take and that have been prescribed to you. Take any new medicine only after you have completely understood and accepted all the possible adverse reactions/side effects.  Wear Seat belts while driving. You were cared for by a hospitalist during your hospital stay. If you have any questions about your discharge medications or the care you received while you were in the hospital after you are discharged, you can call the unit and ask to speak with the hospitalist or the covering physician. Once you are discharged, your primary care physician will handle any further medical issues. Please note that NO REFILLS for any discharge medications will be authorized once you are discharged, as it is imperative that you return to your primary care physician (or establish a relationship with a primary care physician if you do not have one).   Discharge wound care:   Complete by: As directed    Increase activity slowly   Complete by: As directed        Discharge Medications:   Allergies as of 04/26/2024   No Known Allergies      Medication List     STOP taking these medications    amLODipine  10 MG tablet Commonly known as: NORVASC    hydrochlorothiazide  12.5 MG capsule Commonly known as: MICROZIDE    lisinopril  20 MG tablet Commonly known as: ZESTRIL    potassium chloride  10 MEQ tablet Commonly known as: KLOR-CON  M       TAKE these medications    cyanocobalamin  1000 MCG tablet Take 1 tablet (1,000 mcg total) by mouth daily. Start taking on: April 27, 2024   iron  polysaccharides 150 MG capsule Commonly known as: NIFEREX Take 1 capsule (150 mg total) by mouth at bedtime.   metFORMIN  500 MG tablet Commonly known as:  GLUCOPHAGE  Take 1 tablet (500 mg total) by mouth 2 (two) times daily with a meal.   oxyCODONE  5 MG immediate release tablet Commonly known as: Oxy IR/ROXICODONE  Take 1 tablet (5 mg total) by mouth every 4 (four) hours  as needed for severe pain (pain score 7-10).   Vitamin D  (Ergocalciferol ) 1.25 MG (50000 UNIT) Caps capsule Commonly known as: DRISDOL  Take 1 capsule (50,000 Units total) by mouth every 7 (seven) days. Start taking on: April 30, 2024               Discharge Care Instructions  (From admission, onward)           Start     Ordered   04/26/24 0000  Discharge wound care:        04/26/24 1510             The results of significant diagnostics from this hospitalization (including imaging, microbiology, ancillary and laboratory) are listed below for reference.    Procedures and Diagnostic Studies:   CT CHEST ABDOMEN PELVIS W CONTRAST Result Date: 04/15/2024 CLINICAL DATA:  Left flank pain and hematuria with sepsis and shortness of breath. EXAM: CT CHEST, ABDOMEN, AND PELVIS WITH CONTRAST TECHNIQUE: Multidetector CT imaging of the chest, abdomen and pelvis was performed following the standard protocol during bolus administration of intravenous contrast. RADIATION DOSE REDUCTION: This exam was performed according to the departmental dose-optimization program which includes automated exposure control, adjustment of the mA and/or kV according to patient size and/or use of iterative reconstruction technique. CONTRAST:  75mL OMNIPAQUE  IOHEXOL  350 MG/ML SOLN COMPARISON:  CT abdomen and pelvis 12/07/2023. Report only CT angiogram chest 07/01/2023. FINDINGS: CT CHEST FINDINGS Cardiovascular: No significant vascular findings. Normal heart size. No pericardial effusion. Mediastinum/Nodes: No enlarged mediastinal, hilar, or axillary lymph nodes. Thyroid gland, trachea, and esophagus demonstrate no significant findings. Lungs/Pleura: There is a small left pleural effusion which is partially loculated in the upper hemithorax. There is compressive atelectasis of the left lower lobe. The right lung is clear. There is no pneumothorax. Musculoskeletal: Cervical spinal fusion hardware is present. No acute  bony abnormality. CT ABDOMEN PELVIS FINDINGS Hepatobiliary: The liver is enlarged. No focal liver lesion identified. Gallbladder surgically absent. No biliary ductal dilatation. Pancreas: There is inflammatory stranding surrounding the pancreatic tail. There is a focal hypodensity in the pancreatic tail measuring 1.5 by 0.5 cm which is new from prior. The remaining pancreas appears within normal limits. Spleen: Spleen is normal in size and enhances normally. Low-density peripherally enhancing subcapsular fluid collection seen adjacent to the spleen measuring 3.1 x 8.5 by 4.5 cm. No splenic laceration. Adrenals/Urinary Tract: Adrenal glands are unremarkable. Kidneys are normal, without renal calculi, focal lesion, or hydronephrosis. Bladder is unremarkable. Stomach/Bowel: There is enhancing fluid collection adjacent to the fundus of the stomach and adjacent liver measuring 2.5 x 2.0 x 4.7 cm. There is a second enhancing fluid collection adjacent to the greater curvature of the stomach with adjacent stomach wall thickening measuring 3.2 x 3.0 x 3.7 cm. The stomach is nondilated. No other dilated bowel loops are visualized. The appendix is within normal limits. Vascular/Lymphatic: Aortic atherosclerosis. No enlarged abdominal or pelvic lymph nodes. Reproductive: There is an IUD in the uterus. Adnexa are within normal limits. Other: There is no ascites or focal abdominal wall hernia. There is stranding and a small amount of fluid in the left upper quadrant. Enhancing fluid collection in the left upper quadrant measures 3.4 x 2.0 by 8.7 cm image 2/59.  There is no free intraperitoneal air or focal abdominal wall hernia. 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 There is grade 1 anterolisthesis and severe degenerative changes at L3-L4. IMPRESSION: 1. Findings compatible with acute pancreatitis involving the pancreatic tail. 2. There are multiple enhancing fluid collections in the left upper  quadrant adjacent to the spleen, stomach, and pancreatic tail worrisome for abscesses. The largest collection is in the left upper quadrant measuring 8.7 cm. 3. Small left pleural effusion which is partially loculated in the upper hemithorax. 4. Left lower lobe atelectasis. 5. Hepatomegaly. 6. Aortic atherosclerosis. Aortic Atherosclerosis (ICD10-I70.0). Electronically Signed   By: Tyron Gallon M.D.   On: 04/15/2024 23:16   DG Chest 2 View Result Date: 04/15/2024 CLINICAL DATA:  Cough. Left back pain. Shortness of breath. Pneumonia diagnosis 1 month ago. EXAM: CHEST - 2 VIEW COMPARISON:  07/05/2023 FINDINGS: Shallow inspiration. Heart size and pulmonary vascularity are normal for technique. There is a moderate left pleural effusion with basilar atelectasis or infiltration, possibly indicating pneumonia. Right lung is clear. No pneumothorax. Mediastinal contours appear intact. Postoperative changes in the cervical spine. Inferior pedicular screws appear fractured, unchanged since prior study. Surgical clips in the right upper quadrant. IMPRESSION: Moderate left pleural effusion with basilar atelectasis or infiltration, possibly pneumonia. Electronically Signed   By: Boyce Byes M.D.   On: 04/15/2024 19:15     Labs:   Basic Metabolic Panel: Recent Labs  Lab 04/20/24 4098 04/21/24 0618 04/23/24 0510 04/24/24 0608 04/25/24 0603 04/26/24 0628  NA 135 134* 132* 135 132* 135  K 3.8 3.9 4.5 4.8 4.5 4.4  CL 101 99 99 99 96* 99  CO2 24 24 24 22 25 25   GLUCOSE 132* 106* 104* 96 106* 98  BUN 19 21* 12 9 11 9   CREATININE 1.31* 1.17* 0.92 0.90 0.85 0.91  CALCIUM 8.8* 8.8* 8.8* 9.2 8.8* 8.9  MG 1.5* 2.4 1.9 1.7 1.8  --   PHOS 5.1*  --  4.7* 5.1*  --   --    GFR Estimated Creatinine Clearance: 108.8 mL/min (by C-G formula based on SCr of 0.91 mg/dL). Liver Function Tests: Recent Labs  Lab 04/21/24 0618 04/23/24 0510 04/25/24 0603 04/26/24 0628  AST 9*  --  12* 12*  ALT 11  --  13 13   ALKPHOS 60  --  58 80  BILITOT 0.3  --  0.5 0.4  PROT 5.9*  --  6.5 7.0  ALBUMIN 2.1* 2.3* 2.3* 2.4*   Recent Labs  Lab 04/20/24 0637 04/26/24 0628  LIPASE 193* 113*   No results for input(s): AMMONIA in the last 168 hours. Coagulation profile No results for input(s): INR, PROTIME in the last 168 hours.  CBC: Recent Labs  Lab 04/21/24 0618 04/23/24 0510 04/24/24 1191 04/25/24 0603 04/26/24 0628  WBC 17.2* 14.9* 16.0* 16.2* 13.8*  HGB 11.6* 10.9* 11.1* 10.9* 10.8*  HCT 36.5 35.0* 35.2* 34.0* 35.0*  MCV 85.5 84.7 84.2 82.9 84.5  PLT 653* 612* 686* 668* 750*   Cardiac Enzymes: No results for input(s): CKTOTAL, CKMB, CKMBINDEX, TROPONINI in the last 168 hours. BNP: Invalid input(s): POCBNP CBG: Recent Labs  Lab 04/25/24 1147 04/25/24 1641 04/25/24 2241 04/26/24 0829 04/26/24 1203  GLUCAP 94 110* 104* 123* 102*   D-Dimer No results for input(s): DDIMER in the last 72 hours. Hgb A1c No results for input(s): HGBA1C in the last 72 hours. Lipid Profile No results for input(s): CHOL, HDL, LDLCALC, TRIG, CHOLHDL, LDLDIRECT in the  last 72 hours. Thyroid function studies No results for input(s): TSH, T4TOTAL, T3FREE, THYROIDAB in the last 72 hours.  Invalid input(s): FREET3 Anemia work up No results for input(s): VITAMINB12, FOLATE, FERRITIN, TIBC, IRON , RETICCTPCT in the last 72 hours. Microbiology No results found for this or any previous visit (from the past 240 hours).  Time coordinating discharge: 45 minutes  Signed: Marjie Chea  [facility] 04/26/2024, 3:10 PM

## 2024-04-26 NOTE — Progress Notes (Signed)
 Dr Gwynneth Lessen requested GI to review recent CT abdomen with contrast that was done yesterday.  Patient is clinically improving, she is asymptomatic and he is planning to discharge the patient home today.  Reviewed CT images along with Dr. Venice Gillis, loculations and septations appear to be outside the pseudocyst, pseudocyst does not appear to be infected, feel it is a misread by radiology. Will obtain MRCP in 2 weeks to evaluate and plan for outpatient GI follow-up . Consider possible cyst gastrostomy as outpatient based on MRCP findings.

## 2024-04-26 NOTE — Telephone Encounter (Signed)
-----   Message from Nurse [place] B sent at 04/26/2024  4:22 PM EDT ----- Hugh Madura, when you are typing the recipient in make sure to type P before typing [facility] POD C TRIAGE. Thanks ----- Message ----- From: Arlee Bellows, NP Sent: 04/26/2024   2:58 PM EDT To: [facility] Pod B Triage  Hi, I was trying to get this to POD C ( [facility] patient) but couldn't find out how to do it. Can someone please forward to POD C and also write me back and tell me how to do it  :)  Patient needs an MRI  with and without contrast  / MRCP in two week. Please put under Dr. Dominic Friendly name. Dx. Pancreatitis /  pseudocysts.   Thanks

## 2024-05-17 ENCOUNTER — Encounter (HOSPITAL_COMMUNITY): Payer: Self-pay

## 2024-05-17 ENCOUNTER — Inpatient Hospital Stay (HOSPITAL_COMMUNITY)
Admission: EM | Admit: 2024-05-17 | Discharge: 2024-06-04 | DRG: 438 | Disposition: A | Discharge status: Other Acute Inpatient Hospital | Payer: MEDICAID | Attending: Internal Medicine | Admitting: Internal Medicine

## 2024-05-17 ENCOUNTER — Emergency Department (HOSPITAL_COMMUNITY): Payer: MEDICAID

## 2024-05-17 ENCOUNTER — Other Ambulatory Visit: Payer: Self-pay

## 2024-05-17 ENCOUNTER — Inpatient Hospital Stay (HOSPITAL_COMMUNITY): Payer: MEDICAID

## 2024-05-17 DIAGNOSIS — K8689 Other specified diseases of pancreas: Secondary | ICD-10-CM

## 2024-05-17 DIAGNOSIS — E876 Hypokalemia: Secondary | ICD-10-CM | POA: Diagnosis present

## 2024-05-17 DIAGNOSIS — E440 Moderate protein-calorie malnutrition: Secondary | ICD-10-CM | POA: Diagnosis present

## 2024-05-17 DIAGNOSIS — I899 Noninfective disorder of lymphatic vessels and lymph nodes, unspecified: Secondary | ICD-10-CM | POA: Diagnosis not present

## 2024-05-17 DIAGNOSIS — D649 Anemia, unspecified: Secondary | ICD-10-CM | POA: Diagnosis not present

## 2024-05-17 DIAGNOSIS — R69 Illness, unspecified: Secondary | ICD-10-CM

## 2024-05-17 DIAGNOSIS — D734 Cyst of spleen: Secondary | ICD-10-CM | POA: Diagnosis present

## 2024-05-17 DIAGNOSIS — D509 Iron deficiency anemia, unspecified: Secondary | ICD-10-CM | POA: Diagnosis present

## 2024-05-17 DIAGNOSIS — I82890 Acute embolism and thrombosis of other specified veins: Secondary | ICD-10-CM | POA: Diagnosis present

## 2024-05-17 DIAGNOSIS — J90 Pleural effusion, not elsewhere classified: Secondary | ICD-10-CM | POA: Diagnosis not present

## 2024-05-17 DIAGNOSIS — J45909 Unspecified asthma, uncomplicated: Secondary | ICD-10-CM | POA: Diagnosis present

## 2024-05-17 DIAGNOSIS — F17210 Nicotine dependence, cigarettes, uncomplicated: Secondary | ICD-10-CM | POA: Diagnosis present

## 2024-05-17 DIAGNOSIS — E66812 Obesity, class 2: Secondary | ICD-10-CM | POA: Diagnosis present

## 2024-05-17 DIAGNOSIS — R160 Hepatomegaly, not elsewhere classified: Secondary | ICD-10-CM | POA: Diagnosis present

## 2024-05-17 DIAGNOSIS — Z6839 Body mass index (BMI) 39.0-39.9, adult: Secondary | ICD-10-CM | POA: Diagnosis not present

## 2024-05-17 DIAGNOSIS — Z981 Arthrodesis status: Secondary | ICD-10-CM | POA: Diagnosis not present

## 2024-05-17 DIAGNOSIS — K2289 Other specified disease of esophagus: Secondary | ICD-10-CM | POA: Diagnosis not present

## 2024-05-17 DIAGNOSIS — K869 Disease of pancreas, unspecified: Secondary | ICD-10-CM | POA: Diagnosis not present

## 2024-05-17 DIAGNOSIS — K859 Acute pancreatitis without necrosis or infection, unspecified: Secondary | ICD-10-CM | POA: Diagnosis present

## 2024-05-17 DIAGNOSIS — R630 Anorexia: Secondary | ICD-10-CM | POA: Diagnosis not present

## 2024-05-17 DIAGNOSIS — E1165 Type 2 diabetes mellitus with hyperglycemia: Secondary | ICD-10-CM | POA: Diagnosis present

## 2024-05-17 DIAGNOSIS — E871 Hypo-osmolality and hyponatremia: Secondary | ICD-10-CM | POA: Diagnosis not present

## 2024-05-17 DIAGNOSIS — W57XXXS Bitten or stung by nonvenomous insect and other nonvenomous arthropods, sequela: Secondary | ICD-10-CM | POA: Diagnosis not present

## 2024-05-17 DIAGNOSIS — Z79899 Other long term (current) drug therapy: Secondary | ICD-10-CM | POA: Diagnosis not present

## 2024-05-17 DIAGNOSIS — K861 Other chronic pancreatitis: Secondary | ICD-10-CM | POA: Diagnosis present

## 2024-05-17 DIAGNOSIS — Z9049 Acquired absence of other specified parts of digestive tract: Secondary | ICD-10-CM

## 2024-05-17 DIAGNOSIS — K2970 Gastritis, unspecified, without bleeding: Secondary | ICD-10-CM | POA: Diagnosis not present

## 2024-05-17 DIAGNOSIS — K8500 Idiopathic acute pancreatitis without necrosis or infection: Secondary | ICD-10-CM | POA: Diagnosis present

## 2024-05-17 DIAGNOSIS — Z716 Tobacco abuse counseling: Secondary | ICD-10-CM

## 2024-05-17 DIAGNOSIS — Z751 Person awaiting admission to adequate facility elsewhere: Secondary | ICD-10-CM

## 2024-05-17 DIAGNOSIS — K760 Fatty (change of) liver, not elsewhere classified: Secondary | ICD-10-CM | POA: Diagnosis present

## 2024-05-17 DIAGNOSIS — I10 Essential (primary) hypertension: Secondary | ICD-10-CM | POA: Diagnosis present

## 2024-05-17 DIAGNOSIS — K838 Other specified diseases of biliary tract: Secondary | ICD-10-CM | POA: Diagnosis not present

## 2024-05-17 DIAGNOSIS — R110 Nausea: Secondary | ICD-10-CM | POA: Diagnosis not present

## 2024-05-17 DIAGNOSIS — K862 Cyst of pancreas: Secondary | ICD-10-CM | POA: Diagnosis not present

## 2024-05-17 DIAGNOSIS — E872 Acidosis, unspecified: Secondary | ICD-10-CM | POA: Diagnosis present

## 2024-05-17 DIAGNOSIS — E282 Polycystic ovarian syndrome: Secondary | ICD-10-CM | POA: Diagnosis present

## 2024-05-17 DIAGNOSIS — Z7984 Long term (current) use of oral hypoglycemic drugs: Secondary | ICD-10-CM | POA: Diagnosis not present

## 2024-05-17 DIAGNOSIS — Z743 Need for continuous supervision: Secondary | ICD-10-CM | POA: Diagnosis not present

## 2024-05-17 DIAGNOSIS — D72829 Elevated white blood cell count, unspecified: Secondary | ICD-10-CM | POA: Diagnosis not present

## 2024-05-17 DIAGNOSIS — R1084 Generalized abdominal pain: Secondary | ICD-10-CM

## 2024-05-17 DIAGNOSIS — K3189 Other diseases of stomach and duodenum: Secondary | ICD-10-CM | POA: Diagnosis not present

## 2024-05-17 DIAGNOSIS — K863 Pseudocyst of pancreas: Principal | ICD-10-CM | POA: Diagnosis present

## 2024-05-17 DIAGNOSIS — R1012 Left upper quadrant pain: Secondary | ICD-10-CM | POA: Diagnosis present

## 2024-05-17 LAB — CBC
HCT: 40 % (ref 36.0–46.0)
Hemoglobin: 12.8 g/dL (ref 12.0–15.0)
MCH: 25.7 pg — ABNORMAL LOW (ref 26.0–34.0)
MCHC: 32 g/dL (ref 30.0–36.0)
MCV: 80.2 fL (ref 80.0–100.0)
Platelets: 533 10*3/uL — ABNORMAL HIGH (ref 150–400)
RBC: 4.99 MIL/uL (ref 3.87–5.11)
RDW: 14.1 % (ref 11.5–15.5)
WBC: 17.3 10*3/uL — ABNORMAL HIGH (ref 4.0–10.5)
nRBC: 0 % (ref 0.0–0.2)

## 2024-05-17 LAB — I-STAT CG4 LACTIC ACID, ED
Lactic Acid, Venous: 0.8 mmol/L (ref 0.5–1.9)
Lactic Acid, Venous: 2.1 mmol/L (ref 0.5–1.9)

## 2024-05-17 LAB — HCG, SERUM, QUALITATIVE: Preg, Serum: NEGATIVE

## 2024-05-17 LAB — COMPREHENSIVE METABOLIC PANEL WITH GFR
ALT: 13 U/L (ref 0–44)
AST: 15 U/L (ref 15–41)
Albumin: 2.8 g/dL — ABNORMAL LOW (ref 3.5–5.0)
Alkaline Phosphatase: 80 U/L (ref 38–126)
Anion gap: 13 (ref 5–15)
BUN: 7 mg/dL (ref 6–20)
CO2: 23 mmol/L (ref 22–32)
Calcium: 9.5 mg/dL (ref 8.9–10.3)
Chloride: 100 mmol/L (ref 98–111)
Creatinine, Ser: 0.74 mg/dL (ref 0.44–1.00)
GFR, Estimated: >60 mL/min (ref >60)
Glucose, Bld: 105 mg/dL — ABNORMAL HIGH (ref 70–99)
Potassium: 3.3 mmol/L — ABNORMAL LOW (ref 3.5–5.1)
Sodium: 136 mmol/L (ref 135–145)
Total Bilirubin: 0.8 mg/dL (ref 0.0–1.2)
Total Protein: 7.2 g/dL (ref 6.5–8.1)

## 2024-05-17 LAB — LACTIC ACID, PLASMA: Lactic Acid, Venous: 0.8 mmol/L (ref 0.5–1.9)

## 2024-05-17 LAB — URINALYSIS, ROUTINE W REFLEX MICROSCOPIC
Bilirubin Urine: NEGATIVE
Glucose, UA: NEGATIVE mg/dL
Ketones, ur: NEGATIVE mg/dL
Leukocytes,Ua: NEGATIVE
Nitrite: NEGATIVE
Protein, ur: 30 mg/dL — AB
Specific Gravity, Urine: 1.011 (ref 1.005–1.030)
pH: 6 (ref 5.0–8.0)

## 2024-05-17 LAB — LIPASE, BLOOD: Lipase: 940 U/L — ABNORMAL HIGH (ref 11–51)

## 2024-05-17 MED ORDER — ONDANSETRON HCL 4 MG/2ML IJ SOLN
4.0000 mg | Freq: Four times a day (QID) | INTRAMUSCULAR | Status: DC | PRN
  Administered 2024-05-17 – 2024-06-02 (×6): 4 mg via INTRAVENOUS
  Filled 2024-05-17 (×6): qty 2

## 2024-05-17 MED ORDER — OXYCODONE HCL 5 MG PO TABS
5.0000 mg | ORAL_TABLET | ORAL | Status: DC | PRN
  Administered 2024-05-17 – 2024-05-23 (×10): 5 mg via ORAL
  Filled 2024-05-17 (×13): qty 1

## 2024-05-17 MED ORDER — ACETAMINOPHEN 500 MG PO TABS
1000.0000 mg | ORAL_TABLET | Freq: Four times a day (QID) | ORAL | Status: DC | PRN
  Administered 2024-05-29 – 2024-06-01 (×3): 1000 mg via ORAL
  Filled 2024-05-17 (×3): qty 2

## 2024-05-17 MED ORDER — GADOBUTROL 1 MMOL/ML IV SOLN
10.0000 mL | Freq: Once | INTRAVENOUS | Status: AC | PRN
  Administered 2024-05-17: 10 mL via INTRAVENOUS

## 2024-05-17 MED ORDER — LACTATED RINGERS IV BOLUS (SEPSIS)
1000.0000 mL | Freq: Once | INTRAVENOUS | Status: DC
  Administered 2024-05-17: 1000 mL via INTRAVENOUS

## 2024-05-17 MED ORDER — LACTATED RINGERS IV BOLUS (SEPSIS): 500.0000 mL | Freq: Once | INTRAVENOUS | Status: DC

## 2024-05-17 MED ORDER — LACTATED RINGERS IV BOLUS
500.0000 mL | Freq: Once | INTRAVENOUS | Status: AC
  Administered 2024-05-17: 500 mL via INTRAVENOUS

## 2024-05-17 MED ORDER — LACTATED RINGERS IV SOLN: INTRAVENOUS | Status: DC

## 2024-05-17 MED ORDER — LACTATED RINGERS IV BOLUS (SEPSIS)
1000.0000 mL | Freq: Once | INTRAVENOUS | Status: AC
  Administered 2024-05-17: 1000 mL via INTRAVENOUS

## 2024-05-17 MED ORDER — ALBUTEROL SULFATE (2.5 MG/3ML) 0.083% IN NEBU: 2.5000 mg | INHALATION_SOLUTION | RESPIRATORY_TRACT | Status: DC | PRN

## 2024-05-17 MED ORDER — SODIUM CHLORIDE 0.9 % IV SOLN
2.0000 g | Freq: Once | INTRAVENOUS | Status: AC
  Administered 2024-05-17: 2 g via INTRAVENOUS
  Filled 2024-05-17: qty 12.5

## 2024-05-17 MED ORDER — SODIUM CHLORIDE 0.9 % IV SOLN
2.0000 g | Freq: Three times a day (TID) | INTRAVENOUS | Status: DC
  Administered 2024-05-18 – 2024-05-20 (×9): 2 g via INTRAVENOUS
  Filled 2024-05-17 (×9): qty 12.5

## 2024-05-17 MED ORDER — METRONIDAZOLE 500 MG/100ML IV SOLN
500.0000 mg | Freq: Once | INTRAVENOUS | Status: AC
  Administered 2024-05-17: 500 mg via INTRAVENOUS
  Filled 2024-05-17: qty 100

## 2024-05-17 MED ORDER — LACTATED RINGERS IV SOLN
INTRAVENOUS | Status: DC
  Administered 2024-05-17: 150 mL/h via INTRAVENOUS

## 2024-05-17 MED ORDER — POTASSIUM CHLORIDE CRYS ER 20 MEQ PO TBCR
40.0000 meq | EXTENDED_RELEASE_TABLET | Freq: Once | ORAL | Status: AC
  Administered 2024-05-17: 40 meq via ORAL
  Filled 2024-05-17: qty 2

## 2024-05-17 MED ORDER — SODIUM CHLORIDE 0.9% FLUSH
3.0000 mL | Freq: Two times a day (BID) | INTRAVENOUS | Status: DC
  Administered 2024-05-17 – 2024-06-03 (×31): 3 mL via INTRAVENOUS

## 2024-05-17 MED ORDER — HYDROMORPHONE HCL 1 MG/ML IJ SOLN
0.5000 mg | INTRAMUSCULAR | Status: DC | PRN
  Administered 2024-05-17 – 2024-05-18 (×5): 0.5 mg via INTRAVENOUS
  Filled 2024-05-17 (×2): qty 1
  Filled 2024-05-17: qty 0.5
  Filled 2024-05-17: qty 1
  Filled 2024-05-17: qty 0.5

## 2024-05-17 MED ORDER — NICOTINE POLACRILEX 2 MG MT GUM: 2.0000 mg | CHEWING_GUM | OROMUCOSAL | Status: DC | PRN

## 2024-05-17 MED ORDER — OXYCODONE HCL 5 MG PO TABS
2.5000 mg | ORAL_TABLET | ORAL | Status: DC | PRN
  Administered 2024-05-20: 7.5 mg via ORAL
  Administered 2024-05-23: 2.5 mg via ORAL
  Filled 2024-05-17 (×2): qty 1

## 2024-05-17 MED ORDER — NICOTINE 14 MG/24HR TD PT24
14.0000 mg | MEDICATED_PATCH | Freq: Every day | TRANSDERMAL | Status: DC
  Administered 2024-05-17 – 2024-06-04 (×19): 14 mg via TRANSDERMAL
  Filled 2024-05-17 (×19): qty 1

## 2024-05-17 MED ORDER — LORAZEPAM 2 MG/ML PO CONC: 1.0000 mg | Freq: Once | ORAL | Status: DC | PRN

## 2024-05-17 MED ORDER — METRONIDAZOLE 500 MG/100ML IV SOLN
500.0000 mg | Freq: Two times a day (BID) | INTRAVENOUS | Status: DC
  Administered 2024-05-18 – 2024-05-20 (×6): 500 mg via INTRAVENOUS
  Filled 2024-05-17 (×6): qty 100

## 2024-05-17 MED ORDER — FENTANYL CITRATE PF 50 MCG/ML IJ SOSY
50.0000 ug | PREFILLED_SYRINGE | Freq: Once | INTRAMUSCULAR | Status: AC
  Administered 2024-05-17: 50 ug via INTRAVENOUS
  Filled 2024-05-17: qty 1

## 2024-05-17 MED ORDER — HYDROMORPHONE HCL 1 MG/ML IJ SOLN
1.0000 mg | Freq: Once | INTRAMUSCULAR | Status: AC
  Administered 2024-05-17: 1 mg via INTRAVENOUS
  Filled 2024-05-17: qty 1

## 2024-05-17 MED ORDER — IOHEXOL 350 MG/ML SOLN
65.0000 mL | Freq: Once | INTRAVENOUS | Status: AC | PRN
  Administered 2024-05-17: 65 mL via INTRAVENOUS

## 2024-05-17 MED ORDER — MELATONIN 3 MG PO TABS
6.0000 mg | ORAL_TABLET | Freq: Every evening | ORAL | Status: DC | PRN
  Administered 2024-05-18 – 2024-06-03 (×13): 6 mg via ORAL
  Filled 2024-05-17 (×13): qty 2

## 2024-05-17 MED ORDER — LORAZEPAM 2 MG/ML IJ SOLN
1.0000 mg | Freq: Once | INTRAMUSCULAR | Status: AC | PRN
  Administered 2024-05-17: 1 mg via INTRAVENOUS
  Filled 2024-05-17: qty 1

## 2024-05-17 MED ORDER — POLYETHYLENE GLYCOL 3350 17 G PO PACK
17.0000 g | PACK | Freq: Every day | ORAL | Status: DC | PRN
  Administered 2024-06-03 – 2024-06-04 (×2): 17 g via ORAL
  Filled 2024-05-17 (×2): qty 1

## 2024-05-17 MED ORDER — HYDROMORPHONE HCL 1 MG/ML IJ SOLN
1.0000 mg | INTRAMUSCULAR | Status: DC | PRN
  Administered 2024-05-17: 1 mg via INTRAVENOUS
  Filled 2024-05-17: qty 1

## 2024-05-17 MED ORDER — ONDANSETRON HCL 4 MG/2ML IJ SOLN
4.0000 mg | Freq: Once | INTRAMUSCULAR | Status: AC
  Administered 2024-05-17: 4 mg via INTRAVENOUS
  Filled 2024-05-17: qty 2

## 2024-05-17 MED ORDER — ONDANSETRON HCL 4 MG/2ML IJ SOLN
4.0000 mg | INTRAMUSCULAR | Status: DC | PRN
  Administered 2024-05-17: 4 mg via INTRAVENOUS
  Filled 2024-05-17: qty 2

## 2024-05-17 NOTE — ED Provider Notes (Signed)
 [place] EMERGENCY DEPARTMENT AT [facility] Provider Note   CSN: [id number] Arrival date & time: 05/17/24  1254     Patient presents with: Abdominal Pain   Katrina Lambert is a 38 y.o. female.   HPI PMH significant for morbid obesity, prediabetes, HTN, anxiety/depression/bipolar disorder, cervical myelopathy, tobacco abuse with recent hospitalization.  Patient was discharged 6\11\2025.  She was admitted with diagnosis of acute pancreatitis with multiple pseudocysts as well as pleural effusion that was drained by chest tube. Patient reports she was doing better after her hospitalization.  She was able to eat and drink and taking medications.  She reports about 2 days ago she started getting a lot of epigastric and left upper abdominal pain again and then severe nausea and anytime she eats something she vomits.  Reports she has not really been able to take her medications for 2 days.  She has missed about 2 days of blood pressure medications.  No fever that she is aware of but she reports sometimes she is getting chills.  No lower extremity swelling or calf pain.  She does endorse shortness of breath.    Prior to Admission medications   Medication Sig Start Date End Date Taking? Authorizing Provider  acetaminophen  (TYLENOL ) 500 MG tablet Take 500 mg by mouth every 6 (six) hours as needed for mild pain (pain score 1-3).   Yes [provider]  lisinopril  (ZESTRIL ) 10 MG tablet Take 10 mg by mouth daily.   Yes [provider]    Allergies: Patient has no known allergies.    Review of Systems  Updated Vital Signs BP (!) 154/99   Pulse 96   Temp 98.2 F (36.8 C) (Oral)   Resp 16   Ht 5' 5 (1.651 m)   Wt 108.9 kg   SpO2 96%   BMI 39.94 kg/m   Physical Exam Constitutional:      Comments: Alert nontoxic with clear mental status.  No respiratory distress.  HENT:     Mouth/Throat:     Pharynx: Oropharynx is clear.  Eyes:     Extraocular Movements:  Extraocular movements intact.  Cardiovascular:     Rate and Rhythm: Regular rhythm. Tachycardia present.  Pulmonary:     Effort: Pulmonary effort is normal.     Breath sounds: Normal breath sounds.  Abdominal:     Comments: Patient has left upper quadrant tenderness and epigastric tenderness.  Lower abdomen nontender.  No guarding.  Musculoskeletal:        General: Normal range of motion.     Comments: No peripheral edema.  Feet are warm and dry.  Calves are soft and pliable nontender.  Skin:    General: Skin is warm and dry.  Neurological:     General: No focal deficit present.     Mental Status: She is oriented to person, place, and time.     Motor: No weakness.     Coordination: Coordination normal.     (all labs ordered are listed, but only abnormal results are displayed) Labs Reviewed  LIPASE, BLOOD - Abnormal; Notable for the following components:      Result Value   Lipase 940 (*)    All other components within normal limits  COMPREHENSIVE METABOLIC PANEL WITH GFR - Abnormal; Notable for the following components:   Potassium 3.3 (*)    Glucose, Bld 105 (*)    Albumin 2.8 (*)    All other components within normal limits  CBC - Abnormal;  Notable for the following components:   WBC 17.3 (*)    MCH 25.7 (*)    Platelets 533 (*)    All other components within normal limits  URINALYSIS, ROUTINE W REFLEX MICROSCOPIC - Abnormal; Notable for the following components:   Hgb urine dipstick SMALL (*)    Protein, ur 30 (*)    Bacteria, UA RARE (*)    All other components within normal limits  I-STAT CG4 LACTIC ACID, ED - Abnormal; Notable for the following components:   Lactic Acid, Venous 2.1 (*)    All other components within normal limits  CULTURE, BLOOD (SINGLE)  HCG, SERUM, QUALITATIVE  BASIC METABOLIC PANEL WITH GFR  CBC  MAGNESIUM   PHOSPHORUS  I-STAT CG4 LACTIC ACID, ED    EKG: None  Radiology: CT CHEST ABDOMEN PELVIS W CONTRAST Result Date:  05/17/2024 CLINICAL DATA:  Sepsis EXAM: CT CHEST, ABDOMEN, AND PELVIS WITH CONTRAST TECHNIQUE: Multidetector CT imaging of the chest, abdomen and pelvis was performed following the standard protocol during bolus administration of intravenous contrast. RADIATION DOSE REDUCTION: This exam was performed according to the departmental dose-optimization program which includes automated exposure control, adjustment of the mA and/or kV according to patient size and/or use of iterative reconstruction technique. CONTRAST:  65mL OMNIPAQUE  IOHEXOL  350 MG/ML SOLN COMPARISON:  CT abdomen pelvis 04/25/2024 FINDINGS: CT CHEST FINDINGS Cardiovascular: Normal heart size. No significant pericardial effusion. The thoracic aorta is normal in caliber. At least single vessel atherosclerotic plaque of the thoracic aorta. Mild coronary artery calcifications. Mediastinum/Nodes: No enlarged mediastinal, hilar, or axillary lymph nodes. Thyroid gland, trachea, and esophagus demonstrate no significant findings. Lungs/Pleura: Left lower lobe atelectasis. No pulmonary nodule. No pulmonary mass. Slight interval increase of trace left pleural effusion. No pneumothorax. Musculoskeletal: No chest wall abnormality. No suspicious lytic or blastic osseous lesions. No acute displaced fracture. Multilevel mild degenerative changes of the spine. Partially visualized cervicothoracic spine demonstrates surgical hardware. CT ABDOMEN PELVIS FINDINGS Hepatobiliary: No focal liver abnormality. Status post cholecystectomy. No biliary dilatation. Pancreas: Interval increase in size of a large lobulated and multiloculated left upper quadrant cystic lesion arising from the pancreatic tail that again appears to be inseparable from the greater curvature of the stomach and the spleen. Finding demonstrates thick enhancing walls. The largest component measures 15 x 10.5 cm (from 10 x 6 cm). The cystic lesion abuts to the left hemidiaphragm and pushes the hemidiaphragm up.  Slightly hazy pancreatic tail contour with associated peripancreatic fat stranding. No main pancreatic ductal dilatation. Spleen: Interval concave scalloping of the splenic capsule with suggestion of invasion of the left upper quadrant cystic lesion. Interval development of adjacent punctate hypodensity (5:50). Spleen is normal in size. Adrenals/Urinary Tract: No adrenal nodule bilaterally. Bilateral kidneys enhance symmetrically. No hydronephrosis. No hydroureter. The urinary bladder is unremarkable. Stomach/Bowel: Stomach is within normal limits. No evidence of bowel wall thickening or dilatation. Vague fat stranding along the splenic flexure likely reactive changes with left upper quadrant abscess adjacent but not be inseparable (5:92). Appendix appears normal. Vascular/Lymphatic: No abdominal aorta or iliac aneurysm. Mild atherosclerotic plaque of the aorta and its branches. No abdominal, pelvic, or inguinal lymphadenopathy. Reproductive: T-shaped intrauterine device in appropriate position. Uterus and bilateral adnexa are unremarkable. Other: No intraperitoneal free fluid. No intraperitoneal free gas. No organized fluid collection. Musculoskeletal: No abdominal wall hernia or abnormality. No suspicious lytic or blastic osseous lesions. No acute displaced fracture. Multilevel degenerative changes of the spine. Stable grade 2 anterolisthesis of L3 on L4 with endplate sclerosis  and intervertebral disc space vacuum phenomenon. IMPRESSION: 1. Persistent acute pancreatitis with interval increase in size of a (up to 15 x 10.5 cm on axial imaging) pancreatic tail abscess that extends along the left upper quadrant is inseparable from the greater curvature of the stomach, spleen, inferior aspect of the left hemidiaphragm. Findings suggestive of invasion into the splenic capsule with possible adjacent second abscess forming within the splenic parenchyma-too small to characterize, recommend attention on follow-up. When the  patient is clinically stable and able to follow directions and hold their breath (preferably as an outpatient) further evaluation with dedicated abdominal MRI pancreatic protocol should be considered. 2. Slight interval increase of trace left pleural effusion. 3. Persistent elevated left hemidiaphragm with left base atelectasis. 4.  Aortic Atherosclerosis (ICD10-I70.0). 5. shaped intrauterine device in appropriate position. 6. 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 Electronically Signed   By: Morgane  Naveau M.D.   On: 05/17/2024 17:16     Procedures  CRITICAL CARE Performed by: Ludivina Shines   Total critical care time: 30 minutes  Critical care time was exclusive of separately billable procedures and treating other patients.  Critical care was necessary to treat or prevent imminent or life-threatening deterioration.  Critical care was time spent personally by me on the following activities: development of treatment plan with patient and/or surrogate as well as nursing, discussions with consultants, evaluation of patient's response to treatment, examination of patient, obtaining history from patient or surrogate, ordering and performing treatments and interventions, ordering and review of laboratory studies, ordering and review of radiographic studies, pulse oximetry and re-evaluation of patient's condition.  Medications Ordered in the ED  lactated ringers  infusion (0 mLs Intravenous Stopped 05/17/24 1932)  lactated ringers  infusion (150 mL/hr Intravenous New Bag/Given 05/17/24 1932)  lactated ringers  bolus 1,000 mL (0 mLs Intravenous Stopped 05/17/24 2023)    And  lactated ringers  bolus 1,000 mL (1,000 mLs Intravenous New Bag/Given 05/17/24 2005)    And  lactated ringers  bolus 1,000 mL (has no administration in time range)    And  lactated ringers  bolus 500 mL (has no administration in time range)  metroNIDAZOLE  (FLAGYL ) IVPB 500 mg (500 mg  Intravenous New Bag/Given 05/17/24 2000)  sodium chloride  flush (NS) 0.9 % injection 3 mL (has no administration in time range)  oxyCODONE  (Oxy IR/ROXICODONE ) immediate release tablet 2.5 mg (has no administration in time range)    Or  oxyCODONE  (Oxy IR/ROXICODONE ) immediate release tablet 5 mg (has no administration in time range)  HYDROmorphone  (DILAUDID ) injection 0.5 mg (has no administration in time range)  acetaminophen  (TYLENOL ) tablet 1,000 mg (has no administration in time range)  albuterol  (PROVENTIL ) (2.5 MG/3ML) 0.083% nebulizer solution 2.5 mg (has no administration in time range)  melatonin tablet 6 mg (has no administration in time range)  ondansetron  (ZOFRAN ) injection 4 mg (has no administration in time range)  polyethylene glycol (MIRALAX  / GLYCOLAX ) packet 17 g (has no administration in time range)  fentaNYL  (SUBLIMAZE ) injection 50 mcg (50 mcg Intravenous Given 05/17/24 1526)  lactated ringers  bolus 500 mL (0 mLs Intravenous Stopped 05/17/24 1810)  HYDROmorphone  (DILAUDID ) injection 1 mg (1 mg Intravenous Given 05/17/24 1635)  ondansetron  (ZOFRAN ) injection 4 mg (4 mg Intravenous Given 05/17/24 1635)  iohexol  (OMNIPAQUE ) 350 MG/ML injection 65 mL (65 mLs Intravenous Contrast Given 05/17/24 1652)  ceFEPIme  (MAXIPIME ) 2 g in sodium chloride  0.9 % 100 mL IVPB (0 g Intravenous Stopped 05/17/24 2022)  Medical Decision Making Amount and/or Complexity of Data Reviewed Labs: ordered. Radiology: ordered.  Risk Prescription drug management. Decision regarding hospitalization.   Patient presents as outlined.  She has a complex medical history with recent hospitalization with multiple pseudocyst pancreatitis and pleural effusions.  Patient describes worsening symptoms over the past 2 days with severe nausea, pain and inability to eat or take medications.  Will proceed with diagnostic evaluation and treatment for pain and nausea with Dilaudid  and  Zofran .  Lactic acid 2.1 lipase 940 pregnancy test negative GFR greater than 60 potassium 3.3 white count of 17.3 H&H 12 and 40 urinalysis negative  CT scan shows increasing amount of cystic structures with extension along the stomach and up to the splenic capsule as well.  Per radiology review concerning for abscess.  With 17,000 white count, worsening fluid collections and pain, will proceed with broad-spectrum antibiotics.  Patient will require readmission for further diagnostic and therapeutic treatment.  Consult: [facility] hospitalist Dr. Segars  for admission     Final diagnoses:  Acute pancreatitis, unspecified complication status, unspecified pancreatitis type  Severe comorbid illness    ED Discharge Orders     None          Armenta Canning, MD 05/17/24 2032

## 2024-05-17 NOTE — ED Provider Triage Note (Signed)
 Emergency Medicine Provider Triage Evaluation Note  KATHRYN COSBY , a 38 y.o. female  was evaluated in triage.  Pt complains of left upper quadrant and epigastric abdominal pain.  History of pancreatitis.  Has not had any alcohol in over a year.  No drug use.  Denies any fever or chills.  Review of Systems  Positive:  Negative: See above   Physical Exam  BP (!) 169/120 (BP Location: Right Arm)   Pulse (!) 116   Temp 98.4 F (36.9 C) (Oral)   Resp 18   Ht 5' 5 (1.651 m)   Wt 108.9 kg   SpO2 95%   BMI 39.94 kg/m  Gen:   Awake, no distress   Resp:  Normal effort  MSK:   Moves extremities without difficulty  Other:  Moderate left upper quadrant epigastric tenderness.  Tachycardic.  Medical Decision Making  Medically screening exam initiated at 3:01 PM.  Appropriate orders placed.  Alaynah L Shorkey was informed that the remainder of the evaluation will be completed by another provider, this initial triage assessment does not replace that evaluation, and the importance of remaining in the ED until their evaluation is complete.     Theotis Peers [place], [place] 05/17/24 [phone]

## 2024-05-17 NOTE — ED Notes (Signed)
 Patient lying in bed, states abdominal pain, MD messaged for pain medication.

## 2024-05-17 NOTE — ED Triage Notes (Signed)
 Pt to er, pt states that she was here a couple of week ago for the same, states that she is here for pancreatitis, states that she has been having pain, nausea and vomiting.  Pt states that she has been feeling poorly since May, states that two days for this episode

## 2024-05-17 NOTE — H&P (Addendum)
 History and Physical    Katrina Lambert FMW:[id number] DOB: 1986-02-01 DOA: 05/17/2024  PCP: [facility]   Patient coming from: Home   Chief Complaint:  Chief Complaint  Patient presents with   Abdominal Pain    HPI:  Katrina Lambert is a 38 y.o. female with hx of morbid obesity, diabetes type 2, hypertension, asthma, IDA, B12 deficiency, cervical myelopathy and current smoking, with recent history of idiopathic pancreatitis initially diagnosed 5/3 at [facility] (admitted 5/3-5/6), and subsequent admission at [facility] 5/31 - 6/11 with recurrent pancreatitis with course c/b pancreatic pseudocyst, as well as hospital acquired pneumonia and complicated parapneumoic effusion requiring chest tube placement. Returns with worsening abd pain and N/V. Reports symptoms have been ongoing since her past discharge although symptoms have been intermittent.  Recently symptoms have been more persistent.  Abdominal pain has worsened, located in the epigastrium and left upper quadrant and radiating into the left flank.  She has associated pleuritic pain.  Intermittent chills, no measured fever.  Has been nauseous, vomiting in the past day or so.  No hematemesis/coffee-ground emesis, no melena.  Reports decreased p.o. intake as well.  Continues smoking 1 pack/day.   Review of Systems:  ROS complete and negative except as marked above   No Known Allergies  Prior to Admission medications   Medication Sig Start Date End Date Taking? Authorizing Provider  acetaminophen  (TYLENOL ) 500 MG tablet Take 500 mg by mouth every 6 (six) hours as needed for mild pain (pain score 1-3).   Yes [provider]  lisinopril  (ZESTRIL ) 10 MG tablet Take 10 mg by mouth daily.   Yes [provider]    Past Medical History:  Diagnosis Date   Anxiety    Arthritis    bilateral hips and knees   Asthma    Bipolar disorder (HCC)    Depression    Dyspnea    GERD (gastroesophageal reflux disease)     Headache    Hypertension    Neuromuscular disorder (HCC)    PONV (postoperative nausea and vomiting)    Pre-diabetes     Past Surgical History:  Procedure Laterality Date   ANTERIOR CERVICAL DECOMP/DISCECTOMY FUSION N/A 06/02/2021   Procedure: ANTERIOR CERVICAL DISCECTOMY FUSION CERVICAL FOUR-FIVE, CERVICAL FIVE-SIX, CERVICAL SIX-SEVEN WITH CERVICAL SIX CORPECTOMY;  Surgeon: Dawley, Lani BROCKS, DO;  Location: [facility] OR;  Service: Neurosurgery;  Laterality: N/A;   CESAREAN SECTION     x 2   CHOLECYSTECTOMY     POSTERIOR CERVICAL FUSION/FORAMINOTOMY N/A 06/06/2021   Procedure: CERVICAL FOUR-FIVE, CERVICAL FIVE-SIX, CERVICAL SIX-SEVEN, CERVICAL SEVEN-THORACIC ONE POSTERIOR CERVICAL INSTRUMENTATION AND FUSION WITH CERVICAL FOUR-CERVICAL SEVEN LAMINECTOMY/DECOMPRESSION;  Surgeon: Dawley, Lani BROCKS, DO;  Location: [facility] OR;  Service: Neurosurgery;  Laterality: N/A;     reports that she has been smoking cigarettes. She has a 33 pack-year smoking history. She has never used smokeless tobacco. She reports that she does not currently use alcohol. She reports current drug use. Frequency: 7.00 times per week. Drug: Marijuana.  History reviewed. No pertinent family history.   Physical Exam: Vitals:   05/17/24 1715 05/17/24 1813 05/17/24 1915 05/17/24 2000  BP: (!) 157/104   (!) 154/99  Pulse: 95  (!) 109 96  Resp: 15  13 16   Temp:  98.2 F (36.8 C)    TempSrc:  Oral    SpO2: 98%  100% 96%  Weight:      Height:        Gen: Awake, alert, NAD  CV: Regular, normal S1, S2, no murmurs  Resp: Normal WOB, diminished in the left base, otherwise CTAB  Abd: Obese, hypoactive, moderate to severe tenderness in the epigastrium and left upper quadrant, tenderness along the left flank.  No rebound, guarding, rigidity. MSK: Symmetric, no edema  Skin: No rashes or lesions to exposed skin  Neuro: Alert and interactive  Psych: euthymic, appropriate    Data review:   Labs reviewed, notable for:   Lactate 0.8 ->  1 K3.3. Lipase 940 LFTs within normal limits hCG negative WBC 17 Hemoglobin 12, increased from prior likely hemoconcentrated.  Micro:  Results for orders placed or performed during the hospital encounter of 04/15/24  Blood culture (routine x 2)     Status: None   Collection Time: 04/15/24  8:15 PM   Specimen: BLOOD RIGHT HAND  Result Value Ref Range Status   Specimen Description BLOOD RIGHT HAND  Final   Special Requests   Final    BOTTLES DRAWN AEROBIC AND ANAEROBIC Blood Culture results may not be optimal due to an inadequate volume of blood received in culture bottles   Culture   Final    NO GROWTH 5 DAYS Performed at [facility], [address] [place], [place] [address]    Report Status 04/20/2024 FINAL  Final  Blood culture (routine x 2)     Status: None   Collection Time: 04/15/24  8:20 PM   Specimen: BLOOD LEFT ARM  Result Value Ref Range Status   Specimen Description BLOOD LEFT ARM  Final   Special Requests   Final    BOTTLES DRAWN AEROBIC AND ANAEROBIC Blood Culture results may not be optimal due to an inadequate volume of blood received in culture bottles   Culture   Final    NO GROWTH 5 DAYS Performed at [facility], [address], [place], [place] [address]    Report Status 04/20/2024 FINAL  Final  MRSA Next Gen by PCR, Nasal     Status: None   Collection Time: 04/15/24 11:13 PM  Result Value Ref Range Status   MRSA by PCR Next Gen NOT DETECTED NOT DETECTED Final    Comment: (NOTE) The GeneXpert MRSA Assay (FDA approved for NASAL specimens only), is one component of a comprehensive MRSA colonization surveillance program. It is not intended to diagnose MRSA infection nor to guide or monitor treatment for MRSA infections. Test performance is not FDA approved in patients less than 38 years old. Performed at [facility], [address] [place], [place] [address]   Respiratory (~20 pathogens) panel by PCR     Status: None   Collection  Time: 04/16/24  1:59 PM   Specimen: Nasopharyngeal Swab; Respiratory  Result Value Ref Range Status   Adenovirus NOT DETECTED NOT DETECTED Final   Coronavirus 229E NOT DETECTED NOT DETECTED Final    Comment: (NOTE) The Coronavirus on the Respiratory Panel, DOES NOT test for the novel  Coronavirus (2019 nCoV)    Coronavirus HKU1 NOT DETECTED NOT DETECTED Final   Coronavirus NL63 NOT DETECTED NOT DETECTED Final   Coronavirus OC43 NOT DETECTED NOT DETECTED Final   Metapneumovirus NOT DETECTED NOT DETECTED Final   Rhinovirus / Enterovirus NOT DETECTED NOT DETECTED Final   Influenza A NOT DETECTED NOT DETECTED Final   Influenza B NOT DETECTED NOT DETECTED Final   Parainfluenza Virus 1 NOT DETECTED NOT DETECTED Final   Parainfluenza Virus 2 NOT DETECTED NOT DETECTED Final   Parainfluenza  Virus 3 NOT DETECTED NOT DETECTED Final   Parainfluenza Virus 4 NOT DETECTED NOT DETECTED Final   Respiratory Syncytial Virus NOT DETECTED NOT DETECTED Final   Bordetella pertussis NOT DETECTED NOT DETECTED Final   Bordetella Parapertussis NOT DETECTED NOT DETECTED Final   Chlamydophila pneumoniae NOT DETECTED NOT DETECTED Final   Mycoplasma pneumoniae NOT DETECTED NOT DETECTED Final    Comment: Performed at [facility], [address] [place], [place] [address]    Imaging reviewed:  CT CHEST ABDOMEN PELVIS W CONTRAST Result Date: 05/17/2024 CLINICAL DATA:  Sepsis EXAM: CT CHEST, ABDOMEN, AND PELVIS WITH CONTRAST TECHNIQUE: Multidetector CT imaging of the chest, abdomen and pelvis was performed following the standard protocol during bolus administration of intravenous contrast. RADIATION DOSE REDUCTION: This exam was performed according to the departmental dose-optimization program which includes automated exposure control, adjustment of the mA and/or kV according to patient size and/or use of iterative reconstruction technique. CONTRAST:  65mL OMNIPAQUE  IOHEXOL  350 MG/ML SOLN COMPARISON:  CT abdomen  pelvis 04/25/2024 FINDINGS: CT CHEST FINDINGS Cardiovascular: Normal heart size. No significant pericardial effusion. The thoracic aorta is normal in caliber. At least single vessel atherosclerotic plaque of the thoracic aorta. Mild coronary artery calcifications. Mediastinum/Nodes: No enlarged mediastinal, hilar, or axillary lymph nodes. Thyroid gland, trachea, and esophagus demonstrate no significant findings. Lungs/Pleura: Left lower lobe atelectasis. No pulmonary nodule. No pulmonary mass. Slight interval increase of trace left pleural effusion. No pneumothorax. Musculoskeletal: No chest wall abnormality. No suspicious lytic or blastic osseous lesions. No acute displaced fracture. Multilevel mild degenerative changes of the spine. Partially visualized cervicothoracic spine demonstrates surgical hardware. CT ABDOMEN PELVIS FINDINGS Hepatobiliary: No focal liver abnormality. Status post cholecystectomy. No biliary dilatation. Pancreas: Interval increase in size of a large lobulated and multiloculated left upper quadrant cystic lesion arising from the pancreatic tail that again appears to be inseparable from the greater curvature of the stomach and the spleen. Finding demonstrates thick enhancing walls. The largest component measures 15 x 10.5 cm (from 10 x 6 cm). The cystic lesion abuts to the left hemidiaphragm and pushes the hemidiaphragm up. Slightly hazy pancreatic tail contour with associated peripancreatic fat stranding. No main pancreatic ductal dilatation. Spleen: Interval concave scalloping of the splenic capsule with suggestion of invasion of the left upper quadrant cystic lesion. Interval development of adjacent punctate hypodensity (5:50). Spleen is normal in size. Adrenals/Urinary Tract: No adrenal nodule bilaterally. Bilateral kidneys enhance symmetrically. No hydronephrosis. No hydroureter. The urinary bladder is unremarkable. Stomach/Bowel: Stomach is within normal limits. No evidence of bowel wall  thickening or dilatation. Vague fat stranding along the splenic flexure likely reactive changes with left upper quadrant abscess adjacent but not be inseparable (5:92). Appendix appears normal. Vascular/Lymphatic: No abdominal aorta or iliac aneurysm. Mild atherosclerotic plaque of the aorta and its branches. No abdominal, pelvic, or inguinal lymphadenopathy. Reproductive: T-shaped intrauterine device in appropriate position. Uterus and bilateral adnexa are unremarkable. Other: No intraperitoneal free fluid. No intraperitoneal free gas. No organized fluid collection. Musculoskeletal: No abdominal wall hernia or abnormality. No suspicious lytic or blastic osseous lesions. No acute displaced fracture. Multilevel degenerative changes of the spine. Stable grade 2 anterolisthesis of L3 on L4 with endplate sclerosis and intervertebral disc space vacuum phenomenon. IMPRESSION: 1. Persistent acute pancreatitis with interval increase in size of a (up to 15 x 10.5 cm on axial imaging) pancreatic tail abscess that extends along the left upper quadrant is inseparable from the greater curvature of the stomach, spleen, inferior aspect of  the left hemidiaphragm. Findings suggestive of invasion into the splenic capsule with possible adjacent second abscess forming within the splenic parenchyma-too small to characterize, recommend attention on follow-up. When the patient is clinically stable and able to follow directions and hold their breath (preferably as an outpatient) further evaluation with dedicated abdominal MRI pancreatic protocol should be considered. 2. Slight interval increase of trace left pleural effusion. 3. Persistent elevated left hemidiaphragm with left base atelectasis. 4.  Aortic Atherosclerosis (ICD10-I70.0). 5. shaped intrauterine device in appropriate position. 6. 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 Electronically Signed   By: Morgane  Naveau M.D.   On:  05/17/2024 17:16   Reviewed CT A/P:     ED Course:  Treated with 2.5 L IV fluid and started on 150 cc an hour, abx with cefepime , Flagyl .  Treated with Dilaudid , fentanyl  for pain.   Assessment/Plan:  38 y.o. female with hx CHRISOULA ZEGARRA is a 38 y.o. female with hx of morbid obesity, diabetes type 2, hypertension, asthma, IDA, B12 deficiency, cervical myelopathy and current smoking, with recent history of idiopathic pancreatitis initially diagnosed 5/3 at [facility] (admitted 5/3-5/6), and subsequent admission at [facility] 5/31 - 6/11 with recurrent pancreatitis with course c/b pancreatic pseudocyst, as well as hospital acquired pneumonia and complicated parapneumoic effusion requiring chest tube placement. Returns with worsening abd pain and N/V found to have enlarging peripancreatic collection concerning for abscess.  Acute on chronic pancreatitis, idiopathic Peripancreatic fluid collection concerning for abscess, concern for splenic invasion SIRS suspect mixed from infection / dehydration  Lactic acidosis  Presents with worsening abd pain, N/V. On initial evaluation afebrile, tachycardic in 100s. WBC 17 increased. Lactate 0.8 -> 2.1. Lipase 960, up from prior. Imaging from last admission CT 6/10 with large LUQ complex cystic lesion suggestive of infected pseudocyst or abscess, largest dimension 9.8 x 5.9 -> repeat CT chest / abd / pelvis this admission 7/2 with interval increase to 15 x 10.5 cm of  pancreatic tail abscess that extends along the left upper quadrant is inseparable from the greater curvature of the stomach, spleen, inferior aspect of the left hemidiaphragm. Findings suggestive of invasion into the splenic capsule with possible adjacent second abscess forming within the splenic parenchyma.  - Messaged Dr. Nandigman for GI [place] consult in AM  - Also ordered for IR eval for percutaneous drainage - MRI MRCP with and without contrast to further evaluate collection - Continue cefepime   2 g IV every 8 hours, Flagyl  500 mg IV every 12 - Follow-up blood culture, 1 set collected in ED - S/p 2.5 L IV fluid bolus, continue MIVF LR at 150/hr  -  Pain control: Tylenol  as needed mild, oxycodone  2.5/5 mg for moderate/severe, Dilaudid  0.5 mg IV every 4 hours as needed for breakthrough - N.p.o. except sips with meds  History recent hospital-acquired pneumonia and complex parapneumonic effusion s/p chest tube CT chest with slight interval increase in trace left pleural effusion, persistent elevated left hemidiaphragm and left base atelectasis - Albuterol  as needed, incentive spirometry  Smoking cessation Current smoker at 1 pack/day counseled on the importance of smoking cessation especially in setting of intra-abdominal infection and continued pancreatitis. - Nicotine  14 mg patch and gum prn, Rx at discharge.  Hypokalemia Repleted  Chronic medical problems:  Morbid obesity: Would benefit from weight loss outpatient Diabetes type 2: A1c 7.3% on 6/5. On metformin  for this/PCOS outpatient. Not currently requiring SSI Hypertension: hold home lisinopril   Asthma: Albuterol  as  needed IDA: Temporarily hold iron , can resume on patient's B12: Continue B12 Cervical myelopathy: Noted PCOS: On metformin  outpatient Mood disorder: Not on medication  Body mass index is 39.94 kg/m.    DVT prophylaxis:  SCDs Code Status:  Full Code Diet:  Diet Orders (From admission, onward)     Start     Ordered   05/17/24 2001  Diet NPO time specified Except for: Sips with Meds  Diet effective now       Question:  Except for  Answer:  Noralyn with Meds   05/17/24 2003           Family Communication:  None   Consults:  [facility], IR   Admission status:   Inpatient, Telemetry bed  Severity of Illness: The appropriate patient status for this patient is INPATIENT. Inpatient status is judged to be reasonable and necessary in order to provide the required intensity of service to ensure the patient's  safety. The patient's presenting symptoms, physical exam findings, and initial radiographic and laboratory data in the context of their chronic comorbidities is felt to place them at high risk for further clinical deterioration. Furthermore, it is not anticipated that the patient will be medically stable for discharge from the hospital within 2 midnights of admission.   * I certify that at the point of admission it is my clinical judgment that the patient will require inpatient hospital care spanning beyond 2 midnights from the point of admission due to high intensity of service, high risk for further deterioration and high frequency of surveillance required.*   Dorn Dawson, MD [facility]  How to contact the [facility] Attending or Consulting provider 7A - 7P or covering provider during after hours 7P -7A, for this patient.  Check the care team in [facility] and look for a) attending/consulting [facility] provider listed and b) the [facility] team listed Log into [email or web address] and use [facility] universal password to access. If you do not have the password, please contact the hospital operator. Locate the [facility] provider you are looking for under [facility] and page to a number that you can be directly reached. If you still have difficulty reaching the provider, please page the [facility] (Director on Call) for the Hospitalists listed on amion for assistance.  05/17/2024, 8:24 PM

## 2024-05-17 NOTE — Sepsis Progress Note (Addendum)
Elink monitoring for the code sepsis protocol.   Notified provider of need to order repeat lactic acid.

## 2024-05-17 NOTE — ED Notes (Signed)
 Patient transported to MRI

## 2024-05-18 ENCOUNTER — Inpatient Hospital Stay (HOSPITAL_COMMUNITY): Payer: MEDICAID

## 2024-05-18 DIAGNOSIS — Z716 Tobacco abuse counseling: Secondary | ICD-10-CM | POA: Diagnosis not present

## 2024-05-18 DIAGNOSIS — K859 Acute pancreatitis without necrosis or infection, unspecified: Secondary | ICD-10-CM | POA: Diagnosis not present

## 2024-05-18 DIAGNOSIS — K8500 Idiopathic acute pancreatitis without necrosis or infection: Secondary | ICD-10-CM | POA: Diagnosis not present

## 2024-05-18 DIAGNOSIS — J90 Pleural effusion, not elsewhere classified: Secondary | ICD-10-CM | POA: Diagnosis not present

## 2024-05-18 DIAGNOSIS — K8689 Other specified diseases of pancreas: Secondary | ICD-10-CM

## 2024-05-18 DIAGNOSIS — D72829 Elevated white blood cell count, unspecified: Secondary | ICD-10-CM

## 2024-05-18 DIAGNOSIS — K863 Pseudocyst of pancreas: Secondary | ICD-10-CM

## 2024-05-18 LAB — CBC WITH DIFFERENTIAL/PLATELET
Abs Immature Granulocytes: 0.09 10*3/uL — ABNORMAL HIGH (ref 0.00–0.07)
Basophils Absolute: 0.1 10*3/uL (ref 0.0–0.1)
Basophils Relative: 0 %
Eosinophils Absolute: 0.3 10*3/uL (ref 0.0–0.5)
Eosinophils Relative: 2 %
HCT: 40.7 % (ref 36.0–46.0)
Hemoglobin: 12.6 g/dL (ref 12.0–15.0)
Immature Granulocytes: 1 %
Lymphocytes Relative: 8 %
Lymphs Abs: 1.2 10*3/uL (ref 0.7–4.0)
MCH: 25.5 pg — ABNORMAL LOW (ref 26.0–34.0)
MCHC: 31 g/dL (ref 30.0–36.0)
MCV: 82.2 fL (ref 80.0–100.0)
Monocytes Absolute: 0.6 10*3/uL (ref 0.1–1.0)
Monocytes Relative: 4 %
Neutro Abs: 13.4 10*3/uL — ABNORMAL HIGH (ref 1.7–7.7)
Neutrophils Relative %: 85 %
Platelets: 619 10*3/uL — ABNORMAL HIGH (ref 150–400)
RBC: 4.95 MIL/uL (ref 3.87–5.11)
RDW: 14.2 % (ref 11.5–15.5)
WBC: 15.6 10*3/uL — ABNORMAL HIGH (ref 4.0–10.5)
nRBC: 0 % (ref 0.0–0.2)

## 2024-05-18 LAB — MAGNESIUM: Magnesium: 1.6 mg/dL — ABNORMAL LOW (ref 1.7–2.4)

## 2024-05-18 LAB — CBC
HCT: 36.2 % (ref 36.0–46.0)
Hemoglobin: 11.3 g/dL — ABNORMAL LOW (ref 12.0–15.0)
MCH: 25.2 pg — ABNORMAL LOW (ref 26.0–34.0)
MCHC: 31.2 g/dL (ref 30.0–36.0)
MCV: 80.6 fL (ref 80.0–100.0)
Platelets: 484 10*3/uL — ABNORMAL HIGH (ref 150–400)
RBC: 4.49 MIL/uL (ref 3.87–5.11)
RDW: 14.1 % (ref 11.5–15.5)
WBC: 13 10*3/uL — ABNORMAL HIGH (ref 4.0–10.5)
nRBC: 0 % (ref 0.0–0.2)

## 2024-05-18 LAB — BASIC METABOLIC PANEL WITH GFR
Anion gap: 9 (ref 5–15)
BUN: 7 mg/dL (ref 6–20)
CO2: 24 mmol/L (ref 22–32)
Calcium: 8.5 mg/dL — ABNORMAL LOW (ref 8.9–10.3)
Chloride: 101 mmol/L (ref 98–111)
Creatinine, Ser: 0.74 mg/dL (ref 0.44–1.00)
GFR, Estimated: >60 mL/min (ref >60)
Glucose, Bld: 106 mg/dL — ABNORMAL HIGH (ref 70–99)
Potassium: 3.5 mmol/L (ref 3.5–5.1)
Sodium: 134 mmol/L — ABNORMAL LOW (ref 135–145)

## 2024-05-18 LAB — GLUCOSE, CAPILLARY
Glucose-Capillary: 112 mg/dL — ABNORMAL HIGH (ref 70–99)
Glucose-Capillary: 138 mg/dL — ABNORMAL HIGH (ref 70–99)
Glucose-Capillary: 100 mg/dL — ABNORMAL HIGH (ref 70–99)

## 2024-05-18 LAB — PROTIME-INR
INR: 1.2 (ref 0.8–1.2)
Prothrombin Time: 15.8 s — ABNORMAL HIGH (ref 11.4–15.2)

## 2024-05-18 LAB — PHOSPHORUS: Phosphorus: 4.6 mg/dL (ref 2.5–4.6)

## 2024-05-18 MED ORDER — LACTATED RINGERS IV SOLN: INTRAVENOUS | Status: AC

## 2024-05-18 MED ORDER — HYDROMORPHONE HCL 1 MG/ML IJ SOLN
1.0000 mg | INTRAMUSCULAR | Status: DC | PRN
  Administered 2024-05-18: 0.5 mg via INTRAVENOUS
  Administered 2024-05-18 – 2024-05-19 (×7): 1 mg via INTRAVENOUS
  Filled 2024-05-18 (×8): qty 1

## 2024-05-18 MED ORDER — POTASSIUM CHLORIDE 10 MEQ/100ML IV SOLN: 10.0000 meq | INTRAVENOUS | Status: DC

## 2024-05-18 MED ORDER — PANTOPRAZOLE SODIUM 40 MG IV SOLR
40.0000 mg | INTRAVENOUS | Status: DC
  Administered 2024-05-18 – 2024-05-22 (×5): 40 mg via INTRAVENOUS
  Filled 2024-05-18 (×5): qty 10

## 2024-05-18 MED ORDER — MAGNESIUM SULFATE 2 GM/50ML IV SOLN
2.0000 g | Freq: Once | INTRAVENOUS | Status: AC
  Administered 2024-05-18: 2 g via INTRAVENOUS
  Filled 2024-05-18: qty 50

## 2024-05-18 MED ORDER — LABETALOL HCL 5 MG/ML IV SOLN
10.0000 mg | INTRAVENOUS | Status: DC | PRN
  Administered 2024-05-22: 10 mg via INTRAVENOUS
  Filled 2024-05-18: qty 4

## 2024-05-18 MED ORDER — POTASSIUM CHLORIDE CRYS ER 20 MEQ PO TBCR
40.0000 meq | EXTENDED_RELEASE_TABLET | Freq: Once | ORAL | Status: AC
  Administered 2024-05-18: 40 meq via ORAL
  Filled 2024-05-18: qty 2

## 2024-05-18 MED ORDER — INSULIN ASPART 100 UNIT/ML IJ SOLN: 0.0000 [IU] | INTRAMUSCULAR | Status: DC

## 2024-05-18 NOTE — Progress Notes (Signed)
 PROGRESS NOTE  Katrina Lambert FMW:[id number] DOB: 1986-06-08   PCP: [facility]  Patient is from: Home.  DOA: 05/17/2024 LOS: 1  Chief complaints Chief Complaint  Patient presents with   Abdominal Pain     Brief Narrative / Interim history: 38 year old F with PMH of DM-2, HTN, asthma, IDA, morbid obesity, B12 deficiency, cervical myelopathy, tobacco use disorder and recent hospitalization at [facility] from 5/3-5/6 for idiopathic pancreatitis and at [facility] 5/31/611 for recurrent pancreatitis with pseudocyst as well as hospital-acquired pneumonia complicated by parapneumonic effusion requiring chest tube placement returning to ED with worsening intermittent abdominal pain, nausea and vomiting since her discharge from the hospital.  Symptoms gotten more persistent and she presented to ED for evaluation.  In ED, slightly tachycardic with leukocytosis to 17.  Lactic acid 0.8 and 2.1.  Lipase was 960.  CT chest, abdomen and pelvis with interval increase of pancreatic tail fluid collection extending along the LUQ inseparable from the greater curvature of the stomach, spleen, inferior aspect of left hemidiaphragm concerning for splenic capsular invasion with possible adjacent second abscess forming within the splenic parenchyma.  GI and IR consulted.  Started on cefepime  and Flagyl .  MRI ordered.  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  IR and GI following.  Subjective: Seen and examined earlier this morning.  No major events overnight or this morning.  Continues to endorse significant abdominal pain across upper abdomen radiating to her back on the left.  She rates her pain 7/10.  She reports nausea but no emesis.  Denies fever  Objective: Vitals:   05/18/24 0600 05/18/24 0740 05/18/24 0800 05/18/24 0814  BP: (!) 158/109 (!) 162/116  (!) 165/115  Pulse:  99 97  (!) 105  Resp: 16 16  19   Temp:  98.1 F (36.7 C)  97.7 F (36.5 C)  TempSrc:  Oral  Oral  SpO2: 99% 97%  98%  Weight:   108.9 kg   Height:   5' 5 (1.651 m)     Examination:  GENERAL: No apparent distress.  Nontoxic. HEENT: MMM.  Vision and hearing grossly intact.  NECK: Supple.  No apparent JVD.  RESP:  No IWOB.  Fair aeration bilaterally. CVS:  RRR. Heart sounds normal.  ABD/GI/GU: BS+. Abd soft.  Tenderness across upper abdomen mainly in LUQ. MSK/EXT:  Moves extremities. No apparent deformity. No edema.  SKIN: no apparent skin lesion or wound NEURO: AA.  Oriented appropriately.  No apparent focal neuro deficit. PSYCH: Calm. Normal affect.   Consultants:  Gastroenterology Interventional radiology  Procedures: None  Microbiology summarized: Single set blood culture NGTD  Assessment and plan: Acute on chronic pancreatitis with enlarging pancreatic pseudocyst SIRS with tachycardia and leukocytosis:difficult to rule out infection.  Hemodynamically stable. Lactic acidosis: Mild and resolved. -Continue IV cefepime  and Flagyl . -Follow GI recommendation -Follow-up IR recommendation -Pain control, IV fluid, antiemetics and PPI   NIDDM-2 with hyperglycemia: A1c 7.3%.  Does not seem to be medication at home. -SSI very sensitive every 4 hours  Hypokalemia/hypomagnesemia -Monitor replenish K and Mg as appropriate  Tobacco use disorder -Encourage smoking cessation -Nicotine  patch  Essential hypertension -Pain control -IV labetalol  as needed   Asthma: Albuterol  as needed IDA: Stable.  Continue monitoring. B12: Continue B12 when able to take p.o. Cervical myelopathy: Noted PCOS: On metformin  outpatient? Mood disorder: Stable.  Not on medication  Class II  obesity Body mass index is 39.95 kg/m.           DVT prophylaxis:  SCDs Start: 05/17/24 2001  Code Status: Full code Family Communication: None at bedside Level of care: Telemetry  Medical Status is: Inpatient Remains inpatient appropriate because: Acute on chronic pancreatitis with pseudocyst and possible infection   Final disposition: Likely home once medically stable   55 minutes with more than 50% spent in reviewing records, counseling patient/family and coordinating care.   Sch Meds:  Scheduled Meds:  nicotine   14 mg Transdermal Daily   sodium chloride  flush  3 mL Intravenous Q12H   Continuous Infusions:  ceFEPime  (MAXIPIME ) IV Stopped (05/18/24 0347)   lactated ringers  150 mL/hr at 05/18/24 0341   metronidazole  500 mg (05/18/24 0831)   PRN Meds:.acetaminophen , albuterol , HYDROmorphone  (DILAUDID ) injection, melatonin, nicotine  polacrilex, ondansetron  (ZOFRAN ) IV, oxyCODONE  **OR** oxyCODONE , polyethylene glycol  Antimicrobials: Anti-infectives (From admission, onward)    Start     Dose/Rate Route Frequency Ordered Stop   05/18/24 0730  metroNIDAZOLE  (FLAGYL ) IVPB 500 mg        500 mg 100 mL/hr over 60 Minutes Intravenous Every 12 hours 05/17/24 2053     05/18/24 0330  ceFEPIme  (MAXIPIME ) 2 g in sodium chloride  0.9 % 100 mL IVPB        2 g 200 mL/hr over 30 Minutes Intravenous Every 8 hours 05/17/24 2053     05/17/24 1930  ceFEPIme  (MAXIPIME ) 2 g in sodium chloride  0.9 % 100 mL IVPB        2 g 200 mL/hr over 30 Minutes Intravenous  Once 05/17/24 1928 05/17/24 2022   05/17/24 1930  metroNIDAZOLE  (FLAGYL ) IVPB 500 mg        500 mg 100 mL/hr over 60 Minutes Intravenous  Once 05/17/24 1928 05/17/24 2059        I have personally reviewed the following labs and images: CBC: Recent Labs  Lab 05/17/24 1440 05/18/24 0329  WBC 17.3* 13.0*  HGB 12.8 11.3*  HCT 40.0 36.2  MCV 80.2 80.6  PLT 533* 484*   BMP &GFR Recent Labs  Lab 05/17/24 1440 05/18/24 0329  NA 136 134*  K 3.3* 3.5  CL 100 101  CO2 23 24  GLUCOSE 105* 106*  BUN 7 7  CREATININE 0.74 0.74  CALCIUM 9.5 8.5*  MG  --  1.6*  PHOS  --  4.6   Estimated Creatinine Clearance:  118.3 mL/min (by C-G formula based on SCr of 0.74 mg/dL). Liver & Pancreas: Recent Labs  Lab 05/17/24 1440  AST 15  ALT 13  ALKPHOS 80  BILITOT 0.8  PROT 7.2  ALBUMIN 2.8*   Recent Labs  Lab 05/17/24 1440  LIPASE 940*   No results for input(s): AMMONIA in the last 168 hours. Diabetic: No results for input(s): HGBA1C in the last 72 hours. No results for input(s): GLUCAP in the last 168 hours. Cardiac Enzymes: No results for input(s): CKTOTAL, CKMB, CKMBINDEX, TROPONINI in the last 168 hours. No results for input(s): PROBNP in the last 8760 hours. Coagulation Profile: Recent Labs  Lab 05/18/24 0823  INR 1.2   Thyroid Function Tests: No results for input(s): TSH, T4TOTAL, FREET4, T3FREE, THYROIDAB in the last 72 hours. Lipid Profile: No results for input(s): CHOL, HDL, LDLCALC, TRIG, CHOLHDL, LDLDIRECT in the last 72 hours. Anemia Panel: No results for input(s): VITAMINB12, FOLATE, FERRITIN, TIBC, IRON , RETICCTPCT in the last 72 hours. Urine analysis:    Component Value Date/Time   COLORURINE  YELLOW 05/17/2024 1302   APPEARANCEUR CLEAR 05/17/2024 1302   LABSPEC 1.011 05/17/2024 1302   PHURINE 6.0 05/17/2024 1302   GLUCOSEU NEGATIVE 05/17/2024 1302   HGBUR SMALL (A) 05/17/2024 1302   BILIRUBINUR NEGATIVE 05/17/2024 1302   KETONESUR NEGATIVE 05/17/2024 1302   PROTEINUR 30 (A) 05/17/2024 1302   UROBILINOGEN 0.2 12/20/2009 2301   NITRITE NEGATIVE 05/17/2024 1302   LEUKOCYTESUR NEGATIVE 05/17/2024 1302   Sepsis Labs: Invalid input(s): PROCALCITONIN, LACTICIDVEN  Microbiology: Recent Results (from the past 240 hours)  Culture, blood (single)     Status: None (Preliminary result)   Collection Time: 05/17/24  7:45 PM   Specimen: BLOOD  Result Value Ref Range Status   Specimen Description BLOOD SITE NOT SPECIFIED  Final   Special Requests   Final    BOTTLES DRAWN AEROBIC AND ANAEROBIC Blood Culture results may  not be optimal due to an inadequate volume of blood received in culture bottles   Culture   Final    NO GROWTH < 12 HOURS Performed at [facility], [address] [place], [place] [address]    Report Status PENDING  Incomplete    Radiology Studies: MR ABDOMEN MRCP W WO CONTAST Result Date: 05/18/2024 CLINICAL DATA:  ACUTE PANCREATITIS. EXAM: MRI ABDOMEN WITHOUT AND WITH CONTRAST (INCLUDING MRCP) TECHNIQUE: Multiplanar multisequence MR imaging of the abdomen was performed both before and after the administration of intravenous contrast. Heavily T2-weighted images of the biliary and pancreatic ducts were obtained, and three-dimensional MRCP images were rendered by post processing. CONTRAST:  10mL GADAVIST GADOBUTROL 1 MMOL/ML IV SOLN COMPARISON:  CT SCAN 05/17/2024 FINDINGS: Lower chest: Left base atelectasis with small left pleural effusion. Hepatobiliary: No focal abnormality identified in the liver. No substantial intrahepatic biliary duct dilatation. Gallbladder surgically absent. Common duct measures 11 mm in the hepatoduodenal ligament. Common bile duct measures 6 mm diameter just proximal to the ampulla. Pancreas: Relatively unremarkable appearance of the pancreatic head. Pancreatic parenchyma appears to enhance throughout with no definite MR features of pancreatic necrosis. There is edema in the body and tail of pancreas. No main duct dilatation. As seen on the recent CT there is a large complex cystic process in the left upper quadrant involving the wall of the proximal stomach, pancreatic tail, subdiaphragmatic space, and dome of the spleen. Spleen: Irregularity of the splenic capsule in the posterior dome of the spleen suggest disruption by the complex cystic process. Adrenals/Urinary Tract: No adrenal nodule or mass. Kidneys unremarkable. Stomach/Bowel: Complex fluid dissects in the wall of the proximal stomach. Duodenum unremarkable. No small bowel or colonic dilatation within the  visualized abdomen. Vascular/Lymphatic: Portal vein and superior mesenteric vein are patent. 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 Celiac axis and SMA are patent. Splenic artery appears to be patent. Upper normal lymph nodes in the hepatoduodenal ligament. Other: Irregular somewhat tubular rimr enhancing fluid collection in left paracolic gutter measures on the order of 10.4 x 2.5 x 4.1 cm Musculoskeletal: No focal suspicious marrow enhancement within the visualized bony anatomy. Advanced degenerative changes are seen at the level of the L3-4 disc space. IMPRESSION: 1. Large complex cystic process in the left upper quadrant involving the wall of the proximal stomach, pancreatic tail, subdiaphragmatic space, and dome of the spleen. Imaging features are compatible with sequelae of pancreatitis with pseudocyst formation. Dominant collection in the subdiaphragmatic space measures on the order of 12.5 x 12.7 x 7.9 cm. Superinfection not excluded by imaging. No definite MR features of  pancreatic necrosis. 2. Status post cholecystectomy with mild dilatation of the common duct in the hepatoduodenal ligament, likely related to postcholecystectomy state. Common bile duct nondilated just proximal to the ampulla. No choledocholithiasis. 3. Irregular somewhat tubular rim enhancing fluid collection in the left paracolic gutter measures on the order of 10.4 x 2.5 x 4.1 cm. Imaging features are compatible with pseudocyst. Superinfection not excluded by imaging. 4. 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 5. Left base atelectasis with small left pleural effusion. Electronically Signed   By: Camellia Candle M.D.   On: 05/18/2024 05:32   MR 3D Recon At Scanner Result Date: 05/18/2024 CLINICAL DATA:  ACUTE PANCREATITIS. EXAM: MRI ABDOMEN WITHOUT AND WITH CONTRAST (INCLUDING MRCP) TECHNIQUE: Multiplanar multisequence MR imaging of the abdomen was performed both before and after the administration of  intravenous contrast. Heavily T2-weighted images of the biliary and pancreatic ducts were obtained, and three-dimensional MRCP images were rendered by post processing. CONTRAST:  10mL GADAVIST GADOBUTROL 1 MMOL/ML IV SOLN COMPARISON:  CT SCAN 05/17/2024 FINDINGS: Lower chest: Left base atelectasis with small left pleural effusion. Hepatobiliary: No focal abnormality identified in the liver. No substantial intrahepatic biliary duct dilatation. Gallbladder surgically absent. Common duct measures 11 mm in the hepatoduodenal ligament. Common bile duct measures 6 mm diameter just proximal to the ampulla. Pancreas: Relatively unremarkable appearance of the pancreatic head. Pancreatic parenchyma appears to enhance throughout with no definite MR features of pancreatic necrosis. There is edema in the body and tail of pancreas. No main duct dilatation. As seen on the recent CT there is a large complex cystic process in the left upper quadrant involving the wall of the proximal stomach, pancreatic tail, subdiaphragmatic space, and dome of the spleen. Spleen: Irregularity of the splenic capsule in the posterior dome of the spleen suggest disruption by the complex cystic process. Adrenals/Urinary Tract: No adrenal nodule or mass. Kidneys unremarkable. Stomach/Bowel: Complex fluid dissects in the wall of the proximal stomach. Duodenum unremarkable. No small bowel or colonic dilatation within the visualized abdomen. Vascular/Lymphatic: Portal vein and superior mesenteric vein are patent. 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 Celiac axis and SMA are patent. Splenic artery appears to be patent. Upper normal lymph nodes in the hepatoduodenal ligament. Other: Irregular somewhat tubular rimr enhancing fluid collection in left paracolic gutter measures on the order of 10.4 x 2.5 x 4.1 cm Musculoskeletal: No focal suspicious marrow enhancement within the visualized bony anatomy. Advanced degenerative changes are seen at  the level of the L3-4 disc space. IMPRESSION: 1. Large complex cystic process in the left upper quadrant involving the wall of the proximal stomach, pancreatic tail, subdiaphragmatic space, and dome of the spleen. Imaging features are compatible with sequelae of pancreatitis with pseudocyst formation. Dominant collection in the subdiaphragmatic space measures on the order of 12.5 x 12.7 x 7.9 cm. Superinfection not excluded by imaging. No definite MR features of pancreatic necrosis. 2. Status post cholecystectomy with mild dilatation of the common duct in the hepatoduodenal ligament, likely related to postcholecystectomy state. Common bile duct nondilated just proximal to the ampulla. No choledocholithiasis. 3. Irregular somewhat tubular rim enhancing fluid collection in the left paracolic gutter measures on the order of 10.4 x 2.5 x 4.1 cm. Imaging features are compatible with pseudocyst. Superinfection not excluded by imaging. 4. 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 5. Left base atelectasis with small left pleural effusion. Electronically Signed   By: Camellia Candle M.D.   On: 05/18/2024 05:32  CT CHEST ABDOMEN PELVIS W CONTRAST Result Date: 05/17/2024 CLINICAL DATA:  Sepsis EXAM: CT CHEST, ABDOMEN, AND PELVIS WITH CONTRAST TECHNIQUE: Multidetector CT imaging of the chest, abdomen and pelvis was performed following the standard protocol during bolus administration of intravenous contrast. RADIATION DOSE REDUCTION: This exam was performed according to the departmental dose-optimization program which includes automated exposure control, adjustment of the mA and/or kV according to patient size and/or use of iterative reconstruction technique. CONTRAST:  65mL OMNIPAQUE  IOHEXOL  350 MG/ML SOLN COMPARISON:  CT abdomen pelvis 04/25/2024 FINDINGS: CT CHEST FINDINGS Cardiovascular: Normal heart size. No significant pericardial effusion. The thoracic aorta is normal in caliber. At least single vessel  atherosclerotic plaque of the thoracic aorta. Mild coronary artery calcifications. Mediastinum/Nodes: No enlarged mediastinal, hilar, or axillary lymph nodes. Thyroid gland, trachea, and esophagus demonstrate no significant findings. Lungs/Pleura: Left lower lobe atelectasis. No pulmonary nodule. No pulmonary mass. Slight interval increase of trace left pleural effusion. No pneumothorax. Musculoskeletal: No chest wall abnormality. No suspicious lytic or blastic osseous lesions. No acute displaced fracture. Multilevel mild degenerative changes of the spine. Partially visualized cervicothoracic spine demonstrates surgical hardware. CT ABDOMEN PELVIS FINDINGS Hepatobiliary: No focal liver abnormality. Status post cholecystectomy. No biliary dilatation. Pancreas: Interval increase in size of a large lobulated and multiloculated left upper quadrant cystic lesion arising from the pancreatic tail that again appears to be inseparable from the greater curvature of the stomach and the spleen. Finding demonstrates thick enhancing walls. The largest component measures 15 x 10.5 cm (from 10 x 6 cm). The cystic lesion abuts to the left hemidiaphragm and pushes the hemidiaphragm up. Slightly hazy pancreatic tail contour with associated peripancreatic fat stranding. No main pancreatic ductal dilatation. Spleen: Interval concave scalloping of the splenic capsule with suggestion of invasion of the left upper quadrant cystic lesion. Interval development of adjacent punctate hypodensity (5:50). Spleen is normal in size. Adrenals/Urinary Tract: No adrenal nodule bilaterally. Bilateral kidneys enhance symmetrically. No hydronephrosis. No hydroureter. The urinary bladder is unremarkable. Stomach/Bowel: Stomach is within normal limits. No evidence of bowel wall thickening or dilatation. Vague fat stranding along the splenic flexure likely reactive changes with left upper quadrant abscess adjacent but not be inseparable (5:92). Appendix  appears normal. Vascular/Lymphatic: No abdominal aorta or iliac aneurysm. Mild atherosclerotic plaque of the aorta and its branches. No abdominal, pelvic, or inguinal lymphadenopathy. Reproductive: T-shaped intrauterine device in appropriate position. Uterus and bilateral adnexa are unremarkable. Other: No intraperitoneal free fluid. No intraperitoneal free gas. No organized fluid collection. Musculoskeletal: No abdominal wall hernia or abnormality. No suspicious lytic or blastic osseous lesions. No acute displaced fracture. Multilevel degenerative changes of the spine. Stable grade 2 anterolisthesis of L3 on L4 with endplate sclerosis and intervertebral disc space vacuum phenomenon. IMPRESSION: 1. Persistent acute pancreatitis with interval increase in size of a (up to 15 x 10.5 cm on axial imaging) pancreatic tail abscess that extends along the left upper quadrant is inseparable from the greater curvature of the stomach, spleen, inferior aspect of the left hemidiaphragm. Findings suggestive of invasion into the splenic capsule with possible adjacent second abscess forming within the splenic parenchyma-too small to characterize, recommend attention on follow-up. When the patient is clinically stable and able to follow directions and hold their breath (preferably as an outpatient) further evaluation with dedicated abdominal MRI pancreatic protocol should be considered. 2. Slight interval increase of trace left pleural effusion. 3. Persistent elevated left hemidiaphragm with left base atelectasis. 4.  Aortic Atherosclerosis (ICD10-I70.0). 5. shaped intrauterine device in  appropriate position. 6. 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 Electronically Signed   By: Morgane  Naveau M.D.   On: 05/17/2024 17:16      Geoff Dacanay T. Coreon Simkins [facility]  If 7PM-7AM, please contact night-coverage [email or web address] 05/18/2024, 11:14 AM

## 2024-05-18 NOTE — Procedures (Signed)
 Vascular and Interventional Radiology Procedure Note  Patient: Katrina Lambert DOB: 1986/05/05 Medical Record Number: [id number] Note Date/Time: 05/18/24 1:10 PM   Performing Physician: Thom Hall, MD Assistant(s): None  Diagnosis: Peri-pancreatic fluid collection   Procedure: ASPIRATION of a PERI-PANCREATIC FLUID COLLECTION  Anesthesia: Local Anesthetic Complications: None Estimated Blood Loss: Minimal Specimens: Sent for Gram Stain, Aerobe Culture, Anerobe Culture, and Cytology  Findings:  Successful CT-guided aspiration of a peri-pancreatic fluid collection  100 mL of tan brown fluid was aspirated   See detailed procedure note with images in PACS. The patient tolerated the procedure well without incident or complication and was returned to Recovery in stable condition.    Thom Hall, MD [facility] Radiology   Pager. [phone] Clinic. [phone]

## 2024-05-18 NOTE — Significant Event (Signed)
 Rapid Response Event Note   Reason for Call :  Severe pain and tachycardia s/p IR procedure VS: T 97.65F, BP 134/91, HR 140, RR 19, SpO2 94% on room air  Initial Focused Assessment:  Pt AO, grunting in pain. Site unremarkable. Abdomen soft, round. Skin is warm, moist.   0.5 mg IV Dilaudid  given at 1504. PRN order updated to be for 1mg  IV Dilaudid  q2h. Additional 0.5 mg administered at 1526. Following administration, pt appears to relax a little. She appears drowsy and endorses the pain is easing off.  HR 119  Interventions:  -No intervention from rapid response RN  Plan of Care:  -PRN medication for pain  Event Summary:  MD Notified: per primary RN Call Time: 1510 Arrival Time: 1515 End Time: 1530  Leonor LITTIE Danker, RN

## 2024-05-18 NOTE — Progress Notes (Signed)
 NEW ADMISSION NOTE New Admission Note:   Arrival Method:  Patient arrived from ED  Mental Orientation: Alert and oriented x 4. Telemetry: 5M10, NSR Assessment: Completed Skin: Warm, dry and intact IV: R AC and L AC infusing LR 150 ml/hr Pain: 7/10 medication administered. Tubes: N/A Safety Measures: Safety Fall Prevention Plan has been given, discussed and signed Admission: Completed [facility] Orientation: Patient has been orientated to the room, unit and staff.  Family: None   Orders have been reviewed and implemented. Will continue to monitor the patient. Call light has been placed within reach and bed alarm has been activated.   Franciso Bodily, RN

## 2024-05-18 NOTE — Progress Notes (Signed)
 Interventional Radiology Brief Note:  IR asked to evaluate patient post procedure this afternoon after returning to unit and complaining of severe pain with tachycardia.   Upon arrival, patient is sleeping. Arouses but remain groggy likely due to recent IV pain medication.  States her pain is starting to ease off.   Site assessed. Band-aid remains in place.  No bloody drainage. No appreciable hematoma.  Suspect patient experiencing acute post procedural pain which is now under improved control with IV dilaudid .   IR remains available.   Cheyna Retana, MS RD PA-C

## 2024-05-18 NOTE — Consult Note (Signed)
 Chief Complaint: Patient was seen in consultation today for pancreatic pseudocyst  Referring Physician(s): Dr. Dorn Dawson  Supervising Physician: Hughes Simmonds  Patient Status: [facility] - Out-pt  History of Present Illness: Katrina Lambert is a 38 y.o. female with history of anxiety, depression, bipolar disorder, GERD, HTN, DM and prior pancreatitis with pseudocyst formation.  Of note, she was recently admitted to [facility] 5/31-6/11/25 for same.  IR was consulted for aspiration and drainage, however given she responded well to antibiotics with good pain control and adequate diet tolerance decision was made to drain her pleural fluid but continue conservative management related to her pseudocysts.  Patient did have a chest tube placed 04/18/24 which was discontinued prior to discharge home 6/7.    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  In the ED she was tachycardic with elevated WBC, lactic acid 2.1.  She has responded well to initial IV therapy and pain management.   IR consulted for reconsideration of aspiration and drainage.   Past Medical History:  Diagnosis Date   Anxiety    Arthritis    bilateral hips and knees   Asthma    Bipolar disorder (HCC)    Depression    Dyspnea    GERD (gastroesophageal reflux disease)    Headache    Hypertension    Neuromuscular disorder (HCC)    PONV (postoperative nausea and vomiting)    Pre-diabetes     Past Surgical History:  Procedure Laterality Date   ANTERIOR CERVICAL DECOMP/DISCECTOMY FUSION N/A 06/02/2021   Procedure: ANTERIOR CERVICAL DISCECTOMY FUSION CERVICAL FOUR-FIVE, CERVICAL FIVE-SIX, CERVICAL SIX-SEVEN WITH CERVICAL SIX CORPECTOMY;  Surgeon: Dawley, Lani BROCKS, DO;  Location: [facility] OR;  Service: Neurosurgery;  Laterality: N/A;   CESAREAN SECTION     x 2   CHOLECYSTECTOMY     POSTERIOR CERVICAL FUSION/FORAMINOTOMY N/A 06/06/2021   Procedure: CERVICAL FOUR-FIVE, CERVICAL FIVE-SIX, CERVICAL  SIX-SEVEN, CERVICAL SEVEN-THORACIC ONE POSTERIOR CERVICAL INSTRUMENTATION AND FUSION WITH CERVICAL FOUR-CERVICAL SEVEN LAMINECTOMY/DECOMPRESSION;  Surgeon: Dawley, Lani BROCKS, DO;  Location: [facility] OR;  Service: Neurosurgery;  Laterality: N/A;    Allergies: Patient has no known allergies.  Medications: Prior to Admission medications   Medication Sig Start Date End Date Taking? Authorizing Provider  acetaminophen  (TYLENOL ) 500 MG tablet Take 500 mg by mouth every 6 (six) hours as needed for mild pain (pain score 1-3).   Yes [provider]  lisinopril  (ZESTRIL ) 10 MG tablet Take 10 mg by mouth daily.   Yes [provider]     History reviewed. No pertinent family history.  Social History   Socioeconomic History   Marital status: Married    Spouse name: Not on file   Number of children: Not on file   Years of education: Not on file   Highest education level: Not on file  Occupational History   Not on file  Tobacco Use   Smoking status: Every Day    Current packs/day: 1.50    Average packs/day: 1.5 packs/day for 22.0 years (33.0 ttl pk-yrs)    Types: Cigarettes   Smokeless tobacco: Never  Vaping Use   Vaping status: Never Used  Substance and Sexual Activity   Alcohol use: Not Currently   Drug use: Yes    Frequency: 7.0 times per week    Types: Marijuana    Comment: dailiy use   Sexual activity: Not on file  Other Topics Concern   Not on file  Social History Narrative   Not  on file   Social Drivers of Health   Financial Resource Strain: Low Risk  (03/18/2024)   Received from [facility]   Overall Financial Resource Strain (CARDIA)    Difficulty of Paying Living Expenses: Not very hard  Food Insecurity: No Food Insecurity (04/15/2024)   Hunger Vital Sign    Worried About Running Out of Food in the Last Year: Never true    Ran Out of Food in the Last Year: Never true  Transportation Needs: No Transportation Needs (04/15/2024)   PRAPARE - Therapist, art (Medical): No    Lack of Transportation (Non-Medical): No  Recent Concern: Transportation Needs - Unmet Transportation Needs (03/18/2024)   Received from [facility] - Transportation    Lack of Transportation (Medical): No    Lack of Transportation (Non-Medical): Yes  Physical Activity: Inactive (03/18/2024)   Received from [facility]   Exercise Vital Sign    On average, how many days per week do you engage in moderate to strenuous exercise (like a brisk walk)?: 0 days    On average, how many minutes do you engage in exercise at this level?: 0 min  Stress: Stress Concern Present (03/18/2024)   Received from [facility] - Occupational Stress Questionnaire    Feeling of Stress : Rather much  Social Connections: Socially Isolated (03/18/2024)   Received from [facility]   Social Connection and Isolation Panel    In a typical week, how many times do you talk on the phone with family, friends, or neighbors?: More than three times a week    How often do you get together with friends or relatives?: More than three times a week    How often do you attend church or religious services?: Never    Do you belong to any clubs or organizations such as church groups, unions, fraternal or athletic groups, or school groups?: No    How often do you attend meetings of the clubs or organizations you belong to?: Never    Are you married, widowed, divorced, separated, never married, or living with a partner?: Separated     Review of Systems: A 12 point ROS discussed and pertinent positives are indicated in the HPI above.  All other systems are negative.  Review of Systems  Constitutional:  Negative for fatigue and fever.  Respiratory:  Negative for cough and shortness of breath.   Cardiovascular:  Negative for chest pain.  Gastrointestinal:  Negative for abdominal pain.  Musculoskeletal:  Negative for back pain.   Psychiatric/Behavioral:  Negative for behavioral problems and confusion.     Vital Signs: BP (!) 165/115 (BP Location: Right Arm)   Pulse (!) 105   Temp 97.7 F (36.5 C) (Oral)   Resp 19   Ht 5' 5 (1.651 m)   Wt 240 lb 1.3 oz (108.9 kg)   SpO2 98%   BMI 39.95 kg/m   Physical Exam Vitals and nursing note reviewed.  Constitutional:      General: She is not in acute distress.    Appearance: She is well-developed. She is not ill-appearing.  Cardiovascular:     Rate and Rhythm: Normal rate and regular rhythm.  Pulmonary:     Effort: Pulmonary effort is normal. No respiratory distress.  Abdominal:     General: Abdomen is flat.     Palpations: Abdomen is soft.     Tenderness:  There is abdominal tenderness.  Skin:    General: Skin is warm and dry.  Neurological:     General: No focal deficit present.     Mental Status: She is alert and oriented to person, place, and time.  Psychiatric:        Mood and Affect: Mood normal.        Behavior: Behavior normal.     Imaging: MR ABDOMEN MRCP W WO CONTAST Result Date: 05/18/2024 CLINICAL DATA:  ACUTE PANCREATITIS. EXAM: MRI ABDOMEN WITHOUT AND WITH CONTRAST (INCLUDING MRCP) TECHNIQUE: Multiplanar multisequence MR imaging of the abdomen was performed both before and after the administration of intravenous contrast. Heavily T2-weighted images of the biliary and pancreatic ducts were obtained, and three-dimensional MRCP images were rendered by post processing. CONTRAST:  10mL GADAVIST GADOBUTROL 1 MMOL/ML IV SOLN COMPARISON:  CT SCAN 05/17/2024 FINDINGS: Lower chest: Left base atelectasis with small left pleural effusion. Hepatobiliary: No focal abnormality identified in the liver. No substantial intrahepatic biliary duct dilatation. Gallbladder surgically absent. Common duct measures 11 mm in the hepatoduodenal ligament. Common bile duct measures 6 mm diameter just proximal to the ampulla. Pancreas: Relatively unremarkable appearance of the  pancreatic head. Pancreatic parenchyma appears to enhance throughout with no definite MR features of pancreatic necrosis. There is edema in the body and tail of pancreas. No main duct dilatation. As seen on the recent CT there is a large complex cystic process in the left upper quadrant involving the wall of the proximal stomach, pancreatic tail, subdiaphragmatic space, and dome of the spleen. Spleen: Irregularity of the splenic capsule in the posterior dome of the spleen suggest disruption by the complex cystic process. Adrenals/Urinary Tract: No adrenal nodule or mass. Kidneys unremarkable. Stomach/Bowel: Complex fluid dissects in the wall of the proximal stomach. Duodenum unremarkable. No small bowel or colonic dilatation within the visualized abdomen. Vascular/Lymphatic: Portal vein and superior mesenteric vein are patent. 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 Celiac axis and SMA are patent. Splenic artery appears to be patent. Upper normal lymph nodes in the hepatoduodenal ligament. Other: Irregular somewhat tubular rimr enhancing fluid collection in left paracolic gutter measures on the order of 10.4 x 2.5 x 4.1 cm Musculoskeletal: No focal suspicious marrow enhancement within the visualized bony anatomy. Advanced degenerative changes are seen at the level of the L3-4 disc space. IMPRESSION: 1. Large complex cystic process in the left upper quadrant involving the wall of the proximal stomach, pancreatic tail, subdiaphragmatic space, and dome of the spleen. Imaging features are compatible with sequelae of pancreatitis with pseudocyst formation. Dominant collection in the subdiaphragmatic space measures on the order of 12.5 x 12.7 x 7.9 cm. Superinfection not excluded by imaging. No definite MR features of pancreatic necrosis. 2. Status post cholecystectomy with mild dilatation of the common duct in the hepatoduodenal ligament, likely related to postcholecystectomy state. Common bile duct  nondilated just proximal to the ampulla. No choledocholithiasis. 3. Irregular somewhat tubular rim enhancing fluid collection in the left paracolic gutter measures on the order of 10.4 x 2.5 x 4.1 cm. Imaging features are compatible with pseudocyst. Superinfection not excluded by imaging. 4. 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 5. Left base atelectasis with small left pleural effusion. Electronically Signed   By: Camellia Candle M.D.   On: 05/18/2024 05:32   MR 3D Recon At Scanner Result Date: 05/18/2024 CLINICAL DATA:  ACUTE PANCREATITIS. EXAM: MRI ABDOMEN WITHOUT AND WITH CONTRAST (INCLUDING MRCP) TECHNIQUE: Multiplanar multisequence MR imaging of  the abdomen was performed both before and after the administration of intravenous contrast. Heavily T2-weighted images of the biliary and pancreatic ducts were obtained, and three-dimensional MRCP images were rendered by post processing. CONTRAST:  10mL GADAVIST GADOBUTROL 1 MMOL/ML IV SOLN COMPARISON:  CT SCAN 05/17/2024 FINDINGS: Lower chest: Left base atelectasis with small left pleural effusion. Hepatobiliary: No focal abnormality identified in the liver. No substantial intrahepatic biliary duct dilatation. Gallbladder surgically absent. Common duct measures 11 mm in the hepatoduodenal ligament. Common bile duct measures 6 mm diameter just proximal to the ampulla. Pancreas: Relatively unremarkable appearance of the pancreatic head. Pancreatic parenchyma appears to enhance throughout with no definite MR features of pancreatic necrosis. There is edema in the body and tail of pancreas. No main duct dilatation. As seen on the recent CT there is a large complex cystic process in the left upper quadrant involving the wall of the proximal stomach, pancreatic tail, subdiaphragmatic space, and dome of the spleen. Spleen: Irregularity of the splenic capsule in the posterior dome of the spleen suggest disruption by the complex cystic process. Adrenals/Urinary  Tract: No adrenal nodule or mass. Kidneys unremarkable. Stomach/Bowel: Complex fluid dissects in the wall of the proximal stomach. Duodenum unremarkable. No small bowel or colonic dilatation within the visualized abdomen. Vascular/Lymphatic: Portal vein and superior mesenteric vein are patent. 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 Celiac axis and SMA are patent. Splenic artery appears to be patent. Upper normal lymph nodes in the hepatoduodenal ligament. Other: Irregular somewhat tubular rimr enhancing fluid collection in left paracolic gutter measures on the order of 10.4 x 2.5 x 4.1 cm Musculoskeletal: No focal suspicious marrow enhancement within the visualized bony anatomy. Advanced degenerative changes are seen at the level of the L3-4 disc space. IMPRESSION: 1. Large complex cystic process in the left upper quadrant involving the wall of the proximal stomach, pancreatic tail, subdiaphragmatic space, and dome of the spleen. Imaging features are compatible with sequelae of pancreatitis with pseudocyst formation. Dominant collection in the subdiaphragmatic space measures on the order of 12.5 x 12.7 x 7.9 cm. Superinfection not excluded by imaging. No definite MR features of pancreatic necrosis. 2. Status post cholecystectomy with mild dilatation of the common duct in the hepatoduodenal ligament, likely related to postcholecystectomy state. Common bile duct nondilated just proximal to the ampulla. No choledocholithiasis. 3. Irregular somewhat tubular rim enhancing fluid collection in the left paracolic gutter measures on the order of 10.4 x 2.5 x 4.1 cm. Imaging features are compatible with pseudocyst. Superinfection not excluded by imaging. 4. 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 5. Left base atelectasis with small left pleural effusion. Electronically Signed   By: Camellia Candle M.D.   On: 05/18/2024 05:32   CT CHEST ABDOMEN PELVIS W CONTRAST Result Date: 05/17/2024 CLINICAL  DATA:  Sepsis EXAM: CT CHEST, ABDOMEN, AND PELVIS WITH CONTRAST TECHNIQUE: Multidetector CT imaging of the chest, abdomen and pelvis was performed following the standard protocol during bolus administration of intravenous contrast. RADIATION DOSE REDUCTION: This exam was performed according to the departmental dose-optimization program which includes automated exposure control, adjustment of the mA and/or kV according to patient size and/or use of iterative reconstruction technique. CONTRAST:  65mL OMNIPAQUE  IOHEXOL  350 MG/ML SOLN COMPARISON:  CT abdomen pelvis 04/25/2024 FINDINGS: CT CHEST FINDINGS Cardiovascular: Normal heart size. No significant pericardial effusion. The thoracic aorta is normal in caliber. At least single vessel atherosclerotic plaque of the thoracic aorta. Mild coronary artery calcifications. Mediastinum/Nodes: No enlarged mediastinal,  hilar, or axillary lymph nodes. Thyroid gland, trachea, and esophagus demonstrate no significant findings. Lungs/Pleura: Left lower lobe atelectasis. No pulmonary nodule. No pulmonary mass. Slight interval increase of trace left pleural effusion. No pneumothorax. Musculoskeletal: No chest wall abnormality. No suspicious lytic or blastic osseous lesions. No acute displaced fracture. Multilevel mild degenerative changes of the spine. Partially visualized cervicothoracic spine demonstrates surgical hardware. CT ABDOMEN PELVIS FINDINGS Hepatobiliary: No focal liver abnormality. Status post cholecystectomy. No biliary dilatation. Pancreas: Interval increase in size of a large lobulated and multiloculated left upper quadrant cystic lesion arising from the pancreatic tail that again appears to be inseparable from the greater curvature of the stomach and the spleen. Finding demonstrates thick enhancing walls. The largest component measures 15 x 10.5 cm (from 10 x 6 cm). The cystic lesion abuts to the left hemidiaphragm and pushes the hemidiaphragm up. Slightly hazy  pancreatic tail contour with associated peripancreatic fat stranding. No main pancreatic ductal dilatation. Spleen: Interval concave scalloping of the splenic capsule with suggestion of invasion of the left upper quadrant cystic lesion. Interval development of adjacent punctate hypodensity (5:50). Spleen is normal in size. Adrenals/Urinary Tract: No adrenal nodule bilaterally. Bilateral kidneys enhance symmetrically. No hydronephrosis. No hydroureter. The urinary bladder is unremarkable. Stomach/Bowel: Stomach is within normal limits. No evidence of bowel wall thickening or dilatation. Vague fat stranding along the splenic flexure likely reactive changes with left upper quadrant abscess adjacent but not be inseparable (5:92). Appendix appears normal. Vascular/Lymphatic: No abdominal aorta or iliac aneurysm. Mild atherosclerotic plaque of the aorta and its branches. No abdominal, pelvic, or inguinal lymphadenopathy. Reproductive: T-shaped intrauterine device in appropriate position. Uterus and bilateral adnexa are unremarkable. Other: No intraperitoneal free fluid. No intraperitoneal free gas. No organized fluid collection. Musculoskeletal: No abdominal wall hernia or abnormality. No suspicious lytic or blastic osseous lesions. No acute displaced fracture. Multilevel degenerative changes of the spine. Stable grade 2 anterolisthesis of L3 on L4 with endplate sclerosis and intervertebral disc space vacuum phenomenon. IMPRESSION: 1. Persistent acute pancreatitis with interval increase in size of a (up to 15 x 10.5 cm on axial imaging) pancreatic tail abscess that extends along the left upper quadrant is inseparable from the greater curvature of the stomach, spleen, inferior aspect of the left hemidiaphragm. Findings suggestive of invasion into the splenic capsule with possible adjacent second abscess forming within the splenic parenchyma-too small to characterize, recommend attention on follow-up. When the patient is  clinically stable and able to follow directions and hold their breath (preferably as an outpatient) further evaluation with dedicated abdominal MRI pancreatic protocol should be considered. 2. Slight interval increase of trace left pleural effusion. 3. Persistent elevated left hemidiaphragm with left base atelectasis. 4.  Aortic Atherosclerosis (ICD10-I70.0). 5. shaped intrauterine device in appropriate position. 6. 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 Electronically Signed   By: Morgane  Naveau M.D.   On: 05/17/2024 17:16   CT ABDOMEN W CONTRAST Result Date: 04/25/2024 CLINICAL DATA:  Pancreatitis, persistent developing complication in patient with newly recurring epigastric pain and nausea with known subacute pancreatitis with pseudocysts. EXAM: CT ABDOMEN WITH CONTRAST TECHNIQUE: Multidetector CT imaging of the abdomen was performed using the standard protocol following bolus administration of intravenous contrast. RADIATION DOSE REDUCTION: This exam was performed according to the departmental dose-optimization program which includes automated exposure control, adjustment of the mA and/or kV according to patient size and/or use of iterative reconstruction technique. CONTRAST:  75mL OMNIPAQUE  IOHEXOL  350 MG/ML SOLN COMPARISON:  CT chest abdomen pelvis 04/15/2024 FINDINGS: Lower chest: Interval decrease in size of a trace left pleural effusion. Left lower lobe atelectasis. Hepatobiliary: No focal liver abnormality. Status post cholecystectomy. No biliary dilatation. Pancreas: No focal lesion. Persistent left upper quadrant mild fat stranding. No surrounding inflammatory changes. Main pancreatic duct dilatation. Large lobulated complex left upper quadrant cystic lesion arising from the pancreatic tail. The known fluid collection is markedly difficult to measure due to loculations and irregularity with interval increase in its largest component measuring 9.8 x 5.9  cm (from 4.8 x 2.1 cm) and 9.2 x 4.7 cm (from 8.6 x 3 cm). Finding extends along the greater curvature of the stomach. Spleen: Normal in size without focal abnormality other than in several fluid density lesion described above. Adrenals/visualized portions of the urinary Tract: No adrenal nodule bilaterally. Bilateral kidneys enhance symmetrically. No hydronephrosis. No hydroureter. On delayed imaging, there is no urothelial wall thickening and there are no filling defects in the opacified portions of the bilateral collecting systems or ureters. Stomach/visualized portions of the bowel: Stomach is decompressed with fluid density lesions in several wall from the gastric walls. No definite fistulization with the gastric lumen. No evidence of bowel wall thickening or dilatation. Vascular/Lymphatic: No abdominal aorta aneurysm. Mild atherosclerotic plaque of the aorta and its branches. No abdominal lymphadenopathy. Other: No intraperitoneal free fluid. No intraperitoneal free gas. No organized fluid collection. Musculoskeletal: No abdominal wall hernia or abnormality. No suspicious lytic or blastic osseous lesions. No acute displaced fracture. IMPRESSION: 1. Acute pancreatitis with main pancreatic duct dilatation and interval increase in large lobulated complex left upper quadrant cystic lesion arising from the pancreatic tail. The known fluid collection is markedly difficult to measure due to loculations and irregularity with interval increase in its largest component measuring 9.8 x 5.9 cm (from 4.8 x 2.1 cm) and 9.2 x 4.7 cm (from 8.6 x 3 cm). Findings suggestive of an infected pseudocyst or abscess extending along the greater curvature of the stomach. Underlying malignancy cannot be fully excluded. When the patient is clinically stable and able to follow directions and hold their breath (preferably as an outpatient) further evaluation with dedicated abdominal MRI pancreatic protocol should be considered. 2. Interval  decrease in trace left pleural effusion. Electronically Signed   By: Morgane  Naveau M.D.   On: 04/25/2024 17:31   DG CHEST PORT 1 VIEW Result Date: 04/21/2024 CLINICAL DATA:  [id number] Chest tube in place [id number] EXAM: PORTABLE CHEST - 1 VIEW COMPARISON:  04/20/2024 FINDINGS: Lower lung volumes. Left pleural drainage catheter is similarly positioned in the lung base. Subsegmental atelectasis in both lung bases. Trace left pleural effusion. No pneumothorax. Mild cardiomegaly. IMPRESSION: 1. Little significant interval change to the lungs and trace left pleural effusion. 2. Similar positioning of the left pleural drainage catheter . Electronically Signed   By: Rogelia Myers M.D.   On: 04/21/2024 08:12   DG CHEST PORT 1 VIEW Result Date: 04/20/2024 CLINICAL DATA:  Pleural effusion EXAM: PORTABLE CHEST 1 VIEW COMPARISON:  April 19, 2024 FINDINGS: Mild bilateral reticular interstitial infiltrates correlate with congestive changes. Left pleural catheter in place in the lateral costophrenic sulcus with a small left pleural effusion and reaction and hypoventilatory atelectasis of the left lower lobe. Small right pleural effusion. No pneumothorax. IMPRESSION: *Mild congestive changes. *Left pleural catheter in place with a small left pleural effusion and hypoventilatory atelectasis of the left lower lobe. Electronically Signed   By: Franky Chard M.D.   On: 04/20/2024 08:52  DG Abd 1 View Result Date: 04/19/2024 CLINICAL DATA:  Intractable abdominal pain EXAM: ABDOMEN - 1 VIEW COMPARISON:  Abdominal x-ray 12/07/2023. FINDINGS: Pleural drainage catheter projects over the lower left hemithorax. Bowel gas pattern is nonobstructive. IUD is present in the pelvis. Cholecystectomy clips are also present. No suspicious calcifications are identified. No acute fractures are seen. IMPRESSION: Nonobstructive bowel gas pattern. Electronically Signed   By: Greig Pique M.D.   On: 04/19/2024 22:33   DG CHEST PORT 1 VIEW Result  Date: 04/19/2024 CLINICAL DATA:  Pleural effusion EXAM: PORTABLE CHEST 1 VIEW COMPARISON:  Apr 15, 2024 FINDINGS: Left pleural catheter in the left lateral costophrenic sulcus with interval improvement in the left pleural effusion with persistent left lower lobe retrocardiac opacity indicating atelectasis and/or infiltrates or combination of both with a small residual pleural reaction Right lung free of infiltrates Postsurgical changes of the cervicothoracic spine. Heart normal size No pneumothorax IMPRESSION: Interval improvement in the left pleural effusion with persistent left lower lobe retrocardiac opacity indicating atelectasis and/or infiltrates or combination of both with a small residual pleural reaction. Electronically Signed   By: Franky Chard M.D.   On: 04/19/2024 08:30   CT [facility] PLEURAL DRAIN W/INDWELL CATH W/IMG GUIDE Result Date: 04/18/2024 INDICATION: 38 year old female with history of left parapneumonic effusion. EXAM: CT PERC PLEURAL DRAIN W/INDWELL CATH W/IMG GUIDE COMPARISON:  None Available. MEDICATIONS: The patient is currently admitted to the hospital and receiving intravenous antibiotics. The antibiotics were administered within an appropriate time frame prior to the initiation of the procedure. ANESTHESIA/SEDATION: Moderate (conscious) sedation was employed during this procedure. A total of Versed  3 mg and Fentanyl  50 mcg was administered intravenously. Moderate Sedation Time: 13 minutes. The patient's level of consciousness and vital signs were monitored continuously by radiology nursing throughout the procedure under my direct supervision. CONTRAST:  None COMPLICATIONS: None immediate. PROCEDURE: RADIATION DOSE REDUCTION: This exam was performed according to the departmental dose-optimization program which includes automated exposure control, adjustment of the mA and/or kV according to patient size and/or use of iterative reconstruction technique. Informed written consent was obtained  from the patient after a discussion of the risks, benefits and alternatives to treatment. The patient was placed right lateral decubitus on the CT gantry and a pre procedural CT was performed re-demonstrating the known fluid collection within the left pleural space. The procedure was planned. A timeout was performed prior to the initiation of the procedure. The left posterolateral and inferior thorax was prepped and draped in the usual sterile fashion. The overlying soft tissues were anesthetized with 1% lidocaine  with epinephrine . Appropriate trajectory was planned with the use of a 22 gauge spinal needle. An 18 gauge trocar needle was advanced into the pleural space and a short Amplatz super stiff wire was coiled within the collection. Appropriate positioning was confirmed with a limited CT scan. The tract was serially dilated allowing placement of a 14 French all-purpose drainage catheter. Appropriate positioning was confirmed with a limited postprocedural CT scan. A total of approximately 100 ml of translucent, straw-colored fluid was initially aspirated. The tube was connected to a pleurovac and sutured in place. A dressing was placed. The patient tolerated the procedure well without immediate post procedural complication. IMPRESSION: Successful CT guided placement of a 78 French all purpose drain catheter into the posterolateral and inferior left pleural space with aspiration of translucent, straw-colored fluid. Ester Sides, MD [facility] Radiology Electronically Signed   By: Ester Sides HERO.D.  On: 04/18/2024 15:02    Labs:  CBC: Recent Labs    04/25/24 0603 04/26/24 0628 05/17/24 1440 05/18/24 0329  WBC 16.2* 13.8* 17.3* 13.0*  HGB 10.9* 10.8* 12.8 11.3*  HCT 34.0* 35.0* 40.0 36.2  PLT 668* 750* 533* 484*    COAGS: Recent Labs    05/18/24 0823  INR 1.2    BMP: Recent Labs    04/25/24 0603 04/26/24 0628 05/17/24 1440  05/18/24 0329  NA 132* 135 136 134*  K 4.5 4.4 3.3* 3.5  CL 96* 99 100 101  CO2 25 25 23 24   GLUCOSE 106* 98 105* 106*  BUN 11 9 7 7   CALCIUM 8.8* 8.9 9.5 8.5*  CREATININE 0.85 0.91 0.74 0.74  GFRNONAA >60 >60 >60 >60    LIVER FUNCTION TESTS: Recent Labs    04/21/24 0618 04/23/24 0510 04/25/24 0603 04/26/24 0628 05/17/24 1440  BILITOT 0.3  --  0.5 0.4 0.8  AST 9*  --  12* 12* 15  ALT 11  --  13 13 13   ALKPHOS 60  --  58 80 80  PROT 5.9*  --  6.5 7.0 7.2  ALBUMIN 2.1* 2.3* 2.3* 2.4* 2.8*    TUMOR MARKERS: No results for input(s): AFPTM, CEA, CA199, CHROMGRNA in the last 8760 hours.  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  IR consulted for aspiration and drainage due to concern for abscess.  Overnight patient has responded to IV therapy with initiation of IV antibiotics and fluid resuscitation. She remains on IV Flagyl  and cefepime .  VSS, lactic acid improved to 0.8, WBC down to 13.0.  Dr. Hughes has discussed with GI.  Plan made to aspirate pseudocyst for culture and hold on drain placement in favor of possible cystgastrostomy  with Dr. Wilhelmenia next week.   Discussed with patient who is anxious but agreeable.    Risks and benefits discussed with the patient including bleeding, infection, damage to adjacent structures, and sepsis.  All of the patient's questions were answered, patient is agreeable to proceed. Consent signed and in chart.  Thank you for this interesting consult.  I greatly enjoyed meeting MODENA BELLEMARE and look forward to participating in their care.  A copy of this report was sent to the requesting provider on this date.  Electronically Signed: Daksha Koone Sue-Ellen Rane Blitch, PA 05/18/2024, 11:09 AM   I spent a total of 20 Minutes    in face to face in clinical consultation, greater than 50% of which was  counseling/coordinating care for pancreatic pseudocyst.

## 2024-05-18 NOTE — TOC CM/SW Note (Signed)
 Transition of Care [facility]) - Inpatient Brief Assessment   Patient Details  Name: Katrina Lambert MRN: [id number] Date of Birth: [birth year]  Transition of Care [facility]) CM/SW Contact:    Tom-Johnson, Harvest Muskrat, RN Phone Number: 05/18/2024, 2:38 PM   Clinical Narrative:  Patient presented to the ED with worsening Abdominal pain, N/V. Found to have enlarging Peripancreatic collection concerning for Abscess.  Patient was recently at [facility] with Idiopathic Pancreatitis Form 03/18/24-03/21/24 and admitted at [facility] from 05/31-06/11 with recurrent Pancreatitis.  Patient underwent Aspiration of a Peri-Pancreatic Fluid collection by IR today 05/18/24. Continues on IV abx.   From home daughter, has two children. Employed, independent with care and drive self. Does not have DME's at home.  PCP is Zelda Pizza, FNP-BC and uses [facility] in [place].   No TOC needs or recommendations noted at this time.  Patient not Medically ready for discharge.  CM will continue to follow for discharge needs as patient progresses with care towards discharge.              Transition of Care Asessment: Insurance and Status: Insurance coverage has been reviewed Patient has primary care physician: Yes Home environment has been reviewed: Yes Prior level of function:: Independent Prior/Current Home Services: No current home services Social Drivers of Health Review: SDOH reviewed no interventions necessary Readmission risk has been reviewed: Yes Transition of care needs: no transition of care needs at this time

## 2024-05-18 NOTE — Consult Note (Signed)
 Consultation  Referring Provider: TRH/Gonfa Primary Care Physician:  [facility] Primary Gastroenterologist:  Dr.Danis  Reason for Consultation: enlarging pancreatic pseudocyts   HPI: Katrina Lambert is a 38 y.o. female, with history of anxiety, bipolar disorder, GERD and morbid obesity who was initially diagnosed with acute pancreatitis during admission to [facility] 5/3 through 03/21/2024.  She had readmission there 515 through 518 and also was treated for left lower pneumonia pneumonia. She is status post previous cholecystectomy. She was admitted here 6/1 through 04/26/2024 and was seen by GI during that admission which time she was diagnosed with an evolving pseudocysts in setting of idiopathic pancreatitis.  She was seen by ID during that admission as well, not felt to have infected pancreatic pseudocyst but was covered with short course of antibiotics for left pleural effusion and recent pneumonia. Chest tube was placed on 04/18/2024.  There was question of parapneumonic effusion versus effusion related to pancreatitis.   She was able to be discharged by 611 and plan was to follow-up with Dr. Nikki in the office and for consideration of repeat imaging with MRI/MRCP in 2 to 3 weeks.  She has not been seen back in our office as yet. She presented to the ER yesterday reporting that she had noticed significant increase in her upper abdominal pain over the previous couple of days and had started having nausea and vomiting.  She had endorsed intermittent chills but no documented fever.  Primarily had been complaining of pain in the upper abdomen epigastrium left upper quadrant and some into her left flank.  Also having increased pain with inspiration.  Labs on admission WBC 17.3/hemoglobin 12.8/hematocrit 40.0/platelets 533 Potassium 3.3/BUN 7/creatinine 0.74 LFTs within normal limits/albumin 2.8 Lipase 940 Lactate 0.8 Blood cultures are pending  Imaging on admission with CT  then MRI/MRCP-CBD 11 mm to the hepatoduodenal ligament CBD 6 mm just proximal to the ampulla, relatively unremarkable appearance of the pancreatic head, pancreatic parenchyma appears to enhance throughout without definite features of pancreatic necrosis there is edema in the body and tail of the pancreas no main duct dilation and there is a large complex cystic process in the left upper quadrant involving the wall of the proximal stomach, pancreatic tail, subdiaphragmatic space and dome of the spleen.  In addition there is irregularity of the splenic capsule into the posterior dome of the spleen suggesting disruption by this complex cystic process, this fluid dissects into the wall of the proximal stomach.  The dominant collection in the subdiaphragmatic space measures 12.5 x 12.7 x 7.9 cm superinfection not excluded.  Patient has been started on Maxipime  and metronidazole   Afebrile overnight, and WBC down to 13 today  After discussion with radiology, it was felt this large pseudocyst would be best managed with cyst gastrostomy.  Decision was made to proceed with IR aspiration of the cyst to exclude infection and patient had just returned from that within the past hour. She had about 100 cc of fluid removed-fluid studies are pending  After patient returned from IR she was complaining of severe pain during my evaluation.  Blood pressure has been stable but she has been quite tachycardic into the 130s and diffusely diaphoretic.  She is complaining of pain in the upper abdomen/left upper quadrant and into her right shoulder blade.      Past Medical History:  Diagnosis Date   Anxiety    Arthritis    bilateral hips and knees   Asthma    Bipolar  disorder (HCC)    Depression    Dyspnea    GERD (gastroesophageal reflux disease)    Headache    Hypertension    Neuromuscular disorder (HCC)    PONV (postoperative nausea and vomiting)    Pre-diabetes     Past Surgical History:  Procedure  Laterality Date   ANTERIOR CERVICAL DECOMP/DISCECTOMY FUSION N/A 06/02/2021   Procedure: ANTERIOR CERVICAL DISCECTOMY FUSION CERVICAL FOUR-FIVE, CERVICAL FIVE-SIX, CERVICAL SIX-SEVEN WITH CERVICAL SIX CORPECTOMY;  Surgeon: Dawley, Lani BROCKS, DO;  Location: [facility] OR;  Service: Neurosurgery;  Laterality: N/A;   CESAREAN SECTION     x 2   CHOLECYSTECTOMY     POSTERIOR CERVICAL FUSION/FORAMINOTOMY N/A 06/06/2021   Procedure: CERVICAL FOUR-FIVE, CERVICAL FIVE-SIX, CERVICAL SIX-SEVEN, CERVICAL SEVEN-THORACIC ONE POSTERIOR CERVICAL INSTRUMENTATION AND FUSION WITH CERVICAL FOUR-CERVICAL SEVEN LAMINECTOMY/DECOMPRESSION;  Surgeon: Dawley, Lani BROCKS, DO;  Location: [facility] OR;  Service: Neurosurgery;  Laterality: N/A;    Prior to Admission medications   Medication Sig Start Date End Date Taking? Authorizing Provider  acetaminophen  (TYLENOL ) 500 MG tablet Take 500 mg by mouth every 6 (six) hours as needed for mild pain (pain score 1-3).   Yes [provider]  lisinopril  (ZESTRIL ) 10 MG tablet Take 10 mg by mouth daily.   Yes [provider]    Current Facility-Administered Medications  Medication Dose Route Frequency Provider Last Rate Last Admin   acetaminophen  (TYLENOL ) tablet 1,000 mg  1,000 mg Oral Q6H PRN Segars, Dorn, MD       albuterol  (PROVENTIL ) (2.5 MG/3ML) 0.083% nebulizer solution 2.5 mg  2.5 mg Nebulization Q4H PRN Segars, Dorn, MD       ceFEPIme  (MAXIPIME ) 2 g in sodium chloride  0.9 % 100 mL IVPB  2 g Intravenous Q8H Segars, Dorn, MD 200 mL/hr at 05/18/24 1113 2 g at 05/18/24 1113   HYDROmorphone  (DILAUDID ) injection 1 mg  1 mg Intravenous Q2H PRN Gonfa, Taye T, MD   1 mg at 05/18/24 1526   insulin  aspart (novoLOG ) injection 0-6 Units  0-6 Units Subcutaneous Q4H Gonfa, Taye T, MD       labetalol  (NORMODYNE ) injection 10 mg  10 mg Intravenous Q2H PRN Gonfa, Taye T, MD       lactated ringers  infusion   Intravenous Continuous Gonfa, Taye T, MD       melatonin tablet 6 mg  6 mg  Oral QHS PRN Segars, Jonathan, MD   6 mg at 05/18/24 0444   metroNIDAZOLE  (FLAGYL ) IVPB 500 mg  500 mg Intravenous Q12H Segars, Jonathan, MD 100 mL/hr at 05/18/24 0831 500 mg at 05/18/24 0831   nicotine  (NICODERM CQ  - dosed in mg/24 hours) patch 14 mg  14 mg Transdermal Daily Segars, Dorn, MD   14 mg at 05/18/24 1100   nicotine  polacrilex (NICORETTE) gum 2 mg  2 mg Oral PRN Segars, Jonathan, MD       ondansetron  (ZOFRAN ) injection 4 mg  4 mg Intravenous Q6H PRN Keturah Dorn, MD   4 mg at 05/17/24 2114   oxyCODONE  (Oxy IR/ROXICODONE ) immediate release tablet 2.5 mg  2.5 mg Oral Q4H PRN Keturah Dorn, MD       Or   oxyCODONE  (Oxy IR/ROXICODONE ) immediate release tablet 5 mg  5 mg Oral Q4H PRN Segars, Jonathan, MD   5 mg at 05/18/24 0832   pantoprazole (PROTONIX) injection 40 mg  40 mg Intravenous Q24H Gonfa, Taye T, MD   40 mg at 05/18/24 1518   polyethylene glycol (MIRALAX  / GLYCOLAX ) packet 17 g  17 g Oral Daily PRN Keturah Carrier, MD       sodium chloride  flush (NS) 0.9 % injection 3 mL  3 mL Intravenous Q12H Keturah Carrier, MD   3 mL at 05/18/24 1100    Allergies as of 05/17/2024   (No Known Allergies)    History reviewed. No pertinent family history.  Social History   Socioeconomic History   Marital status: Married    Spouse name: Not on file   Number of children: Not on file   Years of education: Not on file   Highest education level: Not on file  Occupational History   Not on file  Tobacco Use   Smoking status: Every Day    Current packs/day: 1.50    Average packs/day: 1.5 packs/day for 22.0 years (33.0 ttl pk-yrs)    Types: Cigarettes   Smokeless tobacco: Never  Vaping Use   Vaping status: Never Used  Substance and Sexual Activity   Alcohol use: Not Currently   Drug use: Yes    Frequency: 7.0 times per week    Types: Marijuana    Comment: dailiy use   Sexual activity: Not on file  Other Topics Concern   Not on file  Social History Narrative   Not on  file   Social Drivers of Health   Financial Resource Strain: Low Risk  (03/18/2024)   Received from [facility]   Overall Financial Resource Strain (CARDIA)    Difficulty of Paying Living Expenses: Not very hard  Food Insecurity: No Food Insecurity (05/18/2024)   Hunger Vital Sign    Worried About Running Out of Food in the Last Year: Never true    Ran Out of Food in the Last Year: Never true  Transportation Needs: No Transportation Needs (05/18/2024)   PRAPARE - Administrator, Civil Service (Medical): No    Lack of Transportation (Non-Medical): No  Recent Concern: Transportation Needs - Unmet Transportation Needs (03/18/2024)   Received from [facility] - Transportation    Lack of Transportation (Medical): No    Lack of Transportation (Non-Medical): Yes  Physical Activity: Inactive (03/18/2024)   Received from [facility]   Exercise Vital Sign    On average, how many days per week do you engage in moderate to strenuous exercise (like a brisk walk)?: 0 days    On average, how many minutes do you engage in exercise at this level?: 0 min  Stress: Stress Concern Present (03/18/2024)   Received from [facility] - Occupational Stress Questionnaire    Feeling of Stress : Rather much  Social Connections: Socially Isolated (03/18/2024)   Received from [facility]   Social Connection and Isolation Panel    In a typical week, how many times do you talk on the phone with family, friends, or neighbors?: More than three times a week    How often do you get together with friends or relatives?: More than three times a week    How often do you attend church or religious services?: Never    Do you belong to any clubs or organizations such as church groups, unions, fraternal or athletic groups, or school groups?: No    How often do you attend meetings of the clubs or organizations you belong to?: Never    Are you married,  widowed, divorced, separated, never married, or living with a partner?: Separated  Intimate Partner Violence:  Not At Risk (05/18/2024)   Humiliation, Afraid, Rape, and Kick questionnaire    Fear of Current or Ex-Partner: No    Emotionally Abused: No    Physically Abused: No    Sexually Abused: No    Review of Systems: Pertinent positive and negative review of systems were noted in the above HPI section.  All other review of systems was otherwise negative.   Physical Exam: Vital signs in last 24 hours: Temp:  [97.5 F (36.4 C)-98.4 F (36.9 C)] 97.5 F (36.4 C) (07/03 1514) Pulse Rate:  [95-140] 140 (07/03 1514) Resp:  [13-21] 19 (07/03 1514) BP: (134-186)/(91-120) 134/91 (07/03 1514) SpO2:  [94 %-100 %] 94 % (07/03 1514) Weight:  [108.9 kg] 108.9 kg (07/03 0800) Last BM Date : 05/17/24 General:   Alert,  Well-developed, obese young white female-complaining of severe pain and looks very uncomfortable somewhat diaphoretic.  Just received 0.5 Dilaudid  Head:  Normocephalic and atraumatic. Eyes:  Sclera clear, no icterus.   Conjunctiva pink. Ears:  Normal auditory acuity. Nose:  No deformity, discharge,  or lesions. Mouth:  No deformity or lesions.   Neck:  Supple; no masses or thyromegaly. Lungs: Decreased breath sounds in the bases  heart: Tachycardic regular rate and rhythm; no murmurs, clicks, rubs,  or gallops. Abdomen: Diffusely tender across the upper abdomen with guarding with firmness/mass effect across much of the upper abdomen, bowel sounds are quiet Rectal: Not done Msk:  Symmetrical without gross deformities. . Pulses:  Normal pulses noted. Extremities:  Without clubbing or edema. Neurologic:  Alert and  oriented x4;  grossly normal neurologically. Skin:  Intact without significant lesions or rashes.. Psych:  Alert and cooperative. Normal mood and affect.  Intake/Output from previous day: 07/02 0701 - 07/03 0700 In: 2599.4 [I.V.:3; IV Piggyback:2596.4] Out: -   Intake/Output this shift: No intake/output data recorded.  Lab Results: Recent Labs    05/17/24 1440 05/18/24 0329  WBC 17.3* 13.0*  HGB 12.8 11.3*  HCT 40.0 36.2  PLT 533* 484*   BMET Recent Labs    05/17/24 1440 05/18/24 0329  NA 136 134*  K 3.3* 3.5  CL 100 101  CO2 23 24  GLUCOSE 105* 106*  BUN 7 7  CREATININE 0.74 0.74  CALCIUM 9.5 8.5*   LFT Recent Labs    05/17/24 1440  PROT 7.2  ALBUMIN 2.8*  AST 15  ALT 13  ALKPHOS 80  BILITOT 0.8   PT/INR Recent Labs    05/18/24 0823  LABPROT 15.8*  INR 1.2   Hepatitis Panel No results for input(s): HEPBSAG, HCVAB, HEPAIGM, HEPBIGM in the last 72 hours.   IMPRESSION:  #43 38 year old white female with acute severe pancreatitis, initial onset 03/18/2024 at which time she was hospitalized at [facility].  She is status post previous cholecystectomy and, to date pancreatitis is felt to be idiopathic. Initial course was complicated by a left lower lobe pneumonia. Eventually hospitalized here 6/1 through 04/26/2024 with worsening abdominal pain and was diagnosed with severe pancreatitis with evolving pseudocyst with a dominant pseudocyst read as lobulated a complex arising from the pancreatic tail and measuring 9.8 x 5.9 cm.  She did require chest tube for drainage of large left pleural effusion during that admission as well.  No fluid studies were sent at that time.  Presented back to the emergency room yesterday with progressive abdominal pain over the past couple of days associated with nausea and vomiting. Found to have significant leukocytosis at 17.3  Repeat imaging  with CT then MRI as outlined above with enlargement of the large lobulated multi loculated cystic lesion in the left upper quadrant arising from the pancreatic tail now measuring 15 x 10.5 cm abutting the left hemidiaphragm also abutting the stomach and read as possibly dissecting the stomach and also involving the splenic capsule.  There  has been concern about possible infected pseudocyst, she was evaluated by IR, not appropriate candidate for drainage of this complex pseudocyst but did undergo IR aspiration to rule out infection earlier this afternoon  Fluid studies pending  #2 acute severe pain post cyst aspiration this afternoon associated diaphoresis and tachycardia IR aware and following  #3 status post previous cholecystectomy #4 morbid obesity 5.  Smoker  6.  Bipolar disorder  Plan; would keep n.p.o. for today Check stat hemoglobin, and will repeat hemoglobin every 12 hours rule out any bleeding postprocedure today Stat abdominal films Continue IV Maxipime  and metronidazole  Await cyst aspiration fluid studies From GI perspective, advanced endoscopist Dr. Arville Roddy will return on Monday, and will have him review her images to assess for maturity of this large complex pseudocyst.  Her initial presentation was 2 months ago so it is possible that this may be mature enough to safely do cyst gastrostomy. If she is unable to tolerate p.o.'s at least clear liquids by tomorrow we may need to consider core track placement. Agree with with liberal IV fluids at 150 cc/h/LR for now She needs adequate pain control GI will follow closely with you      Leonard Feigel PA-C 05/18/2024, 3:33 PM

## 2024-05-19 DIAGNOSIS — K859 Acute pancreatitis without necrosis or infection, unspecified: Secondary | ICD-10-CM | POA: Diagnosis not present

## 2024-05-19 DIAGNOSIS — J90 Pleural effusion, not elsewhere classified: Secondary | ICD-10-CM | POA: Diagnosis not present

## 2024-05-19 DIAGNOSIS — Z716 Tobacco abuse counseling: Secondary | ICD-10-CM | POA: Diagnosis not present

## 2024-05-19 DIAGNOSIS — K8689 Other specified diseases of pancreas: Secondary | ICD-10-CM | POA: Diagnosis not present

## 2024-05-19 DIAGNOSIS — D72829 Elevated white blood cell count, unspecified: Secondary | ICD-10-CM | POA: Diagnosis not present

## 2024-05-19 DIAGNOSIS — K8500 Idiopathic acute pancreatitis without necrosis or infection: Secondary | ICD-10-CM | POA: Diagnosis not present

## 2024-05-19 LAB — COMPREHENSIVE METABOLIC PANEL WITH GFR
ALT: 8 U/L (ref 0–44)
AST: 7 U/L — ABNORMAL LOW (ref 15–41)
Albumin: 2.1 g/dL — ABNORMAL LOW (ref 3.5–5.0)
Alkaline Phosphatase: 65 U/L (ref 38–126)
Anion gap: 8 (ref 5–15)
BUN: 12 mg/dL (ref 6–20)
CO2: 22 mmol/L (ref 22–32)
Calcium: 8.3 mg/dL — ABNORMAL LOW (ref 8.9–10.3)
Chloride: 103 mmol/L (ref 98–111)
Creatinine, Ser: 0.79 mg/dL (ref 0.44–1.00)
GFR, Estimated: >60 mL/min (ref >60)
Glucose, Bld: 118 mg/dL — ABNORMAL HIGH (ref 70–99)
Potassium: 3.9 mmol/L (ref 3.5–5.1)
Sodium: 133 mmol/L — ABNORMAL LOW (ref 135–145)
Total Bilirubin: 0.8 mg/dL (ref 0.0–1.2)
Total Protein: 5.6 g/dL — ABNORMAL LOW (ref 6.5–8.1)

## 2024-05-19 LAB — CBC WITH DIFFERENTIAL/PLATELET
Abs Immature Granulocytes: 0.05 K/uL (ref 0.00–0.07)
Basophils Absolute: 0 K/uL (ref 0.0–0.1)
Basophils Relative: 0 %
Eosinophils Absolute: 0.1 K/uL (ref 0.0–0.5)
Eosinophils Relative: 1 %
HCT: 38.2 % (ref 36.0–46.0)
Hemoglobin: 11.9 g/dL — ABNORMAL LOW (ref 12.0–15.0)
Immature Granulocytes: 0 %
Lymphocytes Relative: 14 %
Lymphs Abs: 1.8 K/uL (ref 0.7–4.0)
MCH: 25.1 pg — ABNORMAL LOW (ref 26.0–34.0)
MCHC: 31.2 g/dL (ref 30.0–36.0)
MCV: 80.6 fL (ref 80.0–100.0)
Monocytes Absolute: 0.8 K/uL (ref 0.1–1.0)
Monocytes Relative: 6 %
Neutro Abs: 9.8 K/uL — ABNORMAL HIGH (ref 1.7–7.7)
Neutrophils Relative %: 79 %
Platelets: 504 K/uL — ABNORMAL HIGH (ref 150–400)
RBC: 4.74 MIL/uL (ref 3.87–5.11)
RDW: 14.3 % (ref 11.5–15.5)
WBC: 12.5 K/uL — ABNORMAL HIGH (ref 4.0–10.5)
nRBC: 0 % (ref 0.0–0.2)

## 2024-05-19 LAB — GLUCOSE, CAPILLARY
Glucose-Capillary: 102 mg/dL — ABNORMAL HIGH (ref 70–99)
Glucose-Capillary: 105 mg/dL — ABNORMAL HIGH (ref 70–99)
Glucose-Capillary: 107 mg/dL — ABNORMAL HIGH (ref 70–99)
Glucose-Capillary: 114 mg/dL — ABNORMAL HIGH (ref 70–99)
Glucose-Capillary: 121 mg/dL — ABNORMAL HIGH (ref 70–99)
Glucose-Capillary: 97 mg/dL (ref 70–99)
Glucose-Capillary: 98 mg/dL (ref 70–99)

## 2024-05-19 LAB — HEMOGLOBIN AND HEMATOCRIT, BLOOD
HCT: 36.6 % (ref 36.0–46.0)
Hemoglobin: 11.8 g/dL — ABNORMAL LOW (ref 12.0–15.0)

## 2024-05-19 LAB — MAGNESIUM: Magnesium: 1.8 mg/dL (ref 1.7–2.4)

## 2024-05-19 LAB — LIPASE, BLOOD: Lipase: 79 U/L — ABNORMAL HIGH (ref 11–51)

## 2024-05-19 MED ORDER — HYDROMORPHONE HCL 1 MG/ML IJ SOLN
1.0000 mg | INTRAMUSCULAR | Status: DC | PRN
  Administered 2024-05-19 – 2024-05-21 (×9): 1 mg via INTRAVENOUS
  Filled 2024-05-19 (×9): qty 1

## 2024-05-19 NOTE — Progress Notes (Signed)
 Gastroenterology Inpatient Follow Up    Subjective: Patient had significant abdominal pain after her IR aspiration yesterday.  This abdominal pain is slightly better today but still present.  She feels that this pain is different from the pancreatitis pain that she presented with.  Objective: Vital signs in last 24 hours: Temp:  [97.5 F (36.4 C)-98.8 F (37.1 C)] 98 F (36.7 C) (07/04 0811) Pulse Rate:  [103-140] 103 (07/04 0811) Resp:  [18-19] 18 (07/04 0811) BP: (121-147)/(81-99) 147/99 (07/04 0811) SpO2:  [91 %-98 %] 93 % (07/04 0811) Last BM Date : 05/18/24  Intake/Output from previous day: 07/03 0701 - 07/04 0700 In: 1804 [P.O.:200; I.V.:1070.6; IV Piggyback:533.4] Out: 0  Intake/Output this shift: No intake/output data recorded.  General appearance: alert and cooperative Resp: no increased WOB Cardio: regular rate GI: mildly tachycardic  Lab Results: Recent Labs    05/18/24 0329 05/18/24 1644 05/19/24 0428  WBC 13.0* 15.6* 12.5*  HGB 11.3* 12.6 11.9*  HCT 36.2 40.7 38.2  PLT 484* 619* 504*   BMET Recent Labs    05/17/24 1440 05/18/24 0329 05/19/24 0428  NA 136 134* 133*  K 3.3* 3.5 3.9  CL 100 101 103  CO2 23 24 22   GLUCOSE 105* 106* 118*  BUN 7 7 12   CREATININE 0.74 0.74 0.79  CALCIUM 9.5 8.5* 8.3*   LFT Recent Labs    05/19/24 0428  PROT 5.6*  ALBUMIN 2.1*  AST 7*  ALT 8  ALKPHOS 65  BILITOT 0.8   PT/INR Recent Labs    05/18/24 0823  LABPROT 15.8*  INR 1.2   Hepatitis Panel No results for input(s): HEPBSAG, HCVAB, HEPAIGM, HEPBIGM in the last 72 hours. C-Diff No results for input(s): CDIFFTOX in the last 72 hours.  Studies/Results: DG Abd 2 Views Result Date: 05/18/2024 CLINICAL DATA:  [id number] Acute generalized abdominal pain [id number] EXAM: ABDOMEN - 2 VIEW COMPARISON:  May 17, 2024, April 19, 2024 FINDINGS: The study is degraded by patient's body habitus. Nonobstructive bowel gas pattern. No pneumoperitoneum. No  organomegaly or radiopaque calculi. No acute fracture or destructive lesion. Elevation of the left hemidiaphragm again partially visualized. The right lung bases otherwise clear.Cholecystectomy clips and intrauterine device overlying the pelvis. Multilevel degenerative disc disease of the spine. IMPRESSION: Nonobstructive bowel gas pattern. Electronically Signed   By: Rogelia Myers M.D.   On: 05/18/2024 17:48   CT GUIDED NEEDLE PLACEMENT Result Date: 05/18/2024 INDICATION: [id number] Pancreatic pseudocyst [id number]. Diagnostic aspiration requested. EXAM: CT-GUIDED PERIPANCREATIC FLUID COLLECTION ASPIRATION COMPARISON:  CT CAP, 05/17/2024.  MR ABDOMEN, 05/17/2024. MEDICATIONS: The patient is currently admitted to the hospital and receiving intravenous antibiotics. The antibiotics were administered within an appropriate time frame prior to the initiation of the procedure. ANESTHESIA/SEDATION: Local anesthetic was administered. CONTRAST:  None FLUOROSCOPY TIME:  CT dose; 977 mGycm COMPLICATIONS: None immediate. PROCEDURE: RADIATION DOSE REDUCTION: This exam was performed according to the departmental dose-optimization program which includes automated exposure control, adjustment of the mA and/or kV according to patient size and/or use of iterative reconstruction technique. Informed written consent was obtained from the patient after a discussion of the risks, benefits and alternatives to treatment. The patient was placed supine on the CT gantry and a pre procedural CT was performed re-demonstrating the known fluid collection within the LEFT upper quadrant. The procedure was planned. A timeout was performed prior to the initiation of the procedure. The LEFT upper quadrant was prepped and draped in the usual sterile fashion. The overlying soft tissues  were anesthetized with 1% lidocaine  with epinephrine . Appropriate trajectory was planned with the use of a 22 gauge spinal needle. An 18 gauge trocar needle was advanced into  the collection and appropriate positioning was confirmed with a limited CT scan. 100 mL of tan brown fluid was aspirated. The needle was removed then a dressing was placed. The patient tolerated the procedure well without immediate post procedural complication. IMPRESSION: Successful CT-guided diagnostic aspiration of a peripancreatic fluid collection yielding 100 mL of 10 brown fluid. Samples were sent to the laboratory as requested by the ordering clinical team. Thom Hall, MD [facility] Electronically Signed   By: Thom Hall M.D.   On: 05/18/2024 17:14   MR ABDOMEN MRCP W WO CONTAST Result Date: 05/18/2024 CLINICAL DATA:  ACUTE PANCREATITIS. EXAM: MRI ABDOMEN WITHOUT AND WITH CONTRAST (INCLUDING MRCP) TECHNIQUE: Multiplanar multisequence MR imaging of the abdomen was performed both before and after the administration of intravenous contrast. Heavily T2-weighted images of the biliary and pancreatic ducts were obtained, and three-dimensional MRCP images were rendered by post processing. CONTRAST:  10mL GADAVIST  GADOBUTROL  1 MMOL/ML IV SOLN COMPARISON:  CT SCAN 05/17/2024 FINDINGS: Lower chest: Left base atelectasis with small left pleural effusion. Hepatobiliary: No focal abnormality identified in the liver. No substantial intrahepatic biliary duct dilatation. Gallbladder surgically absent. Common duct measures 11 mm in the hepatoduodenal ligament. Common bile duct measures 6 mm diameter just proximal to the ampulla. Pancreas: Relatively unremarkable appearance of the pancreatic head. Pancreatic parenchyma appears to enhance throughout with no definite MR features of pancreatic necrosis. There is edema in the body and tail of pancreas. No main duct dilatation. As seen on the recent CT there is a large complex cystic process in the left upper quadrant involving the wall of the proximal stomach, pancreatic tail, subdiaphragmatic space, and dome of the  spleen. Spleen: Irregularity of the splenic capsule in the posterior dome of the spleen suggest disruption by the complex cystic process. Adrenals/Urinary Tract: No adrenal nodule or mass. Kidneys unremarkable. Stomach/Bowel: Complex fluid dissects in the wall of the proximal stomach. Duodenum unremarkable. No small bowel or colonic dilatation within the visualized abdomen. Vascular/Lymphatic: Portal vein and superior mesenteric vein are patent. 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 Celiac axis and SMA are patent. Splenic artery appears to be patent. Upper normal lymph nodes in the hepatoduodenal ligament. Other: Irregular somewhat tubular rimr enhancing fluid collection in left paracolic gutter measures on the order of 10.4 x 2.5 x 4.1 cm Musculoskeletal: No focal suspicious marrow enhancement within the visualized bony anatomy. Advanced degenerative changes are seen at the level of the L3-4 disc space. IMPRESSION: 1. Large complex cystic process in the left upper quadrant involving the wall of the proximal stomach, pancreatic tail, subdiaphragmatic space, and dome of the spleen. Imaging features are compatible with sequelae of pancreatitis with pseudocyst formation. Dominant collection in the subdiaphragmatic space measures on the order of 12.5 x 12.7 x 7.9 cm. Superinfection not excluded by imaging. No definite MR features of pancreatic necrosis. 2. Status post cholecystectomy with mild dilatation of the common duct in the hepatoduodenal ligament, likely related to postcholecystectomy state. Common bile duct nondilated just proximal to the ampulla. No choledocholithiasis. 3. Irregular somewhat tubular rim enhancing fluid collection in the left paracolic gutter measures on the order of 10.4 x 2.5 x 4.1 cm. Imaging features are compatible with pseudocyst. Superinfection not excluded by imaging. 4. 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  5. Left base atelectasis with small left pleural  effusion. Electronically Signed   By: Camellia Candle M.D.   On: 05/18/2024 05:32   MR 3D Recon At Scanner Result Date: 05/18/2024 CLINICAL DATA:  ACUTE PANCREATITIS. EXAM: MRI ABDOMEN WITHOUT AND WITH CONTRAST (INCLUDING MRCP) TECHNIQUE: Multiplanar multisequence MR imaging of the abdomen was performed both before and after the administration of intravenous contrast. Heavily T2-weighted images of the biliary and pancreatic ducts were obtained, and three-dimensional MRCP images were rendered by post processing. CONTRAST:  10mL GADAVIST  GADOBUTROL  1 MMOL/ML IV SOLN COMPARISON:  CT SCAN 05/17/2024 FINDINGS: Lower chest: Left base atelectasis with small left pleural effusion. Hepatobiliary: No focal abnormality identified in the liver. No substantial intrahepatic biliary duct dilatation. Gallbladder surgically absent. Common duct measures 11 mm in the hepatoduodenal ligament. Common bile duct measures 6 mm diameter just proximal to the ampulla. Pancreas: Relatively unremarkable appearance of the pancreatic head. Pancreatic parenchyma appears to enhance throughout with no definite MR features of pancreatic necrosis. There is edema in the body and tail of pancreas. No main duct dilatation. As seen on the recent CT there is a large complex cystic process in the left upper quadrant involving the wall of the proximal stomach, pancreatic tail, subdiaphragmatic space, and dome of the spleen. Spleen: Irregularity of the splenic capsule in the posterior dome of the spleen suggest disruption by the complex cystic process. Adrenals/Urinary Tract: No adrenal nodule or mass. Kidneys unremarkable. Stomach/Bowel: Complex fluid dissects in the wall of the proximal stomach. Duodenum unremarkable. No small bowel or colonic dilatation within the visualized abdomen. Vascular/Lymphatic: Portal vein and superior mesenteric vein are patent. 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 Celiac axis and SMA are patent. Splenic artery  appears to be patent. Upper normal lymph nodes in the hepatoduodenal ligament. Other: Irregular somewhat tubular rimr enhancing fluid collection in left paracolic gutter measures on the order of 10.4 x 2.5 x 4.1 cm Musculoskeletal: No focal suspicious marrow enhancement within the visualized bony anatomy. Advanced degenerative changes are seen at the level of the L3-4 disc space. IMPRESSION: 1. Large complex cystic process in the left upper quadrant involving the wall of the proximal stomach, pancreatic tail, subdiaphragmatic space, and dome of the spleen. Imaging features are compatible with sequelae of pancreatitis with pseudocyst formation. Dominant collection in the subdiaphragmatic space measures on the order of 12.5 x 12.7 x 7.9 cm. Superinfection not excluded by imaging. No definite MR features of pancreatic necrosis. 2. Status post cholecystectomy with mild dilatation of the common duct in the hepatoduodenal ligament, likely related to postcholecystectomy state. Common bile duct nondilated just proximal to the ampulla. No choledocholithiasis. 3. Irregular somewhat tubular rim enhancing fluid collection in the left paracolic gutter measures on the order of 10.4 x 2.5 x 4.1 cm. Imaging features are compatible with pseudocyst. Superinfection not excluded by imaging. 4. 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 5. Left base atelectasis with small left pleural effusion. Electronically Signed   By: Camellia Candle M.D.   On: 05/18/2024 05:32   CT CHEST ABDOMEN PELVIS W CONTRAST Result Date: 05/17/2024 CLINICAL DATA:  Sepsis EXAM: CT CHEST, ABDOMEN, AND PELVIS WITH CONTRAST TECHNIQUE: Multidetector CT imaging of the chest, abdomen and pelvis was performed following the standard protocol during bolus administration of intravenous contrast. RADIATION DOSE REDUCTION: This exam was performed according to the departmental dose-optimization program which includes automated exposure control, adjustment of the  mA and/or kV according to patient size and/or use of  iterative reconstruction technique. CONTRAST:  65mL OMNIPAQUE  IOHEXOL  350 MG/ML SOLN COMPARISON:  CT abdomen pelvis 04/25/2024 FINDINGS: CT CHEST FINDINGS Cardiovascular: Normal heart size. No significant pericardial effusion. The thoracic aorta is normal in caliber. At least single vessel atherosclerotic plaque of the thoracic aorta. Mild coronary artery calcifications. Mediastinum/Nodes: No enlarged mediastinal, hilar, or axillary lymph nodes. Thyroid gland, trachea, and esophagus demonstrate no significant findings. Lungs/Pleura: Left lower lobe atelectasis. No pulmonary nodule. No pulmonary mass. Slight interval increase of trace left pleural effusion. No pneumothorax. Musculoskeletal: No chest wall abnormality. No suspicious lytic or blastic osseous lesions. No acute displaced fracture. Multilevel mild degenerative changes of the spine. Partially visualized cervicothoracic spine demonstrates surgical hardware. CT ABDOMEN PELVIS FINDINGS Hepatobiliary: No focal liver abnormality. Status post cholecystectomy. No biliary dilatation. Pancreas: Interval increase in size of a large lobulated and multiloculated left upper quadrant cystic lesion arising from the pancreatic tail that again appears to be inseparable from the greater curvature of the stomach and the spleen. Finding demonstrates thick enhancing walls. The largest component measures 15 x 10.5 cm (from 10 x 6 cm). The cystic lesion abuts to the left hemidiaphragm and pushes the hemidiaphragm up. Slightly hazy pancreatic tail contour with associated peripancreatic fat stranding. No main pancreatic ductal dilatation. Spleen: Interval concave scalloping of the splenic capsule with suggestion of invasion of the left upper quadrant cystic lesion. Interval development of adjacent punctate hypodensity (5:50). Spleen is normal in size. Adrenals/Urinary Tract: No adrenal nodule bilaterally. Bilateral kidneys  enhance symmetrically. No hydronephrosis. No hydroureter. The urinary bladder is unremarkable. Stomach/Bowel: Stomach is within normal limits. No evidence of bowel wall thickening or dilatation. Vague fat stranding along the splenic flexure likely reactive changes with left upper quadrant abscess adjacent but not be inseparable (5:92). Appendix appears normal. Vascular/Lymphatic: No abdominal aorta or iliac aneurysm. Mild atherosclerotic plaque of the aorta and its branches. No abdominal, pelvic, or inguinal lymphadenopathy. Reproductive: T-shaped intrauterine device in appropriate position. Uterus and bilateral adnexa are unremarkable. Other: No intraperitoneal free fluid. No intraperitoneal free gas. No organized fluid collection. Musculoskeletal: No abdominal wall hernia or abnormality. No suspicious lytic or blastic osseous lesions. No acute displaced fracture. Multilevel degenerative changes of the spine. Stable grade 2 anterolisthesis of L3 on L4 with endplate sclerosis and intervertebral disc space vacuum phenomenon. IMPRESSION: 1. Persistent acute pancreatitis with interval increase in size of a (up to 15 x 10.5 cm on axial imaging) pancreatic tail abscess that extends along the left upper quadrant is inseparable from the greater curvature of the stomach, spleen, inferior aspect of the left hemidiaphragm. Findings suggestive of invasion into the splenic capsule with possible adjacent second abscess forming within the splenic parenchyma-too small to characterize, recommend attention on follow-up. When the patient is clinically stable and able to follow directions and hold their breath (preferably as an outpatient) further evaluation with dedicated abdominal MRI pancreatic protocol should be considered. 2. Slight interval increase of trace left pleural effusion. 3. Persistent elevated left hemidiaphragm with left base atelectasis. 4.  Aortic Atherosclerosis (ICD10-I70.0). 5. shaped intrauterine device in  appropriate position. 6. 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 Electronically Signed   By: Morgane  Naveau M.D.   On: 05/17/2024 17:16    Medications: I have reviewed the patient's current medications. Scheduled:  insulin  aspart  0-6 Units Subcutaneous Q4H   nicotine   14 mg Transdermal Daily   pantoprazole  (PROTONIX ) IV  40 mg Intravenous Q24H   sodium chloride  flush  3 mL Intravenous Q12H   Continuous:  ceFEPime  (MAXIPIME ) IV 2 g (05/19/24 1159)   lactated ringers  150 mL/hr at 05/19/24 0309   metronidazole  500 mg (05/19/24 0815)   PRN:acetaminophen , albuterol , HYDROmorphone  (DILAUDID ) injection, labetalol , melatonin, nicotine  polacrilex, ondansetron  (ZOFRAN ) IV, oxyCODONE  **OR** oxyCODONE , polyethylene glycol  Assessment/Plan: 38 year old female with recent diagnosis of pancreatitis in early May with readmission later that month, was also treatment for pneumonia and then subsequent admission here from June 1 through 11 with 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  Course was complicated by pleural effusion for which she received a chest tube.   Readmitted with worsening abdominal pain.  Leukocytosis of 17.3 LFTs normal, lipase elevated, tachycardic.  CT scan on admission showed persistent acute pancreatitis with interval increase size of pancreatic tail fluid collection/question abscess, with findings of invasion into the splenic capsule with adjacent second fluid collection.  MRCP was done which shows a dominant fluid collection of 12.7 cm, suspected pseudocyst.  Also with a smaller fluid collection 10.4 cm in the left paracolic gutter.  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  Underwent aspiration of peripancreatic fluid collection yesterday with IR that yielded 100 mL of brown fluid.  Fluid studies thus far do not show any clear signs of infection.  Patient did have some  postprocedural pain but this appears to be improving over time.  Her lab today are more consistent with improving symptoms.  WBC is decreasing and hemoglobin has remained relatively stable.  Lipase is normalizing.  LFTs have downtrended slightly.   Will continue supportive treatment with IV antibiotics and IV fluids.  Can plan to discuss the idea of cystogastrostomy on Monday with Dr. Wilhelmenia.  Patient will be on clear liquid diet today but can advance as tolerated.  If patient is not able to tolerate p.o. intake, may need to consider postpyloric tube feeding.   LOS: 2 days   Rosario JAYSON Kidney 05/19/2024, 2:06 PM

## 2024-05-19 NOTE — Plan of Care (Signed)

## 2024-05-19 NOTE — Progress Notes (Signed)
 PROGRESS NOTE  Katrina Lambert FMW:[id number] DOB: Apr 01, 1986   PCP: [facility]  Patient is from: Home.  DOA: 05/17/2024 LOS: 2  Chief complaints Chief Complaint  Patient presents with   Abdominal Pain     Brief Narrative / Interim history: 38 year old F with PMH of DM-2, HTN, asthma, IDA, morbid obesity, B12 deficiency, cervical myelopathy, tobacco use disorder and recent hospitalization at [facility] from 5/3-5/6 for idiopathic pancreatitis and at [facility] 5/31/611 for recurrent pancreatitis with pseudocyst as well as hospital-acquired pneumonia complicated by parapneumonic effusion requiring chest tube placement returning to ED with worsening intermittent abdominal pain, nausea and vomiting since her discharge from the hospital.  Symptoms gotten more persistent and she presented to ED for evaluation.  In ED, slightly tachycardic with leukocytosis to 17.  Lactic acid 0.8 and 2.1.  Lipase was 960.  CT chest, abdomen and pelvis with interval increase of pancreatic tail fluid collection extending along the LUQ inseparable from the greater curvature of the stomach, spleen, inferior aspect of left hemidiaphragm concerning for splenic capsular invasion with possible adjacent second abscess forming within the splenic parenchyma.  GI and IR consulted.  Started on cefepime  and Flagyl .  MRI ordered.  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   Patient had CT-guided diagnostic aspiration of peripancreatic fluid collection yielding 100 cc of brown fluid.  Fluid culture pending.  Remains on cefepime  and Flagyl .  Subjective: Seen and examined earlier this morning.  No major events overnight or this morning.  Reports improvement in abdominal pain.  Denies nausea or vomiting.  Having bowel movements.  Denies melena or hematochezia.  Objective: Vitals:   05/18/24 1632  05/18/24 2110 05/19/24 0445 05/19/24 0811  BP: 121/81 (!) 140/93 (!) 137/91 (!) 147/99  Pulse: (!) 125 (!) 114 (!) 105 (!) 103  Resp: 18 18 18 18   Temp: 98.4 F (36.9 C) 98.8 F (37.1 C) 98.3 F (36.8 C) 98 F (36.7 C)  TempSrc:      SpO2: 94% 91% 92% 93%  Weight:      Height:        Examination:  GENERAL: No apparent distress.  Nontoxic. HEENT: MMM.  Vision and hearing grossly intact.  NECK: Supple.  No apparent JVD.  RESP:  No IWOB.  Fair aeration bilaterally. CVS:  RRR. Heart sounds normal.  ABD/GI/GU: BS+. Abd soft.  Mild diffuse tenderness across upper abdomen mainly in LUQ. MSK/EXT:  Moves extremities. No apparent deformity. No edema.  SKIN: no apparent skin lesion or wound NEURO: AA.  Oriented appropriately.  No apparent focal neuro deficit. PSYCH: Calm. Normal affect.   Consultants:  Gastroenterology Interventional radiology  Procedures: 7/3-CT-guided diagnostic aspiration of peripancreatic fluid collection yielding 100 cc of brown fluid  Microbiology summarized: Single set blood culture NGTD Fluid culture pending  Assessment and plan: Acute on chronic pancreatitis with enlarging pancreatic pseudocyst SIRS with tachycardia and leukocytosis: HDS.  Leukocytosis improved. Lactic acidosis: Mild and resolved. -Single set blood culture NGTD. -S/p CT-guided diagnostic aspiration of peripancreatic fluid collection yielding 100 cc of brown fluid -Follow-up fluid culture. -Continue IV cefepime  and Flagyl . -Pain control, IV fluid, antiemetics and PPI -GI on board.  For evaluation for cystogastrostomy next week   NIDDM-2 with hyperglycemia: A1c 7.3%.  Does not seem to be medication at home. Recent Labs  Lab 05/18/24 2108 05/19/24 0027 05/19/24 0443 05/19/24 0812 05/19/24 1146  GLUCAP 100* 102* 114* 121* 105*  -SSI very sensitive every 4 hours  Hypokalemia/hypomagnesemia -Monitor replenish K and Mg as appropriate  Tobacco use disorder -Encourage smoking  cessation -Nicotine  patch  Essential hypertension: BP elevated probably due to pain and IV fluid. -Pain control -IV labetalol  as needed -Will start oral regimen once able to take p.o.   Asthma: Albuterol  as needed IDA: Stable.  Continue monitoring. B12: Continue B12 when able to take p.o. Cervical myelopathy: Noted PCOS: On metformin  outpatient? Mood disorder: Stable.  Not on medication  Class II obesity Body mass index is 39.95 kg/m.           DVT prophylaxis:  SCDs Start: 05/17/24 2001  Code Status: Full code Family Communication: None at bedside Level of care: Telemetry Medical Status is: Inpatient Remains inpatient appropriate because: Acute on chronic pancreatitis with pseudocyst and possible infection   Final disposition: Likely home once medically stable   55 minutes with more than 50% spent in reviewing records, counseling patient/family and coordinating care.   Sch Meds:  Scheduled Meds:  insulin  aspart  0-6 Units Subcutaneous Q4H   nicotine   14 mg Transdermal Daily   pantoprazole  (PROTONIX ) IV  40 mg Intravenous Q24H   sodium chloride  flush  3 mL Intravenous Q12H   Continuous Infusions:  ceFEPime  (MAXIPIME ) IV 2 g (05/19/24 1159)   lactated ringers  150 mL/hr at 05/19/24 0309   metronidazole  500 mg (05/19/24 0815)   PRN Meds:.acetaminophen , albuterol , HYDROmorphone  (DILAUDID ) injection, labetalol , melatonin, nicotine  polacrilex, ondansetron  (ZOFRAN ) IV, oxyCODONE  **OR** oxyCODONE , polyethylene glycol  Antimicrobials: Anti-infectives (From admission, onward)    Start     Dose/Rate Route Frequency Ordered Stop   05/18/24 0730  metroNIDAZOLE  (FLAGYL ) IVPB 500 mg        500 mg 100 mL/hr over 60 Minutes Intravenous Every 12 hours 05/17/24 2053     05/18/24 0330  ceFEPIme  (MAXIPIME ) 2 g in sodium chloride  0.9 % 100 mL IVPB        2 g 200 mL/hr over 30 Minutes Intravenous Every 8 hours 05/17/24 2053     05/17/24 1930  ceFEPIme  (MAXIPIME ) 2 g in sodium  chloride 0.9 % 100 mL IVPB        2 g 200 mL/hr over 30 Minutes Intravenous  Once 05/17/24 1928 05/17/24 2022   05/17/24 1930  metroNIDAZOLE  (FLAGYL ) IVPB 500 mg        500 mg 100 mL/hr over 60 Minutes Intravenous  Once 05/17/24 1928 05/17/24 2059        I have personally reviewed the following labs and images: CBC: Recent Labs  Lab 05/17/24 1440 05/18/24 0329 05/18/24 1644 05/19/24 0428  WBC 17.3* 13.0* 15.6* 12.5*  NEUTROABS  --   --  13.4* 9.8*  HGB 12.8 11.3* 12.6 11.9*  HCT 40.0 36.2 40.7 38.2  MCV 80.2 80.6 82.2 80.6  PLT 533* 484* 619* 504*   BMP &GFR Recent Labs  Lab 05/17/24 1440 05/18/24 0329 05/19/24 0428  NA 136 134* 133*  K 3.3* 3.5 3.9  CL 100 101 103  CO2 23 24 22   GLUCOSE 105* 106* 118*  BUN 7 7 12   CREATININE 0.74 0.74 0.79  CALCIUM 9.5 8.5* 8.3*  MG  --  1.6* 1.8  PHOS  --  4.6  --    Estimated Creatinine Clearance: 118.3 mL/min (by C-G formula based on SCr of 0.79 mg/dL). Liver & Pancreas: Recent Labs  Lab 05/17/24 1440 05/19/24 0428  AST 15 7*  ALT 13  8  ALKPHOS 80 65  BILITOT 0.8 0.8  PROT 7.2 5.6*  ALBUMIN 2.8* 2.1*   Recent Labs  Lab 05/17/24 1440 05/19/24 0428  LIPASE 940* 79*   No results for input(s): AMMONIA in the last 168 hours. Diabetic: No results for input(s): HGBA1C in the last 72 hours. Recent Labs  Lab 05/18/24 2108 05/19/24 0027 05/19/24 0443 05/19/24 0812 05/19/24 1146  GLUCAP 100* 102* 114* 121* 105*   Cardiac Enzymes: No results for input(s): CKTOTAL, CKMB, CKMBINDEX, TROPONINI in the last 168 hours. No results for input(s): PROBNP in the last 8760 hours. Coagulation Profile: Recent Labs  Lab 05/18/24 0823  INR 1.2   Thyroid Function Tests: No results for input(s): TSH, T4TOTAL, FREET4, T3FREE, THYROIDAB in the last 72 hours. Lipid Profile: No results for input(s): CHOL, HDL, LDLCALC, TRIG, CHOLHDL, LDLDIRECT in the last 72 hours. Anemia Panel: No results  for input(s): VITAMINB12, FOLATE, FERRITIN, TIBC, IRON , RETICCTPCT in the last 72 hours. Urine analysis:    Component Value Date/Time   COLORURINE YELLOW 05/17/2024 1302   APPEARANCEUR CLEAR 05/17/2024 1302   LABSPEC 1.011 05/17/2024 1302   PHURINE 6.0 05/17/2024 1302   GLUCOSEU NEGATIVE 05/17/2024 1302   HGBUR SMALL (A) 05/17/2024 1302   BILIRUBINUR NEGATIVE 05/17/2024 1302   KETONESUR NEGATIVE 05/17/2024 1302   PROTEINUR 30 (A) 05/17/2024 1302   UROBILINOGEN 0.2 12/20/2009 2301   NITRITE NEGATIVE 05/17/2024 1302   LEUKOCYTESUR NEGATIVE 05/17/2024 1302   Sepsis Labs: Invalid input(s): PROCALCITONIN, LACTICIDVEN  Microbiology: Recent Results (from the past 240 hours)  Culture, blood (single)     Status: None (Preliminary result)   Collection Time: 05/17/24  7:45 PM   Specimen: BLOOD  Result Value Ref Range Status   Specimen Description BLOOD SITE NOT SPECIFIED  Final   Special Requests   Final    BOTTLES DRAWN AEROBIC AND ANAEROBIC Blood Culture results may not be optimal due to an inadequate volume of blood received in culture bottles   Culture   Final    NO GROWTH 2 DAYS Performed at [facility], [address], [place], [place] [address]    Report Status PENDING  Incomplete  Body fluid culture w Gram Stain     Status: None (Preliminary result)   Collection Time: 05/18/24  1:10 PM   Specimen: Body Fluid  Result Value Ref Range Status   Specimen Description FLUID  Final   Special Requests PERI PANCREATIC  Final   Gram Stain NO WBC SEEN NO ORGANISMS SEEN   Final   Culture   Final    NO GROWTH < 24 HOURS Performed at [facility], [address] [place], [place] [address]    Report Status PENDING  Incomplete  Anaerobic culture w Gram Stain     Status: None (Preliminary result)   Collection Time: 05/18/24  1:10 PM   Specimen: Fluid  Result Value Ref Range Status   Specimen Description FLUID  Final   Special Requests PERI PANCREATIC   Final   Gram Stain   Final    NO WBC SEEN NO ORGANISMS SEEN Performed at [facility], [address], [place], [place] [address]    Culture PENDING  Incomplete   Report Status PENDING  Incomplete    Radiology Studies: DG Abd 2 Views Result Date: 05/18/2024 CLINICAL DATA:  [id number] Acute generalized abdominal pain [id number] EXAM: ABDOMEN - 2 VIEW COMPARISON:  May 17, 2024, April 19, 2024 FINDINGS: The study is degraded  by patient's body habitus. Nonobstructive bowel gas pattern. No pneumoperitoneum. No organomegaly or radiopaque calculi. No acute fracture or destructive lesion. Elevation of the left hemidiaphragm again partially visualized. The right lung bases otherwise clear.Cholecystectomy clips and intrauterine device overlying the pelvis. Multilevel degenerative disc disease of the spine. IMPRESSION: Nonobstructive bowel gas pattern. Electronically Signed   By: Rogelia Myers M.D.   On: 05/18/2024 17:48   CT GUIDED NEEDLE PLACEMENT Result Date: 05/18/2024 INDICATION: [id number] Pancreatic pseudocyst [id number]. Diagnostic aspiration requested. EXAM: CT-GUIDED PERIPANCREATIC FLUID COLLECTION ASPIRATION COMPARISON:  CT CAP, 05/17/2024.  MR ABDOMEN, 05/17/2024. MEDICATIONS: The patient is currently admitted to the hospital and receiving intravenous antibiotics. The antibiotics were administered within an appropriate time frame prior to the initiation of the procedure. ANESTHESIA/SEDATION: Local anesthetic was administered. CONTRAST:  None FLUOROSCOPY TIME:  CT dose; 977 mGycm COMPLICATIONS: None immediate. PROCEDURE: RADIATION DOSE REDUCTION: This exam was performed according to the departmental dose-optimization program which includes automated exposure control, adjustment of the mA and/or kV according to patient size and/or use of iterative reconstruction technique. Informed written consent was obtained from the patient after a discussion of the risks, benefits and alternatives to treatment. The patient was  placed supine on the CT gantry and a pre procedural CT was performed re-demonstrating the known fluid collection within the LEFT upper quadrant. The procedure was planned. A timeout was performed prior to the initiation of the procedure. The LEFT upper quadrant was prepped and draped in the usual sterile fashion. The overlying soft tissues were anesthetized with 1% lidocaine  with epinephrine . Appropriate trajectory was planned with the use of a 22 gauge spinal needle. An 18 gauge trocar needle was advanced into the collection and appropriate positioning was confirmed with a limited CT scan. 100 mL of tan brown fluid was aspirated. The needle was removed then a dressing was placed. The patient tolerated the procedure well without immediate post procedural complication. IMPRESSION: Successful CT-guided diagnostic aspiration of a peripancreatic fluid collection yielding 100 mL of 10 brown fluid. Samples were sent to the laboratory as requested by the ordering clinical team. Thom Hall, MD [facility] Radiology Electronically Signed   By: Thom Hall M.D.   On: 05/18/2024 17:14      Mirian Casco T. Keenya [employer] [facility]  If 7PM-7AM, please contact night-coverage [email or web address] 05/19/2024, 12:35 PM

## 2024-05-20 DIAGNOSIS — K859 Acute pancreatitis without necrosis or infection, unspecified: Secondary | ICD-10-CM | POA: Diagnosis not present

## 2024-05-20 DIAGNOSIS — K862 Cyst of pancreas: Secondary | ICD-10-CM | POA: Diagnosis not present

## 2024-05-20 DIAGNOSIS — D72829 Elevated white blood cell count, unspecified: Secondary | ICD-10-CM | POA: Diagnosis not present

## 2024-05-20 LAB — COMPREHENSIVE METABOLIC PANEL WITH GFR
ALT: 5 U/L (ref 0–44)
AST: 9 U/L — ABNORMAL LOW (ref 15–41)
Albumin: 1.9 g/dL — ABNORMAL LOW (ref 3.5–5.0)
Alkaline Phosphatase: 56 U/L (ref 38–126)
Anion gap: 9 (ref 5–15)
BUN: 14 mg/dL (ref 6–20)
CO2: 20 mmol/L — ABNORMAL LOW (ref 22–32)
Calcium: 8.1 mg/dL — ABNORMAL LOW (ref 8.9–10.3)
Chloride: 105 mmol/L (ref 98–111)
Creatinine, Ser: 0.69 mg/dL (ref 0.44–1.00)
GFR, Estimated: >60 mL/min (ref >60)
Glucose, Bld: 101 mg/dL — ABNORMAL HIGH (ref 70–99)
Potassium: 3.8 mmol/L (ref 3.5–5.1)
Sodium: 134 mmol/L — ABNORMAL LOW (ref 135–145)
Total Bilirubin: 0.2 mg/dL (ref 0.0–1.2)
Total Protein: 5.4 g/dL — ABNORMAL LOW (ref 6.5–8.1)

## 2024-05-20 LAB — CBC
HCT: 33.5 % — ABNORMAL LOW (ref 36.0–46.0)
Hemoglobin: 10.7 g/dL — ABNORMAL LOW (ref 12.0–15.0)
MCH: 26.2 pg (ref 26.0–34.0)
MCHC: 31.9 g/dL (ref 30.0–36.0)
MCV: 82.1 fL (ref 80.0–100.0)
Platelets: 433 K/uL — ABNORMAL HIGH (ref 150–400)
RBC: 4.08 MIL/uL (ref 3.87–5.11)
RDW: 14.5 % (ref 11.5–15.5)
WBC: 12.9 K/uL — ABNORMAL HIGH (ref 4.0–10.5)
nRBC: 0 % (ref 0.0–0.2)

## 2024-05-20 LAB — GLUCOSE, CAPILLARY
Glucose-Capillary: 103 mg/dL — ABNORMAL HIGH (ref 70–99)
Glucose-Capillary: 121 mg/dL — ABNORMAL HIGH (ref 70–99)
Glucose-Capillary: 72 mg/dL (ref 70–99)
Glucose-Capillary: 83 mg/dL (ref 70–99)
Glucose-Capillary: 92 mg/dL (ref 70–99)
Glucose-Capillary: 95 mg/dL (ref 70–99)

## 2024-05-20 LAB — LIPASE, BLOOD: Lipase: 79 U/L — ABNORMAL HIGH (ref 11–51)

## 2024-05-20 LAB — CEA: CEA: 1.5 ng/mL (ref 0.0–4.7)

## 2024-05-20 LAB — HEMOGLOBIN AND HEMATOCRIT, BLOOD
HCT: 33.7 % — ABNORMAL LOW (ref 36.0–46.0)
Hemoglobin: 10.5 g/dL — ABNORMAL LOW (ref 12.0–15.0)

## 2024-05-20 LAB — MAGNESIUM: Magnesium: 1.7 mg/dL (ref 1.7–2.4)

## 2024-05-20 MED ORDER — SODIUM CHLORIDE 0.9 % IV SOLN
2.0000 g | Freq: Three times a day (TID) | INTRAVENOUS | Status: DC
  Administered 2024-05-21 (×2): 2 g via INTRAVENOUS
  Filled 2024-05-20 (×2): qty 12.5

## 2024-05-20 MED ORDER — ENSURE PLUS HIGH PROTEIN PO LIQD
237.0000 mL | Freq: Two times a day (BID) | ORAL | Status: DC
  Administered 2024-05-21 – 2024-06-04 (×21): 237 mL via ORAL

## 2024-05-20 MED ORDER — METRONIDAZOLE 500 MG/100ML IV SOLN
500.0000 mg | Freq: Two times a day (BID) | INTRAVENOUS | Status: DC
  Administered 2024-05-21: 500 mg via INTRAVENOUS
  Filled 2024-05-20: qty 100

## 2024-05-20 MED ORDER — INSULIN ASPART 100 UNIT/ML IJ SOLN
0.0000 [IU] | Freq: Three times a day (TID) | INTRAMUSCULAR | Status: DC
  Administered 2024-05-22 – 2024-05-25 (×3): 1 [IU] via SUBCUTANEOUS
  Administered 2024-05-27: 2 [IU] via SUBCUTANEOUS
  Administered 2024-05-28 (×2): 1 [IU] via SUBCUTANEOUS
  Administered 2024-05-29: 2 [IU] via SUBCUTANEOUS
  Administered 2024-05-30 – 2024-06-02 (×6): 1 [IU] via SUBCUTANEOUS
  Administered 2024-06-03 (×2): 2 [IU] via SUBCUTANEOUS
  Administered 2024-06-04: 3 [IU] via SUBCUTANEOUS

## 2024-05-20 NOTE — Plan of Care (Signed)

## 2024-05-20 NOTE — Plan of Care (Signed)
  Problem: Clinical Measurements: Goal: Ability to maintain clinical measurements within normal limits will improve 05/20/2024 0323 by Delores Lucienne RAMAN, LPN Outcome: Progressing 05/20/2024 0017 by Delores Lucienne RAMAN, LPN Outcome: Progressing Goal: Will remain free from infection 05/20/2024 0323 by Delores Lucienne RAMAN, LPN Outcome: Progressing 05/20/2024 0017 by Delores Lucienne RAMAN, LPN Outcome: Progressing Goal: Diagnostic test results will improve 05/20/2024 0323 by Delores Lucienne RAMAN, LPN Outcome: Progressing 05/20/2024 0017 by Delores Lucienne RAMAN, LPN Outcome: Progressing Goal: Respiratory complications will improve 05/20/2024 0323 by Delores Lucienne RAMAN, LPN Outcome: Progressing 05/20/2024 0017 by Delores Lucienne RAMAN, LPN Outcome: Progressing Goal: Cardiovascular complication will be avoided 05/20/2024 0323 by Delores Lucienne RAMAN, LPN Outcome: Progressing 05/20/2024 0017 by Delores Lucienne RAMAN, LPN Outcome: Progressing   Problem: Activity: Goal: Risk for activity intolerance will decrease 05/20/2024 0323 by Delores Lucienne RAMAN, LPN Outcome: Progressing 05/20/2024 0017 by Delores Lucienne RAMAN, LPN Outcome: Progressing   Problem: Nutrition: Goal: Adequate nutrition will be maintained 05/20/2024 0323 by Delores Lucienne RAMAN, LPN Outcome: Progressing 05/20/2024 0017 by Delores Lucienne RAMAN, LPN Outcome: Progressing   Problem: Coping: Goal: Level of anxiety will decrease 05/20/2024 0323 by Delores Lucienne RAMAN, LPN Outcome: Progressing 05/20/2024 0017 by Delores Lucienne RAMAN, LPN Outcome: Progressing   Problem: Elimination: Goal: Will not experience complications related to bowel motility 05/20/2024 0323 by Delores Lucienne RAMAN, LPN Outcome: Progressing 05/20/2024 0017 by Delores Lucienne RAMAN, LPN Outcome: Progressing Goal: Will not experience complications related to urinary retention 05/20/2024 0323 by Delores Lucienne RAMAN, LPN Outcome: Progressing 05/20/2024 0017 by Delores Lucienne RAMAN, LPN Outcome: Progressing

## 2024-05-20 NOTE — Progress Notes (Signed)
 PROGRESS NOTE  Katrina Lambert FMW:[id number] DOB: 1986/03/29   PCP: [facility]  Patient is from: Home.  DOA: 05/17/2024 LOS: 3  Chief complaints Chief Complaint  Patient presents with   Abdominal Pain     Brief Narrative / Interim history: 38 year old F with PMH of DM-2, HTN, asthma, IDA, morbid obesity, B12 deficiency, cervical myelopathy, tobacco use disorder and recent hospitalization at [facility] from 5/3-5/6 for idiopathic pancreatitis and at [facility] 5/31/611 for recurrent pancreatitis with pseudocyst as well as hospital-acquired pneumonia complicated by parapneumonic effusion requiring chest tube placement returning to ED with worsening intermittent abdominal pain, nausea and vomiting since her discharge from the hospital.  Symptoms gotten more persistent and she presented to ED for evaluation.  In ED, slightly tachycardic with leukocytosis to 17.  Lactic acid 0.8 and 2.1.  Lipase was 960.  CT chest, abdomen and pelvis with interval increase of pancreatic tail fluid collection extending along the LUQ inseparable from the greater curvature of the stomach, spleen, inferior aspect of left hemidiaphragm concerning for splenic capsular invasion with possible adjacent second abscess forming within the splenic parenchyma.  GI and IR consulted.  Started on cefepime  and Flagyl .  MRI ordered.  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   Patient had CT-guided diagnostic aspiration of peripancreatic fluid collection yielding 100 cc of brown fluid.  Fluid culture NGTD.  Subjective: Seen and examined earlier this morning.  No major events overnight or this morning.  Continues to endorse abdominal pain.  She rates her pain 7/10.  She just received IV Dilaudid .  Reports some nausea but no emesis.  No bowel movement since yesterday morning.  Objective: Vitals:    05/19/24 1654 05/19/24 2113 05/20/24 0347 05/20/24 0727  BP: (!) 150/103 130/85 131/84 122/89  Pulse: (!) 104 97 (!) 101 96  Resp: 18 18 18    Temp: 98.4 F (36.9 C) 98 F (36.7 C) 98.1 F (36.7 C) 98.2 F (36.8 C)  TempSrc:      SpO2: 94% 98% 92% 92%  Weight:      Height:        Examination:  GENERAL: No apparent distress.  Nontoxic. HEENT: MMM.  Vision and hearing grossly intact.  NECK: Supple.  No apparent JVD.  RESP:  No IWOB.  Fair aeration bilaterally. CVS:  RRR. Heart sounds normal.  ABD/GI/GU: BS+. Abd soft.  Mild diffuse tenderness across upper abdomen mainly in LUQ. MSK/EXT:  Moves extremities. No apparent deformity. No edema.  SKIN: no apparent skin lesion or wound NEURO: AA.  Oriented appropriately.  No apparent focal neuro deficit. PSYCH: Calm. Normal affect.   Consultants:  Gastroenterology Interventional radiology  Procedures: 7/3-CT-guided diagnostic aspiration of peripancreatic fluid collection yielding 100 cc of brown fluid  Microbiology summarized: Single set blood culture NGTD Fluid culture NGTD.  Assessment and plan: Acute on chronic pancreatitis with enlarging pancreatic pseudocyst-pain and lipase improved. SIRS with tachycardia and leukocytosis: HDS.  Leukocytosis improved. Lactic acidosis: Mild and resolved. -Appreciate input by GI-continue antibiotics today, CLD, and ensure and evaluation for cystogastrostomy -Single set blood culture NGTD. -S/p CT-guided diagnostic aspiration of peripancreatic fluid yielding 100 cc of brown fluid -Follow-up cultures NGTD. -Continue IV cefepime  and Flagyl . -Pain control,, PPI and antiemetics   NIDDM-2 with hyperglycemia: A1c 7.3%.  Does not seem to be medication at home. Recent Labs  Lab 05/19/24 2015 05/19/24 2342 05/20/24 9651  05/20/24 0728 05/20/24 1127  GLUCAP 107* 98 103* 92 95  - SSI-sensitive ACHS  Hypokalemia/hypomagnesemia -Monitor replenish K and Mg as appropriate  Tobacco use  disorder -Encourage smoking cessation -Nicotine  patch  Essential hypertension: Normotensive. -Pain control -IV labetalol  as needed  Hyponatremia: Mild.  Stable.   -Monitor   Asthma: Albuterol  as needed IDA: Stable.  Continue monitoring. B12: Continue B12 when able to take p.o. Cervical myelopathy: Noted PCOS: On metformin  outpatient? Mood disorder: Stable.  Not on medication  Class II obesity Body mass index is 39.95 kg/m.           DVT prophylaxis:  SCDs Start: 05/17/24 2001  Code Status: Full code Family Communication: None at bedside Level of care: Telemetry Medical Status is: Inpatient Remains inpatient appropriate because: Acute on chronic pancreatitis with pseudocyst and possible infection   Final disposition: Likely home once medically stable   55 minutes with more than 50% spent in reviewing records, counseling patient/family and coordinating care.   Sch Meds:  Scheduled Meds:  feeding supplement  237 mL Oral BID BM   insulin  aspart  0-6 Units Subcutaneous Q4H   nicotine   14 mg Transdermal Daily   pantoprazole  (PROTONIX ) IV  40 mg Intravenous Q24H   sodium chloride  flush  3 mL Intravenous Q12H   Continuous Infusions:  ceFEPime  (MAXIPIME ) IV 2 g (05/20/24 1140)   metronidazole  500 mg (05/20/24 0829)   PRN Meds:.acetaminophen , albuterol , HYDROmorphone  (DILAUDID ) injection, labetalol , melatonin, nicotine  polacrilex, ondansetron  (ZOFRAN ) IV, oxyCODONE  **OR** oxyCODONE , polyethylene glycol  Antimicrobials: Anti-infectives (From admission, onward)    Start     Dose/Rate Route Frequency Ordered Stop   05/18/24 0730  metroNIDAZOLE  (FLAGYL ) IVPB 500 mg        500 mg 100 mL/hr over 60 Minutes Intravenous Every 12 hours 05/17/24 2053     05/18/24 0330  ceFEPIme  (MAXIPIME ) 2 g in sodium chloride  0.9 % 100 mL IVPB        2 g 200 mL/hr over 30 Minutes Intravenous Every 8 hours 05/17/24 2053     05/17/24 1930  ceFEPIme  (MAXIPIME ) 2 g in sodium chloride  0.9  % 100 mL IVPB        2 g 200 mL/hr over 30 Minutes Intravenous  Once 05/17/24 1928 05/17/24 2022   05/17/24 1930  metroNIDAZOLE  (FLAGYL ) IVPB 500 mg        500 mg 100 mL/hr over 60 Minutes Intravenous  Once 05/17/24 1928 05/17/24 2059        I have personally reviewed the following labs and images: CBC: Recent Labs  Lab 05/17/24 1440 05/18/24 0329 05/18/24 1644 05/19/24 0428 05/19/24 1641 05/20/24 0402  WBC 17.3* 13.0* 15.6* 12.5*  --  12.9*  NEUTROABS  --   --  13.4* 9.8*  --   --   HGB 12.8 11.3* 12.6 11.9* 11.8* 10.7*  HCT 40.0 36.2 40.7 38.2 36.6 33.5*  MCV 80.2 80.6 82.2 80.6  --  82.1  PLT 533* 484* 619* 504*  --  433*   BMP &GFR Recent Labs  Lab 05/17/24 1440 05/18/24 0329 05/19/24 0428 05/20/24 0402  NA 136 134* 133* 134*  K 3.3* 3.5 3.9 3.8  CL 100 101 103 105  CO2 23 24 22  20*  GLUCOSE 105* 106* 118* 101*  BUN 7 7 12 14   CREATININE 0.74 0.74 0.79 0.69  CALCIUM 9.5 8.5* 8.3* 8.1*  MG  --  1.6* 1.8 1.7  PHOS  --  4.6  --   --  Estimated Creatinine Clearance: 118.3 mL/min (by C-G formula based on SCr of 0.69 mg/dL). Liver & Pancreas: Recent Labs  Lab 05/17/24 1440 05/19/24 0428 05/20/24 0402  AST 15 7* 9*  ALT 13 8 5   ALKPHOS 80 65 56  BILITOT 0.8 0.8 0.2  PROT 7.2 5.6* 5.4*  ALBUMIN 2.8* 2.1* 1.9*   Recent Labs  Lab 05/17/24 1440 05/19/24 0428 05/20/24 0402  LIPASE 940* 79* 79*   No results for input(s): AMMONIA in the last 168 hours. Diabetic: No results for input(s): HGBA1C in the last 72 hours. Recent Labs  Lab 05/19/24 2015 05/19/24 2342 05/20/24 0348 05/20/24 0728 05/20/24 1127  GLUCAP 107* 98 103* 92 95   Cardiac Enzymes: No results for input(s): CKTOTAL, CKMB, CKMBINDEX, TROPONINI in the last 168 hours. No results for input(s): PROBNP in the last 8760 hours. Coagulation Profile: Recent Labs  Lab 05/18/24 0823  INR 1.2   Thyroid Function Tests: No results for input(s): TSH, T4TOTAL, FREET4,  T3FREE, THYROIDAB in the last 72 hours. Lipid Profile: No results for input(s): CHOL, HDL, LDLCALC, TRIG, CHOLHDL, LDLDIRECT in the last 72 hours. Anemia Panel: No results for input(s): VITAMINB12, FOLATE, FERRITIN, TIBC, IRON , RETICCTPCT in the last 72 hours. Urine analysis:    Component Value Date/Time   COLORURINE YELLOW 05/17/2024 1302   APPEARANCEUR CLEAR 05/17/2024 1302   LABSPEC 1.011 05/17/2024 1302   PHURINE 6.0 05/17/2024 1302   GLUCOSEU NEGATIVE 05/17/2024 1302   HGBUR SMALL (A) 05/17/2024 1302   BILIRUBINUR NEGATIVE 05/17/2024 1302   KETONESUR NEGATIVE 05/17/2024 1302   PROTEINUR 30 (A) 05/17/2024 1302   UROBILINOGEN 0.2 12/20/2009 2301   NITRITE NEGATIVE 05/17/2024 1302   LEUKOCYTESUR NEGATIVE 05/17/2024 1302   Sepsis Labs: Invalid input(s): PROCALCITONIN, LACTICIDVEN  Microbiology: Recent Results (from the past 240 hours)  Culture, blood (single)     Status: None (Preliminary result)   Collection Time: 05/17/24  7:45 PM   Specimen: BLOOD  Result Value Ref Range Status   Specimen Description BLOOD SITE NOT SPECIFIED  Final   Special Requests   Final    BOTTLES DRAWN AEROBIC AND ANAEROBIC Blood Culture results may not be optimal due to an inadequate volume of blood received in culture bottles   Culture   Final    NO GROWTH 3 DAYS Performed at [facility], [address] [place], [place] [address]    Report Status PENDING  Incomplete  Body fluid culture w Gram Stain     Status: None (Preliminary result)   Collection Time: 05/18/24  1:10 PM   Specimen: Body Fluid  Result Value Ref Range Status   Specimen Description FLUID  Final   Special Requests PERI PANCREATIC  Final   Gram Stain NO WBC SEEN NO ORGANISMS SEEN   Final   Culture   Final    NO GROWTH 2 DAYS Performed at [facility], [address], [place], [place] [address]    Report Status PENDING  Incomplete  Anaerobic culture w Gram Stain     Status: None  (Preliminary result)   Collection Time: 05/18/24  1:10 PM   Specimen: Fluid  Result Value Ref Range Status   Specimen Description FLUID  Final   Special Requests PERI PANCREATIC  Final   Gram Stain   Final    NO WBC SEEN NO ORGANISMS SEEN Performed at [facility], [address] [place], [place] [address]    Culture PENDING  Incomplete   Report Status  PENDING  Incomplete    Radiology Studies: No results found.     Doniven Vanpatten T. Wateen Varon [facility]  If 7PM-7AM, please contact night-coverage [email or web address] 05/20/2024, 1:10 PM

## 2024-05-20 NOTE — Progress Notes (Signed)
 Patient ID: Katrina Lambert, female   DOB: 1985-11-28, 38 y.o.   MRN: [id number]    Progress Note   Subjective   Day # 3 CC; severe pancreatitis with enlarging pseudocysts and worsening abdominal pain  IV Zosyn  Labs today-lipase 79 WBC 12.9/hemoglobin 10.7/hematocrit 33.5 Sodium 134/potassium 3.8/BUN 14/creatinine 0.69 LFTs within normal limits Blood cultures no growth  Pseudocyst fluid aspiration-Gram stain no WBCs and no organisms seen culture no growth x 2 days  Patient sitting up in bed today, says she feels much better, pain much more controlled, she is able to tolerate clear liquids no vomiting.  She does not want to advance her diet as yet  Afebrile   Objective   Vital signs in last 24 hours: Temp:  [98 F (36.7 C)-98.4 F (36.9 C)] 98.2 F (36.8 C) (07/05 0727) Pulse Rate:  [96-104] 96 (07/05 0727) Resp:  [18] 18 (07/05 0347) BP: (122-150)/(84-103) 122/89 (07/05 0727) SpO2:  [92 %-98 %] 92 % (07/05 0727) Last BM Date : 05/19/24 General: Young white female in NAD, sitting up in bed Heart:  Regular rate and rhythm; no murmurs Lungs: Respirations even and unlabored, lungs CTA bilaterally Abdomen:  Soft,, obese, tender across the upper abdomen,and nondistended.  Bowel sounds are present Extremities:  Without edema. Neurologic:  Alert and oriented,  grossly normal neurologically. Psych:  Cooperative. Normal mood and affect.  Intake/Output from previous day: 07/04 0701 - 07/05 0700 In: 170 [I.V.:20; IV Piggyback:150] Out: 600 [Urine:600] Intake/Output this shift: Total I/O In: 240 [P.O.:240] Out: -   Lab Results: Recent Labs    05/18/24 1644 05/19/24 0428 05/19/24 1641 05/20/24 0402  WBC 15.6* 12.5*  --  12.9*  HGB 12.6 11.9* 11.8* 10.7*  HCT 40.7 38.2 36.6 33.5*  PLT 619* 504*  --  433*   BMET Recent Labs    05/18/24 0329 05/19/24 0428 05/20/24 0402  NA 134* 133* 134*  K 3.5 3.9 3.8  CL 101 103 105  CO2 24 22 20*  GLUCOSE 106* 118* 101*  BUN 7  12 14   CREATININE 0.74 0.79 0.69  CALCIUM 8.5* 8.3* 8.1*   LFT Recent Labs    05/20/24 0402  PROT 5.4*  ALBUMIN 1.9*  AST 9*  ALT 5  ALKPHOS 56  BILITOT 0.2   PT/INR Recent Labs    05/18/24 0823  LABPROT 15.8*  INR 1.2    Studies/Results: DG Abd 2 Views Result Date: 05/18/2024 CLINICAL DATA:  [id number] Acute generalized abdominal pain [id number] EXAM: ABDOMEN - 2 VIEW COMPARISON:  May 17, 2024, April 19, 2024 FINDINGS: The study is degraded by patient's body habitus. Nonobstructive bowel gas pattern. No pneumoperitoneum. No organomegaly or radiopaque calculi. No acute fracture or destructive lesion. Elevation of the left hemidiaphragm again partially visualized. The right lung bases otherwise clear.Cholecystectomy clips and intrauterine device overlying the pelvis. Multilevel degenerative disc disease of the spine. IMPRESSION: Nonobstructive bowel gas pattern. Electronically Signed   By: Rogelia Myers M.D.   On: 05/18/2024 17:48   CT GUIDED NEEDLE PLACEMENT Result Date: 05/18/2024 INDICATION: [id number] Pancreatic pseudocyst [id number]. Diagnostic aspiration requested. EXAM: CT-GUIDED PERIPANCREATIC FLUID COLLECTION ASPIRATION COMPARISON:  CT CAP, 05/17/2024.  MR ABDOMEN, 05/17/2024. MEDICATIONS: The patient is currently admitted to the hospital and receiving intravenous antibiotics. The antibiotics were administered within an appropriate time frame prior to the initiation of the procedure. ANESTHESIA/SEDATION: Local anesthetic was administered. CONTRAST:  None FLUOROSCOPY TIME:  CT dose; 977 mGycm COMPLICATIONS: None immediate. PROCEDURE: RADIATION DOSE REDUCTION: This exam  was performed according to the departmental dose-optimization program which includes automated exposure control, adjustment of the mA and/or kV according to patient size and/or use of iterative reconstruction technique. Informed written consent was obtained from the patient after a discussion of the risks, benefits and alternatives to  treatment. The patient was placed supine on the CT gantry and a pre procedural CT was performed re-demonstrating the known fluid collection within the LEFT upper quadrant. The procedure was planned. A timeout was performed prior to the initiation of the procedure. The LEFT upper quadrant was prepped and draped in the usual sterile fashion. The overlying soft tissues were anesthetized with 1% lidocaine  with epinephrine . Appropriate trajectory was planned with the use of a 22 gauge spinal needle. An 18 gauge trocar needle was advanced into the collection and appropriate positioning was confirmed with a limited CT scan. 100 mL of tan brown fluid was aspirated. The needle was removed then a dressing was placed. The patient tolerated the procedure well without immediate post procedural complication. IMPRESSION: Successful CT-guided diagnostic aspiration of a peripancreatic fluid collection yielding 100 mL of 10 brown fluid. Samples were sent to the laboratory as requested by the ordering clinical team. Thom Hall, MD [facility] Rad[facility] Electronically Signed   By: Thom Hall M.D.   On: 05/18/2024 17:14       Assessment / Plan:    #32 38 year old female with acute severe pancreatitis, initial onset 03/18/2024 and initially hospitalized twice at [facility] then eventual hospitalization here 6/1 through 04/26/2024 with worsening abdominal pain. Diagnosed with severe pancreatitis with evolving pseudocyst and a dominant pseudocyst read as being a lobulated complex cyst arising from the pancreatic tail and measuring at that time 9.8 x 5.9 cm  Required chest tube for drainage of large left effusion during that admission-no fluid studies sent at that time.  Scented back to the emergency room on 05/17/2024 with progressive abdominal pain over the previous couple of days associated with nausea and vomiting Significant leukocytosis at 17.3, lactate normal  Started  on IV Zosyn. Pete imaging with CT then MRI/MRCP-left base atelectasis small left effusion, no intrahepatic ductal dilation gallbladder absent CBD 11 mm proximally and 6 mm just proximal to the ampulla There is enhancement of the pancreatic parenchyma throughout with no definite features of necrosis with significant edema of the body and tail of the pancreas with large complex cystic process in the left upper quadrant involving the wall of the proximal stomach, pancreatic tail, subdiaphragmatic space dome of the spleen including possible interruption of the splenic capsule by the complex cystic process. No fluid dissects into the wall of the proximal stomach and there is also a large tubular structure in the left paracolic gutter measuring 10.4 x 2.5 x 4.1 cm  Pseudocyst was aspirated by IR which caused severe pain afterwards  Fortunately Gram stain and cultures are negative Blood cultures also negative  Clinically she is looking and feeling better, leukocytosis resolving and has been afebrile.  Plan; continue clear liquids but add Ensure twice daily to increase caloric intake/protein Encourage ambulation Continue Zosyn 1 more day, then if continued improvement we may be able to stop We will have advanced endoscopist/Dr. Mansouraty  review her imaging on Monday and determine timing of any potential cyst gastrostomy       Principal Problem:   Pancreatic abscess Active Problems:   Acute pancreatitis   Pleural effusion on left   Encounter for smoking cessation counseling   Acute pancreatic  fluid collection     LOS: 3 days   Demontrae Gilbert EsterwoodPA-C  05/20/2024, 10:35 AM

## 2024-05-20 NOTE — Plan of Care (Signed)

## 2024-05-21 ENCOUNTER — Inpatient Hospital Stay (HOSPITAL_COMMUNITY): Payer: MEDICAID

## 2024-05-21 DIAGNOSIS — K8689 Other specified diseases of pancreas: Secondary | ICD-10-CM | POA: Diagnosis not present

## 2024-05-21 DIAGNOSIS — D649 Anemia, unspecified: Secondary | ICD-10-CM

## 2024-05-21 DIAGNOSIS — K859 Acute pancreatitis without necrosis or infection, unspecified: Secondary | ICD-10-CM | POA: Diagnosis not present

## 2024-05-21 LAB — BASIC METABOLIC PANEL WITH GFR
Anion gap: 9 (ref 5–15)
BUN: 10 mg/dL (ref 6–20)
CO2: 24 mmol/L (ref 22–32)
Calcium: 8.4 mg/dL — ABNORMAL LOW (ref 8.9–10.3)
Chloride: 103 mmol/L (ref 98–111)
Creatinine, Ser: 0.86 mg/dL (ref 0.44–1.00)
GFR, Estimated: >60 mL/min (ref >60)
Glucose, Bld: 86 mg/dL (ref 70–99)
Potassium: 3.5 mmol/L (ref 3.5–5.1)
Sodium: 136 mmol/L (ref 135–145)

## 2024-05-21 LAB — CBC WITH DIFFERENTIAL/PLATELET
Abs Immature Granulocytes: 0.04 K/uL (ref 0.00–0.07)
Basophils Absolute: 0.1 K/uL (ref 0.0–0.1)
Basophils Relative: 1 %
Eosinophils Absolute: 0.2 K/uL (ref 0.0–0.5)
Eosinophils Relative: 2 %
HCT: 31.2 % — ABNORMAL LOW (ref 36.0–46.0)
Hemoglobin: 9.7 g/dL — ABNORMAL LOW (ref 12.0–15.0)
Immature Granulocytes: 0 %
Lymphocytes Relative: 28 %
Lymphs Abs: 2.8 K/uL (ref 0.7–4.0)
MCH: 25.1 pg — ABNORMAL LOW (ref 26.0–34.0)
MCHC: 31.1 g/dL (ref 30.0–36.0)
MCV: 80.6 fL (ref 80.0–100.0)
Monocytes Absolute: 0.8 K/uL (ref 0.1–1.0)
Monocytes Relative: 8 %
Neutro Abs: 6.2 K/uL (ref 1.7–7.7)
Neutrophils Relative %: 61 %
Platelets: 438 K/uL — ABNORMAL HIGH (ref 150–400)
RBC: 3.87 MIL/uL (ref 3.87–5.11)
RDW: 14.3 % (ref 11.5–15.5)
WBC: 10 K/uL (ref 4.0–10.5)
nRBC: 0 % (ref 0.0–0.2)

## 2024-05-21 LAB — BODY FLUID CULTURE W GRAM STAIN
Culture: NO GROWTH
Gram Stain: NONE SEEN

## 2024-05-21 LAB — C-REACTIVE PROTEIN: CRP: 19.6 mg/dL — ABNORMAL HIGH (ref <1.0)

## 2024-05-21 LAB — GLUCOSE, CAPILLARY
Glucose-Capillary: 82 mg/dL (ref 70–99)
Glucose-Capillary: 102 mg/dL — ABNORMAL HIGH (ref 70–99)
Glucose-Capillary: 74 mg/dL (ref 70–99)
Glucose-Capillary: 82 mg/dL (ref 70–99)
Glucose-Capillary: 87 mg/dL (ref 70–99)

## 2024-05-21 LAB — HEMOGLOBIN AND HEMATOCRIT, BLOOD
HCT: 33.2 % — ABNORMAL LOW (ref 36.0–46.0)
Hemoglobin: 10.5 g/dL — ABNORMAL LOW (ref 12.0–15.0)

## 2024-05-21 MED ORDER — HYDROMORPHONE HCL 1 MG/ML IJ SOLN
0.5000 mg | INTRAMUSCULAR | Status: DC | PRN
  Administered 2024-05-21 – 2024-05-23 (×9): 0.5 mg via INTRAVENOUS
  Filled 2024-05-21 (×9): qty 0.5

## 2024-05-21 MED ORDER — IOHEXOL 350 MG/ML SOLN
75.0000 mL | Freq: Once | INTRAVENOUS | Status: AC | PRN
  Administered 2024-05-21: 75 mL via INTRAVENOUS

## 2024-05-21 NOTE — Progress Notes (Signed)
 PROGRESS NOTE  Katrina Lambert FMW:[id number] DOB: March 11, 1986   PCP: [facility]  Patient is from: Home.  DOA: 05/17/2024 LOS: 4  Chief complaints Chief Complaint  Patient presents with   Abdominal Pain     Brief Narrative / Interim history: 38 year old F with PMH of DM-2, HTN, asthma, IDA, morbid obesity, B12 deficiency, cervical myelopathy, tobacco use disorder and recent hospitalization at [facility] from 5/3-5/6 for idiopathic pancreatitis and at [facility] 5/31/611 for recurrent pancreatitis with pseudocyst as well as hospital-acquired pneumonia complicated by parapneumonic effusion requiring chest tube placement returning to ED with worsening intermittent abdominal pain, nausea and vomiting since her discharge from the hospital.  Symptoms gotten more persistent and she presented to ED for evaluation.  In ED, slightly tachycardic with leukocytosis to 17.  Lactic acid 0.8 and 2.1.  Lipase was 960.  CT chest, abdomen and pelvis with interval increase of pancreatic tail fluid collection extending along the LUQ inseparable from the greater curvature of the stomach, spleen, inferior aspect of left hemidiaphragm concerning for splenic capsular invasion with possible adjacent second abscess forming within the splenic parenchyma.  GI and IR consulted.  Started on cefepime  and Flagyl .  MRI ordered.  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   Patient had CT-guided diagnostic aspiration of peripancreatic fluid collection yielding 100 cc of brown fluid.  Fluid culture NGTD.  Antibiotics stopped on 7/6.  Symptoms improved.  Advance to full liquid diet.  Subjective: Seen and examined earlier this morning.  No major events overnight or this morning.  Tolerating liquid diet.  Ambulatory.  Still with some pain in LUQ radiating to her back but  improved.  Objective: Vitals:   05/20/24 1639 05/20/24 2011 05/21/24 0411 05/21/24 0758  BP: (!) 137/106 134/74 130/78 (!) 135/93  Pulse: 94 96 84 90  Resp:  18    Temp:  97.7 F (36.5 C) 97.9 F (36.6 C) 98 F (36.7 C)  TempSrc:   Oral   SpO2: 99% 96% 95% 97%  Weight:      Height:        Examination:  GENERAL: No apparent distress.  Nontoxic. HEENT: MMM.  Vision and hearing grossly intact.  NECK: Supple.  No apparent JVD.  RESP:  No IWOB.  Fair aeration bilaterally. CVS:  RRR. Heart sounds normal.  ABD/GI/GU: BS+. Abd soft.  Mild tenderness in LUQ.  No rebound. MSK/EXT:  Moves extremities. No apparent deformity. No edema.  SKIN: no apparent skin lesion or wound NEURO: AA.  Oriented appropriately.  No apparent focal neuro deficit. PSYCH: Calm. Normal affect.   Consultants:  Gastroenterology Interventional radiology  Procedures: 7/3-CT-guided diagnostic aspiration of peripancreatic fluid collection yielding 100 cc of brown fluid  Microbiology summarized: Single set blood culture NGTD Fluid culture NGTD.  Assessment and plan: Acute on chronic pancreatitis with enlarging pancreatic pseudocyst-pain and lipase improved. SIRS with tachycardia and leukocytosis: HDS.  Leukocytosis resolved.  CRP elevated to 19.6. Lactic acidosis: Mild and resolved. -Appreciate input by GI-stop antibiotics, FLD, and Ensure and evaluation for cystogastrostomy -S/p CT-guided diagnostic aspiration of peripancreatic fluid yielding 100 cc of brown fluid -Single set blood culture and aspirate culture NGTD. -Cefepime  and Flagyl  7/2-7/6 -Oxycodone  for moderate to severe pain and IV Dilaudid  for breakthrough pain.  Decrease Dilaudid  today - Continue PPI and antiemetics   NIDDM-2 with hyperglycemia: A1c 7.3%.  Does not seem to  be medication at home. Recent Labs  Lab 05/20/24 2011 05/20/24 2356 05/21/24 0412 05/21/24 0759 05/21/24 1129  GLUCAP 121* 83 82 102* 74  - SSI-sensitive  ACHS  Hypokalemia/hypomagnesemia -Monitor replenish K and Mg as appropriate  Tobacco use disorder -Encourage smoking cessation -Nicotine  patch  Essential hypertension: Normotensive for most part. -Pain control as above. -IV labetalol  as needed  Hyponatremia: Resolved. -Monitor   Asthma: Albuterol  as needed IDA: Stable.  Continue monitoring. B12: Continue B12 when able to take p.o. Cervical myelopathy: Noted PCOS: On metformin  outpatient? Mood disorder: Stable.  Not on medication  Class II obesity Body mass index is 39.95 kg/m.           DVT prophylaxis:  SCDs Start: 05/17/24 2001  Code Status: Full code Family Communication: None at bedside Level of care: Telemetry Medical Status is: Inpatient Remains inpatient appropriate because: Acute on chronic pancreatitis with pseudocyst    Final disposition: Likely home once medically stable   35 minutes with more than 50% spent in reviewing records, counseling patient/family and coordinating care.   Sch Meds:  Scheduled Meds:  feeding supplement  237 mL Oral BID BM   insulin  aspart  0-9 Units Subcutaneous TID WC   nicotine   14 mg Transdermal Daily   pantoprazole  (PROTONIX ) IV  40 mg Intravenous Q24H   sodium chloride  flush  3 mL Intravenous Q12H   Continuous Infusions:   PRN Meds:.acetaminophen , albuterol , HYDROmorphone  (DILAUDID ) injection, labetalol , melatonin, nicotine  polacrilex, ondansetron  (ZOFRAN ) IV, oxyCODONE  **OR** oxyCODONE , polyethylene glycol  Antimicrobials: Anti-infectives (From admission, onward)    Start     Dose/Rate Route Frequency Ordered Stop   05/21/24 1000  metroNIDAZOLE  (FLAGYL ) IVPB 500 mg  Status:  Discontinued        500 mg 100 mL/hr over 60 Minutes Intravenous Every 12 hours 05/20/24 2344 05/21/24 1150   05/21/24 0400  ceFEPIme  (MAXIPIME ) 2 g in sodium chloride  0.9 % 100 mL IVPB  Status:  Discontinued        2 g 200 mL/hr over 30 Minutes Intravenous Every 8 hours 05/20/24 2344  05/21/24 1150   05/18/24 0730  metroNIDAZOLE  (FLAGYL ) IVPB 500 mg  Status:  Discontinued        500 mg 100 mL/hr over 60 Minutes Intravenous Every 12 hours 05/17/24 2053 05/20/24 2344   05/18/24 0330  ceFEPIme  (MAXIPIME ) 2 g in sodium chloride  0.9 % 100 mL IVPB  Status:  Discontinued        2 g 200 mL/hr over 30 Minutes Intravenous Every 8 hours 05/17/24 2053 05/20/24 2344   05/17/24 1930  ceFEPIme  (MAXIPIME ) 2 g in sodium chloride  0.9 % 100 mL IVPB        2 g 200 mL/hr over 30 Minutes Intravenous  Once 05/17/24 1928 05/17/24 2022   05/17/24 1930  metroNIDAZOLE  (FLAGYL ) IVPB 500 mg        500 mg 100 mL/hr over 60 Minutes Intravenous  Once 05/17/24 1928 05/17/24 2059        I have personally reviewed the following labs and images: CBC: Recent Labs  Lab 05/18/24 0329 05/18/24 1644 05/19/24 0428 05/19/24 1641 05/20/24 0402 05/20/24 1721 05/21/24 0358  WBC 13.0* 15.6* 12.5*  --  12.9*  --  10.0  NEUTROABS  --  13.4* 9.8*  --   --   --  6.2  HGB 11.3* 12.6 11.9* 11.8* 10.7* 10.5* 9.7*  HCT 36.2 40.7 38.2 36.6 33.5* 33.7* 31.2*  MCV 80.6 82.2 80.6  --  82.1  --  80.6  PLT 484* 619* 504*  --  433*  --  438*   BMP &GFR Recent Labs  Lab 05/17/24 1440 05/18/24 0329 05/19/24 0428 05/20/24 0402 05/21/24 0358  NA 136 134* 133* 134* 136  K 3.3* 3.5 3.9 3.8 3.5  CL 100 101 103 105 103  CO2 23 24 22  20* 24  GLUCOSE 105* 106* 118* 101* 86  BUN 7 7 12 14 10   CREATININE 0.74 0.74 0.79 0.69 0.86  CALCIUM 9.5 8.5* 8.3* 8.1* 8.4*  MG  --  1.6* 1.8 1.7  --   PHOS  --  4.6  --   --   --    Estimated Creatinine Clearance: 110 mL/min (by C-G formula based on SCr of 0.86 mg/dL). Liver & Pancreas: Recent Labs  Lab 05/17/24 1440 05/19/24 0428 05/20/24 0402  AST 15 7* 9*  ALT 13 8 5   ALKPHOS 80 65 56  BILITOT 0.8 0.8 0.2  PROT 7.2 5.6* 5.4*  ALBUMIN 2.8* 2.1* 1.9*   Recent Labs  Lab 05/17/24 1440 05/19/24 0428 05/20/24 0402  LIPASE 940* 79* 79*   No results for input(s):  AMMONIA in the last 168 hours. Diabetic: No results for input(s): HGBA1C in the last 72 hours. Recent Labs  Lab 05/20/24 2011 05/20/24 2356 05/21/24 0412 05/21/24 0759 05/21/24 1129  GLUCAP 121* 83 82 102* 74   Cardiac Enzymes: No results for input(s): CKTOTAL, CKMB, CKMBINDEX, TROPONINI in the last 168 hours. No results for input(s): PROBNP in the last 8760 hours. Coagulation Profile: Recent Labs  Lab 05/18/24 0823  INR 1.2   Thyroid Function Tests: No results for input(s): TSH, T4TOTAL, FREET4, T3FREE, THYROIDAB in the last 72 hours. Lipid Profile: No results for input(s): CHOL, HDL, LDLCALC, TRIG, CHOLHDL, LDLDIRECT in the last 72 hours. Anemia Panel: No results for input(s): VITAMINB12, FOLATE, FERRITIN, TIBC, IRON , RETICCTPCT in the last 72 hours. Urine analysis:    Component Value Date/Time   COLORURINE YELLOW 05/17/2024 1302   APPEARANCEUR CLEAR 05/17/2024 1302   LABSPEC 1.011 05/17/2024 1302   PHURINE 6.0 05/17/2024 1302   GLUCOSEU NEGATIVE 05/17/2024 1302   HGBUR SMALL (A) 05/17/2024 1302   BILIRUBINUR NEGATIVE 05/17/2024 1302   KETONESUR NEGATIVE 05/17/2024 1302   PROTEINUR 30 (A) 05/17/2024 1302   UROBILINOGEN 0.2 12/20/2009 2301   NITRITE NEGATIVE 05/17/2024 1302   LEUKOCYTESUR NEGATIVE 05/17/2024 1302   Sepsis Labs: Invalid input(s): PROCALCITONIN, LACTICIDVEN  Microbiology: Recent Results (from the past 240 hours)  Culture, blood (single)     Status: None (Preliminary result)   Collection Time: 05/17/24  7:45 PM   Specimen: BLOOD  Result Value Ref Range Status   Specimen Description BLOOD SITE NOT SPECIFIED  Final   Special Requests   Final    BOTTLES DRAWN AEROBIC AND ANAEROBIC Blood Culture results may not be optimal due to an inadequate volume of blood received in culture bottles   Culture   Final    NO GROWTH 4 DAYS Performed at [facility], [address], [place], [place]  [address]    Report Status PENDING  Incomplete  Body fluid culture w Gram Stain     Status: None (Preliminary result)   Collection Time: 05/18/24  1:10 PM   Specimen: Body Fluid  Result Value Ref Range Status   Specimen Description FLUID  Final   Special Requests PERI PANCREATIC  Final   Gram Stain NO WBC SEEN NO ORGANISMS SEEN   Final   Culture  Final    NO GROWTH 3 DAYS Performed at [facility], [address] [place], [place] [address]    Report Status PENDING  Incomplete  Anaerobic culture w Gram Stain     Status: None (Preliminary result)   Collection Time: 05/18/24  1:10 PM   Specimen: Fluid  Result Value Ref Range Status   Specimen Description FLUID  Final   Special Requests PERI PANCREATIC  Final   Gram Stain   Final    NO WBC SEEN NO ORGANISMS SEEN Performed at [facility], [address], [place], [place] [address]    Culture   Final    NO ANAEROBES ISOLATED; CULTURE IN PROGRESS FOR 5 DAYS   Report Status PENDING  Incomplete    Radiology Studies: No results found.     Ronn Smolinsky Katrina Lambert [facility]  If 7PM-7AM, please contact night-coverage [email or web address] 05/21/2024, 11:50 AM

## 2024-05-21 NOTE — Plan of Care (Signed)

## 2024-05-21 NOTE — Progress Notes (Addendum)
 Patient ID: Katrina Lambert, female   DOB: Aug 11, 1986, 38 y.o.   MRN: [id number]    Progress Note   Subjective   Day # 4 CC; acute severe pancreatitis with enlarging pseudocyst and worsening abdominal pain, status post previous cholecystectomy  IV Zosyn  Labs today-WBC 10.0/hemoglobin 9.7/hematocrit 31.2 drifting, platelets 438 Potassium 3.5/BUN 10/creatinine 0.86 CRP 19.6 Blood cultures no growth Pseudocyst aspiration-Gram stain negative, cultures negative  Patient sitting up in bed, tolerating clear liquids and has done well with Ensure, no vomiting, feels like she may be able to tolerate full liquids. Pain is fairly well-controlled during the day but says when she gets up to ambulate she will hurt worse and usually hurts worse early in the morning after she has been asleep.   Objective   Vital signs in last 24 hours: Temp:  [97.7 F (36.5 C)-98 F (36.7 C)] 98 F (36.7 C) (07/06 0758) Pulse Rate:  [84-96] 90 (07/06 0758) Resp:  [18] 18 (07/05 2011) BP: (130-137)/(74-106) 135/93 (07/06 0758) SpO2:  [95 %-99 %] 97 % (07/06 0758) Last BM Date : 05/21/24 General: Young white female in NAD is comfortable today Heart:  Regular rate and rhythm; no murmurs Lungs: Respirations even and unlabored, decreased breath sounds left base Abdomen: Obese soft, remains tender and full across the upper abdomen and into the left upper quadrant/left mid quadrant -normal bowel sounds. Extremities:  Without edema. Neurologic:  Alert and oriented,  grossly normal neurologically. Psych:  Cooperative. Normal mood and affect.  Intake/Output from previous day: 07/05 0701 - 07/06 0700 In: 1260 [P.O.:960; IV Piggyback:300] Out: -  Intake/Output this shift: Total I/O In: 300 [P.O.:300] Out: -   Lab Results: Recent Labs    05/19/24 0428 05/19/24 1641 05/20/24 0402 05/20/24 1721 05/21/24 0358  WBC 12.5*  --  12.9*  --  10.0  HGB 11.9*   < > 10.7* 10.5* 9.7*  HCT 38.2   < > 33.5* 33.7* 31.2*   PLT 504*  --  433*  --  438*   < > = values in this interval not displayed.   BMET Recent Labs    05/19/24 0428 05/20/24 0402 05/21/24 0358  NA 133* 134* 136  K 3.9 3.8 3.5  CL 103 105 103  CO2 22 20* 24  GLUCOSE 118* 101* 86  BUN 12 14 10   CREATININE 0.79 0.69 0.86  CALCIUM 8.3* 8.1* 8.4*   LFT Recent Labs    05/20/24 0402  PROT 5.4*  ALBUMIN 1.9*  AST 9*  ALT 5  ALKPHOS 56  BILITOT 0.2   PT/INR No results for input(s): LABPROT, INR in the last 72 hours.    Assessment / Plan:    Number one 38 year old white female with acute severe pancreatitis, initial onset 03/18/2024, with 2 short hospitalizations [facility] prior to being admitted here 6/1 through 04/26/2024.  At that time diagnosed with severe pancreatitis with evolving pseudocyst with a dominant pseudocyst being read as lobulated/complex cyst arising from the pancreatic tail 9.8 x 5.9 cm. Also treated for left lower lobe pneumonia during initial course.  Prior chest tube drainage for large left effusion during her admission here no fluid studies sent.  Presented back to the ER 05/17/2024 with progressive abdominal pain over the previous couple of days then nausea and vomiting and inability to tolerate p.o.'s Leukocytosis with WBC 17.3 on admit, lactate normal IV Zosyn started Repeat CT then MRI/MRCP-no evidence for pancreatic necrosis, significant edema of the body and the tail of  the pancreas with large complex cystic process left upper quadrant involving the wall of the proximal stomach, pancreatic tail subdiaphragmatic space dome of the spleen including possible interruption of the splenic capsule-now measuring 12.5 x 12.7 x 7.9 cm and then tracking down into the left lateral abdomen with a long tubular structure.  Pseudocyst was aspirated and fortunately no evidence of abscess, cultures negative Day 4 Zosyn  Patient has had gradual improvement, had severe pain for about 24 hours after the aspiration,  that has settled down and she is now tolerating clear liquids and Ensure supplements  Is afebrile and leukocytosis has resolved.  #2 Anemia, she has had a drift in hemoglobin likely multifactoral, could have bled a bit after the aspiration  Plan; advance to full liquids, continue Ensure between meals, trying to push nutrition and avoid core track Will stop IV Zosyn, monitor for fever and leukocytosis  Dr. Wilhelmenia  to review imaging early this week, and determine timing of probable cyst gastrostomy. Will keep NPO in am - in event decision made to proceed with procedure tomorrow. GI will continue to follow with you     Principal Problem:   Pancreatic abscess Active Problems:   Acute pancreatitis   Pleural effusion on left   Encounter for smoking cessation counseling   Acute pancreatic fluid collection     LOS: 4 days   [name] PA-C 05/21/2024, 10:35 AM

## 2024-05-22 DIAGNOSIS — R1012 Left upper quadrant pain: Secondary | ICD-10-CM

## 2024-05-22 DIAGNOSIS — R630 Anorexia: Secondary | ICD-10-CM

## 2024-05-22 DIAGNOSIS — K859 Acute pancreatitis without necrosis or infection, unspecified: Secondary | ICD-10-CM | POA: Diagnosis not present

## 2024-05-22 DIAGNOSIS — K863 Pseudocyst of pancreas: Secondary | ICD-10-CM | POA: Diagnosis not present

## 2024-05-22 DIAGNOSIS — R110 Nausea: Secondary | ICD-10-CM | POA: Diagnosis not present

## 2024-05-22 LAB — CBC
HCT: 31.5 % — ABNORMAL LOW (ref 36.0–46.0)
Hemoglobin: 9.7 g/dL — ABNORMAL LOW (ref 12.0–15.0)
MCH: 24.8 pg — ABNORMAL LOW (ref 26.0–34.0)
MCHC: 30.8 g/dL (ref 30.0–36.0)
MCV: 80.6 fL (ref 80.0–100.0)
Platelets: 473 K/uL — ABNORMAL HIGH (ref 150–400)
RBC: 3.91 MIL/uL (ref 3.87–5.11)
RDW: 14.4 % (ref 11.5–15.5)
WBC: 9.3 K/uL (ref 4.0–10.5)
nRBC: 0 % (ref 0.0–0.2)

## 2024-05-22 LAB — CULTURE, BLOOD (SINGLE): Culture: NO GROWTH

## 2024-05-22 LAB — GLUCOSE, CAPILLARY
Glucose-Capillary: 83 mg/dL (ref 70–99)
Glucose-Capillary: 143 mg/dL — ABNORMAL HIGH (ref 70–99)
Glucose-Capillary: 85 mg/dL (ref 70–99)
Glucose-Capillary: 98 mg/dL (ref 70–99)
Glucose-Capillary: 109 mg/dL — ABNORMAL HIGH (ref 70–99)

## 2024-05-22 LAB — HEMOGLOBIN AND HEMATOCRIT, BLOOD
HCT: 32.9 % — ABNORMAL LOW (ref 36.0–46.0)
Hemoglobin: 10.3 g/dL — ABNORMAL LOW (ref 12.0–15.0)

## 2024-05-22 MED ORDER — DIPHENHYDRAMINE HCL 25 MG PO CAPS
25.0000 mg | ORAL_CAPSULE | Freq: Four times a day (QID) | ORAL | Status: DC | PRN
  Administered 2024-05-22 – 2024-05-24 (×4): 25 mg via ORAL
  Filled 2024-05-22 (×4): qty 1

## 2024-05-22 NOTE — TOC Progression Note (Signed)
 Transition of Care [facility]) - Progression Note    Patient Details  Name: Katrina Lambert MRN: [id number] Date of Birth: 04/18/1986  Transition of Care [facility]) CM/SW Contact  Tom-Johnson, [place] Borchardt Daphne, RN Phone Number: 05/22/2024, 4:41 PM  Clinical Narrative:     Patient completed IV abx and Flagyl  yesterday 05/21/24. Tolerating Liquid diet. GI following.   Patient not Medically ready for discharge.  CM will continue to follow for discharge needs as patient progresses with care towards discharge.           Expected Discharge Plan and Services                                               Social Determinants of Health (SDOH) Interventions SDOH Screenings   Food Insecurity: No Food Insecurity (05/18/2024)  Housing: Low Risk  (05/18/2024)  Transportation Needs: No Transportation Needs (05/18/2024)  Recent Concern: Transportation Needs - Unmet Transportation Needs (03/18/2024)   Received from [facility]  Utilities: Not At Risk (05/18/2024)  Recent Concern: Utilities - High Risk (03/20/2024)   Received from [facility]  Financial Resource Strain: Low Risk  (03/18/2024)   Received from [facility]  Physical Activity: Inactive (03/18/2024)   Received from [facility]  Social Connections: Socially Isolated (03/18/2024)   Received from [facility]  Stress: Stress Concern Present (03/18/2024)   Received from [facility]  Tobacco Use: High Risk (05/17/2024)  Health Literacy: Low Risk  (03/18/2024)   Received from [facility]    Readmission Risk Interventions    05/18/2024    2:37 PM  Readmission Risk Prevention Plan  Transportation Screening Complete  PCP or Specialist Appt within 5-7 Days Complete  Home Care Screening Complete  Medication Review (RN CM) Referral to Pharmacy

## 2024-05-22 NOTE — Progress Notes (Incomplete)
 Daily Progress Note  DOA: 05/17/2024 Hospital Day: 6   Cc:    Brief History:  38 y.o. year old female with a medical history including but not limited to obesity, DM2, HTN asthma, b12 deficiency, pancreatitis c/b pseudocysts. Readmitted 7/2 with worsening abdominal pain , N/V ( had been admitted here in June with pancreatitis / pseudocysts as well as pneumonia). See 7/3 GI consult note   ASSESSMENT    Severe acute (presumed idiopathic) pancreatitis, initial onset early May 2025 [facility] [place]). Readmitted here 6/1 through 04/26/2024 with worsening abdominal pain and was diagnosed with severe pancreatitis with evolving pseudocyst with a dominant pseudocyst read as lobulated a complex arising from the pancreatic tail and measuring 9.8 x 5.9 cm.        Principal Problem:   Pancreatic abscess Active Problems:   Acute pancreatitis   Pleural effusion on left   Encounter for smoking cessation counseling   Acute pancreatic fluid collection   PLAN   ***    Subjective       Objective   GI Studies:    Recent Labs    05/20/24 0402 05/20/24 1721 05/21/24 0358 05/21/24 1610 05/22/24 0406  WBC 12.9*  --  10.0  --  9.3  HGB 10.7*   < > 9.7* 10.5* 9.7*  HCT 33.5*   < > 31.2* 33.2* 31.5*  MCV 82.1  --  80.6  --  80.6  PLT 433*  --  438*  --  473*   < > = values in this interval not displayed.   No results for input(s): FOLATE, VITAMINB12, FERRITIN, TIBC, IRONPCTSAT in the last 72 hours. Recent Labs    05/20/24 0402 05/21/24 0358  NA 134* 136  K 3.8 3.5  CL 105 103  CO2 20* 24  GLUCOSE 101* 86  BUN 14 10  CREATININE 0.69 0.86  CALCIUM 8.1* 8.4*   Recent Labs    05/20/24 0402  PROT 5.4*  ALBUMIN 1.9*  AST 9*  ALT 5  ALKPHOS 56  BILITOT 0.2      Imaging:  CT ABDOMEN PELVIS W CONTRAST CLINICAL DATA:  38 year old female with history of pancreatic pseudocyst status post image guided aspiration on 05/18/2024.  EXAM: CT ABDOMEN AND  PELVIS WITH CONTRAST  TECHNIQUE: Multidetector CT imaging of the abdomen and pelvis was performed using the standard protocol following bolus administration of intravenous contrast.  RADIATION DOSE REDUCTION: This exam was performed according to the departmental dose-optimization program which includes automated exposure control, adjustment of the mA and/or kV according to patient size and/or use of iterative reconstruction technique.  CONTRAST:  75mL OMNIPAQUE  IOHEXOL  350 MG/ML SOLN  COMPARISON:  05/17/2024  FINDINGS: Lower chest: Similar appearing small left pleural effusion with associated left basilar passive atelectasis.  Hepatobiliary: No focal liver abnormality is seen. Status post cholecystectomy. No biliary dilatation.  Pancreas: Similar normal appearing head and body of the pancreas with large multiloculated cystic lesion extending from the pancreatic tail superiorly to the greater curvature of the stomach and perisplenic regions. The collection is overall similar to most recent comparison CT.  Spleen: Splenic parenchyma is normal in size enhancement. Again seen is a large subcapsular appearing splenic cystic lesion with peripheral enhancement arising from the superior aspect which measures up to approximately 13.6 x 8.0 cm in maximum axial dimensions.  Adrenals/Urinary Tract: Adrenal glands are unremarkable. Kidneys are normal, without renal calculi, focal lesion, or hydronephrosis. Bladder is unremarkable.  Stomach/Bowel: Stomach is within normal  limits, nearly collapsed extrinsic compression from neighboring pancreatic cysts. Appendix appears normal. No evidence of bowel wall thickening, distention, or inflammatory changes.  Vascular/Lymphatic: No significant vascular findings are present. No enlarged abdominal or pelvic lymph nodes.  Reproductive: Intrauterine device in place in unchanged position. Uterus and bilateral adnexa are otherwise  unremarkable.  Other: Interval development of crescentic small volume ascites in the left upper quadrant.  Musculoskeletal: No acute osseous abnormality. Similar appearing advanced discogenic degenerative change at L3-L4 with unchanged anterolisthesis at this level.  IMPRESSION: 1. Similar appearing large multiloculated cystic lesion extending from the pancreatic tail superiorly to the greater curvature of the stomach and perisplenic regions. The collection is overall similar to most recent comparison CT. 2. Interval development of crescentic small volume ascites in the left upper quadrant. Suspect pancreatic pseudocyst contents after recent instrumentation. 3. Similar appearing small left pleural effusion with associated left basilar passive atelectasis.  Katrina Sides, MD  [facility]  [facility]  Electronically Signed   By: Katrina Lambert M.D.   On: 05/21/2024 21:23     Scheduled inpatient medications:   feeding supplement  237 mL Oral BID BM   insulin  aspart  0-9 Units Subcutaneous TID WC   nicotine   14 mg Transdermal Daily   pantoprazole  (PROTONIX ) IV  40 mg Intravenous Q24H   sodium chloride  flush  3 mL Intravenous Q12H   Continuous inpatient infusions:  PRN inpatient medications: acetaminophen , albuterol , diphenhydrAMINE , HYDROmorphone  (DILAUDID ) injection, labetalol , melatonin, nicotine  polacrilex, ondansetron  (ZOFRAN ) IV, oxyCODONE  **OR** oxyCODONE , polyethylene glycol  Vital signs in last 24 hours: Temp:  [98.1 F (36.7 C)-98.3 F (36.8 C)] 98.3 F (36.8 C) (07/07 0749) Pulse Rate:  [90-104] 104 (07/07 0749) Resp:  [17-19] 19 (07/07 0749) BP: (147-167)/(92-104) 147/99 (07/07 0749) SpO2:  [94 %-99 %] 94 % (07/07 0749) Last BM Date : 05/21/24  Intake/Output Summary (Last 24 hours) at 05/22/2024 1522 Last data filed at 05/22/2024 1024 Gross per 24 hour  Intake 780 ml  Output 0 ml  Net 780 ml    Intake/Output  from previous day: 07/06 0701 - 07/07 0700 In: 1203 [P.O.:900; I.V.:3; IV Piggyback:300] Out: -  Intake/Output this shift: Total I/O In: 300 [P.O.:300] Out: 0    Physical Exam:  General: Alert ***female in NAD Heart:  Regular rate and rhythm.  Pulmonary: Normal respiratory effort Abdomen: Soft, nondistended, nontender. Normal bowel sounds. Extremities: No lower extremity edema  Neurologic: Alert and oriented Psych: Pleasant. Cooperative     LOS: 5 days   Vina Dasen ,NP 05/22/2024, 3:22 PM

## 2024-05-22 NOTE — Progress Notes (Signed)
 [facility] Progress Note  Chief Complaint: Upper abdominal pain and pancreatic pseudocyst  History:  Signout received from Dr. Federico and additional chart review performed. Patient known to me from prior hospitalization as well.  Continues to have upper abdominal pain more toward the left side and flank, stable since seen yesterday.  She says her appetite (both on the outpatient side and while here) comes and goes day-to-day depending on the level of nausea and pain she is having.  Aspiration of pancreatic fluid was sterile and thus antibiotics of been discontinued.  No reported fever, no vomiting  ROS: Cardiovascular: No chest pain Respiratory: No cough.  Pain worse with deep breath Urinary: No dysuria  Objective:   Current Facility-Administered Medications:    acetaminophen  (TYLENOL ) tablet 1,000 mg, 1,000 mg, Oral, Q6H PRN, Segars, Jonathan, MD   albuterol  (PROVENTIL ) (2.5 MG/3ML) 0.083% nebulizer solution 2.5 mg, 2.5 mg, Nebulization, Q4H PRN, Segars, Jonathan, MD   diphenhydrAMINE  (BENADRYL ) capsule 25 mg, 25 mg, Oral, Q6H PRN, Rashid, Farhan, MD, 25 mg at 05/22/24 1131   feeding supplement (ENSURE PLUS HIGH PROTEIN) liquid 237 mL, 237 mL, Oral, BID BM, Esterwood, Amy S, PA-C, 237 mL at 05/22/24 1455   HYDROmorphone  (DILAUDID ) injection 0.5 mg, 0.5 mg, Intravenous, Q3H PRN, Gonfa, Taye T, MD, 0.5 mg at 05/22/24 1653   insulin  aspart (novoLOG ) injection 0-9 Units, 0-9 Units, Subcutaneous, TID WC, Gonfa, Taye T, MD, 1 Units at 05/22/24 9175   labetalol  (NORMODYNE ) injection 10 mg, 10 mg, Intravenous, Q2H PRN, Gonfa, Taye T, MD   melatonin tablet 6 mg, 6 mg, Oral, QHS PRN, Segars, Jonathan, MD, 6 mg at 05/21/24 2232   nicotine  (NICODERM CQ  - dosed in mg/24 hours) patch 14 mg, 14 mg, Transdermal, Daily, Segars, Dorn, MD, 14 mg at 05/22/24 1052   nicotine  polacrilex (NICORETTE ) gum 2 mg, 2 mg, Oral, PRN, Segars, Jonathan, MD   ondansetron  (ZOFRAN ) injection 4 mg, 4 mg, Intravenous,  Q6H PRN, Segars, Jonathan, MD, 4 mg at 05/20/24 2216   oxyCODONE  (Oxy IR/ROXICODONE ) immediate release tablet 2.5 mg, 2.5 mg, Oral, Q4H PRN, 7.5 mg at 05/20/24 2216 **OR** oxyCODONE  (Oxy IR/ROXICODONE ) immediate release tablet 5 mg, 5 mg, Oral, Q4H PRN, Segars, Jonathan, MD, 5 mg at 05/22/24 1132   pantoprazole  (PROTONIX ) injection 40 mg, 40 mg, Intravenous, Q24H, Gonfa, Taye T, MD, 40 mg at 05/22/24 1132   polyethylene glycol (MIRALAX  / GLYCOLAX ) packet 17 g, 17 g, Oral, Daily PRN, Segars, Jonathan, MD   sodium chloride  flush (NS) 0.9 % injection 3 mL, 3 mL, Intravenous, Q12H, Segars, Jonathan, MD, 3 mL at 05/22/24 1000     Vital signs in last 24 hrs: Vitals:   05/22/24 1708 05/22/24 1719  BP: (!) 173/112 (!) 179/104  Pulse: 93 89  Resp: 19   Temp: 98 F (36.7 C)   SpO2: 96%     Intake/Output Summary (Last 24 hours) at 05/22/2024 1722 Last data filed at 05/22/2024 1552 Gross per 24 hour  Intake 1080 ml  Output 0 ml  Net 1080 ml     Physical Exam Chronically ill-appearing woman.  Alert, conversational and appropriate. HEENT: sclera anicteric, oral mucosa without lesions Neck: supple, no thyromegaly, JVD or lymphadenopathy Cardiac: RRR without murmurs, S1S2 heard, no peripheral edema Pulm: clear to auscultation bilaterally, normal RR and effort noted Abdomen: soft, epigastric and left upper quadrant tenderness, with active bowel sounds. No guarding or palpable hepatosplenomegaly Skin; warm and dry, pale, no jaundice  Recent Labs:  Latest Ref Rng & Units 05/22/2024    4:06 AM 05/21/2024    4:10 PM 05/21/2024    3:58 AM  CBC  WBC 4.0 - 10.5 K/uL 9.3   10.0   Hemoglobin 12.0 - 15.0 g/dL 9.7  89.4  9.7   Hematocrit 36.0 - 46.0 % 31.5  33.2  31.2   Platelets 150 - 400 K/uL 473   438     Recent Labs  Lab 05/18/24 0823  INR 1.2      Latest Ref Rng & Units 05/21/2024    3:58 AM 05/20/2024    4:02 AM 05/19/2024    4:28 AM  CMP  Glucose 70 - 99 mg/dL 86  898  881   BUN 6 - 20  mg/dL 10  14  12    Creatinine 0.44 - 1.00 mg/dL 9.13  9.30  9.20   Sodium 135 - 145 mmol/L 136  134  133   Potassium 3.5 - 5.1 mmol/L 3.5  3.8  3.9   Chloride 98 - 111 mmol/L 103  105  103   CO2 22 - 32 mmol/L 24  20  22    Calcium 8.9 - 10.3 mg/dL 8.4  8.1  8.3   Total Protein 6.5 - 8.1 g/dL  5.4  5.6   Total Bilirubin 0.0 - 1.2 mg/dL  0.2  0.8   Alkaline Phos 38 - 126 U/L  56  65   AST 15 - 41 U/L  9  7   ALT 0 - 44 U/L  5  8      Radiologic studies: CLINICAL DATA:  38 year old female with history of pancreatic pseudocyst status post image guided aspiration on 05/18/2024.   EXAM: CT ABDOMEN AND PELVIS WITH CONTRAST   TECHNIQUE: Multidetector CT imaging of the abdomen and pelvis was performed using the standard protocol following bolus administration of intravenous contrast.   RADIATION DOSE REDUCTION: This exam was performed according to the departmental dose-optimization program which includes automated exposure control, adjustment of the mA and/or kV according to patient size and/or use of iterative reconstruction technique.   CONTRAST:  75mL OMNIPAQUE  IOHEXOL  350 MG/ML SOLN   COMPARISON:  05/17/2024   FINDINGS: Lower chest: Similar appearing small left pleural effusion with associated left basilar passive atelectasis.   Hepatobiliary: No focal liver abnormality is seen. Status post cholecystectomy. No biliary dilatation.   Pancreas: Similar normal appearing head and body of the pancreas with large multiloculated cystic lesion extending from the pancreatic tail superiorly to the greater curvature of the stomach and perisplenic regions. The collection is overall similar to most recent comparison CT.   Spleen: Splenic parenchyma is normal in size enhancement. Again seen is a large subcapsular appearing splenic cystic lesion with peripheral enhancement arising from the superior aspect which measures up to approximately 13.6 x 8.0 cm in maximum axial dimensions.    Adrenals/Urinary Tract: Adrenal glands are unremarkable. Kidneys are normal, without renal calculi, focal lesion, or hydronephrosis. Bladder is unremarkable.   Stomach/Bowel: Stomach is within normal limits, nearly collapsed extrinsic compression from neighboring pancreatic cysts. Appendix appears normal. No evidence of bowel wall thickening, distention, or inflammatory changes.   Vascular/Lymphatic: No significant vascular findings are present. No enlarged abdominal or pelvic lymph nodes.   Reproductive: Intrauterine device in place in unchanged position. Uterus and bilateral adnexa are otherwise unremarkable.   Other: Interval development of crescentic small volume ascites in the left upper quadrant.   Musculoskeletal: No acute osseous abnormality. Similar appearing advanced discogenic  degenerative change at L3-L4 with unchanged anterolisthesis at this level.   IMPRESSION: 1. Similar appearing large multiloculated cystic lesion extending from the pancreatic tail superiorly to the greater curvature of the stomach and perisplenic regions. The collection is overall similar to most recent comparison CT. 2. Interval development of crescentic small volume ascites in the left upper quadrant. Suspect pancreatic pseudocyst contents after recent instrumentation. 3. Similar appearing small left pleural effusion with associated left basilar passive atelectasis.   Ester Sides, MD   [facility]   [facility]     Electronically Signed   By: Ester Sides M.D.   On: 05/21/2024 21:23  Assessment & Plan  Assessment: Upper abdominal pain, primarily left upper quadrant into the flank from large pancreatic pseudocyst.  (05/21/2024 CT abdomen and pelvis images personally reviewed) Acute idiopathic pancreatitis diagnosed in [place], [place]  May of this year.  Persistent nausea and pain are limiting her appetite and oral intake as  well.  Updated cross-sectional imaging done at the request of our advanced endoscopist.  We need a further conversation to determine the feasibility of any potential endoscopic drainage versus gastrostomy of this.  I do not know if this fluid collection is yet organized enough or if it is anatomy relative to the spleen and vasculature we will allow an endoscopic approach.  Surgical approach would certainly be a big undertaking with multiple potential complications and likely require splenectomy.  Plan: Patient would like to advance to a soft consistency diet and see how she does with that. In the interim, I will open further dialogue with our advanced endoscopist and return to see this patient tomorrow.  (High degree medical decision making)   Victory LITTIE Brand III Office: [phone]

## 2024-05-22 NOTE — Progress Notes (Signed)
 PROGRESS NOTE    Katrina Lambert  FMW:[id number] DOB: [birth year] DOA: 05/17/2024 PCP: [facility]   Brief Narrative:  38 year old F with PMH of DM-2, HTN, asthma, IDA, morbid obesity, B12 deficiency, cervical myelopathy, tobacco use disorder and recent hospitalization at [facility] from 5/3-5/6 for idiopathic pancreatitis and at [facility] 5/31/611 for recurrent pancreatitis with pseudocyst as well as hospital-acquired pneumonia complicated by parapneumonic effusion requiring chest tube placement returning to ED with worsening intermittent abdominal pain, nausea and vomiting since her discharge from the hospital. Being managed for abdominal pain in the setting of pancreatic pseudocyst, s/p percutaneous cyst drainage.   Assessment & Plan:  Principal Problem:   Pancreatic abscess Active Problems:   Acute pancreatitis   Pleural effusion on left   Encounter for smoking cessation counseling   Acute pancreatic fluid collection   Acute on chronic pancreatitis with enlarging pancreatic pseudocyst-: SIRS with tachycardia and leukocytosis: HDS.  Leukocytosis resolved.  CRP elevated to 19.6. Lactic acidosis: Mild and resolved. -May need cystogastrostomy -S/p CT-guided diagnostic aspiration of peripancreatic fluid yielding 100 cc of brown fluid -Single set blood culture and aspirate culture NGTD. -Cefepime  and Flagyl  7/2-7/6 -Oxycodone  for moderate to severe pain and IV Dilaudid  for breakthrough pain - Continue PPI and antiemetics -Etiology of pancreatitis unclear. She did have a spider bite before all of this started.   NIDDM-2 with hyperglycemia: A1c 7.3%.  Does not seem to be medication at home. Last Labs         Recent Labs  Lab 05/20/24 2011 05/20/24 2356 05/21/24 0412 05/21/24 0759 05/21/24 1129  GLUCAP 121* 83 82 102* 74    - SSI-sensitive ACHS   Hypokalemia/hypomagnesemia: replete prn   Tobacco use disorder -Counseled extensively regarding tobacco cessation. -Nicotine  patch    Essential hypertension: prn antihypertensives   Hyponatremia: Resolved. -Monitor   Asthma: Albuterol  as needed  IDA: Stable.  Continue monitoring.  B12: Continue B12   Cervical myelopathy: Noted  PCOS: Out patient follow up.  Mood disorder: Stable.  Not on medication   Class III obesity Counseled extensively regarding dietary modifications and exercise to facilitate weight loss.   DVT prophylaxis: SCDs Start: 05/17/24 2001     Code Status: Full Code Family Communication:   Status is: Inpatient Remains inpatient appropriate because: pancreatitis, pseudocyst    Subjective:  Complaining of  left upper quadrant abdominal pain. No vomiting. She said this all started back in May. She said she had a spider bite on her thigh before the pancreatitis.  Lives at home with her daughter.  Examination:  General exam: Appears calm and comfortable  Respiratory system: Clear to auscultation. Respiratory effort normal. Cardiovascular system: S1 & S2 heard, RRR. No JVD, murmurs, rubs, gallops or clicks. No pedal edema. Gastrointestinal system: Abdomen is nondistended, soft. Slight tenderness to palpation over LUQ. No organomegaly or masses felt. Normal bowel sounds heard. Central nervous system: Alert and oriented. No focal neurological deficits. Extremities: Symmetric 5 x 5 power. Skin: No rashes, lesions or ulcers Psychiatry: Judgement and insight appear normal. Mood & affect appropriate.      Diet Orders (From admission, onward)     Start     Ordered   05/21/24 1036  Diet full liquid Room service appropriate? Yes; Fluid consistency: Thin  Diet effective now       Question Answer Comment  Room service appropriate? Yes   Fluid consistency: Thin      05/21/24 1035  Objective: Vitals:   05/21/24 1613 05/21/24 2014 05/22/24 0540 05/22/24 0749  BP: (!) 157/97 (!) 167/104 (!) 159/92 (!) 147/99  Pulse: 100 93 90 (!) 104  Resp:   17 19  Temp:  98.1 F (36.7  C) 98.2 F (36.8 C) 98.3 F (36.8 C)  TempSrc:  Oral Oral   SpO2: 97% 99% 96% 94%  Weight:      Height:        Intake/Output Summary (Last 24 hours) at 05/22/2024 0903 Last data filed at 05/21/2024 2300 Gross per 24 hour  Intake 903 ml  Output --  Net 903 ml   Filed Weights   05/17/24 1259 05/18/24 0800  Weight: 108.9 kg 108.9 kg    Scheduled Meds:  feeding supplement  237 mL Oral BID BM   insulin  aspart  0-9 Units Subcutaneous TID WC   nicotine   14 mg Transdermal Daily   pantoprazole  (PROTONIX ) IV  40 mg Intravenous Q24H   sodium chloride  flush  3 mL Intravenous Q12H   Continuous Infusions:  Nutritional status     Body mass index is 39.95 kg/m.  Data Reviewed:   CBC: Recent Labs  Lab 05/18/24 1644 05/19/24 0428 05/19/24 1641 05/20/24 0402 05/20/24 1721 05/21/24 0358 05/21/24 1610 05/22/24 0406  WBC 15.6* 12.5*  --  12.9*  --  10.0  --  9.3  NEUTROABS 13.4* 9.8*  --   --   --  6.2  --   --   HGB 12.6 11.9*   < > 10.7* 10.5* 9.7* 10.5* 9.7*  HCT 40.7 38.2   < > 33.5* 33.7* 31.2* 33.2* 31.5*  MCV 82.2 80.6  --  82.1  --  80.6  --  80.6  PLT 619* 504*  --  433*  --  438*  --  473*   < > = values in this interval not displayed.   Basic Metabolic Panel: Recent Labs  Lab 05/17/24 1440 05/18/24 0329 05/19/24 0428 05/20/24 0402 05/21/24 0358  NA 136 134* 133* 134* 136  K 3.3* 3.5 3.9 3.8 3.5  CL 100 101 103 105 103  CO2 23 24 22  20* 24  GLUCOSE 105* 106* 118* 101* 86  BUN 7 7 12 14 10   CREATININE 0.74 0.74 0.79 0.69 0.86  CALCIUM 9.5 8.5* 8.3* 8.1* 8.4*  MG  --  1.6* 1.8 1.7  --   PHOS  --  4.6  --   --   --    GFR: Estimated Creatinine Clearance: 110 mL/min (by C-G formula based on SCr of 0.86 mg/dL). Liver Function Tests: Recent Labs  Lab 05/17/24 1440 05/19/24 0428 05/20/24 0402  AST 15 7* 9*  ALT 13 8 5   ALKPHOS 80 65 56  BILITOT 0.8 0.8 0.2  PROT 7.2 5.6* 5.4*  ALBUMIN 2.8* 2.1* 1.9*   Recent Labs  Lab 05/17/24 1440 05/19/24 0428  05/20/24 0402  LIPASE 940* 79* 79*   No results for input(s): AMMONIA in the last 168 hours. Coagulation Profile: Recent Labs  Lab 05/18/24 0823  INR 1.2   Cardiac Enzymes: No results for input(s): CKTOTAL, CKMB, CKMBINDEX, TROPONINI in the last 168 hours. BNP (last 3 results) No results for input(s): PROBNP in the last 8760 hours. HbA1C: No results for input(s): HGBA1C in the last 72 hours. CBG: Recent Labs  Lab 05/21/24 1129 05/21/24 1615 05/21/24 2015 05/22/24 0210 05/22/24 0750  GLUCAP 74 82 87 83 143*   Lipid Profile: No results for input(s): CHOL, HDL, LDLCALC,  TRIG, CHOLHDL, LDLDIRECT in the last 72 hours. Thyroid Function Tests: No results for input(s): TSH, T4TOTAL, FREET4, T3FREE, THYROIDAB in the last 72 hours. Anemia Panel: No results for input(s): VITAMINB12, FOLATE, FERRITIN, TIBC, IRON , RETICCTPCT in the last 72 hours. Sepsis Labs: Recent Labs  Lab 05/17/24 1645 05/17/24 1823 05/17/24 2230  LATICACIDVEN 0.8 2.1* 0.8    Recent Results (from the past 240 hours)  Culture, blood (single)     Status: None   Collection Time: 05/17/24  7:45 PM   Specimen: BLOOD  Result Value Ref Range Status   Specimen Description BLOOD SITE NOT SPECIFIED  Final   Special Requests   Final    BOTTLES DRAWN AEROBIC AND ANAEROBIC Blood Culture results may not be optimal due to an inadequate volume of blood received in culture bottles   Culture   Final    NO GROWTH 5 DAYS Performed at [facility], [address] [place], [place] [address]    Report Status 05/22/2024 FINAL  Final  Body fluid culture w Gram Stain     Status: None   Collection Time: 05/18/24  1:10 PM   Specimen: Body Fluid  Result Value Ref Range Status   Specimen Description FLUID  Final   Special Requests PERI PANCREATIC  Final   Gram Stain NO WBC SEEN NO ORGANISMS SEEN   Final   Culture   Final    NO GROWTH 3 DAYS Performed at [facility], [address] [place], [place] [address]    Report Status 05/21/2024 FINAL  Final  Anaerobic culture w Gram Stain     Status: None (Preliminary result)   Collection Time: 05/18/24  1:10 PM   Specimen: Fluid  Result Value Ref Range Status   Specimen Description FLUID  Final   Special Requests PERI PANCREATIC  Final   Gram Stain   Final    NO WBC SEEN NO ORGANISMS SEEN Performed at [facility], [address] [place], [place] [address]    Culture   Final    NO ANAEROBES ISOLATED; CULTURE IN PROGRESS FOR 5 DAYS   Report Status PENDING  Incomplete         Radiology Studies: CT ABDOMEN PELVIS W CONTRAST Result Date: 05/21/2024 CLINICAL DATA:  38 year old female with history of pancreatic pseudocyst status post image guided aspiration on 05/18/2024. EXAM: CT ABDOMEN AND PELVIS WITH CONTRAST TECHNIQUE: Multidetector CT imaging of the abdomen and pelvis was performed using the standard protocol following bolus administration of intravenous contrast. RADIATION DOSE REDUCTION: This exam was performed according to the departmental dose-optimization program which includes automated exposure control, adjustment of the mA and/or kV according to patient size and/or use of iterative reconstruction technique. CONTRAST:  75mL OMNIPAQUE  IOHEXOL  350 MG/ML SOLN COMPARISON:  05/17/2024 FINDINGS: Lower chest: Similar appearing small left pleural effusion with associated left basilar passive atelectasis. Hepatobiliary: No focal liver abnormality is seen. Status post cholecystectomy. No biliary dilatation. Pancreas: Similar normal appearing head and body of the pancreas with large multiloculated cystic lesion extending from the pancreatic tail superiorly to the greater curvature of the stomach and perisplenic regions. The collection is overall similar to most recent comparison CT. Spleen: Splenic parenchyma is normal in size enhancement. Again seen is a large subcapsular appearing splenic cystic  lesion with peripheral enhancement arising from the superior aspect which measures up to approximately 13.6 x 8.0 cm in maximum axial dimensions. Adrenals/Urinary Tract: Adrenal glands are unremarkable. Kidneys are normal, without  renal calculi, focal lesion, or hydronephrosis. Bladder is unremarkable. Stomach/Bowel: Stomach is within normal limits, nearly collapsed extrinsic compression from neighboring pancreatic cysts. Appendix appears normal. No evidence of bowel wall thickening, distention, or inflammatory changes. Vascular/Lymphatic: No significant vascular findings are present. No enlarged abdominal or pelvic lymph nodes. Reproductive: Intrauterine device in place in unchanged position. Uterus and bilateral adnexa are otherwise unremarkable. Other: Interval development of crescentic small volume ascites in the left upper quadrant. Musculoskeletal: No acute osseous abnormality. Similar appearing advanced discogenic degenerative change at L3-L4 with unchanged anterolisthesis at this level. IMPRESSION: 1. Similar appearing large multiloculated cystic lesion extending from the pancreatic tail superiorly to the greater curvature of the stomach and perisplenic regions. The collection is overall similar to most recent comparison CT. 2. Interval development of crescentic small volume ascites in the left upper quadrant. Suspect pancreatic pseudocyst contents after recent instrumentation. 3. Similar appearing small left pleural effusion with associated left basilar passive atelectasis. Ester Sides, MD [facility] Electronically Signed   By: Ester Sides M.D.   On: 05/21/2024 21:23           LOS: 5 days   Time spent= 40 mins    Deliliah Room, MD [facility]  If 7PM-7AM, please contact night-coverage  05/22/2024, 9:03 AM

## 2024-05-23 DIAGNOSIS — K859 Acute pancreatitis without necrosis or infection, unspecified: Secondary | ICD-10-CM | POA: Diagnosis not present

## 2024-05-23 DIAGNOSIS — K863 Pseudocyst of pancreas: Secondary | ICD-10-CM | POA: Diagnosis not present

## 2024-05-23 DIAGNOSIS — R1012 Left upper quadrant pain: Secondary | ICD-10-CM | POA: Diagnosis not present

## 2024-05-23 DIAGNOSIS — R110 Nausea: Secondary | ICD-10-CM | POA: Diagnosis not present

## 2024-05-23 DIAGNOSIS — R630 Anorexia: Secondary | ICD-10-CM | POA: Diagnosis not present

## 2024-05-23 LAB — CBC WITH DIFFERENTIAL/PLATELET
Abs Immature Granulocytes: 0.04 K/uL (ref 0.00–0.07)
Basophils Absolute: 0.1 K/uL (ref 0.0–0.1)
Basophils Relative: 1 %
Eosinophils Absolute: 0.1 K/uL (ref 0.0–0.5)
Eosinophils Relative: 1 %
HCT: 32.7 % — ABNORMAL LOW (ref 36.0–46.0)
Hemoglobin: 10.3 g/dL — ABNORMAL LOW (ref 12.0–15.0)
Immature Granulocytes: 0 %
Lymphocytes Relative: 23 %
Lymphs Abs: 2.7 K/uL (ref 0.7–4.0)
MCH: 25.2 pg — ABNORMAL LOW (ref 26.0–34.0)
MCHC: 31.5 g/dL (ref 30.0–36.0)
MCV: 80.1 fL (ref 80.0–100.0)
Monocytes Absolute: 1 K/uL (ref 0.1–1.0)
Monocytes Relative: 8 %
Neutro Abs: 7.9 K/uL — ABNORMAL HIGH (ref 1.7–7.7)
Neutrophils Relative %: 67 %
Platelets: 482 K/uL — ABNORMAL HIGH (ref 150–400)
RBC: 4.08 MIL/uL (ref 3.87–5.11)
RDW: 14.4 % (ref 11.5–15.5)
WBC: 11.8 K/uL — ABNORMAL HIGH (ref 4.0–10.5)
nRBC: 0 % (ref 0.0–0.2)

## 2024-05-23 LAB — ANAEROBIC CULTURE W GRAM STAIN: Gram Stain: NONE SEEN

## 2024-05-23 LAB — BASIC METABOLIC PANEL WITH GFR
Anion gap: 9 (ref 5–15)
BUN: 6 mg/dL (ref 6–20)
CO2: 23 mmol/L (ref 22–32)
Calcium: 8.7 mg/dL — ABNORMAL LOW (ref 8.9–10.3)
Chloride: 105 mmol/L (ref 98–111)
Creatinine, Ser: 0.61 mg/dL (ref 0.44–1.00)
GFR, Estimated: >60 mL/min (ref >60)
Glucose, Bld: 117 mg/dL — ABNORMAL HIGH (ref 70–99)
Potassium: 3.5 mmol/L (ref 3.5–5.1)
Sodium: 137 mmol/L (ref 135–145)

## 2024-05-23 LAB — GLUCOSE, CAPILLARY
Glucose-Capillary: 96 mg/dL (ref 70–99)
Glucose-Capillary: 96 mg/dL (ref 70–99)
Glucose-Capillary: 95 mg/dL (ref 70–99)

## 2024-05-23 LAB — HEMOGLOBIN AND HEMATOCRIT, BLOOD
HCT: 33.8 % — ABNORMAL LOW (ref 36.0–46.0)
Hemoglobin: 10.6 g/dL — ABNORMAL LOW (ref 12.0–15.0)

## 2024-05-23 LAB — LIPASE, BLOOD: Lipase: 110 U/L — ABNORMAL HIGH (ref 11–51)

## 2024-05-23 LAB — CYTOLOGY - NON PAP

## 2024-05-23 MED ORDER — SODIUM CHLORIDE 0.9 % IV SOLN: INTRAVENOUS | Status: DC

## 2024-05-23 MED ORDER — PANTOPRAZOLE SODIUM 40 MG PO TBEC
40.0000 mg | DELAYED_RELEASE_TABLET | Freq: Every day | ORAL | Status: DC
  Administered 2024-05-23 – 2024-06-04 (×11): 40 mg via ORAL
  Filled 2024-05-23 (×12): qty 1

## 2024-05-23 MED ORDER — OXYCODONE HCL 5 MG PO TABS
7.5000 mg | ORAL_TABLET | ORAL | Status: DC | PRN
  Filled 2024-05-23: qty 2

## 2024-05-23 MED ORDER — OXYCODONE HCL 5 MG PO TABS
5.0000 mg | ORAL_TABLET | ORAL | Status: DC | PRN
  Administered 2024-05-24: 5 mg via ORAL
  Filled 2024-05-23: qty 1

## 2024-05-23 MED ORDER — HYDROMORPHONE HCL 1 MG/ML IJ SOLN
1.0000 mg | INTRAMUSCULAR | Status: DC | PRN
  Administered 2024-05-23 – 2024-05-26 (×14): 1 mg via INTRAVENOUS
  Filled 2024-05-23 (×14): qty 1

## 2024-05-23 NOTE — Progress Notes (Addendum)
 Welcome GI Progress Note  Chief Complaint: Upper abdominal pain and pancreatic pseudocyst  History:  Her pain is worse today after she tried a full liquid diet last evening.  She also feels that her current pain control regimen is not sufficient. No fever since I saw her yesterday.  No vomiting  ROS: Cardiovascular: No chest pain Respiratory: No cough or shortness of breath, but upper abdominal pain is worse with deep breath Urinary: No dysuria or hematuria  Objective:   Current Facility-Administered Medications:    0.9 %  sodium chloride  infusion, , Intravenous, Continuous, Kerman, Vina HERO, NP   acetaminophen  (TYLENOL ) tablet 1,000 mg, 1,000 mg, Oral, Q6H PRN, Segars, Jonathan, MD   albuterol  (PROVENTIL ) (2.5 MG/3ML) 0.083% nebulizer solution 2.5 mg, 2.5 mg, Nebulization, Q4H PRN, Segars, Jonathan, MD   diphenhydrAMINE  (BENADRYL ) capsule 25 mg, 25 mg, Oral, Q6H PRN, Rashid, Farhan, MD, 25 mg at 05/23/24 1031   feeding supplement (ENSURE PLUS HIGH PROTEIN) liquid 237 mL, 237 mL, Oral, BID BM, Esterwood, Amy S, PA-C, 237 mL at 05/22/24 1455   HYDROmorphone  (DILAUDID ) injection 0.5 mg, 0.5 mg, Intravenous, Q3H PRN, Gonfa, Taye T, MD, 0.5 mg at 05/23/24 0831   insulin  aspart (novoLOG ) injection 0-9 Units, 0-9 Units, Subcutaneous, TID WC, Gonfa, Taye T, MD, 1 Units at 05/22/24 9175   labetalol  (NORMODYNE ) injection 10 mg, 10 mg, Intravenous, Q2H PRN, Gonfa, Taye T, MD, 10 mg at 05/22/24 1730   melatonin tablet 6 mg, 6 mg, Oral, QHS PRN, Segars, Jonathan, MD, 6 mg at 05/22/24 2302   nicotine  (NICODERM CQ  - dosed in mg/24 hours) patch 14 mg, 14 mg, Transdermal, Daily, Segars, Dorn, MD, 14 mg at 05/23/24 1031   nicotine  polacrilex (NICORETTE ) gum 2 mg, 2 mg, Oral, PRN, Segars, Jonathan, MD   ondansetron  (ZOFRAN ) injection 4 mg, 4 mg, Intravenous, Q6H PRN, Segars, Jonathan, MD, 4 mg at 05/23/24 9167   oxyCODONE  (Oxy IR/ROXICODONE ) immediate release tablet 2.5 mg, 2.5 mg, Oral, Q4H PRN,  2.5 mg at 05/23/24 1030 **OR** oxyCODONE  (Oxy IR/ROXICODONE ) immediate release tablet 5 mg, 5 mg, Oral, Q4H PRN, Segars, Jonathan, MD, 5 mg at 05/23/24 0427   pantoprazole  (PROTONIX ) EC tablet 40 mg, 40 mg, Oral, Q1200, Rashid, Farhan, MD   polyethylene glycol (MIRALAX  / GLYCOLAX ) packet 17 g, 17 g, Oral, Daily PRN, Segars, Jonathan, MD   sodium chloride  flush (NS) 0.9 % injection 3 mL, 3 mL, Intravenous, Q12H, Segars, Dorn, MD, 3 mL at 05/23/24 1032   sodium chloride        Vital signs in last 24 hrs: Vitals:   05/23/24 0422 05/23/24 0728  BP: (!) 158/97 (!) 166/107  Pulse: 96 95  Resp: 17 18  Temp: 98.1 F (36.7 C) 98.2 F (36.8 C)  SpO2: 95% 96%    Intake/Output Summary (Last 24 hours) at 05/23/2024 1153 Last data filed at 05/23/2024 0700 Gross per 24 hour  Intake 300 ml  Output 0 ml  Net 300 ml     Physical Exam  HEENT: sclera anicteric, oral mucosa without lesions Neck: supple, no thyromegaly, JVD or lymphadenopathy Cardiac: RRR without murmurs, S1S2 heard, no peripheral edema Pulm: clear to auscultation bilaterally, normal RR and effort noted Abdomen: soft, + upper abdominal tenderness (more so toward the left), with active bowel sounds of normal character. No guarding or palpable hepatosplenomegaly.  No distention Skin; warm and dry, no jaundice  Recent Labs:     Latest Ref Rng & Units 05/23/2024    5:35 AM 05/22/2024  5:00 PM 05/22/2024    4:06 AM  CBC  WBC 4.0 - 10.5 K/uL 11.8   9.3   Hemoglobin 12.0 - 15.0 g/dL 89.6  89.6  9.7   Hematocrit 36.0 - 46.0 % 32.7  32.9  31.5   Platelets 150 - 400 K/uL 482   473     Recent Labs  Lab 05/18/24 0823  INR 1.2      Latest Ref Rng & Units 05/23/2024    5:35 AM 05/21/2024    3:58 AM 05/20/2024    4:02 AM  CMP  Glucose 70 - 99 mg/dL 882  86  898   BUN 6 - 20 mg/dL 6  10  14    Creatinine 0.44 - 1.00 mg/dL 9.38  9.13  9.30   Sodium 135 - 145 mmol/L 137  136  134   Potassium 3.5 - 5.1 mmol/L 3.5  3.5  3.8   Chloride  98 - 111 mmol/L 105  103  105   CO2 22 - 32 mmol/L 23  24  20    Calcium 8.9 - 10.3 mg/dL 8.7  8.4  8.1   Total Protein 6.5 - 8.1 g/dL   5.4   Total Bilirubin 0.0 - 1.2 mg/dL   0.2   Alkaline Phos 38 - 126 U/L   56   AST 15 - 41 U/L   9   ALT 0 - 44 U/L   5      Radiologic studies:   Assessment & Plan  Assessment: Idiopathic pancreatitis occurred at outside hospital in May of this year  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  I had a conversation with Dr. Wilhelmenia about our options (EUS guided drainage, +/- cyst gastrostomy versus operative intervention).  Both are complex and have the risk, but endoscopic approach probably less so.  Nevertheless, bleeding risks from an endoscopic EUS guided cyst drainage are increased in this case due to the splenic involvement.  Surgical service has seen this patient at his request to be familiar with her and on backup in case any such serious procedural complication should occur requiring operative intervention. (Attending physician still to see the patient and can also then have further discussion with Dr. Wilhelmenia if needed)  I presented Clarissa with all this, described an EUS guided cyst drainage with the typical risks and benefits of bleeding, infection, pancreatitis, endoscopic perforation, cardiovascular risk of anesthesia and she was agreeable.  She will speak to Dr. Wilhelmenia tomorrow before the procedure (currently scheduled in the early afternoon), and he will discuss all this with her again as the advanced endoscopist performing the procedure.  Meanwhile, we will keep her on a clear liquid diet for today since she had more pain last evening after trying a full liquid diet, then n.p.o. after midnight for tomorrow's procedure.  (Preprocedural considerations: Platelets elevated and likely reactive to this inflammatory process, INR 1.2 on  05/18/2024  Lastly, I will message the hospitalist because this patient appears to need a higher dose and/or frequency of pain medicine.  45 minutes were spent on this encounter, including in depth chart review, independent review of results as outlined above, communicating results with the patient directly, face-to-face time with the patient, coordinating care, ordering studies and medications as appropriate, and documentation.  High degree of medical decision making  Katrina Lambert Brand III Office: [phone]

## 2024-05-23 NOTE — Consult Note (Addendum)
 Katrina Lambert Aug 27, 1986  [id number].    Requesting MD: Legrand, MD Chief Complaint/Reason for Consult: pancreatitis with pseudocyst  HPI:  Katrina Lambert is a 38 y/o F with MMP listed below who was re-admitted to the hospital 7/2 for complicated pancreatitis. She was initially diagnosed with idiopathic pancreatitis as well as LLL PNA 03/19/2023 at [facility]. She was admitted for supportive care and was discharged home 5/6. During that admission it was confirmed that the patient was s/p cholecystectomy (no gallstones), normal triglycerides and calcium, no new meds, no family history of pancreatitis or cancer. On 5/31 she presented to the ED due to cough, SOB, and back pain and was admitted to [facility] for possible pneumonia, left pleural effusion, and acute pancreatitis with interval development of multiple LUQ pseudocysts. The effusion required a chest tube by IR with improvement in respiratory symptoms, cytology with inflammation only. Patient completed 10 days of abx for HCAP. GI was consulted for pancreatitis and recommended supportive care while pseudocysts mature. CTAP was repeated on 6/10 which showed interval increase in size of LUQ collection, likely arising from pacnreatic tail, with septations. On 6/11 the patients pain was controlled, tolerating oral intake, and she was discharged home with plans for outpatient MRCP and GI follow up. Due to acute increase in epigastric pain along with worsening nausea, the patient re-presented to the hospital on 7/2. Imaging showed persistent pancreatitis with enlarging pancreatic tail pseudocyst abutting the stomach, spleen, left diaphragm; CT questioned invasion on the splenic capsule with abscess. She was admitted and started on IV antibiotics. 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 IR was consulted for aspiration of dominant collection which  confirmed sterile pseudocyst and antibiotics were stopped.   Advanced endoscopist, Dr. Wilhelmenia, has been asked to review patients case for endoscopic drainage of pseudocyst and, due to risk of bleeding from procedure/need for splenectomy, general surgery is asked to evaluate the patient.   The patient adds that the day before her pancreatitis/pneumonia diagnosis she was seen in the ED after a spider bite to her left upper thigh. This ultimately required incision and drainage by a surgeon 5/5 during her admission at [facility] in May and her leg has now healed.   Patient says cholecystectomy was over 10 years ago and she denies and perioperative complications.   ROS: Review of Systems  All other systems reviewed and are negative.   History reviewed. No pertinent family history.  Past Medical History:  Diagnosis Date   Anxiety    Arthritis    bilateral hips and knees   Asthma    Bipolar disorder (HCC)    Depression    Dyspnea    GERD (gastroesophageal reflux disease)    Headache    Hypertension    Neuromuscular disorder (HCC)    PONV (postoperative nausea and vomiting)    Pre-diabetes     Past Surgical History:  Procedure Laterality Date   ANTERIOR CERVICAL DECOMP/DISCECTOMY FUSION N/A 06/02/2021   Procedure: ANTERIOR CERVICAL DISCECTOMY FUSION CERVICAL FOUR-FIVE, CERVICAL FIVE-SIX, CERVICAL SIX-SEVEN WITH CERVICAL SIX CORPECTOMY;  Surgeon: Dawley, Lani BROCKS, DO;  Location: [facility] OR;  Service: Neurosurgery;  Laterality: N/A;   CESAREAN SECTION     x 2   CHOLECYSTECTOMY     POSTERIOR CERVICAL FUSION/FORAMINOTOMY N/A 06/06/2021   Procedure: CERVICAL FOUR-FIVE, CERVICAL FIVE-SIX, CERVICAL SIX-SEVEN, CERVICAL SEVEN-THORACIC ONE POSTERIOR CERVICAL INSTRUMENTATION AND FUSION WITH CERVICAL FOUR-CERVICAL  SEVEN LAMINECTOMY/DECOMPRESSION;  Surgeon: Dawley, Lani BROCKS, DO;  Location: [facility] OR;  Service: Neurosurgery;  Laterality: N/A;    Social History:  reports that she has been smoking  cigarettes. She has a 33 pack-year smoking history. She has never used smokeless tobacco. She reports that she does not currently use alcohol. She reports current drug use. Frequency: 7.00 times per week. Drug: Marijuana.  Allergies: No Known Allergies  Medications Prior to Admission  Medication Sig Dispense Refill   acetaminophen  (TYLENOL ) 500 MG tablet Take 500 mg by mouth every 6 (six) hours as needed for mild pain (pain score 1-3).     lisinopril  (ZESTRIL ) 10 MG tablet Take 10 mg by mouth daily.       Physical Exam: Blood pressure (!) 166/107, pulse 95, temperature 98.2 F (36.8 C), temperature source Oral, resp. rate 18, height 5' 5 (1.651 m), weight 108.9 kg, SpO2 96%. General: Pleasant white female laying on hospital bed, appears stated age, NAD. HEENT: head -normocephalic, atraumatic; Eyes: PERRLA, no conjunctival injection; anicteric sclerae Neck- Trachea is midline, no thyromegaly or JVD appreciated.  CV- RRR, normal S1/S2, no M/R/G Pulm- breathing is non-labored ORA Abd- soft, obese, there is upper abdominal fullness and mild tenderness without peritonitis, no palpable hernias or masses.  GU- deferred  MSK- UE/LE symmetrical, no cyanosis, clubbing, or edema. Neuro- CN II-XII grossly in tact, no paresthesias. Psych- Alert and Oriented x3 with appropriate affect Skin: warm and dry, no rashes or lesions   Results for orders placed or performed during the hospital encounter of 05/17/24 (from the past 48 hours)  Glucose, capillary     Status: None   Collection Time: 05/21/24 11:29 AM  Result Value Ref Range   Glucose-Capillary 74 70 - 99 mg/dL    Comment: Glucose reference range applies only to samples taken after fasting for at least 8 hours.  Hemoglobin and hematocrit, blood     Status: Abnormal   Collection Time: 05/21/24  4:10 PM  Result Value Ref Range   Hemoglobin 10.5 (L) 12.0 - 15.0 g/dL   HCT 66.7 (L) 63.9 - 53.9 %    Comment: Performed at [facility]  Lab, [address], [place], [place] [address]  Glucose, capillary     Status: None   Collection Time: 05/21/24  4:15 PM  Result Value Ref Range   Glucose-Capillary 82 70 - 99 mg/dL    Comment: Glucose reference range applies only to samples taken after fasting for at least 8 hours.  Glucose, capillary     Status: None   Collection Time: 05/21/24  8:15 PM  Result Value Ref Range   Glucose-Capillary 87 70 - 99 mg/dL    Comment: Glucose reference range applies only to samples taken after fasting for at least 8 hours.   Comment 1 Document in Chart   Glucose, capillary     Status: None   Collection Time: 05/22/24  2:10 AM  Result Value Ref Range   Glucose-Capillary 83 70 - 99 mg/dL    Comment: Glucose reference range applies only to samples taken after fasting for at least 8 hours.   Comment 1 Document in Chart   CBC     Status: Abnormal   Collection Time: 05/22/24  4:06 AM  Result Value Ref Range   WBC 9.3 4.0 - 10.5 K/uL   RBC 3.91 3.87 - 5.11 MIL/uL   Hemoglobin 9.7 (L) 12.0 - 15.0 g/dL   HCT 68.4 (L) 63.9 - 53.9 %  MCV 80.6 80.0 - 100.0 fL   MCH 24.8 (L) 26.0 - 34.0 pg   MCHC 30.8 30.0 - 36.0 g/dL   RDW 85.5 88.4 - 84.4 %   Platelets 473 (H) 150 - 400 K/uL   nRBC 0.0 0.0 - 0.2 %    Comment: Performed at [facility], [address] [place], [place] [address]  Glucose, capillary     Status: Abnormal   Collection Time: 05/22/24  7:50 AM  Result Value Ref Range   Glucose-Capillary 143 (H) 70 - 99 mg/dL    Comment: Glucose reference range applies only to samples taken after fasting for at least 8 hours.  Glucose, capillary     Status: None   Collection Time: 05/22/24 11:11 AM  Result Value Ref Range   Glucose-Capillary 85 70 - 99 mg/dL    Comment: Glucose reference range applies only to samples taken after fasting for at least 8 hours.  Hemoglobin and hematocrit, blood     Status: Abnormal   Collection Time: 05/22/24  5:00 PM  Result Value Ref Range   Hemoglobin 10.3 (L)  12.0 - 15.0 g/dL   HCT 67.0 (L) 63.9 - 53.9 %    Comment: Performed at [facility], [address], [place], [place] [address]  Glucose, capillary     Status: None   Collection Time: 05/22/24  5:10 PM  Result Value Ref Range   Glucose-Capillary 98 70 - 99 mg/dL    Comment: Glucose reference range applies only to samples taken after fasting for at least 8 hours.  Glucose, capillary     Status: Abnormal   Collection Time: 05/22/24  8:23 PM  Result Value Ref Range   Glucose-Capillary 109 (H) 70 - 99 mg/dL    Comment: Glucose reference range applies only to samples taken after fasting for at least 8 hours.  CBC with Differential/Platelet     Status: Abnormal   Collection Time: 05/23/24  5:35 AM  Result Value Ref Range   WBC 11.8 (H) 4.0 - 10.5 K/uL   RBC 4.08 3.87 - 5.11 MIL/uL   Hemoglobin 10.3 (L) 12.0 - 15.0 g/dL   HCT 67.2 (L) 63.9 - 53.9 %   MCV 80.1 80.0 - 100.0 fL   MCH 25.2 (L) 26.0 - 34.0 pg   MCHC 31.5 30.0 - 36.0 g/dL   RDW 85.5 88.4 - 84.4 %   Platelets 482 (H) 150 - 400 K/uL   nRBC 0.0 0.0 - 0.2 %   Neutrophils Relative % 67 %   Neutro Abs 7.9 (H) 1.7 - 7.7 K/uL   Lymphocytes Relative 23 %   Lymphs Abs 2.7 0.7 - 4.0 K/uL   Monocytes Relative 8 %   Monocytes Absolute 1.0 0.1 - 1.0 K/uL   Eosinophils Relative 1 %   Eosinophils Absolute 0.1 0.0 - 0.5 K/uL   Basophils Relative 1 %   Basophils Absolute 0.1 0.0 - 0.1 K/uL   Immature Granulocytes 0 %   Abs Immature Granulocytes 0.04 0.00 - 0.07 K/uL    Comment: Performed at [facility], [address], [place], [place] [address]  Lipase, blood     Status: Abnormal   Collection Time: 05/23/24  5:35 AM  Result Value Ref Range   Lipase 110 (H) 11 - 51 U/L    Comment: Performed at [facility], [address], [place], [place] [address]  Basic metabolic panel     Status: Abnormal  Collection Time: 05/23/24  5:35 AM  Result Value Ref Range   Sodium 137 135 - 145 mmol/L   Potassium 3.5 3.5 - 5.1 mmol/L    Chloride 105 98 - 111 mmol/L   CO2 23 22 - 32 mmol/L   Glucose, Bld 117 (H) 70 - 99 mg/dL    Comment: Glucose reference range applies only to samples taken after fasting for at least 8 hours.   BUN 6 6 - 20 mg/dL   Creatinine, Ser 9.38 0.44 - 1.00 mg/dL   Calcium 8.7 (L) 8.9 - 10.3 mg/dL   GFR, Estimated >39 >39 mL/min    Comment: (NOTE) Calculated using the CKD-EPI Creatinine Equation (2021)    Anion gap 9 5 - 15    Comment: Performed at [facility], [address] [place], [place] [address]  Glucose, capillary     Status: None   Collection Time: 05/23/24  7:29 AM  Result Value Ref Range   Glucose-Capillary 96 70 - 99 mg/dL    Comment: Glucose reference range applies only to samples taken after fasting for at least 8 hours.   CT ABDOMEN PELVIS W CONTRAST Result Date: 05/21/2024 CLINICAL DATA:  38 year old female with history of pancreatic pseudocyst status post image guided aspiration on 05/18/2024. EXAM: CT ABDOMEN AND PELVIS WITH CONTRAST TECHNIQUE: Multidetector CT imaging of the abdomen and pelvis was performed using the standard protocol following bolus administration of intravenous contrast. RADIATION DOSE REDUCTION: This exam was performed according to the departmental dose-optimization program which includes automated exposure control, adjustment of the mA and/or kV according to patient size and/or use of iterative reconstruction technique. CONTRAST:  75mL OMNIPAQUE  IOHEXOL  350 MG/ML SOLN COMPARISON:  05/17/2024 FINDINGS: Lower chest: Similar appearing small left pleural effusion with associated left basilar passive atelectasis. Hepatobiliary: No focal liver abnormality is seen. Status post cholecystectomy. No biliary dilatation. Pancreas: Similar normal appearing head and body of the pancreas with large multiloculated cystic lesion extending from the pancreatic tail superiorly to the greater curvature of the stomach and perisplenic regions. The collection is overall similar to  most recent comparison CT. Spleen: Splenic parenchyma is normal in size enhancement. Again seen is a large subcapsular appearing splenic cystic lesion with peripheral enhancement arising from the superior aspect which measures up to approximately 13.6 x 8.0 cm in maximum axial dimensions. Adrenals/Urinary Tract: Adrenal glands are unremarkable. Kidneys are normal, without renal calculi, focal lesion, or hydronephrosis. Bladder is unremarkable. Stomach/Bowel: Stomach is within normal limits, nearly collapsed extrinsic compression from neighboring pancreatic cysts. Appendix appears normal. No evidence of bowel wall thickening, distention, or inflammatory changes. Vascular/Lymphatic: No significant vascular findings are present. No enlarged abdominal or pelvic lymph nodes. Reproductive: Intrauterine device in place in unchanged position. Uterus and bilateral adnexa are otherwise unremarkable. Other: Interval development of crescentic small volume ascites in the left upper quadrant. Musculoskeletal: No acute osseous abnormality. Similar appearing advanced discogenic degenerative change at L3-L4 with unchanged anterolisthesis at this level. IMPRESSION: 1. Similar appearing large multiloculated cystic lesion extending from the pancreatic tail superiorly to the greater curvature of the stomach and perisplenic regions. The collection is overall similar to most recent comparison CT. 2. Interval development of crescentic small volume ascites in the left upper quadrant. Suspect pancreatic pseudocyst contents after recent instrumentation. 3. Similar appearing small left pleural effusion with associated left basilar passive atelectasis. Ester Sides, MD [facility] Electronically Signed   By: Ester Sides M.D.   On: 05/21/2024 21:23  Assessment/Plan Idiopathic pancreatitis with large pseudocyst  - cause of pancreatitis remains unclear: no gallstones/gallbladder  surgically absent, TG 111 on 5/3, not on any medications that cause pancreatitis, while uncommon it is possible the spider bite caused this.  - s/p CT guided aspiration 7/3, Cx No organisms - GI is following and we will await Dr. Melba recommendations/procedural plans. Will plan to have my attending speak with him directly about any planned endoscopic procedures and risks of the procedure. Surgical management of bleeding from peripancreatic vessels or spleen would be an emergency and surgery would likely be difficult and include splenectomy.   FEN - CLD, NPO after MN for possible endoscopic procedure by GI VTE - SCDs, no DVT ppx at present ID - No abx at present  Admit - TRH service, GI consulting   HTN DM2 - A1c 7.3% Tobacco abuse  Asthma  Iron -deficiency anemia  Obesity  PCOS    I reviewed nursing notes, ED provider notes, hospitalist notes, last 24 h vitals and pain scores, last 48 h intake and output, last 24 h labs and trends, and last 24 h imaging results.  Almarie GORMAN Pringle, PA-C [facility] 05/23/2024, 10:08 AM Please see Amion for pager number during day hours 7:00am-4:30pm or 7:00am -11:30am on weekends

## 2024-05-23 NOTE — H&P (View-Only) (Signed)
 Welcome GI Progress Note  Chief Complaint: Upper abdominal pain and pancreatic pseudocyst  History:  Her pain is worse today after she tried a full liquid diet last evening.  She also feels that her current pain control regimen is not sufficient. No fever since I saw her yesterday.  No vomiting  ROS: Cardiovascular: No chest pain Respiratory: No cough or shortness of breath, but upper abdominal pain is worse with deep breath Urinary: No dysuria or hematuria  Objective:   Current Facility-Administered Medications:    0.9 %  sodium chloride  infusion, , Intravenous, Continuous, Kerman, Vina HERO, NP   acetaminophen  (TYLENOL ) tablet 1,000 mg, 1,000 mg, Oral, Q6H PRN, Segars, Jonathan, MD   albuterol  (PROVENTIL ) (2.5 MG/3ML) 0.083% nebulizer solution 2.5 mg, 2.5 mg, Nebulization, Q4H PRN, Segars, Jonathan, MD   diphenhydrAMINE  (BENADRYL ) capsule 25 mg, 25 mg, Oral, Q6H PRN, Rashid, Farhan, MD, 25 mg at 05/23/24 1031   feeding supplement (ENSURE PLUS HIGH PROTEIN) liquid 237 mL, 237 mL, Oral, BID BM, Esterwood, Amy S, PA-C, 237 mL at 05/22/24 1455   HYDROmorphone  (DILAUDID ) injection 0.5 mg, 0.5 mg, Intravenous, Q3H PRN, Gonfa, Taye T, MD, 0.5 mg at 05/23/24 0831   insulin  aspart (novoLOG ) injection 0-9 Units, 0-9 Units, Subcutaneous, TID WC, Gonfa, Taye T, MD, 1 Units at 05/22/24 9175   labetalol  (NORMODYNE ) injection 10 mg, 10 mg, Intravenous, Q2H PRN, Gonfa, Taye T, MD, 10 mg at 05/22/24 1730   melatonin tablet 6 mg, 6 mg, Oral, QHS PRN, Segars, Jonathan, MD, 6 mg at 05/22/24 2302   nicotine  (NICODERM CQ  - dosed in mg/24 hours) patch 14 mg, 14 mg, Transdermal, Daily, Segars, Dorn, MD, 14 mg at 05/23/24 1031   nicotine  polacrilex (NICORETTE ) gum 2 mg, 2 mg, Oral, PRN, Segars, Jonathan, MD   ondansetron  (ZOFRAN ) injection 4 mg, 4 mg, Intravenous, Q6H PRN, Segars, Jonathan, MD, 4 mg at 05/23/24 9167   oxyCODONE  (Oxy IR/ROXICODONE ) immediate release tablet 2.5 mg, 2.5 mg, Oral, Q4H PRN,  2.5 mg at 05/23/24 1030 **OR** oxyCODONE  (Oxy IR/ROXICODONE ) immediate release tablet 5 mg, 5 mg, Oral, Q4H PRN, Segars, Jonathan, MD, 5 mg at 05/23/24 0427   pantoprazole  (PROTONIX ) EC tablet 40 mg, 40 mg, Oral, Q1200, Rashid, Farhan, MD   polyethylene glycol (MIRALAX  / GLYCOLAX ) packet 17 g, 17 g, Oral, Daily PRN, Segars, Jonathan, MD   sodium chloride  flush (NS) 0.9 % injection 3 mL, 3 mL, Intravenous, Q12H, Segars, Dorn, MD, 3 mL at 05/23/24 1032   sodium chloride        Vital signs in last 24 hrs: Vitals:   05/23/24 0422 05/23/24 0728  BP: (!) 158/97 (!) 166/107  Pulse: 96 95  Resp: 17 18  Temp: 98.1 F (36.7 C) 98.2 F (36.8 C)  SpO2: 95% 96%    Intake/Output Summary (Last 24 hours) at 05/23/2024 1153 Last data filed at 05/23/2024 0700 Gross per 24 hour  Intake 300 ml  Output 0 ml  Net 300 ml     Physical Exam  HEENT: sclera anicteric, oral mucosa without lesions Neck: supple, no thyromegaly, JVD or lymphadenopathy Cardiac: RRR without murmurs, S1S2 heard, no peripheral edema Pulm: clear to auscultation bilaterally, normal RR and effort noted Abdomen: soft, + upper abdominal tenderness (more so toward the left), with active bowel sounds of normal character. No guarding or palpable hepatosplenomegaly.  No distention Skin; warm and dry, no jaundice  Recent Labs:     Latest Ref Rng & Units 05/23/2024    5:35 AM 05/22/2024  5:00 PM 05/22/2024    4:06 AM  CBC  WBC 4.0 - 10.5 K/uL 11.8   9.3   Hemoglobin 12.0 - 15.0 g/dL 89.6  89.6  9.7   Hematocrit 36.0 - 46.0 % 32.7  32.9  31.5   Platelets 150 - 400 K/uL 482   473     Recent Labs  Lab 05/18/24 0823  INR 1.2      Latest Ref Rng & Units 05/23/2024    5:35 AM 05/21/2024    3:58 AM 05/20/2024    4:02 AM  CMP  Glucose 70 - 99 mg/dL 882  86  898   BUN 6 - 20 mg/dL 6  10  14    Creatinine 0.44 - 1.00 mg/dL 9.38  9.13  9.30   Sodium 135 - 145 mmol/L 137  136  134   Potassium 3.5 - 5.1 mmol/L 3.5  3.5  3.8   Chloride  98 - 111 mmol/L 105  103  105   CO2 22 - 32 mmol/L 23  24  20    Calcium 8.9 - 10.3 mg/dL 8.7  8.4  8.1   Total Protein 6.5 - 8.1 g/dL   5.4   Total Bilirubin 0.0 - 1.2 mg/dL   0.2   Alkaline Phos 38 - 126 U/L   56   AST 15 - 41 U/L   9   ALT 0 - 44 U/L   5      Radiologic studies:   Assessment & Plan  Assessment: Idiopathic pancreatitis occurred at outside hospital in May of this year  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  I had a conversation with Dr. Wilhelmenia about our options (EUS guided drainage, +/- cyst gastrostomy versus operative intervention).  Both are complex and have the risk, but endoscopic approach probably less so.  Nevertheless, bleeding risks from an endoscopic EUS guided cyst drainage are increased in this case due to the splenic involvement.  Surgical service has seen this patient at his request to be familiar with her and on backup in case any such serious procedural complication should occur requiring operative intervention. (Attending physician still to see the patient and can also then have further discussion with Dr. Wilhelmenia if needed)  I presented Katrina Lambert with all this, described an EUS guided cyst drainage with the typical risks and benefits of bleeding, infection, pancreatitis, endoscopic perforation, cardiovascular risk of anesthesia and she was agreeable.  She will speak to Dr. Wilhelmenia tomorrow before the procedure (currently scheduled in the early afternoon), and he will discuss all this with her again as the advanced endoscopist performing the procedure.  Meanwhile, we will keep her on a clear liquid diet for today since she had more pain last evening after trying a full liquid diet, then n.p.o. after midnight for tomorrow's procedure.  (Preprocedural considerations: Platelets elevated and likely reactive to this inflammatory process, INR 1.2 on  05/18/2024  Lastly, I will message the hospitalist because this patient appears to need a higher dose and/or frequency of pain medicine.  45 minutes were spent on this encounter, including in depth chart review, independent review of results as outlined above, communicating results with the patient directly, face-to-face time with the patient, coordinating care, ordering studies and medications as appropriate, and documentation.  High degree of medical decision making  Victory LITTIE Brand III Office: [phone]

## 2024-05-23 NOTE — Progress Notes (Signed)
 PROGRESS NOTE    Katrina Lambert  FMW:[id number] DOB: 1986-02-15 DOA: 05/17/2024 PCP: [facility]   Brief Narrative:  38 year old F with PMH of DM-2, HTN, asthma, IDA, morbid obesity, B12 deficiency, cervical myelopathy, tobacco use disorder and recent hospitalization at [facility] from 5/3-5/6 for idiopathic pancreatitis and at [facility] 5/31/611 for recurrent pancreatitis with pseudocyst as well as hospital-acquired pneumonia complicated by parapneumonic effusion requiring chest tube placement returning to ED with worsening intermittent abdominal pain, nausea and vomiting since her discharge from the hospital. Being managed for abdominal pain in the setting of pancreatic pseudocyst, s/p percutaneous cyst drainage.   Assessment & Plan:  Principal Problem:   Pancreatic abscess Active Problems:   Acute pancreatitis   Pleural effusion on left   Left upper quadrant pain   Nausea in adult   Encounter for smoking cessation counseling   Acute pancreatic fluid collection   Acute on chronic pancreatitis with enlarging pancreatic pseudocyst-: EUS guided cyst drainage to be done on 7/9. -S/p CT-guided diagnostic aspiration of peripancreatic fluid yielding 100 cc of brown fluid -Single set blood culture and aspirate culture NGTD. -Cefepime  and Flagyl  7/2-7/6 -Oxycodone  for moderate to severe pain and IV Dilaudid  for breakthrough pain - Continue PPI and antiemetics -Etiology of pancreatitis unclear. She did have a spider bite before all of this started. - I discussed the case with Dr Legrand, GI and plan is to do EUS cyst drainage tomorrow. -prn analgesics. Pain regimen adjusted on 7/8.  SIRS with tachycardia and leukocytosis: HDS.  Leukocytosis resolved.  CRP elevated to 19.6.  Lactic acidosis: Mild and resolved.   NIDDM-2 with hyperglycemia: A1c 7.3%.  Does not seem to be on medication at home. Last Labs         Recent Labs  Lab 05/20/24 2011 05/20/24 2356 05/21/24 0412 05/21/24 0759  05/21/24 1129  GLUCAP 121* 83 82 102* 74    - SSI-sensitive ACHS   Hypokalemia/hypomagnesemia: replete prn   Tobacco use disorder -Counseled extensively regarding tobacco cessation. -Nicotine  patch   Essential hypertension: prn antihypertensives   Hyponatremia: Resolved. -Monitor   Asthma: Albuterol  as needed  IDA: Stable.  Continue monitoring.  B12: Continue B12   Cervical myelopathy: Noted  PCOS: Out patient follow up.  Mood disorder: Stable.  Not on medication   Class III obesity Counseled extensively regarding dietary modifications and exercise to facilitate weight loss.   DVT prophylaxis: SCDs Start: 05/17/24 2001     Code Status: Full Code Family Communication:   Status is: Inpatient Remains inpatient appropriate because: pancreatitis, pseudocyst    Subjective:  Patient is complaining of worsening left upper quadrant pain this morning.  She said that her diet was advanced to mechanical soft diet yesterday and she did not tolerate it.  She is requesting to switch her mechanical soft diet back to a clear liquid diet.  Gastroenterology on board  Examination:  General exam: She is in mild distress secondary to abdominal pain, alert and oriented Respiratory system: Clear to auscultation. Respiratory effort normal. Cardiovascular system: S1 & S2 heard, RRR. No JVD, murmurs, rubs, gallops or clicks. No pedal edema. Gastrointestinal system: Abdomen is nondistended, soft. Slight tenderness to palpation over LUQ. No organomegaly or masses felt. Normal bowel sounds heard. Central nervous system: Alert and oriented. No focal neurological deficits. Extremities: Symmetric 5 x 5 power. Skin: No rashes, lesions or ulcers Psychiatry: Judgement and insight appear normal. Mood & affect appropriate.      Diet Orders (From admission, onward)  Start     Ordered   05/24/24 0001  Diet NPO time specified Except for: Sips with Meds  Diet effective midnight        Question:  Except for  Answer:  Sips with Meds   05/23/24 9081   05/23/24 0843  Diet clear liquid Room service appropriate? Yes; Fluid consistency: Thin  Diet effective now       Question Answer Comment  Room service appropriate? Yes   Fluid consistency: Thin      05/23/24 0842            Objective: Vitals:   05/22/24 1858 05/22/24 2024 05/23/24 0422 05/23/24 0728  BP: (!) 144/86 (!) 161/106 (!) 158/97 (!) 166/107  Pulse: (!) 104 99 96 95  Resp: 16 20 17 18   Temp:  98 F (36.7 C) 98.1 F (36.7 C) 98.2 F (36.8 C)  TempSrc:  Oral Oral Oral  SpO2:  97% 95% 96%  Weight:      Height:        Intake/Output Summary (Last 24 hours) at 05/23/2024 0953 Last data filed at 05/23/2024 0700 Gross per 24 hour  Intake 600 ml  Output 0 ml  Net 600 ml   Filed Weights   05/17/24 1259 05/18/24 0800  Weight: 108.9 kg 108.9 kg    Scheduled Meds:  feeding supplement  237 mL Oral BID BM   insulin  aspart  0-9 Units Subcutaneous TID WC   nicotine   14 mg Transdermal Daily   pantoprazole   40 mg Oral Q1200   sodium chloride  flush  3 mL Intravenous Q12H   Continuous Infusions:  Nutritional status     Body mass index is 39.95 kg/m.  Data Reviewed:   CBC: Recent Labs  Lab 05/18/24 1644 05/19/24 0428 05/19/24 1641 05/20/24 0402 05/20/24 1721 05/21/24 0358 05/21/24 1610 05/22/24 0406 05/22/24 1700 05/23/24 0535  WBC 15.6* 12.5*  --  12.9*  --  10.0  --  9.3  --  11.8*  NEUTROABS 13.4* 9.8*  --   --   --  6.2  --   --   --  7.9*  HGB 12.6 11.9*   < > 10.7*   < > 9.7* 10.5* 9.7* 10.3* 10.3*  HCT 40.7 38.2   < > 33.5*   < > 31.2* 33.2* 31.5* 32.9* 32.7*  MCV 82.2 80.6  --  82.1  --  80.6  --  80.6  --  80.1  PLT 619* 504*  --  433*  --  438*  --  473*  --  482*   < > = values in this interval not displayed.   Basic Metabolic Panel: Recent Labs  Lab 05/18/24 0329 05/19/24 0428 05/20/24 0402 05/21/24 0358 05/23/24 0535  NA 134* 133* 134* 136 137  K 3.5 3.9 3.8 3.5 3.5   CL 101 103 105 103 105  CO2 24 22 20* 24 23  GLUCOSE 106* 118* 101* 86 117*  BUN 7 12 14 10 6   CREATININE 0.74 0.79 0.69 0.86 0.61  CALCIUM 8.5* 8.3* 8.1* 8.4* 8.7*  MG 1.6* 1.8 1.7  --   --   PHOS 4.6  --   --   --   --    GFR: Estimated Creatinine Clearance: 118.3 mL/min (by C-G formula based on SCr of 0.61 mg/dL). Liver Function Tests: Recent Labs  Lab 05/17/24 1440 05/19/24 0428 05/20/24 0402  AST 15 7* 9*  ALT 13 8 5   ALKPHOS 80 65 56  BILITOT 0.8 0.8 0.2  PROT 7.2 5.6* 5.4*  ALBUMIN 2.8* 2.1* 1.9*   Recent Labs  Lab 05/17/24 1440 05/19/24 0428 05/20/24 0402 05/23/24 0535  LIPASE 940* 79* 79* 110*   No results for input(s): AMMONIA in the last 168 hours. Coagulation Profile: Recent Labs  Lab 05/18/24 0823  INR 1.2   Cardiac Enzymes: No results for input(s): CKTOTAL, CKMB, CKMBINDEX, TROPONINI in the last 168 hours. BNP (last 3 results) No results for input(s): PROBNP in the last 8760 hours. HbA1C: No results for input(s): HGBA1C in the last 72 hours. CBG: Recent Labs  Lab 05/22/24 0750 05/22/24 1111 05/22/24 1710 05/22/24 2023 05/23/24 0729  GLUCAP 143* 85 98 109* 96   Lipid Profile: No results for input(s): CHOL, HDL, LDLCALC, TRIG, CHOLHDL, LDLDIRECT in the last 72 hours. Thyroid Function Tests: No results for input(s): TSH, T4TOTAL, FREET4, T3FREE, THYROIDAB in the last 72 hours. Anemia Panel: No results for input(s): VITAMINB12, FOLATE, FERRITIN, TIBC, IRON , RETICCTPCT in the last 72 hours. Sepsis Labs: Recent Labs  Lab 05/17/24 1645 05/17/24 1823 05/17/24 2230  LATICACIDVEN 0.8 2.1* 0.8    Recent Results (from the past 240 hours)  Culture, blood (single)     Status: None   Collection Time: 05/17/24  7:45 PM   Specimen: BLOOD  Result Value Ref Range Status   Specimen Description BLOOD SITE NOT SPECIFIED  Final   Special Requests   Final    BOTTLES DRAWN AEROBIC AND ANAEROBIC Blood  Culture results may not be optimal due to an inadequate volume of blood received in culture bottles   Culture   Final    NO GROWTH 5 DAYS Performed at [facility], [address] [place], [place] [address]    Report Status 05/22/2024 FINAL  Final  Body fluid culture w Gram Stain     Status: None   Collection Time: 05/18/24  1:10 PM   Specimen: Body Fluid  Result Value Ref Range Status   Specimen Description FLUID  Final   Special Requests PERI PANCREATIC  Final   Gram Stain NO WBC SEEN NO ORGANISMS SEEN   Final   Culture   Final    NO GROWTH 3 DAYS Performed at [facility], [address] [place], [place] [address]    Report Status 05/21/2024 FINAL  Final  Anaerobic culture w Gram Stain     Status: None (Preliminary result)   Collection Time: 05/18/24  1:10 PM   Specimen: Fluid  Result Value Ref Range Status   Specimen Description FLUID  Final   Special Requests PERI PANCREATIC  Final   Gram Stain   Final    NO WBC SEEN NO ORGANISMS SEEN Performed at [facility], [address] [place], [place] [address]    Culture   Final    NO ANAEROBES ISOLATED; CULTURE IN PROGRESS FOR 5 DAYS   Report Status PENDING  Incomplete         Radiology Studies: CT ABDOMEN PELVIS W CONTRAST Result Date: 05/21/2024 CLINICAL DATA:  38 year old female with history of pancreatic pseudocyst status post image guided aspiration on 05/18/2024. EXAM: CT ABDOMEN AND PELVIS WITH CONTRAST TECHNIQUE: Multidetector CT imaging of the abdomen and pelvis was performed using the standard protocol following bolus administration of intravenous contrast. RADIATION DOSE REDUCTION: This exam was performed according to the departmental dose-optimization program which includes automated exposure control, adjustment of the mA and/or kV according to patient size and/or use of iterative  reconstruction technique. CONTRAST:  75mL OMNIPAQUE  IOHEXOL  350 MG/ML SOLN COMPARISON:  05/17/2024 FINDINGS: Lower  chest: Similar appearing small left pleural effusion with associated left basilar passive atelectasis. Hepatobiliary: No focal liver abnormality is seen. Status post cholecystectomy. No biliary dilatation. Pancreas: Similar normal appearing head and body of the pancreas with large multiloculated cystic lesion extending from the pancreatic tail superiorly to the greater curvature of the stomach and perisplenic regions. The collection is overall similar to most recent comparison CT. Spleen: Splenic parenchyma is normal in size enhancement. Again seen is a large subcapsular appearing splenic cystic lesion with peripheral enhancement arising from the superior aspect which measures up to approximately 13.6 x 8.0 cm in maximum axial dimensions. Adrenals/Urinary Tract: Adrenal glands are unremarkable. Kidneys are normal, without renal calculi, focal lesion, or hydronephrosis. Bladder is unremarkable. Stomach/Bowel: Stomach is within normal limits, nearly collapsed extrinsic compression from neighboring pancreatic cysts. Appendix appears normal. No evidence of bowel wall thickening, distention, or inflammatory changes. Vascular/Lymphatic: No significant vascular findings are present. No enlarged abdominal or pelvic lymph nodes. Reproductive: Intrauterine device in place in unchanged position. Uterus and bilateral adnexa are otherwise unremarkable. Other: Interval development of crescentic small volume ascites in the left upper quadrant. Musculoskeletal: No acute osseous abnormality. Similar appearing advanced discogenic degenerative change at L3-L4 with unchanged anterolisthesis at this level. IMPRESSION: 1. Similar appearing large multiloculated cystic lesion extending from the pancreatic tail superiorly to the greater curvature of the stomach and perisplenic regions. The collection is overall similar to most recent comparison CT. 2. Interval development of crescentic small volume ascites in the left upper quadrant.  Suspect pancreatic pseudocyst contents after recent instrumentation. 3. Similar appearing small left pleural effusion with associated left basilar passive atelectasis. Ester Sides, MD [facility] Radiology Electronically Signed   By: Ester Sides M.D.   On: 05/21/2024 21:23   Patient is calm     LOS: 6 days   Time spent= 40 mins    Deliliah Room, MD [facility]  If 7PM-7AM, please contact night-coverage  05/23/2024, 9:53 AM

## 2024-05-24 ENCOUNTER — Inpatient Hospital Stay (HOSPITAL_COMMUNITY): Payer: MEDICAID | Admitting: Anesthesiology

## 2024-05-24 ENCOUNTER — Encounter (HOSPITAL_COMMUNITY): Payer: Self-pay | Admitting: Internal Medicine

## 2024-05-24 ENCOUNTER — Ambulatory Visit: Payer: Self-pay | Admitting: Gastroenterology

## 2024-05-24 ENCOUNTER — Encounter (HOSPITAL_COMMUNITY)
Admission: EM | Disposition: A | Discharge status: Other Acute Inpatient Hospital | Payer: Self-pay | Source: Home / Self Care | Attending: Internal Medicine

## 2024-05-24 DIAGNOSIS — K838 Other specified diseases of biliary tract: Secondary | ICD-10-CM

## 2024-05-24 DIAGNOSIS — K859 Acute pancreatitis without necrosis or infection, unspecified: Secondary | ICD-10-CM | POA: Diagnosis not present

## 2024-05-24 DIAGNOSIS — I10 Essential (primary) hypertension: Secondary | ICD-10-CM

## 2024-05-24 DIAGNOSIS — K8500 Idiopathic acute pancreatitis without necrosis or infection: Secondary | ICD-10-CM | POA: Diagnosis not present

## 2024-05-24 DIAGNOSIS — F17210 Nicotine dependence, cigarettes, uncomplicated: Secondary | ICD-10-CM

## 2024-05-24 DIAGNOSIS — K862 Cyst of pancreas: Secondary | ICD-10-CM

## 2024-05-24 DIAGNOSIS — E440 Moderate protein-calorie malnutrition: Secondary | ICD-10-CM | POA: Insufficient documentation

## 2024-05-24 DIAGNOSIS — K2289 Other specified disease of esophagus: Secondary | ICD-10-CM

## 2024-05-24 DIAGNOSIS — K869 Disease of pancreas, unspecified: Secondary | ICD-10-CM

## 2024-05-24 DIAGNOSIS — K863 Pseudocyst of pancreas: Secondary | ICD-10-CM | POA: Diagnosis not present

## 2024-05-24 DIAGNOSIS — K8689 Other specified diseases of pancreas: Secondary | ICD-10-CM

## 2024-05-24 DIAGNOSIS — I899 Noninfective disorder of lymphatic vessels and lymph nodes, unspecified: Secondary | ICD-10-CM

## 2024-05-24 DIAGNOSIS — K3189 Other diseases of stomach and duodenum: Secondary | ICD-10-CM | POA: Diagnosis not present

## 2024-05-24 LAB — GLUCOSE, CAPILLARY
Glucose-Capillary: 103 mg/dL — ABNORMAL HIGH (ref 70–99)
Glucose-Capillary: 94 mg/dL (ref 70–99)
Glucose-Capillary: 112 mg/dL — ABNORMAL HIGH (ref 70–99)
Glucose-Capillary: 164 mg/dL — ABNORMAL HIGH (ref 70–99)

## 2024-05-24 LAB — HEMOGLOBIN AND HEMATOCRIT, BLOOD
HCT: 32.6 % — ABNORMAL LOW (ref 36.0–46.0)
Hemoglobin: 10.3 g/dL — ABNORMAL LOW (ref 12.0–15.0)

## 2024-05-24 SURGERY — ULTRASOUND, UPPER GI TRACT, ENDOSCOPIC
Anesthesia: General

## 2024-05-24 MED ORDER — CIPROFLOXACIN IN D5W 400 MG/200ML IV SOLN
INTRAVENOUS | Status: DC | PRN
  Administered 2024-05-24: 400 mg via INTRAVENOUS

## 2024-05-24 MED ORDER — PROPOFOL 10 MG/ML IV BOLUS
INTRAVENOUS | Status: AC
  Filled 2024-05-24: qty 20

## 2024-05-24 MED ORDER — PROPOFOL 1000 MG/100ML IV EMUL
INTRAVENOUS | Status: AC
  Filled 2024-05-24: qty 100

## 2024-05-24 MED ORDER — CIPROFLOXACIN HCL 500 MG PO TABS
500.0000 mg | ORAL_TABLET | Freq: Two times a day (BID) | ORAL | Status: AC
  Administered 2024-05-24 – 2024-05-27 (×6): 500 mg via ORAL
  Filled 2024-05-24 (×6): qty 1

## 2024-05-24 MED ORDER — ADULT MULTIVITAMIN W/MINERALS CH
1.0000 | ORAL_TABLET | Freq: Every day | ORAL | Status: DC
  Administered 2024-05-25 – 2024-06-04 (×9): 1 via ORAL
  Filled 2024-05-24 (×11): qty 1

## 2024-05-24 MED ORDER — THIAMINE MONONITRATE 100 MG PO TABS
100.0000 mg | ORAL_TABLET | Freq: Every day | ORAL | Status: AC
  Administered 2024-05-25 – 2024-05-29 (×5): 100 mg via ORAL
  Filled 2024-05-24 (×6): qty 1

## 2024-05-24 MED ORDER — FENTANYL CITRATE (PF) 250 MCG/5ML IJ SOLN
INTRAMUSCULAR | Status: DC | PRN
  Administered 2024-05-24: 100 ug via INTRAVENOUS

## 2024-05-24 MED ORDER — MIDAZOLAM HCL 2 MG/2ML IJ SOLN
INTRAMUSCULAR | Status: AC
  Filled 2024-05-24: qty 2

## 2024-05-24 MED ORDER — SODIUM CHLORIDE 0.9 % IV SOLN
INTRAVENOUS | Status: AC | PRN
  Administered 2024-05-24: 500 mL via INTRAMUSCULAR

## 2024-05-24 MED ORDER — ONDANSETRON HCL 4 MG/2ML IJ SOLN
INTRAMUSCULAR | Status: DC | PRN
Start: 2024-05-24 — End: 2024-05-24
  Administered 2024-05-24: 4 mg via INTRAVENOUS

## 2024-05-24 MED ORDER — SUGAMMADEX SODIUM 200 MG/2ML IV SOLN
INTRAVENOUS | Status: DC | PRN
  Administered 2024-05-24: 200 mg via INTRAVENOUS

## 2024-05-24 MED ORDER — FENTANYL CITRATE (PF) 100 MCG/2ML IJ SOLN
INTRAMUSCULAR | Status: AC
  Filled 2024-05-24: qty 2

## 2024-05-24 MED ORDER — DEXAMETHASONE SODIUM PHOSPHATE 10 MG/ML IJ SOLN
INTRAMUSCULAR | Status: DC | PRN
  Administered 2024-05-24: 4 mg via INTRAVENOUS

## 2024-05-24 MED ORDER — PROPOFOL 10 MG/ML IV BOLUS
INTRAVENOUS | Status: DC | PRN
  Administered 2024-05-24: 250 mg via INTRAVENOUS

## 2024-05-24 MED ORDER — CIPROFLOXACIN IN D5W 400 MG/200ML IV SOLN
INTRAVENOUS | Status: AC
  Filled 2024-05-24: qty 200

## 2024-05-24 MED ORDER — ROCURONIUM BROMIDE 10 MG/ML (PF) SYRINGE
PREFILLED_SYRINGE | INTRAVENOUS | Status: DC | PRN
Start: 2024-05-24 — End: 2024-05-24
  Administered 2024-05-24: 10 mg via INTRAVENOUS
  Administered 2024-05-24: 60 mg via INTRAVENOUS

## 2024-05-24 MED ORDER — LIDOCAINE 2% (20 MG/ML) 5 ML SYRINGE
INTRAMUSCULAR | Status: DC | PRN
  Administered 2024-05-24: 100 mg via INTRAVENOUS

## 2024-05-24 MED ORDER — MIDAZOLAM HCL 2 MG/2ML IJ SOLN
INTRAMUSCULAR | Status: DC | PRN
  Administered 2024-05-24: 2 mg via INTRAVENOUS

## 2024-05-24 MED ORDER — PROPOFOL 500 MG/50ML IV EMUL
INTRAVENOUS | Status: DC | PRN
  Administered 2024-05-24: 100 ug/kg/min via INTRAVENOUS

## 2024-05-24 NOTE — Anesthesia Postprocedure Evaluation (Signed)
 Anesthesia Post Note  Patient: Katrina Lambert  Procedure(s) Performed: ULTRASOUND, UPPER GI TRACT, ENDOSCOPIC EGD (ESOPHAGOGASTRODUODENOSCOPY) BIOPSY, SKIN, SUBCUTANEOUS TISSUE, OR MUCOUS MEMBRANE     Patient location during evaluation: PACU Anesthesia Type: General Level of consciousness: awake Pain management: pain level controlled Vital Signs Assessment: post-procedure vital signs reviewed and stable Respiratory status: spontaneous breathing, nonlabored ventilation and respiratory function stable Cardiovascular status: blood pressure returned to baseline and stable Postop Assessment: no apparent nausea or vomiting Anesthetic complications: no   No notable events documented.  Last Vitals:  Vitals:   05/24/24 1451 05/24/24 1529  BP:  (!) 159/104  Pulse: 91 89  Resp: 14 18  Temp:  36.6 C  SpO2: 95% 96%    Last Pain:  Vitals:   05/24/24 1529  TempSrc: Oral  PainSc:                  [name]

## 2024-05-24 NOTE — Progress Notes (Signed)
   Progress Note     Interval: NAEO. Still with abdominal pain  Objective: Vital signs in last 24 hours: Temp:  [98.1 F (36.7 C)-98.3 F (36.8 C)] 98.3 F (36.8 C) (07/09 0403) Pulse Rate:  [88-105] 94 (07/09 0403) Resp:  [12-18] 12 (07/09 0403) BP: (123-171)/(92-107) 123/92 (07/09 0403) SpO2:  [94 %-96 %] 94 % (07/09 0403) Last BM Date : 05/22/24  Intake/Output from previous day: 07/08 0701 - 07/09 0700 In: 360 [P.O.:360] Out: 0  Intake/Output this shift: No intake/output data recorded.  PE: Gen: NAD CV: RRR Pulm: NWOB Abd: soft, obese, some mild tenderness to palpation in epigastrium and LUQ. No peritonitis.   Lab Results:  Recent Labs    05/22/24 0406 05/22/24 1700 05/23/24 0535 05/23/24 1637 05/24/24 0434  WBC 9.3  --  11.8*  --   --   HGB 9.7*   < > 10.3* 10.6* 10.3*  HCT 31.5*   < > 32.7* 33.8* 32.6*  PLT 473*  --  482*  --   --    < > = values in this interval not displayed.   BMET Recent Labs    05/23/24 0535  NA 137  K 3.5  CL 105  CO2 23  GLUCOSE 117*  BUN 6  CREATININE 0.61  CALCIUM 8.7*   PT/INR No results for input(s): LABPROT, INR in the last 72 hours. CMP     Component Value Date/Time   NA 137 05/23/2024 0535   K 3.5 05/23/2024 0535   CL 105 05/23/2024 0535   CO2 23 05/23/2024 0535   GLUCOSE 117 (H) 05/23/2024 0535   BUN 6 05/23/2024 0535   CREATININE 0.61 05/23/2024 0535   CALCIUM 8.7 (L) 05/23/2024 0535   PROT 5.4 (L) 05/20/2024 0402   ALBUMIN 1.9 (L) 05/20/2024 0402   AST 9 (L) 05/20/2024 0402   ALT 5 05/20/2024 0402   ALKPHOS 56 05/20/2024 0402   BILITOT 0.2 05/20/2024 0402   GFRNONAA >60 05/23/2024 0535   GFRAA  12/20/2009 2308    >60        The eGFR has been calculated using the MDRD equation. This calculation has not been validated in all clinical situations. eGFR's persistently <60 mL/min signify possible Chronic Kidney Disease.   Lipase     Component Value Date/Time   LIPASE 110 (H) 05/23/2024  0535       Studies/Results: No results found.   Assessment/Plan 38 yo female with idiopathic pancreatitis with large pseudocyst  LOS: 7 days   I discussed with patient possibility of needing splenectomy given complexity of endoscopic procedure since there appears to be splenic involvement. If there is any injury to the spleen, may need emergent splenectomy versus observation versus angioembolization.  We discussed risks of surgery including but not limited to: bleeding, infection, injury to surrounding structures. We discussed need for post-splenectomy vaccines.  We are available if any endoscopic complications involving the spleen occur.  I reviewed last 24 h vitals and pain scores, last 48 h intake and output, last 24 h labs and trends, and last 24 h imaging results.  This care required moderate level of medical decision making.    Richerd Silversmith, MD [facility] Surgery 05/24/2024, 6:55 AM Please see Amion for pager number during day hours 7:00am-4:30pm

## 2024-05-24 NOTE — Hospital Course (Addendum)
 38 year old F with PMH of DM-2, HTN, asthma, IDA, morbid obesity, B12 deficiency, cervical myelopathy, tobacco use disorder and recent hospitalization at [facility] from 5/3-5/6 for idiopathic pancreatitis and at [facility] 5/31/611 for recurrent pancreatitis with pseudocyst as well as hospital-acquired pneumonia complicated by parapneumonic effusion requiring chest tube placement returning to ED with worsening intermittent abdominal pain, nausea and vomiting since her discharge from the hospital. Being managed for abdominal pain in the setting of pancreatic pseudocyst, s/p percutaneous cyst drainage.    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  S/p EGD with EUS assisted drainage 7/9.  Drained 800 mL cyst.  Recommendations from GI to continue antibiotics for 3 days.  Transitioned to Cipro  p.o.  Will repeat imaging in 1-2 days.  Advance diet as tolerated IV fluid, antiemetics on board.  Will attempt to wean back IV Dilaudid , add p.o. opioids.  Add scheduled Toradol .   Concern for sepsis - SIRS with lactic acidosis, tachycardia and leukocytosis.  Leukocytosis resolved.  Sepsis ruled out.   Diabetes mellitus - A1c 7.3.  Insulin  sliding scale on board.   HTN - As needed antihypertensives.   Tobacco use disorder - Encourage cessation.  Nicotine  patch as needed.   Hypokalemia/hypomagnesemia - Replenishment ongoing.  Recheck BMP and magnesium  in AM.

## 2024-05-24 NOTE — Interval H&P Note (Signed)
 History and Physical Interval Note:  05/24/2024 12:47 PM  Katrina Lambert  has presented today for surgery, with the diagnosis of Pancreatic pseudocyst.  The various methods of treatment have been discussed with the patient and family. After consideration of risks, benefits and other options for treatment, the patient has consented to  Procedure(s): ULTRASOUND, UPPER GI TRACT, ENDOSCOPIC (N/A) as a surgical intervention.  The patient's history has been reviewed, patient examined, no change in status, stable for surgery.  I have reviewed the patient's chart and labs.  Questions were answered to the patient's satisfaction.    The risks of an EUS including intestinal perforation, bleeding, infection, aspiration, and medication effects were discussed as was the possibility it may not give a definitive diagnosis if a biopsy is performed.  When a biopsy of the pancreas is done as part of the EUS, there is an additional risk of pancreatitis at the rate of about 1-2%.  It was explained that procedure related pancreatitis is typically mild, although it can be severe and even life threatening, which is why we do not perform random pancreatic biopsies and only biopsy a lesion/area we feel is concerning enough to warrant the risk.    Zadrian Mccauley Mansouraty Jr

## 2024-05-24 NOTE — Progress Notes (Signed)
 Progress Note   Patient: Katrina Lambert FMW:[id number] DOB: 1985/11/27 DOA: 05/17/2024  DOS: the patient was seen and examined on 05/24/2024   Brief hospital course:  38 year old F with PMH of DM-2, HTN, asthma, IDA, morbid obesity, B12 deficiency, cervical myelopathy, tobacco use disorder and recent hospitalization at [facility] from 5/3-5/6 for idiopathic pancreatitis and at [facility] 5/31/611 for recurrent pancreatitis with pseudocyst as well as hospital-acquired pneumonia complicated by parapneumonic effusion requiring chest tube placement returning to ED with worsening intermittent abdominal pain, nausea and vomiting since her discharge from the hospital. Being managed for abdominal pain in the setting of pancreatic pseudocyst, s/p percutaneous cyst drainage.   Assessment and Plan:  Acute on chronic pancreatitis with pancreatic pseudocyst - S/p CT-guided diagnostic aspiration.  Continues on empiric cefepime  plus Flagyl .  Antiemetics, opioids on board.  Due for EUS assisted drainage later today.  Concern for sepsis - SIRS with lactic acidosis, tachycardia and leukocytosis.  Leukocytosis resolved.  Sepsis ruled out.  Diabetes mellitus - A1c 7.3.  Insulin  sliding scale on board.  HTN - As needed antihypertensives.  Tobacco use disorder - Encourage cessation.  Nicotine  patch as needed.  Hypokalemia/hypomagnesemia - Replenishment ongoing.  Recheck BMP and magnesium  in AM.   Subjective: Patient resting comfortably this morning.  Slated for surgical procedure later this afternoon.  Denies any fever, chills, chest pain.  Still complaining of some nausea and abdominal pain.  Physical Exam:  Vitals:   05/24/24 0403 05/24/24 0831 05/24/24 1212 05/24/24 1218  BP: (!) 123/92 (!) 159/85 (!) 172/107 (!) 168/99  Pulse: 94 89 (!) 102 95  Resp: 12  13 14   Temp: 98.3 F (36.8 C) 98 F (36.7 C) 97.8 F (36.6 C)   TempSrc:  Oral Temporal   SpO2: 94% 94% 98%   Weight:   108.9 kg   Height:   5' 5 (1.651 m)      GENERAL:  Alert, pleasant, no acute distress  HEENT:  EOMI CARDIOVASCULAR:  RRR, no murmurs appreciated RESPIRATORY:  Clear to auscultation, no wheezing, rales, or rhonchi GASTROINTESTINAL:  Soft, nontender, nondistended EXTREMITIES:  No LE edema bilaterally NEURO:  No new focal deficits appreciated SKIN:  No rashes noted PSYCH:  Appropriate mood and affect     Data Reviewed:  No new imaging to review  Previous records (including but not limited to H&P, progress notes, nursing notes, TOC management) were reviewed in assessment of this patient.  Labs: CBC: Recent Labs  Lab 05/18/24 1644 05/19/24 0428 05/19/24 1641 05/20/24 0402 05/20/24 1721 05/21/24 0358 05/21/24 1610 05/22/24 0406 05/22/24 1700 05/23/24 0535 05/23/24 1637 05/24/24 0434  WBC 15.6* 12.5*  --  12.9*  --  10.0  --  9.3  --  11.8*  --   --   NEUTROABS 13.4* 9.8*  --   --   --  6.2  --   --   --  7.9*  --   --   HGB 12.6 11.9*   < > 10.7*   < > 9.7*   < > 9.7* 10.3* 10.3* 10.6* 10.3*  HCT 40.7 38.2   < > 33.5*   < > 31.2*   < > 31.5* 32.9* 32.7* 33.8* 32.6*  MCV 82.2 80.6  --  82.1  --  80.6  --  80.6  --  80.1  --   --   PLT 619* 504*  --  433*  --  438*  --  473*  --  482*  --   --    < > =  values in this interval not displayed.   Basic Metabolic Panel: Recent Labs  Lab 05/18/24 0329 05/19/24 0428 05/20/24 0402 05/21/24 0358 05/23/24 0535  NA 134* 133* 134* 136 137  K 3.5 3.9 3.8 3.5 3.5  CL 101 103 105 103 105  CO2 24 22 20* 24 23  GLUCOSE 106* 118* 101* 86 117*  BUN 7 12 14 10 6   CREATININE 0.74 0.79 0.69 0.86 0.61  CALCIUM 8.5* 8.3* 8.1* 8.4* 8.7*  MG 1.6* 1.8 1.7  --   --   PHOS 4.6  --   --   --   --    Liver Function Tests: Recent Labs  Lab 05/17/24 1440 05/19/24 0428 05/20/24 0402  AST 15 7* 9*  ALT 13 8 5   ALKPHOS 80 65 56  BILITOT 0.8 0.8 0.2  PROT 7.2 5.6* 5.4*  ALBUMIN 2.8* 2.1* 1.9*   CBG: Recent Labs  Lab 05/23/24 0729 05/23/24 1129 05/23/24 1650  05/24/24 1028 05/24/24 1127  GLUCAP 96 96 95 103* 94    Scheduled Meds:  [MAR Hold] feeding supplement  237 mL Oral BID BM   [MAR Hold] insulin  aspart  0-9 Units Subcutaneous TID WC   [MAR Hold] nicotine   14 mg Transdermal Daily   [MAR Hold] pantoprazole   40 mg Oral Q1200   [MAR Hold] sodium chloride  flush  3 mL Intravenous Q12H   Continuous Infusions:  sodium chloride      sodium chloride  500 mL (05/24/24 1221)   PRN Meds:.sodium chloride , [MAR Hold] acetaminophen , [MAR Hold] albuterol , [MAR Hold] diphenhydrAMINE , [MAR Hold]  HYDROmorphone  (DILAUDID ) injection, [MAR Hold] labetalol , [MAR Hold] melatonin, [MAR Hold] nicotine  polacrilex, [MAR Hold] ondansetron  (ZOFRAN ) IV, [MAR Hold] oxyCODONE  **OR** [MAR Hold] oxyCODONE , [MAR Hold] polyethylene glycol  Family Communication: None at bedside  Disposition: Status is: Inpatient Remains inpatient appropriate because: Pancreatitis     Time spent: 35 minutes  Length of inpatient stay: 7 days  Author: Carliss LELON Canales, DO 05/24/2024 12:26 PM  For on call review [email or web address].

## 2024-05-24 NOTE — Progress Notes (Signed)
 [facility] night cross cover note:   I was notified by the patient's RN that this patient, who has been NPO since MN on 7/9 leading up to EUS drainage of pancreatic cyst that occurred today, is inquiring if diet can be advanced. Per initial chart review, it does not appear that additional procedures are currently planned for tomorrow morning. I subsequently updated her diet order to clear liquid diet.     Eva Pore, DO Hospitalist

## 2024-05-24 NOTE — Progress Notes (Addendum)
 Initial Nutrition Assessment  DOCUMENTATION CODES:   Non-severe (moderate) malnutrition in context of acute illness/injury  INTERVENTION:  If unable to advanced diet s/p surgery, recommend nutrition support to meet needs given her recent significant weight loss, reported prolonged poor PO intake, and inflammation 2/2 her acute illness and pending surgical procedure.   Monitor diet advancement and tolerance Encourage PO intake as diet advanced: Recommend Heart healthy diet but liberalize from carb modified to promote PO intake  Once intake increases can recommend carb modified diet as well depending on CBGs Add Ensure Enlive po BID, each supplement provides 350 kcal and 20 grams of protein. Add Magic cup TID with meals, each supplement provides 290 kcal and 9 grams of protein MVI with minerals daily Monitor magnesium , potassium, and phosphorus daily for at least 3 days Add Thiamine  100 mg daily for 7 days  Recommend updated weight   NUTRITION DIAGNOSIS:  Moderate Malnutrition related to acute illness as evidenced by percent weight loss (7.7% in two months), mild muscle depletion.  GOAL:  Patient will meet greater than or equal to 90% of their needs  MONITOR:  PO intake, Supplement acceptance, Diet advancement, Labs  REASON FOR ASSESSMENT:  NPO/Clear Liquid Diet    ASSESSMENT:   Pt with PMH significant for: T2DM, HTN, asthma, IDA, morbid obesity, B12 deficiency, cervical myelopathy, tobacco use disorder. Presented to [facility] with worsening intermittent abdominal pain and persistent N/V since her recent discharge. Of note, she was hospitalized at [facility] from 5/3-5/6  for idiopathic pancreatitis and at [facility] from 5/31 - 6/11 for recurrent pancreatitis w/ pseudocyst c/b hospital acquired PNA requiring chest tube placement. 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  7/2 admitted 7/3 advanced to clear liquid diet 7/4 NPO most of the day; CLD reinstated 7/6  advanced to full liquid diet 7/7 advanced to GI soft diet 7/8 downgraded to clear liquid diet d/y diet intolerance 7/9 scheduled EUS guided cyst drainage  Patient sitting up in bed at time of visit. Scheduled for drainage of pancreatic pseudocyst today. Both surgery and GI involved as patient is at risk for complications during endoscopic procedure given proximity to spleen. May require splenectomy. She reports all of this started in May after she sustained what she thinks is a spider bite that subsequently required drainage.   Average Meal Intake 7/6: 50-100% x3 documented meals 7/7: 100% x2 documented meals 7/8: 0% x2 documented meals  She endorses poor intake since discharge on 6/11 given ongoing intermittent abdominal pain and N/V. Was not really eating set meals throughout the day and reports eating a few bites here and there off of her daughters meals. These items included chips, french fries, etc. She also endorses consuming yogurt, [place] sandwiches, Diet Mountain Dew, and Gatorade. No issues chewing or swallowing. Dentition is adequate, but with some missing and/or broken.   Discussed the need for increased calories and protein s/p surgery. She verbalizes understanding and is willing to continue to receive Ensure supplements to aid in augmenting intake.   Admit/Current Weight: 108.9kg  Endorses a 50lbs weight loss since May with UBW at that time reported as around 280lbs. Per chart review, She has lost 7.7% since last admission in May. This is considered clinically significant for the time frame reviewed. Recommend new weight collection to establish trend. She has no edema on exam. Bowels stable at baseline. She reports going 1-2 times daily and does, at times, require a stool softener.   Given her prolonged GI issues and  reported poor intake as well as being NPO/Clear Liquid/Full Liquid diet for most of her admission with poor tolerance of GI soft diet, would strongly recommend  nutrition support s/p surgery if unable to advance diet. Discussed this with attending via secure chat, who is amicable to this. Will monitor patient s/p procedure and ability to advance diet.   Did require some potassium repletion. Recommend refeeding protocol given level of intolerance and poor PO intake for prolonged period of time. MD has already order magnesium /potassium draw for tomorrow morning. Will also recommend phosphorus draw for three days total s/p resumption of diet.   Meds: pantoprazole , SSI novolog   Labs from 7/08 reviewed:  Na+ 137 (wdl) K+ 3.3--->3.5 (wdl) CRP 19.6 (H) WBC 9.3>11.8 (H) CBGs 86-117 x24 hours A1c 7.3 (04/2024)  NUTRITION - FOCUSED PHYSICAL EXAM:  Flowsheet Row Most Recent Value  Orbital Region No depletion  Upper Arm Region No depletion  Thoracic and Lumbar Region No depletion  Buccal Region No depletion  Temple Region Mild depletion  Clavicle Bone Region Mild depletion  Clavicle and Acromion Bone Region No depletion  Scapular Bone Region No depletion  Dorsal Hand No depletion  Patellar Region Mild depletion  Anterior Thigh Region Mild depletion  Posterior Calf Region Mild depletion  Edema (RD Assessment) None  Hair Reviewed  Eyes Reviewed  Mouth Reviewed  Skin Reviewed  Nails Reviewed    Diet Order:   Diet Order             Diet NPO time specified Except for: Sips with Meds  Diet effective midnight             EDUCATION NEEDS:   Education needs have been addressed  Skin:  Skin Assessment: Reviewed RN Assessment  Last BM:  7/7  Height:  Ht Readings from Last 1 Encounters:  05/24/24 5' 5 (1.651 m)   Weight:  Wt Readings from Last 1 Encounters:  05/24/24 108.9 kg   Ideal Body Weight:  56.8 kg  BMI:  Body mass index is 39.94 kg/m.  Estimated Nutritional Needs:   Kcal:  1800-2000 kcals  Protein:  90-105g  Fluid:  1.8-2.0L/day  Blair Deaner MS, RD, LDN Registered Dietitian Clinical Nutrition RD Inpatient  Contact Info in Amion

## 2024-05-24 NOTE — Anesthesia Preprocedure Evaluation (Addendum)
 Anesthesia Evaluation  Patient identified by MRN, date of birth, ID band Patient awake    Reviewed: Allergy & Precautions, NPO status , Patient's Chart, lab work & pertinent test results  History of Anesthesia Complications (+) PONV and history of anesthetic complications  Airway Mallampati: III  TM Distance: >3 FB Neck ROM: Full    Dental  (+) Dental Advisory Given, Missing Multiple broken teeth. Denies anything being loose.:   Pulmonary neg shortness of breath, asthma , neg sleep apnea, neg COPD, neg recent URI, Current Smoker and Patient abstained from smoking.   Pulmonary exam normal breath sounds clear to auscultation       Cardiovascular hypertension (lisinopril ), Pt. on medications (-) angina (-) Past MI, (-) Cardiac Stents and (-) CABG (-) dysrhythmias  Rhythm:Regular     Neuro/Psych  Headaches, neg Seizures PSYCHIATRIC DISORDERS Anxiety Depression Bipolar Disorder    Neuromuscular disease (s/p ACDF)    GI/Hepatic Neg liver ROS,GERD  ,,Pancreatic pseudocyst   Endo/Other  Pre-diabetes  Renal/GU negative Renal ROS     Musculoskeletal  (+) Arthritis ,    Abdominal  (+) + obese  Peds  Hematology negative hematology ROS (+) Lab Results      Component                Value               Date                      WBC                      11.8 (H)            05/23/2024                HGB                      10.3 (L)            05/24/2024                HCT                      32.6 (L)            05/24/2024                MCV                      80.1                05/23/2024                PLT                      482 (H)             05/23/2024              Anesthesia Other Findings 38 year old F with PMH of DM-2, HTN, asthma, IDA, morbid obesity, B12 deficiency, cervical myelopathy, tobacco use disorder and recent hospitalization at [facility] from 5/3-5/6 for idiopathic pancreatitis and at [facility] 5/31/611 for recurrent  pancreatitis with pseudocyst as well as hospital-acquired pneumonia complicated by parapneumonic effusion requiring chest tube placement returning to ED with worsening intermittent abdominal pain, nausea and vomiting since her discharge from the hospital. Being managed for abdominal pain in the setting of pancreatic pseudocyst,  s/p percutaneous cyst drainage.  Reproductive/Obstetrics                              Anesthesia Physical Anesthesia Plan  ASA: 3  Anesthesia Plan: General   Post-op Pain Management:    Induction: Intravenous  PONV Risk Score and Plan: 3 and Ondansetron , Dexamethasone , Treatment may vary due to age or medical condition and Midazolam   Airway Management Planned: Oral ETT  Additional Equipment:   Intra-op Plan:   Post-operative Plan: Extubation in OR  Informed Consent: I have reviewed the patients History and Physical, chart, labs and discussed the procedure including the risks, benefits and alternatives for the proposed anesthesia with the patient or authorized representative who has indicated his/her understanding and acceptance.     Dental advisory given  Plan Discussed with: CRNA and Anesthesiologist  Anesthesia Plan Comments: (Risks of general anesthesia discussed including, but not limited to, sore throat, hoarse voice, chipped/damaged teeth, injury to vocal cords, nausea and vomiting, allergic reactions, lung infection, heart attack, stroke, and death. All questions answered. )         Anesthesia Quick Evaluation

## 2024-05-24 NOTE — Transfer of Care (Signed)
 Immediate Anesthesia Transfer of Care Note  Patient: Katrina Lambert  Procedure(s) Performed: ULTRASOUND, UPPER GI TRACT, ENDOSCOPIC EGD (ESOPHAGOGASTRODUODENOSCOPY) BIOPSY, SKIN, SUBCUTANEOUS TISSUE, OR MUCOUS MEMBRANE  Patient Location: PACU  Anesthesia Type:General  Level of Consciousness: awake, alert , and oriented  Airway & Oxygen Therapy: Patient Spontanous Breathing and Patient connected to face mask oxygen  Post-op Assessment: Report given to RN and Post -op Vital signs reviewed and stable  Post vital signs: Reviewed and stable  Last Vitals:  Vitals Value Taken Time  BP 155/92 05/24/24 14:30  Temp    Pulse 95 05/24/24 14:30  Resp 14 05/24/24 14:30  SpO2 99 % 05/24/24 14:30  Vitals shown include unfiled device data.  Last Pain:  Vitals:   05/24/24 1212  TempSrc: Temporal  PainSc: 7       Patients Stated Pain Goal: 0 (05/23/24 1759)  Complications: No notable events documented.

## 2024-05-24 NOTE — Plan of Care (Signed)
  Problem: Health Behavior/Discharge Planning: Goal: Ability to manage health-related needs will improve Outcome: Progressing   Problem: Clinical Measurements: Goal: Ability to maintain clinical measurements within normal limits will improve Outcome: Progressing Goal: Will remain free from infection Outcome: Progressing Goal: Diagnostic test results will improve Outcome: Progressing Goal: Respiratory complications will improve Outcome: Progressing Goal: Cardiovascular complication will be avoided Outcome: Progressing   Problem: Nutrition: Goal: Adequate nutrition will be maintained Outcome: Progressing   Problem: Coping: Goal: Level of anxiety will decrease Outcome: Progressing   Problem: Elimination: Goal: Will not experience complications related to bowel motility Outcome: Progressing Goal: Will not experience complications related to urinary retention Outcome: Progressing   Problem: Pain Managment: Goal: General experience of comfort will improve and/or be controlled Outcome: Progressing   Problem: Safety: Goal: Ability to remain free from injury will improve Outcome: Progressing   Problem: Skin Integrity: Goal: Risk for impaired skin integrity will decrease Outcome: Progressing   Problem: Education: Goal: Ability to describe self-care measures that may prevent or decrease complications (Diabetes Survival Skills Education) will improve Outcome: Progressing Goal: Individualized Educational Video(s) Outcome: Progressing   Problem: Coping: Goal: Ability to adjust to condition or change in health will improve Outcome: Progressing   Problem: Fluid Volume: Goal: Ability to maintain a balanced intake and output will improve Outcome: Progressing   Problem: Health Behavior/Discharge Planning: Goal: Ability to identify and utilize available resources and services will improve Outcome: Progressing Goal: Ability to manage health-related needs will improve Outcome:  Progressing   Problem: Metabolic: Goal: Ability to maintain appropriate glucose levels will improve Outcome: Progressing   Problem: Nutritional: Goal: Maintenance of adequate nutrition will improve Outcome: Progressing Goal: Progress toward achieving an optimal weight will improve Outcome: Progressing   Problem: Skin Integrity: Goal: Risk for impaired skin integrity will decrease Outcome: Progressing   Problem: Tissue Perfusion: Goal: Adequacy of tissue perfusion will improve Outcome: Progressing

## 2024-05-24 NOTE — Op Note (Signed)
 [facility] Patient Name: Katrina Lambert Procedure Date : 05/24/2024 MRN: [id number] Attending MD: Aloha Finner , MD, [id number] Date of Birth: [birth year] CSN: [id number] Age: 38 Admit Type: Inpatient Procedure:                Upper EUS Indications:              Pancreatic cyst on CT scan Providers:                Aloha Finner, MD, Particia Fischer, RN, [facility]                            [place], Technician, Cena Currier, CRNA Referring MD:              Medicines:                General Anesthesia, Cipro  400 mg IV Complications:            No immediate complications. Estimated Blood Loss:     Estimated blood loss was minimal. Procedure:                Pre-Anesthesia Assessment:                           - Prior to the procedure, a History and Physical                            was performed, and patient medications and                            allergies were reviewed. The patient's tolerance of                            previous anesthesia was also reviewed. The risks                            and benefits of the procedure and the sedation                            options and risks were discussed with the patient.                            All questions were answered, and informed consent                            was obtained. Prior Anticoagulants: The patient has                            taken no anticoagulant or antiplatelet agents. ASA                            Grade Assessment: III - A patient with severe                            systemic disease. After reviewing the risks and  benefits, the patient was deemed in satisfactory                            condition to undergo the procedure.                           After obtaining informed consent, the endoscope was                            passed under direct vision. Throughout the                            procedure, the patient's blood pressure, pulse, and                             oxygen saturations were monitored continuously. The                            [id number]-H190 ([id number] Olympus endoscope was introduced                            through the mouth, and advanced to the second part                            of duodenum. The [id number] ([id number]) Olympus linear                            ultrasound scope was introduced through the mouth,                            and advanced to the duodenum for ultrasound                            examination from the stomach and duodenum. The                            upper EUS was accomplished without difficulty. The                            patient tolerated the procedure. Scope In: Scope Out: Findings:      ENDOSCOPIC FINDING: :      No gross lesions were noted in the entire esophagus.      The Z-line was irregular. Biopsies were taken with a cold forceps for       histology.      Multiple areas of extrinsic compression on the stomach was found in the       cardia and in the gastric body.      Patchy mildly erythematous mucosa without bleeding was found in the       entire examined stomach. Biopsies were taken with a cold forceps for       histology and Helicobacter pylori testing.      No gross lesions were noted in the duodenal bulb, in the first portion       of the duodenum and in the second portion of the  duodenum.      The major papilla was normal.      ENDOSONOGRAPHIC FINDING: :      Pancreatic parenchymal abnormalities were noted in the entire pancreas.       These consisted of diffusely increased echogenicity and lobularity       without honeycombing.      Anechoic, multicystic and septated lesions suggestive of multiple cysts       were identified in the pancreatic body, pancreatic tail and       peripancreatic region including peri-splenic region (inferior and       superior to portions visulized). The largest lesion measured 120 mm by       81 mm in maximal cross-sectional diameter.  There was no associated mass.       Within the cystic portions of the splenic/perisplenic region there       appeared to be darker fluid that could be suggestive of possible       necrotic fluid or whether hemorrhage could have been possible. The       largest other cyst (more distal within the stomach) was       sampled/aspirated however to decrease risk of bleeding. Diagnostic and       therapeutic needle aspiration for fluid was performed of that cystic       component. Color Doppler imaging was utilized prior to needle puncture       to confirm a lack of significant vascular structures within the needle       path. One pass was made with the 19 gauge needle using a transgastric       approach. A stylet was used. The amount of fluid collected was 800 mL.       The fluid was cloudy, brown and watery. Sample(s) were sent for       bacterial cultures.      There was no dilation in the common bile duct and there was       prominence/dilation within the common hepatic duct. No evidence of       choledocholithiasis.      Endosonographic imaging of the ampulla showed no intramural       (subepithelial) lesion.      There was no sign of significant endosonographic abnormality in the       visualized portion of the spleen. No masses were identified.      Endosonographic imaging in the visualized portion of the liver showed no       mass.      No malignant-appearing lymph nodes were visualized in the celiac region       (level 20), peripancreatic region and porta hepatis region.      The celiac region was visualized. Impression:               EGD Impression:                           - No gross lesions in the entire esophagus. Z-line                            irregular. Biopsied.                           - Multiple areas of extrinsic compression in the  cardia and in the gastric body.                           - Erythematous mucosa in the stomach. Biopsied.                            - No gross lesions in the duodenal bulb, in the                            first portion of the duodenum and in the second                            portion of the duodenum.                           - Normal major papilla.                           EUS Impression:                           - Pancreatic parenchymal abnormalities consisting                            of diffusely increased echogenicity and lobularity                            were noted in the entire pancreas.                           - Multiple cystic lesions were seen in the                            pancreatic body, pancreatic tail and peripancreatic                            region and perisplenic regions. Tissue has not been                            obtained. However, the endosonographic appearance                            is highly suspicious for a pancreatic pseudocysts                            with some cysts within the splenic region to                            possibly have had hemorrhage. Largest cyst most                            distal in the stomach was aspirated. Fine needle                            aspiration for fluid  performed (800 cc of fluid                            removed).                           - There was a normal appearing CBD and dilated CHD.                            No choledocholithiasis noted.                           - Endosonographic images of the spleen were                            unremarkable otherwise.                           - No malignant-appearing lymph nodes were                            visualized in the celiac region (level 20),                            peripancreatic region and porta hepatis region. Recommendation:           - The patient will be observed post-procedure,                            until all discharge criteria are met.                           - Return patient to hospital ward for ongoing care.                            - ADAT.                           - Monitor for signs/symptoms of bleeding,                            perforation, and infection. If issues please call                            our number to get further assistance as needed.                           - Ciprofloxacin  500 mg twice daily for 3-days                            (order placed).                           - Observe patient's clinical course.                           - Await path results.                           -  Based on patient's overall trajectory, consider                            repeat imaging in next 48-72 hours. If worsening,                            then sooner, otherwise, will be helpful to have                            this imaging done (even if much better) so that we                            have some sense of what the fluid collections still                            look like and if they enlarge in future, the                            concern of potentially needing a repeat aspiration                            or if she has a pancreatic ductal disruption that                            could require Pancreatic ERCP.                           - The findings and recommendations were discussed                            with the patient.                           - The findings and recommendations were discussed                            with the referring physician. Procedure Code(s):        --- Professional ---                           [phone], Esophagogastroduodenoscopy, flexible,                            transoral; with transendoscopic ultrasound-guided                            intramural or transmural fine needle                            aspiration/biopsy(s), (includes endoscopic                            ultrasound examination limited to the esophagus,  stomach or duodenum, and adjacent structures)                           43239, 59,  Esophagogastroduodenoscopy, flexible,                            transoral; with biopsy, single or multiple Diagnosis Code(s):        --- Professional ---                           K22.89, Other specified disease of esophagus                           K31.89, Other diseases of stomach and duodenum                           K86.9, Disease of pancreas, unspecified                           K86.2, Cyst of pancreas                           I89.9, Noninfective disorder of lymphatic vessels                            and lymph nodes, unspecified                           K83.8, Other specified diseases of biliary tract CPT copyright 2022 [employer]. All rights reserved. The codes documented in this report are preliminary and upon coder review may  be revised to meet current compliance requirements. Aloha Finner, MD 05/24/2024 5:18:26 PM Number of Addenda: 0

## 2024-05-24 NOTE — Anesthesia Procedure Notes (Signed)
 Procedure Name: Intubation Date/Time: 05/24/2024 1:20 PM  Performed by: Lanning Cena RAMAN, CRNAPre-anesthesia Checklist: Patient identified, Emergency Drugs available, Suction available, Patient being monitored and Timeout performed Patient Re-evaluated:Patient Re-evaluated prior to induction Oxygen Delivery Method: Circle system utilized Preoxygenation: Pre-oxygenation with 100% oxygen Induction Type: IV induction Ventilation: Mask ventilation without difficulty Laryngoscope Size: Glidescope and 3 Grade View: Grade I Tube type: Oral Tube size: 7.0 mm Number of attempts: 1 Airway Equipment and Method: Stylet Placement Confirmation: ETT inserted through vocal cords under direct vision, positive ETCO2, CO2 detector and breath sounds checked- equal and bilateral Secured at: 20 cm Tube secured with: Tape Dental Injury: Teeth and Oropharynx as per pre-operative assessment

## 2024-05-24 NOTE — Brief Op Note (Signed)
 Brief Operative note  EGD Esophagus unremarkable HH 1 cm Gastritis throughout - biopsied. Multiple Extrinsic impression areas. Duodenum unremarkable. Major Papilla unremarkable.  EUS No CDL. Multiple pancreatic and peripancreatic and perisplenic collections present. Drained 800 cc from cyst most distal to the perisplenic component.  Cipro  400 mg IV x 1 dose during procedure.  Further recommendations as per [facility]ATION note.   Aloha Finner, MD [facility] Office # [phone]

## 2024-05-25 DIAGNOSIS — R630 Anorexia: Secondary | ICD-10-CM | POA: Diagnosis not present

## 2024-05-25 DIAGNOSIS — K859 Acute pancreatitis without necrosis or infection, unspecified: Secondary | ICD-10-CM | POA: Diagnosis not present

## 2024-05-25 DIAGNOSIS — K863 Pseudocyst of pancreas: Secondary | ICD-10-CM

## 2024-05-25 DIAGNOSIS — R1012 Left upper quadrant pain: Secondary | ICD-10-CM | POA: Diagnosis not present

## 2024-05-25 DIAGNOSIS — K8689 Other specified diseases of pancreas: Secondary | ICD-10-CM | POA: Diagnosis not present

## 2024-05-25 DIAGNOSIS — K2970 Gastritis, unspecified, without bleeding: Secondary | ICD-10-CM

## 2024-05-25 DIAGNOSIS — K8500 Idiopathic acute pancreatitis without necrosis or infection: Secondary | ICD-10-CM | POA: Diagnosis not present

## 2024-05-25 DIAGNOSIS — R110 Nausea: Secondary | ICD-10-CM | POA: Diagnosis not present

## 2024-05-25 LAB — BASIC METABOLIC PANEL WITH GFR
Anion gap: 11 (ref 5–15)
BUN: 6 mg/dL (ref 6–20)
CO2: 24 mmol/L (ref 22–32)
Calcium: 9.1 mg/dL (ref 8.9–10.3)
Chloride: 101 mmol/L (ref 98–111)
Creatinine, Ser: 0.73 mg/dL (ref 0.44–1.00)
GFR, Estimated: >60 mL/min (ref >60)
Glucose, Bld: 117 mg/dL — ABNORMAL HIGH (ref 70–99)
Potassium: 4 mmol/L (ref 3.5–5.1)
Sodium: 136 mmol/L (ref 135–145)

## 2024-05-25 LAB — GLUCOSE, CAPILLARY
Glucose-Capillary: 124 mg/dL — ABNORMAL HIGH (ref 70–99)
Glucose-Capillary: 98 mg/dL (ref 70–99)
Glucose-Capillary: 133 mg/dL — ABNORMAL HIGH (ref 70–99)
Glucose-Capillary: 126 mg/dL — ABNORMAL HIGH (ref 70–99)

## 2024-05-25 LAB — CBC
HCT: 32.7 % — ABNORMAL LOW (ref 36.0–46.0)
Hemoglobin: 10.3 g/dL — ABNORMAL LOW (ref 12.0–15.0)
MCH: 25.2 pg — ABNORMAL LOW (ref 26.0–34.0)
MCHC: 31.5 g/dL (ref 30.0–36.0)
MCV: 80.1 fL (ref 80.0–100.0)
Platelets: 530 K/uL — ABNORMAL HIGH (ref 150–400)
RBC: 4.08 MIL/uL (ref 3.87–5.11)
RDW: 14.4 % (ref 11.5–15.5)
WBC: 11.6 K/uL — ABNORMAL HIGH (ref 4.0–10.5)
nRBC: 0 % (ref 0.0–0.2)

## 2024-05-25 LAB — MAGNESIUM: Magnesium: 1.7 mg/dL (ref 1.7–2.4)

## 2024-05-25 NOTE — Progress Notes (Addendum)
 Daily Progress Note  DOA: 05/17/2024 Hospital Day: 9   Cc: Upper abdominal pain and pseudocysts   ASSESSMENT    38 yo female with Idiopathic pancreatitis occurred at outside hospital in May of this year   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 S/p EUS with cyst drainage.05/24/24.  Multiple cystic lesions were seen in the pancreatic body, pancreatic tail and peripancreatic region and perisplenic regions. The endosonographic appearance was highly suspicious for pancreatic pseudocysts with some cysts within the splenic region to possibly have had hemorrhage, 800 ml fluid aspirated from large cystic lesion  >>TODAY: She feels much better.  Able to take a deep breath comfortably.  Tolerating clear liquids and would like to advance to full liquid diet  Gastritis, s/p biopsy   Principal Problem:   Pancreatic abscess Active Problems:   Acute pancreatitis   Pleural effusion on left   Left upper quadrant pain   Nausea in adult   Encounter for smoking cessation counseling   Acute pancreatic fluid collection   Malnutrition of moderate degree   Pancreatic pseudocyst   Gastritis without bleeding   PLAN   -Awaiting gastric biopsy - Trial of full liquid diet   Subjective    She feels much better.  Able to take a deep breath comfortably.  Tolerating clear liquids and would like to advance to full liquid diet   Objective   GI Studies:   EGD / EUS EGD:  - No gross lesions in the entire esophagus. Z-line irregular. Biopsied. - Multiple areas of extrinsic compression in the cardia and in the gastric body. - Erythematous mucosa in the stomach. Biopsied. - No gross lesions in the duodenal bulb, in the first portion of the duodenum and in the second portion of the duodenum. - Normal major papilla.   EUS Impression:  Pancreatic parenchymal abnormalities consisting of diffusely  increased echogenicity and lobularity were noted in the entire pancreas. - Multiple cystic lesions were seen in the pancreatic body, pancreatic tail and peripancreatic region and perisplenic regions. Tissue has not been obtained. However, the endosonographic appearance is highly suspicious for a pancreatic pseudocysts with some cysts within the splenic region to possibly have had hemorrhage. Largest cyst most distal in the stomach was aspirated. Fine needle aspiration for fluid performed (800 cc of fluid removed). - There was a normal appearing CBD and dilated CHD. No choledocholithiasis noted. - Endosonographic images of the spleen were unremarkable otherwise. - No malignant-appearing lymph nodes were visualized in the celiac region (level 20), peripancreatic region and porta hepatis region.    let me call him and then add on any   Recent Labs    05/23/24 0535 05/23/24 1637 05/24/24 0434 05/25/24 0547  WBC 11.8*  --   --  11.6*  HGB 10.3* 10.6* 10.3* 10.3*  HCT 32.7* 33.8* 32.6* 32.7*  MCV 80.1  --   --  80.1  PLT 482*  --   --  530*   No results for input(s): FOLATE, VITAMINB12, FERRITIN, TIBC, IRONPCTSAT in the last 72 hours. Recent Labs    05/23/24 0535 05/25/24 0547  NA 137 136  K 3.5 4.0  CL 105 101  CO2 23 24  GLUCOSE 117* 117*  BUN 6 6  CREATININE 0.61 0.73  CALCIUM 8.7* 9.1   No results for input(s): PROT, ALBUMIN, AST, ALT, ALKPHOS, BILITOT, BILIDIR, IBILI in the last 72 hours.    Imaging:  CT  ABDOMEN PELVIS W CONTRAST CLINICAL DATA:  38 year old female with history of pancreatic pseudocyst status post image guided aspiration on 05/18/2024.  EXAM: CT ABDOMEN AND PELVIS WITH CONTRAST  TECHNIQUE: Multidetector CT imaging of the abdomen and pelvis was performed using the standard protocol following bolus administration of intravenous contrast.  RADIATION DOSE REDUCTION: This exam was performed according to the departmental  dose-optimization program which includes automated exposure control, adjustment of the mA and/or kV according to patient size and/or use of iterative reconstruction technique.  CONTRAST:  75mL OMNIPAQUE  IOHEXOL  350 MG/ML SOLN  COMPARISON:  05/17/2024  FINDINGS: Lower chest: Similar appearing small left pleural effusion with associated left basilar passive atelectasis.  Hepatobiliary: No focal liver abnormality is seen. Status post cholecystectomy. No biliary dilatation.  Pancreas: Similar normal appearing head and body of the pancreas with large multiloculated cystic lesion extending from the pancreatic tail superiorly to the greater curvature of the stomach and perisplenic regions. The collection is overall similar to most recent comparison CT.  Spleen: Splenic parenchyma is normal in size enhancement. Again seen is a large subcapsular appearing splenic cystic lesion with peripheral enhancement arising from the superior aspect which measures up to approximately 13.6 x 8.0 cm in maximum axial dimensions.  Adrenals/Urinary Tract: Adrenal glands are unremarkable. Kidneys are normal, without renal calculi, focal lesion, or hydronephrosis. Bladder is unremarkable.  Stomach/Bowel: Stomach is within normal limits, nearly collapsed extrinsic compression from neighboring pancreatic cysts. Appendix appears normal. No evidence of bowel wall thickening, distention, or inflammatory changes.  Vascular/Lymphatic: No significant vascular findings are present. No enlarged abdominal or pelvic lymph nodes.  Reproductive: Intrauterine device in place in unchanged position. Uterus and bilateral adnexa are otherwise unremarkable.  Other: Interval development of crescentic small volume ascites in the left upper quadrant.  Musculoskeletal: No acute osseous abnormality. Similar appearing advanced discogenic degenerative change at L3-L4 with unchanged anterolisthesis at this  level.  IMPRESSION: 1. Similar appearing large multiloculated cystic lesion extending from the pancreatic tail superiorly to the greater curvature of the stomach and perisplenic regions. The collection is overall similar to most recent comparison CT. 2. Interval development of crescentic small volume ascites in the left upper quadrant. Suspect pancreatic pseudocyst contents after recent instrumentation. 3. Similar appearing small left pleural effusion with associated left basilar passive atelectasis.  Ester Sides, MD  [facility]  [facility]  Electronically Signed   By: Ester Sides M.D.   On: 05/21/2024 21:23     Scheduled inpatient medications:   ciprofloxacin   500 mg Oral BID   feeding supplement  237 mL Oral BID BM   insulin  aspart  0-9 Units Subcutaneous TID WC   multivitamin with minerals  1 tablet Oral Daily   nicotine   14 mg Transdermal Daily   pantoprazole   40 mg Oral Q1200   sodium chloride  flush  3 mL Intravenous Q12H   thiamine   100 mg Oral Daily   Continuous inpatient infusions:  PRN inpatient medications: acetaminophen , albuterol , diphenhydrAMINE , HYDROmorphone  (DILAUDID ) injection, labetalol , melatonin, nicotine  polacrilex, ondansetron  (ZOFRAN ) IV, oxyCODONE  **OR** oxyCODONE , polyethylene glycol  Vital signs in last 24 hours: Temp:  [97.3 F (36.3 C)-98.1 F (36.7 C)] 97.8 F (36.6 C) (07/10 0758) Pulse Rate:  [76-102] 78 (07/10 0758) Resp:  [16-18] 18 (07/10 0758) BP: (134-161)/(85-103) 156/101 (07/10 0758) SpO2:  [94 %-98 %] 98 % (07/10 0758) Weight:  [891 kg] 108 kg (07/10 0800) Last BM Date : 05/22/24  Intake/Output Summary (Last 24 hours) at 05/25/2024 1639 Last  data filed at 05/25/2024 9081 Gross per 24 hour  Intake 3 ml  Output --  Net 3 ml    Intake/Output from previous day: 07/09 0701 - 07/10 0700 In: 600 [I.V.:400; IV Piggyback:200] Out: 0  Intake/Output this shift: Total I/O In: 3  [I.V.:3] Out: -    Physical Exam:  General: Alert female in NAD Heart:  Regular rate and rhythm.  Pulmonary: Normal respiratory effort Abdomen: Soft, nondistended, nontender. Normal bowel sounds. Extremities: No lower extremity edema  Neurologic: Alert and oriented Psych: Pleasant. Cooperative     LOS: 8 days   Vina Dasen ,NP 05/25/2024, 4:39 PM  I have taken an interval history, thoroughly reviewed the chart and examined the patient. I agree with the Advanced Practitioner's note, impression and recommendations, and have recorded additional findings, impressions and recommendations below. I performed a substantive portion of this encounter (>50% time spent), including a complete performance of the medical decision making.  My additional thoughts are as follows:  She looks and feels better today with less pain and tenderness.  She would like to advance to full liquid diet, we will do that slowly and see how things go. Plan regarding any further procedures in the near term versus follow-up imaging TBD depending on clinical progress.   Victory LITTIE Brand III Office:[phone]

## 2024-05-25 NOTE — Progress Notes (Signed)
 Progress Note   Patient: Katrina Lambert FMW:[id number] DOB: [birth year] DOA: 05/17/2024  DOS: the patient was seen and examined on 05/25/2024   Brief hospital course:  38 year old F with PMH of DM-2, HTN, asthma, IDA, morbid obesity, B12 deficiency, cervical myelopathy, tobacco use disorder and recent hospitalization at [facility] from 5/3-5/6 for idiopathic pancreatitis and at [facility] 5/31/611 for recurrent pancreatitis with pseudocyst as well as hospital-acquired pneumonia complicated by parapneumonic effusion requiring chest tube placement returning to ED with worsening intermittent abdominal pain, nausea and vomiting since her discharge from the hospital. Being managed for abdominal pain in the setting of pancreatic pseudocyst, s/p percutaneous cyst drainage.    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  S/p EGD with EUS assisted drainage yesterday 7/9.  Drained 800 mL cyst.  Recommendations from GI to continue antibiotics for the next 3 days.  Transition to Cipro  p.o.  Will repeat imaging in 48-72 hours.  Continue clear liquid diet, advance as tolerated.  IV fluid, antiemetics on board.  Pain control ongoing.   Concern for sepsis - SIRS with lactic acidosis, tachycardia and leukocytosis.  Leukocytosis resolved.  Sepsis ruled out.   Diabetes mellitus - A1c 7.3.  Insulin  sliding scale on board.   HTN - As needed antihypertensives.   Tobacco use disorder - Encourage cessation.  Nicotine  patch as needed.   Hypokalemia/hypomagnesemia - Replenishment ongoing.  Recheck BMP and magnesium  in AM.   Subjective: Patient sitting up in bed, states she still has some left-sided abdominal pain which radiates to her back but overall feeling improved.  Able to tolerate some clears, but still has some nausea.  Tolerated her procedure yesterday well.  Denies any fever, shortness of breath, chest pain, vomiting.  Physical Exam:  Vitals:    05/24/24 2047 05/25/24 0500 05/25/24 0758 05/25/24 0800  BP: (!) 161/103 134/85 (!) 156/101   Pulse: (!) 102 76 78   Resp: 18 18 18    Temp: (!) 97.3 F (36.3 C) 97.9 F (36.6 C) 97.8 F (36.6 C)   TempSrc:  Oral Oral   SpO2: 96% 96% 98%   Weight:    108 kg  Height:        GENERAL:  Alert, pleasant, no acute distress, obese HEENT:  EOMI CARDIOVASCULAR:  RRR, no murmurs appreciated RESPIRATORY:  Clear to auscultation, no wheezing, rales, or rhonchi GASTROINTESTINAL:  Soft, minimally tender, nondistended EXTREMITIES:  No LE edema bilaterally NEURO:  No new focal deficits appreciated SKIN:  No rashes noted PSYCH:  Appropriate mood and affect    Data Reviewed:  Imaging Studies: CT ABDOMEN PELVIS W CONTRAST Result Date: 05/21/2024 CLINICAL DATA:  38 year old female with history of pancreatic pseudocyst status post image guided aspiration on 05/18/2024. EXAM: CT ABDOMEN AND PELVIS WITH CONTRAST TECHNIQUE: Multidetector CT imaging of the abdomen and pelvis was performed using the standard protocol following bolus administration of intravenous contrast. RADIATION DOSE REDUCTION: This exam was performed according to the departmental dose-optimization program which includes automated exposure control, adjustment of the mA and/or kV according to patient size and/or use of iterative reconstruction technique. CONTRAST:  75mL OMNIPAQUE  IOHEXOL  350 MG/ML SOLN COMPARISON:  05/17/2024 FINDINGS: Lower chest: Similar appearing small left pleural effusion with associated left basilar passive atelectasis. Hepatobiliary: No focal liver abnormality is seen. Status post cholecystectomy. No biliary dilatation. Pancreas: Similar normal appearing head and body of the pancreas with large multiloculated cystic lesion extending from the pancreatic tail superiorly to  the greater curvature of the stomach and perisplenic regions. The collection is overall similar to most recent comparison CT. Spleen: Splenic parenchyma  is normal in size enhancement. Again seen is a large subcapsular appearing splenic cystic lesion with peripheral enhancement arising from the superior aspect which measures up to approximately 13.6 x 8.0 cm in maximum axial dimensions. Adrenals/Urinary Tract: Adrenal glands are unremarkable. Kidneys are normal, without renal calculi, focal lesion, or hydronephrosis. Bladder is unremarkable. Stomach/Bowel: Stomach is within normal limits, nearly collapsed extrinsic compression from neighboring pancreatic cysts. Appendix appears normal. No evidence of bowel wall thickening, distention, or inflammatory changes. Vascular/Lymphatic: No significant vascular findings are present. No enlarged abdominal or pelvic lymph nodes. Reproductive: Intrauterine device in place in unchanged position. Uterus and bilateral adnexa are otherwise unremarkable. Other: Interval development of crescentic small volume ascites in the left upper quadrant. Musculoskeletal: No acute osseous abnormality. Similar appearing advanced discogenic degenerative change at L3-L4 with unchanged anterolisthesis at this level. IMPRESSION: 1. Similar appearing large multiloculated cystic lesion extending from the pancreatic tail superiorly to the greater curvature of the stomach and perisplenic regions. The collection is overall similar to most recent comparison CT. 2. Interval development of crescentic small volume ascites in the left upper quadrant. Suspect pancreatic pseudocyst contents after recent instrumentation. 3. Similar appearing small left pleural effusion with associated left basilar passive atelectasis. Ester Sides, MD [facility] Electronically Signed   By: Ester Sides M.D.   On: 05/21/2024 21:23   DG Abd 2 Views Result Date: 05/18/2024 CLINICAL DATA:  [id number] Acute generalized abdominal pain [id number] EXAM: ABDOMEN - 2 VIEW COMPARISON:  May 17, 2024, April 19, 2024 FINDINGS: The study is  degraded by patient's body habitus. Nonobstructive bowel gas pattern. No pneumoperitoneum. No organomegaly or radiopaque calculi. No acute fracture or destructive lesion. Elevation of the left hemidiaphragm again partially visualized. The right lung bases otherwise clear.Cholecystectomy clips and intrauterine device overlying the pelvis. Multilevel degenerative disc disease of the spine. IMPRESSION: Nonobstructive bowel gas pattern. Electronically Signed   By: Rogelia Myers M.D.   On: 05/18/2024 17:48   CT GUIDED NEEDLE PLACEMENT Result Date: 05/18/2024 INDICATION: [id number] Pancreatic pseudocyst [id number]. Diagnostic aspiration requested. EXAM: CT-GUIDED PERIPANCREATIC FLUID COLLECTION ASPIRATION COMPARISON:  CT CAP, 05/17/2024.  MR ABDOMEN, 05/17/2024. MEDICATIONS: The patient is currently admitted to the hospital and receiving intravenous antibiotics. The antibiotics were administered within an appropriate time frame prior to the initiation of the procedure. ANESTHESIA/SEDATION: Local anesthetic was administered. CONTRAST:  None FLUOROSCOPY TIME:  CT dose; 977 mGycm COMPLICATIONS: None immediate. PROCEDURE: RADIATION DOSE REDUCTION: This exam was performed according to the departmental dose-optimization program which includes automated exposure control, adjustment of the mA and/or kV according to patient size and/or use of iterative reconstruction technique. Informed written consent was obtained from the patient after a discussion of the risks, benefits and alternatives to treatment. The patient was placed supine on the CT gantry and a pre procedural CT was performed re-demonstrating the known fluid collection within the LEFT upper quadrant. The procedure was planned. A timeout was performed prior to the initiation of the procedure. The LEFT upper quadrant was prepped and draped in the usual sterile fashion. The overlying soft tissues were anesthetized with 1% lidocaine  with epinephrine . Appropriate trajectory was  planned with the use of a 22 gauge spinal needle. An 18 gauge trocar needle was advanced into the collection and appropriate positioning was confirmed with a limited CT scan. 100 mL of tan brown fluid  was aspirated. The needle was removed then a dressing was placed. The patient tolerated the procedure well without immediate post procedural complication. IMPRESSION: Successful CT-guided diagnostic aspiration of a peripancreatic fluid collection yielding 100 mL of 10 brown fluid. Samples were sent to the laboratory as requested by the ordering clinical team. Thom Hall, MD [facility] Electronically Signed   By: Thom Hall M.D.   On: 05/18/2024 17:14   MR ABDOMEN MRCP W WO CONTAST Result Date: 05/18/2024 CLINICAL DATA:  ACUTE PANCREATITIS. EXAM: MRI ABDOMEN WITHOUT AND WITH CONTRAST (INCLUDING MRCP) TECHNIQUE: Multiplanar multisequence MR imaging of the abdomen was performed both before and after the administration of intravenous contrast. Heavily T2-weighted images of the biliary and pancreatic ducts were obtained, and three-dimensional MRCP images were rendered by post processing. CONTRAST:  10mL GADAVIST  GADOBUTROL  1 MMOL/ML IV SOLN COMPARISON:  CT SCAN 05/17/2024 FINDINGS: Lower chest: Left base atelectasis with small left pleural effusion. Hepatobiliary: No focal abnormality identified in the liver. No substantial intrahepatic biliary duct dilatation. Gallbladder surgically absent. Common duct measures 11 mm in the hepatoduodenal ligament. Common bile duct measures 6 mm diameter just proximal to the ampulla. Pancreas: Relatively unremarkable appearance of the pancreatic head. Pancreatic parenchyma appears to enhance throughout with no definite MR features of pancreatic necrosis. There is edema in the body and tail of pancreas. No main duct dilatation. As seen on the recent CT there is a large complex cystic process in the left upper quadrant  involving the wall of the proximal stomach, pancreatic tail, subdiaphragmatic space, and dome of the spleen. Spleen: Irregularity of the splenic capsule in the posterior dome of the spleen suggest disruption by the complex cystic process. Adrenals/Urinary Tract: No adrenal nodule or mass. Kidneys unremarkable. Stomach/Bowel: Complex fluid dissects in the wall of the proximal stomach. Duodenum unremarkable. No small bowel or colonic dilatation within the visualized abdomen. Vascular/Lymphatic: Portal vein and superior mesenteric vein are patent. 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 Celiac axis and SMA are patent. Splenic artery appears to be patent. Upper normal lymph nodes in the hepatoduodenal ligament. Other: Irregular somewhat tubular rimr enhancing fluid collection in left paracolic gutter measures on the order of 10.4 x 2.5 x 4.1 cm Musculoskeletal: No focal suspicious marrow enhancement within the visualized bony anatomy. Advanced degenerative changes are seen at the level of the L3-4 disc space. IMPRESSION: 1. Large complex cystic process in the left upper quadrant involving the wall of the proximal stomach, pancreatic tail, subdiaphragmatic space, and dome of the spleen. Imaging features are compatible with sequelae of pancreatitis with pseudocyst formation. Dominant collection in the subdiaphragmatic space measures on the order of 12.5 x 12.7 x 7.9 cm. Superinfection not excluded by imaging. No definite MR features of pancreatic necrosis. 2. Status post cholecystectomy with mild dilatation of the common duct in the hepatoduodenal ligament, likely related to postcholecystectomy state. Common bile duct nondilated just proximal to the ampulla. No choledocholithiasis. 3. Irregular somewhat tubular rim enhancing fluid collection in the left paracolic gutter measures on the order of 10.4 x 2.5 x 4.1 cm. Imaging features are compatible with pseudocyst. Superinfection not excluded by imaging. 4.  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 5. Left base atelectasis with small left pleural effusion. Electronically Signed   By: Camellia Candle M.D.   On: 05/18/2024 05:32   MR 3D Recon At Scanner Result Date: 05/18/2024 CLINICAL DATA:  ACUTE PANCREATITIS. EXAM: MRI ABDOMEN WITHOUT AND WITH CONTRAST (INCLUDING  MRCP) TECHNIQUE: Multiplanar multisequence MR imaging of the abdomen was performed both before and after the administration of intravenous contrast. Heavily T2-weighted images of the biliary and pancreatic ducts were obtained, and three-dimensional MRCP images were rendered by post processing. CONTRAST:  10mL GADAVIST  GADOBUTROL  1 MMOL/ML IV SOLN COMPARISON:  CT SCAN 05/17/2024 FINDINGS: Lower chest: Left base atelectasis with small left pleural effusion. Hepatobiliary: No focal abnormality identified in the liver. No substantial intrahepatic biliary duct dilatation. Gallbladder surgically absent. Common duct measures 11 mm in the hepatoduodenal ligament. Common bile duct measures 6 mm diameter just proximal to the ampulla. Pancreas: Relatively unremarkable appearance of the pancreatic head. Pancreatic parenchyma appears to enhance throughout with no definite MR features of pancreatic necrosis. There is edema in the body and tail of pancreas. No main duct dilatation. As seen on the recent CT there is a large complex cystic process in the left upper quadrant involving the wall of the proximal stomach, pancreatic tail, subdiaphragmatic space, and dome of the spleen. Spleen: Irregularity of the splenic capsule in the posterior dome of the spleen suggest disruption by the complex cystic process. Adrenals/Urinary Tract: No adrenal nodule or mass. Kidneys unremarkable. Stomach/Bowel: Complex fluid dissects in the wall of the proximal stomach. Duodenum unremarkable. No small bowel or colonic dilatation within the visualized abdomen. Vascular/Lymphatic: Portal vein and superior mesenteric vein are patent.  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 Celiac axis and SMA are patent. Splenic artery appears to be patent. Upper normal lymph nodes in the hepatoduodenal ligament. Other: Irregular somewhat tubular rimr enhancing fluid collection in left paracolic gutter measures on the order of 10.4 x 2.5 x 4.1 cm Musculoskeletal: No focal suspicious marrow enhancement within the visualized bony anatomy. Advanced degenerative changes are seen at the level of the L3-4 disc space. IMPRESSION: 1. Large complex cystic process in the left upper quadrant involving the wall of the proximal stomach, pancreatic tail, subdiaphragmatic space, and dome of the spleen. Imaging features are compatible with sequelae of pancreatitis with pseudocyst formation. Dominant collection in the subdiaphragmatic space measures on the order of 12.5 x 12.7 x 7.9 cm. Superinfection not excluded by imaging. No definite MR features of pancreatic necrosis. 2. Status post cholecystectomy with mild dilatation of the common duct in the hepatoduodenal ligament, likely related to postcholecystectomy state. Common bile duct nondilated just proximal to the ampulla. No choledocholithiasis. 3. Irregular somewhat tubular rim enhancing fluid collection in the left paracolic gutter measures on the order of 10.4 x 2.5 x 4.1 cm. Imaging features are compatible with pseudocyst. Superinfection not excluded by imaging. 4. 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 5. Left base atelectasis with small left pleural effusion. Electronically Signed   By: Camellia Candle M.D.   On: 05/18/2024 05:32   CT CHEST ABDOMEN PELVIS W CONTRAST Result Date: 05/17/2024 CLINICAL DATA:  Sepsis EXAM: CT CHEST, ABDOMEN, AND PELVIS WITH CONTRAST TECHNIQUE: Multidetector CT imaging of the chest, abdomen and pelvis was performed following the standard protocol during bolus administration of intravenous contrast. RADIATION DOSE REDUCTION: This exam was performed according to  the departmental dose-optimization program which includes automated exposure control, adjustment of the mA and/or kV according to patient size and/or use of iterative reconstruction technique. CONTRAST:  65mL OMNIPAQUE  IOHEXOL  350 MG/ML SOLN COMPARISON:  CT abdomen pelvis 04/25/2024 FINDINGS: CT CHEST FINDINGS Cardiovascular: Normal heart size. No significant pericardial effusion. The thoracic aorta is normal in caliber. At least single vessel atherosclerotic plaque of the thoracic aorta. Mild  coronary artery calcifications. Mediastinum/Nodes: No enlarged mediastinal, hilar, or axillary lymph nodes. Thyroid gland, trachea, and esophagus demonstrate no significant findings. Lungs/Pleura: Left lower lobe atelectasis. No pulmonary nodule. No pulmonary mass. Slight interval increase of trace left pleural effusion. No pneumothorax. Musculoskeletal: No chest wall abnormality. No suspicious lytic or blastic osseous lesions. No acute displaced fracture. Multilevel mild degenerative changes of the spine. Partially visualized cervicothoracic spine demonstrates surgical hardware. CT ABDOMEN PELVIS FINDINGS Hepatobiliary: No focal liver abnormality. Status post cholecystectomy. No biliary dilatation. Pancreas: Interval increase in size of a large lobulated and multiloculated left upper quadrant cystic lesion arising from the pancreatic tail that again appears to be inseparable from the greater curvature of the stomach and the spleen. Finding demonstrates thick enhancing walls. The largest component measures 15 x 10.5 cm (from 10 x 6 cm). The cystic lesion abuts to the left hemidiaphragm and pushes the hemidiaphragm up. Slightly hazy pancreatic tail contour with associated peripancreatic fat stranding. No main pancreatic ductal dilatation. Spleen: Interval concave scalloping of the splenic capsule with suggestion of invasion of the left upper quadrant cystic lesion. Interval development of adjacent punctate hypodensity (5:50).  Spleen is normal in size. Adrenals/Urinary Tract: No adrenal nodule bilaterally. Bilateral kidneys enhance symmetrically. No hydronephrosis. No hydroureter. The urinary bladder is unremarkable. Stomach/Bowel: Stomach is within normal limits. No evidence of bowel wall thickening or dilatation. Vague fat stranding along the splenic flexure likely reactive changes with left upper quadrant abscess adjacent but not be inseparable (5:92). Appendix appears normal. Vascular/Lymphatic: No abdominal aorta or iliac aneurysm. Mild atherosclerotic plaque of the aorta and its branches. No abdominal, pelvic, or inguinal lymphadenopathy. Reproductive: T-shaped intrauterine device in appropriate position. Uterus and bilateral adnexa are unremarkable. Other: No intraperitoneal free fluid. No intraperitoneal free gas. No organized fluid collection. Musculoskeletal: No abdominal wall hernia or abnormality. No suspicious lytic or blastic osseous lesions. No acute displaced fracture. Multilevel degenerative changes of the spine. Stable grade 2 anterolisthesis of L3 on L4 with endplate sclerosis and intervertebral disc space vacuum phenomenon. IMPRESSION: 1. Persistent acute pancreatitis with interval increase in size of a (up to 15 x 10.5 cm on axial imaging) pancreatic tail abscess that extends along the left upper quadrant is inseparable from the greater curvature of the stomach, spleen, inferior aspect of the left hemidiaphragm. Findings suggestive of invasion into the splenic capsule with possible adjacent second abscess forming within the splenic parenchyma-too small to characterize, recommend attention on follow-up. When the patient is clinically stable and able to follow directions and hold their breath (preferably as an outpatient) further evaluation with dedicated abdominal MRI pancreatic protocol should be considered. 2. Slight interval increase of trace left pleural effusion. 3. Persistent elevated left hemidiaphragm with left  base atelectasis. 4.  Aortic Atherosclerosis (ICD10-I70.0). 5. shaped intrauterine device in appropriate position. 6. 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 Electronically Signed   By: Morgane  Naveau M.D.   On: 05/17/2024 17:16   CT ABDOMEN W CONTRAST Result Date: 04/25/2024 CLINICAL DATA:  Pancreatitis, persistent developing complication in patient with newly recurring epigastric pain and nausea with known subacute pancreatitis with pseudocysts. EXAM: CT ABDOMEN WITH CONTRAST TECHNIQUE: Multidetector CT imaging of the abdomen was performed using the standard protocol following bolus administration of intravenous contrast. RADIATION DOSE REDUCTION: This exam was performed according to the departmental dose-optimization program which includes automated exposure control, adjustment of the mA and/or kV according to patient size and/or use of iterative reconstruction technique. CONTRAST:  75mL OMNIPAQUE  IOHEXOL  350 MG/ML SOLN COMPARISON:  CT chest abdomen pelvis 04/15/2024 FINDINGS: Lower chest: Interval decrease in size of a trace left pleural effusion. Left lower lobe atelectasis. Hepatobiliary: No focal liver abnormality. Status post cholecystectomy. No biliary dilatation. Pancreas: No focal lesion. Persistent left upper quadrant mild fat stranding. No surrounding inflammatory changes. Main pancreatic duct dilatation. Large lobulated complex left upper quadrant cystic lesion arising from the pancreatic tail. The known fluid collection is markedly difficult to measure due to loculations and irregularity with interval increase in its largest component measuring 9.8 x 5.9 cm (from 4.8 x 2.1 cm) and 9.2 x 4.7 cm (from 8.6 x 3 cm). Finding extends along the greater curvature of the stomach. Spleen: Normal in size without focal abnormality other than in several fluid density lesion described above. Adrenals/visualized portions of the urinary Tract: No adrenal nodule  bilaterally. Bilateral kidneys enhance symmetrically. No hydronephrosis. No hydroureter. On delayed imaging, there is no urothelial wall thickening and there are no filling defects in the opacified portions of the bilateral collecting systems or ureters. Stomach/visualized portions of the bowel: Stomach is decompressed with fluid density lesions in several wall from the gastric walls. No definite fistulization with the gastric lumen. No evidence of bowel wall thickening or dilatation. Vascular/Lymphatic: No abdominal aorta aneurysm. Mild atherosclerotic plaque of the aorta and its branches. No abdominal lymphadenopathy. Other: No intraperitoneal free fluid. No intraperitoneal free gas. No organized fluid collection. Musculoskeletal: No abdominal wall hernia or abnormality. No suspicious lytic or blastic osseous lesions. No acute displaced fracture. IMPRESSION: 1. Acute pancreatitis with main pancreatic duct dilatation and interval increase in large lobulated complex left upper quadrant cystic lesion arising from the pancreatic tail. The known fluid collection is markedly difficult to measure due to loculations and irregularity with interval increase in its largest component measuring 9.8 x 5.9 cm (from 4.8 x 2.1 cm) and 9.2 x 4.7 cm (from 8.6 x 3 cm). Findings suggestive of an infected pseudocyst or abscess extending along the greater curvature of the stomach. Underlying malignancy cannot be fully excluded. When the patient is clinically stable and able to follow directions and hold their breath (preferably as an outpatient) further evaluation with dedicated abdominal MRI pancreatic protocol should be considered. 2. Interval decrease in trace left pleural effusion. Electronically Signed   By: Morgane  Naveau M.D.   On: 04/25/2024 17:31    No new imaging to review  Previous records (including but not limited to H&P, progress notes, nursing notes, TOC management) were reviewed in assessment of this  patient.  Labs: CBC: Recent Labs  Lab 05/18/24 1644 05/19/24 0428 05/19/24 1641 05/20/24 0402 05/20/24 1721 05/21/24 0358 05/21/24 1610 05/22/24 0406 05/22/24 1700 05/23/24 0535 05/23/24 1637 05/24/24 0434 05/25/24 0547  WBC 15.6* 12.5*  --  12.9*  --  10.0  --  9.3  --  11.8*  --   --  11.6*  NEUTROABS 13.4* 9.8*  --   --   --  6.2  --   --   --  7.9*  --   --   --   HGB 12.6 11.9*   < > 10.7*   < > 9.7*   < > 9.7* 10.3* 10.3* 10.6* 10.3* 10.3*  HCT 40.7 38.2   < > 33.5*   < > 31.2*   < > 31.5* 32.9* 32.7* 33.8* 32.6* 32.7*  MCV 82.2 80.6  --  82.1  --  80.6  --  80.6  --  80.1  --   --  80.1  PLT 619* 504*  --  433*  --  438*  --  473*  --  482*  --   --  530*   < > = values in this interval not displayed.   Basic Metabolic Panel: Recent Labs  Lab 05/19/24 0428 05/20/24 0402 05/21/24 0358 05/23/24 0535 05/25/24 0547  NA 133* 134* 136 137 136  K 3.9 3.8 3.5 3.5 4.0  CL 103 105 103 105 101  CO2 22 20* 24 23 24   GLUCOSE 118* 101* 86 117* 117*  BUN 12 14 10 6 6   CREATININE 0.79 0.69 0.86 0.61 0.73  CALCIUM 8.3* 8.1* 8.4* 8.7* 9.1  MG 1.8 1.7  --   --  1.7   Liver Function Tests: Recent Labs  Lab 05/19/24 0428 05/20/24 0402  AST 7* 9*  ALT 8 5  ALKPHOS 65 56  BILITOT 0.8 0.2  PROT 5.6* 5.4*  ALBUMIN 2.1* 1.9*   CBG: Recent Labs  Lab 05/24/24 1127 05/24/24 1645 05/24/24 2046 05/25/24 0758 05/25/24 1131  GLUCAP 94 112* 164* 124* 98    Scheduled Meds:  ciprofloxacin   500 mg Oral BID   feeding supplement  237 mL Oral BID BM   insulin  aspart  0-9 Units Subcutaneous TID WC   multivitamin with minerals  1 tablet Oral Daily   nicotine   14 mg Transdermal Daily   pantoprazole   40 mg Oral Q1200   sodium chloride  flush  3 mL Intravenous Q12H   thiamine   100 mg Oral Daily   Continuous Infusions: PRN Meds:.acetaminophen , albuterol , diphenhydrAMINE , HYDROmorphone  (DILAUDID ) injection, labetalol , melatonin, nicotine  polacrilex, ondansetron  (ZOFRAN ) IV,  oxyCODONE  **OR** oxyCODONE , polyethylene glycol  Family Communication: None at bedside  Disposition: Status is: Inpatient Remains inpatient appropriate because: Pancreatitis with pseudocyst     Time spent: 36 minutes  Length of inpatient stay: 8 days  Author: Carliss LELON Canales, DO 05/25/2024 1:43 PM  For on call review [email or web address].

## 2024-05-25 NOTE — Plan of Care (Signed)
  Problem: Health Behavior/Discharge Planning: Goal: Ability to manage health-related needs will improve Outcome: Progressing   Problem: Clinical Measurements: Goal: Ability to maintain clinical measurements within normal limits will improve Outcome: Progressing Goal: Will remain free from infection Outcome: Progressing Goal: Diagnostic test results will improve Outcome: Progressing Goal: Respiratory complications will improve Outcome: Progressing Goal: Cardiovascular complication will be avoided Outcome: Progressing   Problem: Nutrition: Goal: Adequate nutrition will be maintained Outcome: Progressing   Problem: Coping: Goal: Level of anxiety will decrease Outcome: Progressing   Problem: Elimination: Goal: Will not experience complications related to bowel motility Outcome: Progressing Goal: Will not experience complications related to urinary retention Outcome: Progressing   Problem: Pain Managment: Goal: General experience of comfort will improve and/or be controlled Outcome: Progressing   Problem: Safety: Goal: Ability to remain free from injury will improve Outcome: Progressing   Problem: Skin Integrity: Goal: Risk for impaired skin integrity will decrease Outcome: Progressing   Problem: Education: Goal: Ability to describe self-care measures that may prevent or decrease complications (Diabetes Survival Skills Education) will improve Outcome: Progressing Goal: Individualized Educational Video(s) Outcome: Progressing   Problem: Coping: Goal: Ability to adjust to condition or change in health will improve Outcome: Progressing   Problem: Fluid Volume: Goal: Ability to maintain a balanced intake and output will improve Outcome: Progressing   Problem: Health Behavior/Discharge Planning: Goal: Ability to identify and utilize available resources and services will improve Outcome: Progressing Goal: Ability to manage health-related needs will improve Outcome:  Progressing   Problem: Metabolic: Goal: Ability to maintain appropriate glucose levels will improve Outcome: Progressing   Problem: Nutritional: Goal: Maintenance of adequate nutrition will improve Outcome: Progressing Goal: Progress toward achieving an optimal weight will improve Outcome: Progressing   Problem: Skin Integrity: Goal: Risk for impaired skin integrity will decrease Outcome: Progressing   Problem: Tissue Perfusion: Goal: Adequacy of tissue perfusion will improve Outcome: Progressing

## 2024-05-26 DIAGNOSIS — R630 Anorexia: Secondary | ICD-10-CM | POA: Diagnosis not present

## 2024-05-26 DIAGNOSIS — R1012 Left upper quadrant pain: Secondary | ICD-10-CM | POA: Diagnosis not present

## 2024-05-26 DIAGNOSIS — K863 Pseudocyst of pancreas: Secondary | ICD-10-CM | POA: Diagnosis not present

## 2024-05-26 DIAGNOSIS — K8689 Other specified diseases of pancreas: Secondary | ICD-10-CM | POA: Diagnosis not present

## 2024-05-26 DIAGNOSIS — K859 Acute pancreatitis without necrosis or infection, unspecified: Secondary | ICD-10-CM | POA: Diagnosis not present

## 2024-05-26 LAB — COMPREHENSIVE METABOLIC PANEL WITH GFR
ALT: 10 U/L (ref 0–44)
AST: 12 U/L — ABNORMAL LOW (ref 15–41)
Albumin: 2.4 g/dL — ABNORMAL LOW (ref 3.5–5.0)
Alkaline Phosphatase: 57 U/L (ref 38–126)
Anion gap: 11 (ref 5–15)
BUN: 11 mg/dL (ref 6–20)
CO2: 24 mmol/L (ref 22–32)
Calcium: 8.6 mg/dL — ABNORMAL LOW (ref 8.9–10.3)
Chloride: 104 mmol/L (ref 98–111)
Creatinine, Ser: 0.72 mg/dL (ref 0.44–1.00)
GFR, Estimated: >60 mL/min (ref >60)
Glucose, Bld: 109 mg/dL — ABNORMAL HIGH (ref 70–99)
Potassium: 3.6 mmol/L (ref 3.5–5.1)
Sodium: 139 mmol/L (ref 135–145)
Total Bilirubin: 0.3 mg/dL (ref 0.0–1.2)
Total Protein: 5.7 g/dL — ABNORMAL LOW (ref 6.5–8.1)

## 2024-05-26 LAB — CBC
HCT: 33.2 % — ABNORMAL LOW (ref 36.0–46.0)
Hemoglobin: 10.3 g/dL — ABNORMAL LOW (ref 12.0–15.0)
MCH: 25.2 pg — ABNORMAL LOW (ref 26.0–34.0)
MCHC: 31 g/dL (ref 30.0–36.0)
MCV: 81.2 fL (ref 80.0–100.0)
Platelets: 505 K/uL — ABNORMAL HIGH (ref 150–400)
RBC: 4.09 MIL/uL (ref 3.87–5.11)
RDW: 14.6 % (ref 11.5–15.5)
WBC: 15.2 K/uL — ABNORMAL HIGH (ref 4.0–10.5)
nRBC: 0 % (ref 0.0–0.2)

## 2024-05-26 LAB — SURGICAL PATHOLOGY

## 2024-05-26 LAB — MAGNESIUM: Magnesium: 1.8 mg/dL (ref 1.7–2.4)

## 2024-05-26 LAB — GLUCOSE, CAPILLARY
Glucose-Capillary: 92 mg/dL (ref 70–99)
Glucose-Capillary: 81 mg/dL (ref 70–99)
Glucose-Capillary: 93 mg/dL (ref 70–99)

## 2024-05-26 MED ORDER — HYDROCODONE-ACETAMINOPHEN 10-325 MG PO TABS
1.0000 | ORAL_TABLET | ORAL | Status: DC | PRN
  Administered 2024-05-26 – 2024-05-29 (×10): 1 via ORAL
  Filled 2024-05-26 (×10): qty 1

## 2024-05-26 MED ORDER — KETOROLAC TROMETHAMINE 15 MG/ML IJ SOLN
15.0000 mg | Freq: Three times a day (TID) | INTRAMUSCULAR | Status: AC
  Administered 2024-05-26 – 2024-05-27 (×5): 15 mg via INTRAVENOUS
  Filled 2024-05-26 (×5): qty 1

## 2024-05-26 MED ORDER — HYDROMORPHONE HCL 1 MG/ML IJ SOLN
0.5000 mg | Freq: Four times a day (QID) | INTRAMUSCULAR | Status: DC | PRN
  Administered 2024-05-26 – 2024-05-29 (×10): 0.5 mg via INTRAVENOUS
  Filled 2024-05-26 (×10): qty 0.5

## 2024-05-26 NOTE — Progress Notes (Signed)
 Nutrition Follow-up  DOCUMENTATION CODES:   Non-severe (moderate) malnutrition in context of acute illness/injury  INTERVENTION:   -Case discussed with MD; advanced to heart healthy diet today and assess for tolerance and PO adequacy -Continue Ensure Plus High Protein po BID, each supplement provides 350 kcal and 20 grams of protein  -Continue Magic cup TID with meals, each supplement provides 290 kcal and 9 grams of protein  -Continue MVI with minerals daily  -Continue 100 mg thiamine  daily x 7 days -Monitor magnesium , potassium, and phosphorus daily for at least 3 days  NUTRITION DIAGNOSIS:   Moderate Malnutrition related to acute illness as evidenced by percent weight loss, mild muscle depletion.  Ongoing  GOAL:   Patient will meet greater than or equal to 90% of their needs  Progressing   MONITOR:   PO intake, Supplement acceptance, Diet advancement, Labs  REASON FOR ASSESSMENT:   NPO/Clear Liquid Diet    ASSESSMENT:   Pt with PMH significant for: T2DM, HTN, asthma, IDA, morbid obesity, B12 deficiency, cervical myelopathy, tobacco use disorder. Presented to [facility] with worsening intermittent abdominal pain and persistent N/V since her recent discharge. Of note, she was hospitalized at [facility] from 5/3-5/6  for idiopathic pancreatitis and at [facility] from 5/31 - 6/11 for recurrent pancreatitis w/ pseudocyst c/b hospital acquired PNA requiring chest tube placement. 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  Pt unavailable at time of visit. Attempted to speak with pt via call to hospital room phone, however, unable to reach. Per chart review, pt consumed 100% of full liquid diet for dinner last night and also drank Ensure.   Case discussed with RN, who reports pt with good appetite and tolerating full liquids well. Per RN, pt is now tired and wants to take a nap. She consumed 100% of soup, jello, and ice pop this morning and is open to advancing diet.   Reached out to hospitalist and GI team. Per hospitalist, likely plan for diet advancement today, however, deferring decision to GI. GI reports that pt will be advanced to a heart healthy diet today. Plan to follow-up with Dr. Wilhelmenia regarding re-imaging.   Wt has been stable since admission.   Medications reviewed and include cipro , protonix , and thiamine .   Labs reviewed: Mg, K, and Phos WDL. CBGS: 92-164 (inpatient orders for glycemic control are 0-9 units insulin  aspart TID with meals).    Diet Order:   Diet Order             Diet Heart Fluid consistency: Thin  Diet effective now                   EDUCATION NEEDS:   Education needs have been addressed  Skin:  Skin Assessment: Reviewed RN Assessment  Last BM:  05/22/24  Height:   Ht Readings from Last 1 Encounters:  05/24/24 5' 5 (1.651 m)  Weight:   Wt Readings from Last 1 Encounters:  05/25/24 108 kg    Ideal Body Weight:  56.8 kg  BMI:  Body mass index is 39.62 kg/m.  Estimated Nutritional Needs:   Kcal:  1800-2000 kcals  Protein:  90-105g  Fluid:  1.8-2.0L/day    Margery ORN, RD, LDN, CDCES Registered Dietitian III Certified Diabetes Care and Education Specialist If unable to reach this RD, please use RD Inpatient group chat on secure chat between hours of 8am-4 pm daily

## 2024-05-26 NOTE — Progress Notes (Addendum)
 Daily Progress Note  DOA: 05/17/2024 Hospital Day: 10   Cc: Upper abdominal pain and pseudocyst  Brief History:  38 y.o. year old female with a medical history including but not limited to   ASSESSMENT    38 yo female with Idiopathic pancreatitis occurred at outside hospital in May of this year   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 S/p EUS with cyst drainage.05/24/24.  Multiple cystic lesions were seen in the pancreatic body, pancreatic tail and peripancreatic region and perisplenic regions. The endosonographic appearance was highly suspicious for pancreatic pseudocysts with some cysts within the splenic region to possibly have had hemorrhage, 800 ml fluid aspirated from large cystic lesion   >>TODAY She feels okay, still has some discomfort in her left upper abdomen and back requiring pain meds.  No nausea or vomiting.  WBC is up slightly overnight from 11.6 to 15K.  Afebrile.  Hematocrit stable at 10.3  Principal Problem:   Pancreatic abscess Active Problems:   Acute pancreatitis   Pleural effusion on left   Left upper quadrant pain   Nausea in adult   Encounter for smoking cessation counseling   Acute pancreatic fluid collection   Malnutrition of moderate degree   Pancreatic pseudocyst   Gastritis without bleeding   PLAN   --Cytology pending --Encouraged ambulation --Follow-up imaging to be determined based on clinical course.  --Will try low fat diet.  Hopefully need for pain meds will start decreasing   Subjective   She feels okay, still has some discomfort in her left upper abdomen and back requiring pain meds.  No nausea or vomiting.   Objective      Recent Labs    05/24/24 0434 05/25/24 0547 05/25/24 0547 05/26/24 0536  WBC  --  11.6*  --  15.2*  HGB 10.3* 10.3*  --  10.3*  HCT 32.6* 32.7*  --  33.2*  MCV  --  80.1   < > 81.2  PLT  --   530*  --  505*   < > = values in this interval not displayed.   No results for input(s): FOLATE, VITAMINB12, FERRITIN, TIBC, IRONPCTSAT in the last 72 hours. Recent Labs    05/25/24 0547 05/26/24 0536  NA 136 139  K 4.0 3.6  CL 101 104  CO2 24 24  GLUCOSE 117* 109*  BUN 6 11  CREATININE 0.73 0.72  CALCIUM 9.1 8.6*   Recent Labs    05/26/24 0536  PROT 5.7*  ALBUMIN 2.4*  AST 12*  ALT 10  ALKPHOS 57  BILITOT 0.3    Imaging:  CT ABDOMEN PELVIS W CONTRAST CLINICAL DATA:  38 year old female with history of pancreatic pseudocyst status post image guided aspiration on 05/18/2024.  EXAM: CT ABDOMEN AND PELVIS WITH CONTRAST  TECHNIQUE: Multidetector CT imaging of the abdomen and pelvis was performed using the standard protocol following bolus administration of intravenous contrast.  RADIATION DOSE REDUCTION: This exam was performed according to the departmental dose-optimization program which includes automated exposure control, adjustment of the mA and/or kV according to patient size and/or use of iterative reconstruction technique.  CONTRAST:  75mL OMNIPAQUE  IOHEXOL  350 MG/ML SOLN  COMPARISON:  05/17/2024  FINDINGS: Lower chest: Similar appearing small left pleural effusion with associated left basilar passive atelectasis.  Hepatobiliary: No focal liver abnormality is seen. Status post cholecystectomy. No biliary dilatation.  Pancreas: Similar normal appearing head and body of the pancreas  with large multiloculated cystic lesion extending from the pancreatic tail superiorly to the greater curvature of the stomach and perisplenic regions. The collection is overall similar to most recent comparison CT.  Spleen: Splenic parenchyma is normal in size enhancement. Again seen is a large subcapsular appearing splenic cystic lesion with peripheral enhancement arising from the superior aspect which measures up to approximately 13.6 x 8.0 cm in maximum  axial dimensions.  Adrenals/Urinary Tract: Adrenal glands are unremarkable. Kidneys are normal, without renal calculi, focal lesion, or hydronephrosis. Bladder is unremarkable.  Stomach/Bowel: Stomach is within normal limits, nearly collapsed extrinsic compression from neighboring pancreatic cysts. Appendix appears normal. No evidence of bowel wall thickening, distention, or inflammatory changes.  Vascular/Lymphatic: No significant vascular findings are present. No enlarged abdominal or pelvic lymph nodes.  Reproductive: Intrauterine device in place in unchanged position. Uterus and bilateral adnexa are otherwise unremarkable.  Other: Interval development of crescentic small volume ascites in the left upper quadrant.  Musculoskeletal: No acute osseous abnormality. Similar appearing advanced discogenic degenerative change at L3-L4 with unchanged anterolisthesis at this level.  IMPRESSION: 1. Similar appearing large multiloculated cystic lesion extending from the pancreatic tail superiorly to the greater curvature of the stomach and perisplenic regions. The collection is overall similar to most recent comparison CT. 2. Interval development of crescentic small volume ascites in the left upper quadrant. Suspect pancreatic pseudocyst contents after recent instrumentation. 3. Similar appearing small left pleural effusion with associated left basilar passive atelectasis.  Ester Sides, MD  [facility]  [facility]  Electronically Signed   By: Ester Sides M.D.   On: 05/21/2024 21:23     Scheduled inpatient medications:   ciprofloxacin   500 mg Oral BID   feeding supplement  237 mL Oral BID BM   insulin  aspart  0-9 Units Subcutaneous TID WC   multivitamin with minerals  1 tablet Oral Daily   nicotine   14 mg Transdermal Daily   pantoprazole   40 mg Oral Q1200   sodium chloride  flush  3 mL Intravenous Q12H   thiamine   100 mg  Oral Daily   Continuous inpatient infusions:  PRN inpatient medications: acetaminophen , albuterol , diphenhydrAMINE , HYDROmorphone  (DILAUDID ) injection, labetalol , melatonin, nicotine  polacrilex, ondansetron  (ZOFRAN ) IV, oxyCODONE  **OR** oxyCODONE , polyethylene glycol  Vital signs in last 24 hours: Temp:  [98 F (36.7 C)-98.5 F (36.9 C)] 98.5 F (36.9 C) (07/11 0727) Pulse Rate:  [68-93] 89 (07/11 0727) Resp:  [18] 18 (07/11 0727) BP: (138-159)/(86-110) 142/90 (07/11 0855) SpO2:  [96 %-100 %] 97 % (07/11 0727) Last BM Date : 05/22/24  Intake/Output Summary (Last 24 hours) at 05/26/2024 1107 Last data filed at 05/26/2024 0209 Gross per 24 hour  Intake 243 ml  Output --  Net 243 ml    Intake/Output from previous day: 07/10 0701 - 07/11 0700 In: 246 [P.O.:240; I.V.:6] Out: -  Intake/Output this shift: No intake/output data recorded.   Physical Exam:  General: Alert female in NAD Heart:  Regular rate and rhythm.  Pulmonary: Normal respiratory effort Abdomen: Soft, mild LUQ tenderness, nondistended, Normal bowel sounds. Extremities: No lower extremity edema  Neurologic: Alert and oriented Psych: Pleasant. Cooperative     LOS: 9 days   Vina Dasen ,NP 05/26/2024, 11:07 AM  I have taken an interval history, thoroughly reviewed the chart and examined the patient. I agree with the Advanced Practitioner's note, impression and recommendations, and have recorded additional findings, impressions and recommendations below. I performed a substantive portion of this encounter (>50% time  spent), including a complete performance of the medical decision making.  My additional thoughts are as follows:  Feels about the same as yesterday, and she thinks she may want to slowly advance to a solid diet later today.  We can certainly see how it goes and adjust plans accordingly.  I will convey her current condition to Dr. Wilhelmenia and get his opinion on when he would like to reimage  her.  Might also help to see how well she tolerates a diet advancement before making that decision.  Remain hospitalized for now with other supportive care in hopes she makes slow clinical progress.   Victory LITTIE Brand III Office:[phone]

## 2024-05-26 NOTE — TOC Progression Note (Signed)
 Transition of Care [facility]) - Progression Note    Patient Details  Name: Katrina Lambert MRN: [id number] Date of Birth: 08-30-1986  [facility] CM/SW Contact  Tom-Johnson, Crimson [employer] Daphne, RN Phone Number: 05/26/2024, 3:17 PM  Clinical Narrative:     Patient's Abdominal pain improved, tolerating Full Liquid Diet. WBC slightly elevated from 11.6 to 15K. Transitioned to Oral Cipro , continues pain management.   Patient not Medically ready for discharge.  CM will continue to follow for discharge needs as patient progresses with care towards discharge.           Expected Discharge Plan and Services                                               Social Determinants of Health (SDOH) Interventions SDOH Screenings   Food Insecurity: No Food Insecurity (05/18/2024)  Housing: Low Risk  (05/18/2024)  Transportation Needs: No Transportation Needs (05/18/2024)  Recent Concern: Transportation Needs - Unmet Transportation Needs (03/18/2024)   Received from [facility]  Utilities: Not At Risk (05/18/2024)  Recent Concern: Utilities - High Risk (03/20/2024)   Received from [facility]  Financial Resource Strain: Low Risk  (03/18/2024)   Received from [facility]  Physical Activity: Inactive (03/18/2024)   Received from [facility]  Social Connections: Socially Isolated (03/18/2024)   Received from [facility]  Stress: Stress Concern Present (03/18/2024)   Received from [facility]  Tobacco Use: High Risk (05/24/2024)  Health Literacy: Low Risk  (03/18/2024)   Received from [facility]    Readmission Risk Interventions    05/18/2024    2:37 PM  Readmission Risk Prevention Plan  Transportation Screening Complete  PCP or Specialist Appt within 5-7 Days Complete  Home Care Screening Complete  Medication Review (RN CM) Referral to Pharmacy

## 2024-05-26 NOTE — Progress Notes (Signed)
 Progress Note   Patient: Katrina Lambert FMW:[id number] DOB: [birth year] DOA: 05/17/2024  DOS: the patient was seen and examined on 05/26/2024   Brief hospital course:  38 year old F with PMH of DM-2, HTN, asthma, IDA, morbid obesity, B12 deficiency, cervical myelopathy, tobacco use disorder and recent hospitalization at [facility] from 5/3-5/6 for idiopathic pancreatitis and at [facility] 5/31/611 for recurrent pancreatitis with pseudocyst as well as hospital-acquired pneumonia complicated by parapneumonic effusion requiring chest tube placement returning to ED with worsening intermittent abdominal pain, nausea and vomiting since her discharge from the hospital. Being managed for abdominal pain in the setting of pancreatic pseudocyst, s/p percutaneous cyst drainage.    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  S/p EGD with EUS assisted drainage 7/9.  Drained 800 mL cyst.  Recommendations from GI to continue antibiotics for 3 days.  Transitioned to Cipro  p.o.  Will repeat imaging in 1-2 days.  Advance diet as tolerated IV fluid, antiemetics on board.  Will attempt to wean back IV Dilaudid , add p.o. opioids.  Add scheduled Toradol .   Concern for sepsis - SIRS with lactic acidosis, tachycardia and leukocytosis.  Leukocytosis resolved.  Sepsis ruled out.   Diabetes mellitus - A1c 7.3.  Insulin  sliding scale on board.   HTN - As needed antihypertensives.   Tobacco use disorder - Encourage cessation.  Nicotine  patch as needed.   Hypokalemia/hypomagnesemia - Replenishment ongoing.  Recheck BMP and magnesium  in AM.   Subjective: Patient feeling improved this morning.  Abdominal pain is improved.  Tolerating full liquid diet.  Denies any fever, chills, chest pain, nausea, vomiting.  Physical Exam:  Vitals:   05/25/24 2113 05/26/24 0636 05/26/24 0727 05/26/24 0855  BP: (!) 151/87 (!) 150/89 (!) 159/110 (!) 142/90  Pulse: 68 82 89    Resp: 18 18 18    Temp: 98 F (36.7 C) 98 F (36.7 C) 98.5 F (36.9 C)   TempSrc: Oral  Oral   SpO2: 100% 96% 97%   Weight:      Height:        GENERAL:  Alert, pleasant, no acute distress, obese HEENT:  EOMI CARDIOVASCULAR:  RRR, no murmurs appreciated RESPIRATORY:  Clear to auscultation, no wheezing, rales, or rhonchi GASTROINTESTINAL:  Soft, minimally tender, nondistended EXTREMITIES:  No LE edema bilaterally NEURO:  No new focal deficits appreciated SKIN:  No rashes noted PSYCH:  Appropriate mood and affect    Data Reviewed:  Imaging Studies: CT ABDOMEN PELVIS W CONTRAST Result Date: 05/21/2024 CLINICAL DATA:  38 year old female with history of pancreatic pseudocyst status post image guided aspiration on 05/18/2024. EXAM: CT ABDOMEN AND PELVIS WITH CONTRAST TECHNIQUE: Multidetector CT imaging of the abdomen and pelvis was performed using the standard protocol following bolus administration of intravenous contrast. RADIATION DOSE REDUCTION: This exam was performed according to the departmental dose-optimization program which includes automated exposure control, adjustment of the mA and/or kV according to patient size and/or use of iterative reconstruction technique. CONTRAST:  75mL OMNIPAQUE  IOHEXOL  350 MG/ML SOLN COMPARISON:  05/17/2024 FINDINGS: Lower chest: Similar appearing small left pleural effusion with associated left basilar passive atelectasis. Hepatobiliary: No focal liver abnormality is seen. Status post cholecystectomy. No biliary dilatation. Pancreas: Similar normal appearing head and body of the pancreas with large multiloculated cystic lesion extending from the pancreatic tail superiorly to the greater curvature of the stomach and perisplenic regions. The collection is overall similar to most recent comparison CT. Spleen: Splenic parenchyma  is normal in size enhancement. Again seen is a large subcapsular appearing splenic cystic lesion with peripheral enhancement arising  from the superior aspect which measures up to approximately 13.6 x 8.0 cm in maximum axial dimensions. Adrenals/Urinary Tract: Adrenal glands are unremarkable. Kidneys are normal, without renal calculi, focal lesion, or hydronephrosis. Bladder is unremarkable. Stomach/Bowel: Stomach is within normal limits, nearly collapsed extrinsic compression from neighboring pancreatic cysts. Appendix appears normal. No evidence of bowel wall thickening, distention, or inflammatory changes. Vascular/Lymphatic: No significant vascular findings are present. No enlarged abdominal or pelvic lymph nodes. Reproductive: Intrauterine device in place in unchanged position. Uterus and bilateral adnexa are otherwise unremarkable. Other: Interval development of crescentic small volume ascites in the left upper quadrant. Musculoskeletal: No acute osseous abnormality. Similar appearing advanced discogenic degenerative change at L3-L4 with unchanged anterolisthesis at this level. IMPRESSION: 1. Similar appearing large multiloculated cystic lesion extending from the pancreatic tail superiorly to the greater curvature of the stomach and perisplenic regions. The collection is overall similar to most recent comparison CT. 2. Interval development of crescentic small volume ascites in the left upper quadrant. Suspect pancreatic pseudocyst contents after recent instrumentation. 3. Similar appearing small left pleural effusion with associated left basilar passive atelectasis. Ester Sides, MD [facility] Electronically Signed   By: Ester Sides M.D.   On: 05/21/2024 21:23   DG Abd 2 Views Result Date: 05/18/2024 CLINICAL DATA:  [id number] Acute generalized abdominal pain [id number] EXAM: ABDOMEN - 2 VIEW COMPARISON:  May 17, 2024, April 19, 2024 FINDINGS: The study is degraded by patient's body habitus. Nonobstructive bowel gas pattern. No pneumoperitoneum. No organomegaly or radiopaque calculi. No  acute fracture or destructive lesion. Elevation of the left hemidiaphragm again partially visualized. The right lung bases otherwise clear.Cholecystectomy clips and intrauterine device overlying the pelvis. Multilevel degenerative disc disease of the spine. IMPRESSION: Nonobstructive bowel gas pattern. Electronically Signed   By: Rogelia Myers M.D.   On: 05/18/2024 17:48   CT GUIDED NEEDLE PLACEMENT Result Date: 05/18/2024 INDICATION: [id number] Pancreatic pseudocyst [id number]. Diagnostic aspiration requested. EXAM: CT-GUIDED PERIPANCREATIC FLUID COLLECTION ASPIRATION COMPARISON:  CT CAP, 05/17/2024.  MR ABDOMEN, 05/17/2024. MEDICATIONS: The patient is currently admitted to the hospital and receiving intravenous antibiotics. The antibiotics were administered within an appropriate time frame prior to the initiation of the procedure. ANESTHESIA/SEDATION: Local anesthetic was administered. CONTRAST:  None FLUOROSCOPY TIME:  CT dose; 977 mGycm COMPLICATIONS: None immediate. PROCEDURE: RADIATION DOSE REDUCTION: This exam was performed according to the departmental dose-optimization program which includes automated exposure control, adjustment of the mA and/or kV according to patient size and/or use of iterative reconstruction technique. Informed written consent was obtained from the patient after a discussion of the risks, benefits and alternatives to treatment. The patient was placed supine on the CT gantry and a pre procedural CT was performed re-demonstrating the known fluid collection within the LEFT upper quadrant. The procedure was planned. A timeout was performed prior to the initiation of the procedure. The LEFT upper quadrant was prepped and draped in the usual sterile fashion. The overlying soft tissues were anesthetized with 1% lidocaine  with epinephrine . Appropriate trajectory was planned with the use of a 22 gauge spinal needle. An 18 gauge trocar needle was advanced into the collection and appropriate  positioning was confirmed with a limited CT scan. 100 mL of tan brown fluid was aspirated. The needle was removed then a dressing was placed. The patient tolerated the procedure well without immediate post procedural complication.  IMPRESSION: Successful CT-guided diagnostic aspiration of a peripancreatic fluid collection yielding 100 mL of 10 brown fluid. Samples were sent to the laboratory as requested by the ordering clinical team. Thom Hall, MD [facility] Electronically Signed   By: Thom Hall M.D.   On: 05/18/2024 17:14   MR ABDOMEN MRCP W WO CONTAST Result Date: 05/18/2024 CLINICAL DATA:  ACUTE PANCREATITIS. EXAM: MRI ABDOMEN WITHOUT AND WITH CONTRAST (INCLUDING MRCP) TECHNIQUE: Multiplanar multisequence MR imaging of the abdomen was performed both before and after the administration of intravenous contrast. Heavily T2-weighted images of the biliary and pancreatic ducts were obtained, and three-dimensional MRCP images were rendered by post processing. CONTRAST:  10mL GADAVIST  GADOBUTROL  1 MMOL/ML IV SOLN COMPARISON:  CT SCAN 05/17/2024 FINDINGS: Lower chest: Left base atelectasis with small left pleural effusion. Hepatobiliary: No focal abnormality identified in the liver. No substantial intrahepatic biliary duct dilatation. Gallbladder surgically absent. Common duct measures 11 mm in the hepatoduodenal ligament. Common bile duct measures 6 mm diameter just proximal to the ampulla. Pancreas: Relatively unremarkable appearance of the pancreatic head. Pancreatic parenchyma appears to enhance throughout with no definite MR features of pancreatic necrosis. There is edema in the body and tail of pancreas. No main duct dilatation. As seen on the recent CT there is a large complex cystic process in the left upper quadrant involving the wall of the proximal stomach, pancreatic tail, subdiaphragmatic space, and dome of the spleen. Spleen: Irregularity of  the splenic capsule in the posterior dome of the spleen suggest disruption by the complex cystic process. Adrenals/Urinary Tract: No adrenal nodule or mass. Kidneys unremarkable. Stomach/Bowel: Complex fluid dissects in the wall of the proximal stomach. Duodenum unremarkable. No small bowel or colonic dilatation within the visualized abdomen. Vascular/Lymphatic: Portal vein and superior mesenteric vein are patent. 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 Celiac axis and SMA are patent. Splenic artery appears to be patent. Upper normal lymph nodes in the hepatoduodenal ligament. Other: Irregular somewhat tubular rimr enhancing fluid collection in left paracolic gutter measures on the order of 10.4 x 2.5 x 4.1 cm Musculoskeletal: No focal suspicious marrow enhancement within the visualized bony anatomy. Advanced degenerative changes are seen at the level of the L3-4 disc space. IMPRESSION: 1. Large complex cystic process in the left upper quadrant involving the wall of the proximal stomach, pancreatic tail, subdiaphragmatic space, and dome of the spleen. Imaging features are compatible with sequelae of pancreatitis with pseudocyst formation. Dominant collection in the subdiaphragmatic space measures on the order of 12.5 x 12.7 x 7.9 cm. Superinfection not excluded by imaging. No definite MR features of pancreatic necrosis. 2. Status post cholecystectomy with mild dilatation of the common duct in the hepatoduodenal ligament, likely related to postcholecystectomy state. Common bile duct nondilated just proximal to the ampulla. No choledocholithiasis. 3. Irregular somewhat tubular rim enhancing fluid collection in the left paracolic gutter measures on the order of 10.4 x 2.5 x 4.1 cm. Imaging features are compatible with pseudocyst. Superinfection not excluded by imaging. 4. 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 5. Left base atelectasis with small left pleural effusion. Electronically Signed    By: Camellia Candle M.D.   On: 05/18/2024 05:32   MR 3D Recon At Scanner Result Date: 05/18/2024 CLINICAL DATA:  ACUTE PANCREATITIS. EXAM: MRI ABDOMEN WITHOUT AND WITH CONTRAST (INCLUDING MRCP) TECHNIQUE: Multiplanar multisequence MR imaging of the abdomen was performed both before and after the administration of intravenous contrast. Heavily T2-weighted  images of the biliary and pancreatic ducts were obtained, and three-dimensional MRCP images were rendered by post processing. CONTRAST:  10mL GADAVIST  GADOBUTROL  1 MMOL/ML IV SOLN COMPARISON:  CT SCAN 05/17/2024 FINDINGS: Lower chest: Left base atelectasis with small left pleural effusion. Hepatobiliary: No focal abnormality identified in the liver. No substantial intrahepatic biliary duct dilatation. Gallbladder surgically absent. Common duct measures 11 mm in the hepatoduodenal ligament. Common bile duct measures 6 mm diameter just proximal to the ampulla. Pancreas: Relatively unremarkable appearance of the pancreatic head. Pancreatic parenchyma appears to enhance throughout with no definite MR features of pancreatic necrosis. There is edema in the body and tail of pancreas. No main duct dilatation. As seen on the recent CT there is a large complex cystic process in the left upper quadrant involving the wall of the proximal stomach, pancreatic tail, subdiaphragmatic space, and dome of the spleen. Spleen: Irregularity of the splenic capsule in the posterior dome of the spleen suggest disruption by the complex cystic process. Adrenals/Urinary Tract: No adrenal nodule or mass. Kidneys unremarkable. Stomach/Bowel: Complex fluid dissects in the wall of the proximal stomach. Duodenum unremarkable. No small bowel or colonic dilatation within the visualized abdomen. Vascular/Lymphatic: Portal vein and superior mesenteric vein are patent. 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 Celiac axis and SMA are patent. Splenic artery appears to be patent. Upper  normal lymph nodes in the hepatoduodenal ligament. Other: Irregular somewhat tubular rimr enhancing fluid collection in left paracolic gutter measures on the order of 10.4 x 2.5 x 4.1 cm Musculoskeletal: No focal suspicious marrow enhancement within the visualized bony anatomy. Advanced degenerative changes are seen at the level of the L3-4 disc space. IMPRESSION: 1. Large complex cystic process in the left upper quadrant involving the wall of the proximal stomach, pancreatic tail, subdiaphragmatic space, and dome of the spleen. Imaging features are compatible with sequelae of pancreatitis with pseudocyst formation. Dominant collection in the subdiaphragmatic space measures on the order of 12.5 x 12.7 x 7.9 cm. Superinfection not excluded by imaging. No definite MR features of pancreatic necrosis. 2. Status post cholecystectomy with mild dilatation of the common duct in the hepatoduodenal ligament, likely related to postcholecystectomy state. Common bile duct nondilated just proximal to the ampulla. No choledocholithiasis. 3. Irregular somewhat tubular rim enhancing fluid collection in the left paracolic gutter measures on the order of 10.4 x 2.5 x 4.1 cm. Imaging features are compatible with pseudocyst. Superinfection not excluded by imaging. 4. 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 5. Left base atelectasis with small left pleural effusion. Electronically Signed   By: Camellia Candle M.D.   On: 05/18/2024 05:32   CT CHEST ABDOMEN PELVIS W CONTRAST Result Date: 05/17/2024 CLINICAL DATA:  Sepsis EXAM: CT CHEST, ABDOMEN, AND PELVIS WITH CONTRAST TECHNIQUE: Multidetector CT imaging of the chest, abdomen and pelvis was performed following the standard protocol during bolus administration of intravenous contrast. RADIATION DOSE REDUCTION: This exam was performed according to the departmental dose-optimization program which includes automated exposure control, adjustment of the mA and/or kV according to  patient size and/or use of iterative reconstruction technique. CONTRAST:  65mL OMNIPAQUE  IOHEXOL  350 MG/ML SOLN COMPARISON:  CT abdomen pelvis 04/25/2024 FINDINGS: CT CHEST FINDINGS Cardiovascular: Normal heart size. No significant pericardial effusion. The thoracic aorta is normal in caliber. At least single vessel atherosclerotic plaque of the thoracic aorta. Mild coronary artery calcifications. Mediastinum/Nodes: No enlarged mediastinal, hilar, or axillary lymph nodes. Thyroid gland, trachea, and esophagus demonstrate no significant findings. Lungs/Pleura:  Left lower lobe atelectasis. No pulmonary nodule. No pulmonary mass. Slight interval increase of trace left pleural effusion. No pneumothorax. Musculoskeletal: No chest wall abnormality. No suspicious lytic or blastic osseous lesions. No acute displaced fracture. Multilevel mild degenerative changes of the spine. Partially visualized cervicothoracic spine demonstrates surgical hardware. CT ABDOMEN PELVIS FINDINGS Hepatobiliary: No focal liver abnormality. Status post cholecystectomy. No biliary dilatation. Pancreas: Interval increase in size of a large lobulated and multiloculated left upper quadrant cystic lesion arising from the pancreatic tail that again appears to be inseparable from the greater curvature of the stomach and the spleen. Finding demonstrates thick enhancing walls. The largest component measures 15 x 10.5 cm (from 10 x 6 cm). The cystic lesion abuts to the left hemidiaphragm and pushes the hemidiaphragm up. Slightly hazy pancreatic tail contour with associated peripancreatic fat stranding. No main pancreatic ductal dilatation. Spleen: Interval concave scalloping of the splenic capsule with suggestion of invasion of the left upper quadrant cystic lesion. Interval development of adjacent punctate hypodensity (5:50). Spleen is normal in size. Adrenals/Urinary Tract: No adrenal nodule bilaterally. Bilateral kidneys enhance symmetrically. No  hydronephrosis. No hydroureter. The urinary bladder is unremarkable. Stomach/Bowel: Stomach is within normal limits. No evidence of bowel wall thickening or dilatation. Vague fat stranding along the splenic flexure likely reactive changes with left upper quadrant abscess adjacent but not be inseparable (5:92). Appendix appears normal. Vascular/Lymphatic: No abdominal aorta or iliac aneurysm. Mild atherosclerotic plaque of the aorta and its branches. No abdominal, pelvic, or inguinal lymphadenopathy. Reproductive: T-shaped intrauterine device in appropriate position. Uterus and bilateral adnexa are unremarkable. Other: No intraperitoneal free fluid. No intraperitoneal free gas. No organized fluid collection. Musculoskeletal: No abdominal wall hernia or abnormality. No suspicious lytic or blastic osseous lesions. No acute displaced fracture. Multilevel degenerative changes of the spine. Stable grade 2 anterolisthesis of L3 on L4 with endplate sclerosis and intervertebral disc space vacuum phenomenon. IMPRESSION: 1. Persistent acute pancreatitis with interval increase in size of a (up to 15 x 10.5 cm on axial imaging) pancreatic tail abscess that extends along the left upper quadrant is inseparable from the greater curvature of the stomach, spleen, inferior aspect of the left hemidiaphragm. Findings suggestive of invasion into the splenic capsule with possible adjacent second abscess forming within the splenic parenchyma-too small to characterize, recommend attention on follow-up. When the patient is clinically stable and able to follow directions and hold their breath (preferably as an outpatient) further evaluation with dedicated abdominal MRI pancreatic protocol should be considered. 2. Slight interval increase of trace left pleural effusion. 3. Persistent elevated left hemidiaphragm with left base atelectasis. 4.  Aortic Atherosclerosis (ICD10-I70.0). 5. shaped intrauterine device in appropriate position. 6. Stable  grade 2 anterolisthesis of L3 on L4 with associated severe degenerative changes. Electronically Signed   By: Morgane  Naveau M.D.   On: 05/17/2024 17:16    There are no new results to review at this time.  Previous records (including but not limited to H&P, progress notes, nursing notes, TOC management) were reviewed in assessment of this patient.  Labs: CBC: Recent Labs  Lab 05/21/24 0358 05/21/24 1610 05/22/24 0406 05/22/24 1700 05/23/24 0535 05/23/24 1637 05/24/24 0434 05/25/24 0547 05/26/24 0536  WBC 10.0  --  9.3  --  11.8*  --   --  11.6* 15.2*  NEUTROABS 6.2  --   --   --  7.9*  --   --   --   --   HGB 9.7*   < > 9.7*   < >  10.3* 10.6* 10.3* 10.3* 10.3*  HCT 31.2*   < > 31.5*   < > 32.7* 33.8* 32.6* 32.7* 33.2*  MCV 80.6  --  80.6  --  80.1  --   --  80.1 81.2  PLT 438*  --  473*  --  482*  --   --  530* 505*   < > = values in this interval not displayed.   Basic Metabolic Panel: Recent Labs  Lab 05/20/24 0402 05/21/24 0358 05/23/24 0535 05/25/24 0547 05/26/24 0536  NA 134* 136 137 136 139  K 3.8 3.5 3.5 4.0 3.6  CL 105 103 105 101 104  CO2 20* 24 23 24 24   GLUCOSE 101* 86 117* 117* 109*  BUN 14 10 6 6 11   CREATININE 0.69 0.86 0.61 0.73 0.72  CALCIUM 8.1* 8.4* 8.7* 9.1 8.6*  MG 1.7  --   --  1.7 1.8   Liver Function Tests: Recent Labs  Lab 05/20/24 0402 05/26/24 0536  AST 9* 12*  ALT 5 10  ALKPHOS 56 57  BILITOT 0.2 0.3  PROT 5.4* 5.7*  ALBUMIN 1.9* 2.4*   CBG: Recent Labs  Lab 05/25/24 1131 05/25/24 1648 05/25/24 2114 05/26/24 0728 05/26/24 1136  GLUCAP 98 133* 126* 92 81    Scheduled Meds:  ciprofloxacin   500 mg Oral BID   feeding supplement  237 mL Oral BID BM   insulin  aspart  0-9 Units Subcutaneous TID WC   ketorolac   15 mg Intravenous Q8H   multivitamin with minerals  1 tablet Oral Daily   nicotine   14 mg Transdermal Daily   pantoprazole   40 mg Oral Q1200   sodium chloride  flush  3 mL Intravenous Q12H   thiamine   100 mg Oral  Daily   Continuous Infusions: PRN Meds:.acetaminophen , albuterol , diphenhydrAMINE , HYDROcodone -acetaminophen , HYDROmorphone  (DILAUDID ) injection, labetalol , melatonin, nicotine  polacrilex, ondansetron  (ZOFRAN ) IV, polyethylene glycol  Family Communication: None at bedside  Disposition: Status is: Inpatient Remains inpatient appropriate because: Pancreatitis     Time spent: 33 minutes  Length of inpatient stay: 9 days  Author: Carliss LELON Canales, DO 05/26/2024 1:26 PM  For on call review [email or web address].

## 2024-05-27 DIAGNOSIS — Z716 Tobacco abuse counseling: Secondary | ICD-10-CM | POA: Diagnosis not present

## 2024-05-27 DIAGNOSIS — K863 Pseudocyst of pancreas: Secondary | ICD-10-CM | POA: Diagnosis not present

## 2024-05-27 DIAGNOSIS — R110 Nausea: Secondary | ICD-10-CM | POA: Diagnosis not present

## 2024-05-27 DIAGNOSIS — R630 Anorexia: Secondary | ICD-10-CM | POA: Diagnosis not present

## 2024-05-27 DIAGNOSIS — K859 Acute pancreatitis without necrosis or infection, unspecified: Secondary | ICD-10-CM | POA: Diagnosis not present

## 2024-05-27 DIAGNOSIS — R1012 Left upper quadrant pain: Secondary | ICD-10-CM | POA: Diagnosis not present

## 2024-05-27 LAB — GLUCOSE, CAPILLARY
Glucose-Capillary: 105 mg/dL — ABNORMAL HIGH (ref 70–99)
Glucose-Capillary: 113 mg/dL — ABNORMAL HIGH (ref 70–99)
Glucose-Capillary: 125 mg/dL — ABNORMAL HIGH (ref 70–99)
Glucose-Capillary: 108 mg/dL — ABNORMAL HIGH (ref 70–99)

## 2024-05-27 LAB — CBC
HCT: 34.9 % — ABNORMAL LOW (ref 36.0–46.0)
Hemoglobin: 10.9 g/dL — ABNORMAL LOW (ref 12.0–15.0)
MCH: 25 pg — ABNORMAL LOW (ref 26.0–34.0)
MCHC: 31.2 g/dL (ref 30.0–36.0)
MCV: 80 fL (ref 80.0–100.0)
Platelets: 493 K/uL — ABNORMAL HIGH (ref 150–400)
RBC: 4.36 MIL/uL (ref 3.87–5.11)
RDW: 14.6 % (ref 11.5–15.5)
WBC: 12.1 K/uL — ABNORMAL HIGH (ref 4.0–10.5)
nRBC: 0 % (ref 0.0–0.2)

## 2024-05-27 NOTE — Progress Notes (Signed)
  Progress Note   Patient: Katrina Lambert FMW:[id number] DOB: 1986/07/07 DOA: 05/17/2024     10 DOS: the patient was seen and examined on 05/27/2024   Brief hospital course: 38 year old F with PMH of DM-2, HTN, asthma, IDA, morbid obesity, B12 deficiency, cervical myelopathy, tobacco use disorder and recent hospitalization at [facility] from 5/3-5/6 for idiopathic pancreatitis and at [facility] 5/31/611 for recurrent pancreatitis with pseudocyst as well as hospital-acquired pneumonia complicated by parapneumonic effusion requiring chest tube placement returning to ED with worsening intermittent abdominal pain, nausea and vomiting since her discharge from the hospital. Being managed for abdominal pain in the setting of pancreatic pseudocyst, s/p percutaneous cyst drainage.    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  S/p EGD with EUS assisted drainage 7/9.  Drained 800 mL cyst.   - Appreciate assistance and recommendation by GI service - Continue as needed analgesics - If any worsening symptoms after diet advanced we will downgrade her back to clear liquid diet and repeat CT abd - Maintain adequate hydration-continue as needed antiemetics.   Concern for sepsis - SIRS with lactic acidosis, tachycardia and leukocytosis.  Leukocytosis resolved.   -Sepsis ruled out. -Continue to maintain adequate hydration, continue supportive care and Response.   Diabetes mellitus - A1c 7.3. - Continue sliding scale insulin  and follow CBG fluctuation.   HTN - Continue as needed antihypertensive agents.   Tobacco use disorder - Cessation counseling provided - Continue nicotine  patch.   Hypokalemia/hypomagnesemia - Repleted electrolytes trend and further replete as needed GI losses and decreased oral intake playing a role.  Class II obesity -.Body mass index is 39.62 kg/m.  -Low-calorie diet, portion control and increased physical activity  discussed with patient.  Subjective:  Afebrile; complaining of nausea and worsening abdominal pain after diet advanced.  Good saturation on room air.  No fever.  Physical Exam: Vitals:   05/26/24 2033 05/27/24 0458 05/27/24 0719 05/27/24 1635  BP: (!) 154/92 (!) 139/91 (!) 154/89 (!) 142/98  Pulse: 95 96 96 (!) 106  Resp:  17 18 18   Temp: 98.3 F (36.8 C) 98 F (36.7 C) 98 F (36.7 C) 98.7 F (37.1 C)  TempSrc: Oral Oral Oral Oral  SpO2: 96% 97% 92% 97%  Weight:      Height:       General exam: Alert, awake, oriented x 3; in mild distress secondary to abdominal pain and ongoing/recurrent  nausea Respiratory system: Good saturation on room air. Cardiovascular system: Rate controlled, no rubs, no gallops, no JVD on exam. Gastrointestinal system: Abdomen is nondistended, soft and without guarding; diffusely tender to palpation (mainly mid abdomen).  Positive bowel sounds. Central nervous system:  No focal neurological deficits. Extremities: No cyanosis or clubbing. Skin: No petechiae. Psychiatry: Judgement and insight appear normal. Mood & affect appropriate.    Data Reviewed: CBC: WBCs 12.1, hemoglobin 10.9, platelet count 493K.  Family Communication: No family at bedside.  Disposition: Status is: Inpatient Remains inpatient appropriate because: continue IV therapy.  Time spent: 35 minutes  Author: Eric Nunnery, MD 05/27/2024 5:23 PM  For on call review [email or web address].

## 2024-05-27 NOTE — Plan of Care (Signed)
  Problem: Health Behavior/Discharge Planning: Goal: Ability to manage health-related needs will improve Outcome: Progressing   Problem: Clinical Measurements: Goal: Ability to maintain clinical measurements within normal limits will improve Outcome: Progressing Goal: Will remain free from infection Outcome: Progressing Goal: Diagnostic test results will improve Outcome: Progressing Goal: Respiratory complications will improve Outcome: Progressing Goal: Cardiovascular complication will be avoided Outcome: Progressing   Problem: Nutrition: Goal: Adequate nutrition will be maintained Outcome: Progressing   Problem: Coping: Goal: Level of anxiety will decrease Outcome: Progressing   Problem: Elimination: Goal: Will not experience complications related to bowel motility Outcome: Progressing Goal: Will not experience complications related to urinary retention Outcome: Progressing   Problem: Pain Managment: Goal: General experience of comfort will improve and/or be controlled Outcome: Progressing   Problem: Safety: Goal: Ability to remain free from injury will improve Outcome: Progressing   Problem: Skin Integrity: Goal: Risk for impaired skin integrity will decrease Outcome: Progressing   Problem: Education: Goal: Ability to describe self-care measures that may prevent or decrease complications (Diabetes Survival Skills Education) will improve Outcome: Progressing Goal: Individualized Educational Video(s) Outcome: Progressing   Problem: Coping: Goal: Ability to adjust to condition or change in health will improve Outcome: Progressing   Problem: Fluid Volume: Goal: Ability to maintain a balanced intake and output will improve Outcome: Progressing   Problem: Health Behavior/Discharge Planning: Goal: Ability to identify and utilize available resources and services will improve Outcome: Progressing Goal: Ability to manage health-related needs will improve Outcome:  Progressing   Problem: Metabolic: Goal: Ability to maintain appropriate glucose levels will improve Outcome: Progressing   Problem: Nutritional: Goal: Maintenance of adequate nutrition will improve Outcome: Progressing Goal: Progress toward achieving an optimal weight will improve Outcome: Progressing   Problem: Skin Integrity: Goal: Risk for impaired skin integrity will decrease Outcome: Progressing   Problem: Tissue Perfusion: Goal: Adequacy of tissue perfusion will improve Outcome: Progressing

## 2024-05-27 NOTE — Progress Notes (Addendum)
 Daily Progress Note  DOA: 05/17/2024 Hospital Day: 59   Cc:  Upper abdominal pain and pancreatic pseudocysts   Brief History:  38 y.o. year old female with a medical history including but not limited to   ASSESSMENT    38 yo female with Idiopathic pancreatitis occurred at outside hospital in May of this year   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 S/p EUS with cyst drainage ( 800 ml) on 05/24/24.   Cytology negative  >>TODAY: IV Dilaudid  dose has been reduced and she is now getting Toradol . Has worsening LUQ abdominal pain today. Started low fat diet yesterday and was okay at dinner but then worsening pain and nausea after breakfast. She has put herself back on clear liquids.  Afebrile.  WBC was up yesterday from 11.6-15.2 no labs drawn today   Gastritis, s/p biopsy Gastric biopsies unremarkable STOMACH, BIOPSY:  - Gastric antral and oxyntic mucosa with no specific histopathologic  changes  - Helicobacter pylori-like organisms are not identified on routine HE  stain    Principal Problem:   Pancreatic abscess Active Problems:   Acute pancreatitis   Pleural effusion on left   Left upper quadrant pain   Nausea in adult   Encounter for smoking cessation counseling   Acute pancreatic fluid collection   Malnutrition of moderate degree   Pancreatic pseudocyst   Gastritis without bleeding   PLAN   --CBC --Back to [facility] --Given the persistent and progressive abdominal pain will proceed with follow up CT AP with contrast tomorrow   Subjective   Has worsening LUQ abdominal pain today. Started low fat diet yesterday and was okay after dinner but then worsening pain / nausea after breakfast this am. She has put herself back on clear liquids.   WBC was up yesterday from 11.6-15.2 no labs drawn today  Had BM last night  Objective   GI Studies:    Recent Labs     05/25/24 0547 05/26/24 0536  WBC 11.6* 15.2*  HGB 10.3* 10.3*  HCT 32.7* 33.2*  MCV 80.1 81.2  PLT 530* 505*   No results for input(s): FOLATE, VITAMINB12, FERRITIN, TIBC, IRONPCTSAT in the last 72 hours. Recent Labs    05/25/24 0547 05/26/24 0536  NA 136 139  K 4.0 3.6  CL 101 104  CO2 24 24  GLUCOSE 117* 109*  BUN 6 11  CREATININE 0.73 0.72  CALCIUM 9.1 8.6*   Recent Labs    05/26/24 0536  PROT 5.7*  ALBUMIN 2.4*  AST 12*  ALT 10  ALKPHOS 57  BILITOT 0.3    Imaging:  CT ABDOMEN PELVIS W CONTRAST CLINICAL DATA:  38 year old female with history of pancreatic pseudocyst status post image guided aspiration on 05/18/2024.  EXAM: CT ABDOMEN AND PELVIS WITH CONTRAST  TECHNIQUE: Multidetector CT imaging of the abdomen and pelvis was performed using the standard protocol following bolus administration of intravenous contrast.  RADIATION DOSE REDUCTION: This exam was performed according to the departmental dose-optimization program which includes automated exposure control, adjustment of the mA and/or kV according to patient size and/or use of iterative reconstruction technique.  CONTRAST:  75mL OMNIPAQUE  IOHEXOL  350 MG/ML SOLN  COMPARISON:  05/17/2024  FINDINGS: Lower chest: Similar appearing small left pleural effusion with associated left basilar passive atelectasis.  Hepatobiliary: No focal liver abnormality is seen. Status post cholecystectomy. No biliary dilatation.  Pancreas: Similar normal appearing head and body of the  pancreas with large multiloculated cystic lesion extending from the pancreatic tail superiorly to the greater curvature of the stomach and perisplenic regions. The collection is overall similar to most recent comparison CT.  Spleen: Splenic parenchyma is normal in size enhancement. Again seen is a large subcapsular appearing splenic cystic lesion with peripheral enhancement arising from the superior aspect  which measures up to approximately 13.6 x 8.0 cm in maximum axial dimensions.  Adrenals/Urinary Tract: Adrenal glands are unremarkable. Kidneys are normal, without renal calculi, focal lesion, or hydronephrosis. Bladder is unremarkable.  Stomach/Bowel: Stomach is within normal limits, nearly collapsed extrinsic compression from neighboring pancreatic cysts. Appendix appears normal. No evidence of bowel wall thickening, distention, or inflammatory changes.  Vascular/Lymphatic: No significant vascular findings are present. No enlarged abdominal or pelvic lymph nodes.  Reproductive: Intrauterine device in place in unchanged position. Uterus and bilateral adnexa are otherwise unremarkable.  Other: Interval development of crescentic small volume ascites in the left upper quadrant.  Musculoskeletal: No acute osseous abnormality. Similar appearing advanced discogenic degenerative change at L3-L4 with unchanged anterolisthesis at this level.  IMPRESSION: 1. Similar appearing large multiloculated cystic lesion extending from the pancreatic tail superiorly to the greater curvature of the stomach and perisplenic regions. The collection is overall similar to most recent comparison CT. 2. Interval development of crescentic small volume ascites in the left upper quadrant. Suspect pancreatic pseudocyst contents after recent instrumentation. 3. Similar appearing small left pleural effusion with associated left basilar passive atelectasis.  Ester Sides, MD  [facility]  [facility]  Electronically Signed   By: Ester Sides M.D.   On: 05/21/2024 21:23     Scheduled inpatient medications:   feeding supplement  237 mL Oral BID BM   insulin  aspart  0-9 Units Subcutaneous TID WC   ketorolac   15 mg Intravenous Q8H   multivitamin with minerals  1 tablet Oral Daily   nicotine   14 mg Transdermal Daily   pantoprazole   40 mg Oral Q1200    sodium chloride  flush  3 mL Intravenous Q12H   thiamine   100 mg Oral Daily   Continuous inpatient infusions:  PRN inpatient medications: acetaminophen , albuterol , diphenhydrAMINE , HYDROcodone -acetaminophen , HYDROmorphone  (DILAUDID ) injection, labetalol , melatonin, nicotine  polacrilex, ondansetron  (ZOFRAN ) IV, polyethylene glycol  Vital signs in last 24 hours: Temp:  [98 F (36.7 C)-98.6 F (37 C)] 98 F (36.7 C) (07/12 0719) Pulse Rate:  [95-101] 96 (07/12 0719) Resp:  [17-18] 18 (07/12 0719) BP: (131-154)/(84-92) 154/89 (07/12 0719) SpO2:  [92 %-97 %] 92 % (07/12 0719) Last BM Date : 05/22/24  Intake/Output Summary (Last 24 hours) at 05/27/2024 1208 Last data filed at 05/27/2024 0836 Gross per 24 hour  Intake 3 ml  Output 0 ml  Net 3 ml    Intake/Output from previous day: No intake/output data recorded. Intake/Output this shift: Total I/O In: 3 [I.V.:3] Out: -    Physical Exam:  General: Alert female in NAD Heart:  Regular rate and rhythm.  Pulmonary: Normal respiratory effort Abdomen: Soft, nondistended, mild -moderate LUQ tenderness.  Normal bowel sounds. Extremities: No lower extremity edema  Neurologic: Alert and oriented Psych: Pleasant. Cooperative     LOS: 10 days   Vina Dasen ,NP 05/27/2024, 12:08 PM    I have taken an interval history, thoroughly reviewed the chart and examined the patient. I agree with the Advanced Practitioner's note, impression and recommendations, and have recorded additional findings, impressions and recommendations below. I performed a substantive portion of this encounter (>50% time  spent), including a complete performance of the medical decision making.  My additional thoughts are as follows:  Still having pain and difficulty advancing diet.  Fortunately WBC decreased from yesterday after the procedure on Wednesday.  Discussed with Dr. Wilhelmenia, and we have decided to get a CT abdomen and pelvis tomorrow to assess the  complex pancreatic pseudocyst and determine whether further intervention is needed in the short-term.  Also, she has had no meaningful nutrition throughout the hospitalization and risks of malnutrition.  If things do not turn around significantly in the next 48 hours, she needs a nasoduodenal feeding tube on Monday   Victory LITTIE Brand III [phone]

## 2024-05-28 ENCOUNTER — Inpatient Hospital Stay (HOSPITAL_COMMUNITY): Payer: MEDICAID

## 2024-05-28 DIAGNOSIS — R1012 Left upper quadrant pain: Secondary | ICD-10-CM | POA: Diagnosis not present

## 2024-05-28 DIAGNOSIS — K859 Acute pancreatitis without necrosis or infection, unspecified: Secondary | ICD-10-CM | POA: Diagnosis not present

## 2024-05-28 DIAGNOSIS — K863 Pseudocyst of pancreas: Secondary | ICD-10-CM | POA: Diagnosis not present

## 2024-05-28 DIAGNOSIS — R110 Nausea: Secondary | ICD-10-CM | POA: Diagnosis not present

## 2024-05-28 DIAGNOSIS — R630 Anorexia: Secondary | ICD-10-CM | POA: Diagnosis not present

## 2024-05-28 LAB — BPAM RBC
Blood Product Expiration Date: 202508112359
Blood Product Expiration Date: 202508112359
Unit Type and Rh: 6200
Unit Type and Rh: 6200

## 2024-05-28 LAB — GLUCOSE, CAPILLARY
Glucose-Capillary: 140 mg/dL — ABNORMAL HIGH (ref 70–99)
Glucose-Capillary: 124 mg/dL — ABNORMAL HIGH (ref 70–99)
Glucose-Capillary: 103 mg/dL — ABNORMAL HIGH (ref 70–99)
Glucose-Capillary: 105 mg/dL — ABNORMAL HIGH (ref 70–99)
Glucose-Capillary: 151 mg/dL — ABNORMAL HIGH (ref 70–99)

## 2024-05-28 LAB — TYPE AND SCREEN
ABO/RH(D): A POS
Antibody Screen: POSITIVE
DAT, IgG: POSITIVE
Unit division: 0
Unit division: 0

## 2024-05-28 MED ORDER — IOHEXOL 350 MG/ML SOLN
75.0000 mL | Freq: Once | INTRAVENOUS | Status: AC | PRN
  Administered 2024-05-28: 75 mL via INTRAVENOUS

## 2024-05-28 MED ORDER — KETOROLAC TROMETHAMINE 15 MG/ML IJ SOLN
15.0000 mg | Freq: Four times a day (QID) | INTRAMUSCULAR | Status: AC | PRN
  Administered 2024-05-28 – 2024-05-29 (×2): 15 mg via INTRAVENOUS
  Filled 2024-05-28 (×3): qty 1

## 2024-05-28 NOTE — Progress Notes (Addendum)
 Daily Progress Note  DOA: 05/17/2024 Hospital Day: 12   Cc:  Upper abdominal pain and pancreatic pseudocysts     ASSESSMENT    38 yo female with Idiopathic pancreatitis occurred at outside hospital in May of this year. Admitted with LUQ/back pain and worsening complex fluid collection in LUQ on imagine  S/p EUS with cyst drainage ( 800 ml) on 05/24/24.   Cytology negative  >>TODAY:  Had follow-up CT scan today .  Dr. Legrand will review the scan this afternoon but appears the large complex multiloculated fluid collection is unchanged (despite recent aspiration of 800 ml) and is occupying the majority of her LUQ . Still having LUQ / back pain, requiring frequent pain meds but looks a little more comfortable compared to yesterday. Additonally, nausea improved and she is tolerating solid food. Yesterday's WBC count improved from 15.2 ( day prior) to 12.     Hepatomegaly / hepatic steatosis   Gastritis, s/p biopsy Gastric biopsies unremarkable, no H.pylori    Principal Problem:   Pancreatic abscess Active Problems:   Acute pancreatitis   Pleural effusion on left   Left upper quadrant pain   Nausea in adult   Encounter for smoking cessation counseling   Acute pancreatic fluid collection   Malnutrition of moderate degree   Pancreatic pseudocyst   Gastritis without bleeding   PLAN   --Not much p.o. intake during this prolonged admission .  Low threshold to start nasoduodenal feedings on Monday but for now , surprisingly she is tolerating solid food  -- Continue MiraLAX  as needed.  At risk for constipation with narcotics /decreased mobility.  She did have a bowel movement a couple of days ago   Subjective   Feels slightly better compared to yesterday but still with significant LUQ/back pain.  Nausea much better today and she is tolerating solid food    Objective   GI Studies:    Recent Labs    05/26/24 0536 05/27/24 1438  WBC 15.2* 12.1*  HGB 10.3* 10.9*  HCT  33.2* 34.9*  MCV 81.2 80.0  PLT 505* 493*   No results for input(s): FOLATE, VITAMINB12, FERRITIN, TIBC, IRONPCTSAT in the last 72 hours. Recent Labs    05/26/24 0536  NA 139  K 3.6  CL 104  CO2 24  GLUCOSE 109*  BUN 11  CREATININE 0.72  CALCIUM 8.6*   Recent Labs    05/26/24 0536  PROT 5.7*  ALBUMIN 2.4*  AST 12*  ALT 10  ALKPHOS 57  BILITOT 0.3     Imaging:  CT ABDOMEN PELVIS W CONTRAST CLINICAL DATA:  Severe pancreatitis, pancreatic pseudocyst  EXAM: CT ABDOMEN AND PELVIS WITH CONTRAST  TECHNIQUE: Multidetector CT imaging of the abdomen and pelvis was performed using the standard protocol following bolus administration of intravenous contrast.  RADIATION DOSE REDUCTION: This exam was performed according to the departmental dose-optimization program which includes automated exposure control, adjustment of the mA and/or kV according to patient size and/or use of iterative reconstruction technique.  CONTRAST:  75mL OMNIPAQUE  IOHEXOL  350 MG/ML SOLN  COMPARISON:  05/21/2024  FINDINGS: Lower chest: Severe elevation of the left hemidiaphragm left upper quadrant incompletely imaged. Small left pleural effusion associated atelectasis or consolidation.  Hepatobiliary: No focal liver abnormality is seen. Hepatomegaly, maximum coronal span 23.4 cm. Hepatic steatosis. Status post cholecystectomy. No biliary dilatation.  Pancreas: Unchanged appearance of the pancreas, with a cystic lesion within the pancreatic tail measuring 2.2 x 1.6 cm (series 3, image  28). Similar inflammatory fat stranding and fluid in the left upper quadrant.  Spleen: Normal in size without significant abnormality.  Adrenals/Urinary Tract: Adrenal glands are unremarkable. Kidneys are normal, without renal calculi, solid lesion, or hydronephrosis. Bladder is unremarkable.  Stomach/Bowel: Stomach is within normal limits. Appendix appears normal. No evidence of bowel wall  thickening, distention, or inflammatory changes.  Vascular/Lymphatic: Aortic atherosclerosis. No enlarged abdominal or pelvic lymph nodes.  Reproductive: No mass or other significant abnormality. IUD present in the fundal endometrial cavity.  Other: No abdominal wall hernia or abnormality. Large, complex serpiginous multiloculated fluid collection occupying the majority of the left upper quadrant, about the spleen, gastric fundus, and tip of the pancreas, largest component immediately under the diaphragm measuring 13.4 x 8.4 cm, not significantly changed (series 6, image 6, series 6, image 63). Very slightly diminished volume of free and loculated in the ventral left upper quadrant (series 3, image 61).  Musculoskeletal: No acute or significant osseous findings.  IMPRESSION: 1. Large, complex serpiginous multiloculated fluid collection occupying the majority of the left upper quadrant, about the spleen, gastric fundus, and tip of the pancreas, largest component immediately under the diaphragm measuring 13.4 x 8.4 cm, not significantly changed. Very slightly diminished volume of free and loculated fluid in the ventral left upper quadrant. Findings remain consistent with a large pancreatic pseudocyst. 2. Unchanged appearance of the pancreas, with a pseudocyst within the pancreatic tail measuring 2.2 x 1.6 cm. Similar inflammatory fat stranding and fluid in the left upper quadrant. 3. Severe elevation of the left hemidiaphragm, left upper quadrant incompletely imaged. Small left pleural effusion associated atelectasis or consolidation. 4. Hepatomegaly and hepatic steatosis.  Aortic Atherosclerosis (ICD10-I70.0).  Electronically Signed   By: Marolyn JONETTA Jaksch M.D.   On: 05/28/2024 12:24     Scheduled inpatient medications:   feeding supplement  237 mL Oral BID BM   insulin  aspart  0-9 Units Subcutaneous TID WC   multivitamin with minerals  1 tablet Oral Daily   nicotine   14 mg  Transdermal Daily   pantoprazole   40 mg Oral Q1200   sodium chloride  flush  3 mL Intravenous Q12H   thiamine   100 mg Oral Daily   Continuous inpatient infusions:  PRN inpatient medications: acetaminophen , albuterol , diphenhydrAMINE , HYDROcodone -acetaminophen , HYDROmorphone  (DILAUDID ) injection, labetalol , melatonin, nicotine  polacrilex, ondansetron  (ZOFRAN ) IV, polyethylene glycol  Vital signs in last 24 hours: Temp:  [97.5 F (36.4 C)-98.7 F (37.1 C)] 97.5 F (36.4 C) (07/13 0737) Pulse Rate:  [94-106] 98 (07/13 0737) Resp:  [18-20] 18 (07/13 0737) BP: (129-148)/(85-98) 148/88 (07/13 0737) SpO2:  [93 %-97 %] 96 % (07/13 0737) Last BM Date : 05/28/24 (stated per pt at 0100)  Intake/Output Summary (Last 24 hours) at 05/28/2024 1345 Last data filed at 05/28/2024 1000 Gross per 24 hour  Intake 480 ml  Output 0 ml  Net 480 ml    Intake/Output from previous day: 07/12 0701 - 07/13 0700 In: 3 [I.V.:3] Out: 0  Intake/Output this shift: Total I/O In: 480 [P.O.:480] Out: -    Physical Exam:  General: Alert female in NAD Heart:  Regular rate and rhythm.  Pulmonary: Normal respiratory effort Abdomen: Soft, nondistended, mild-moderate LUQ tenderness, normal bowel sounds. Extremities: No lower extremity edema  Neurologic: Alert and oriented Psych: Pleasant. Cooperative     LOS: 11 days   Katrina Lambert ,NP 05/28/2024, 1:45 PM  Still symptomatic from a large complex left upper quadrant pancreatic pseudocyst that is essentially unchanged after recent EUS guided cyst aspiration.  No significant nutritional intake since admission (in probably for some time before hand) due to the pain and nausea.  This patient needs a nasoduodenal tube placement in the early part of this coming week, and it is time to start reaching out to academic institutions in the area that have daily availability of advanced pancreaticobiliary endoscopists and surgeons. Our advanced endoscopist (Dr.  Wilhelmenia) happens to have been on hospital service this past week to do the recent procedure, but will not be available in the coming weeks for consideration of endoscopic cyst gastrostomy.  Dr. Suzann will be taking over the inpatient consult service tomorrow.

## 2024-05-28 NOTE — Plan of Care (Signed)
  Problem: Clinical Measurements: Goal: Ability to maintain clinical measurements within normal limits will improve Outcome: Progressing Goal: Will remain free from infection Outcome: Progressing Goal: Diagnostic test results will improve Outcome: Progressing Goal: Respiratory complications will improve Outcome: Progressing Goal: Cardiovascular complication will be avoided Outcome: Progressing   Problem: Nutrition: Goal: Adequate nutrition will be maintained Outcome: Progressing   Problem: Coping: Goal: Level of anxiety will decrease Outcome: Progressing   Problem: Elimination: Goal: Will not experience complications related to bowel motility Outcome: Progressing Goal: Will not experience complications related to urinary retention Outcome: Progressing   Problem: Pain Managment: Goal: General experience of comfort will improve and/or be controlled Outcome: Progressing   Problem: Safety: Goal: Ability to remain free from injury will improve Outcome: Progressing   Problem: Skin Integrity: Goal: Risk for impaired skin integrity will decrease Outcome: Progressing   Problem: Coping: Goal: Ability to adjust to condition or change in health will improve Outcome: Progressing

## 2024-05-28 NOTE — Progress Notes (Signed)
 Progress Note    Katrina Lambert  FMW:[id number] DOB: 19-Jan-1986  DOA: 05/17/2024 PCP: [facility]      Brief Narrative:    Medical records reviewed and are as summarized below:  Katrina Lambert is a 38 y.o. female with PMH of DM-2, HTN, asthma, IDA, morbid obesity, B12 deficiency, cervical myelopathy, tobacco use disorder and recent hospitalization at [facility] from 5/3-5/6 for idiopathic pancreatitis and at [facility] 5/31/611 for recurrent pancreatitis with pseudocyst as well as hospital-acquired pneumonia complicated by parapneumonic effusion requiring chest tube placement returning to ED with worsening intermittent abdominal pain, nausea and vomiting since her discharge from the hospital. Being managed for abdominal pain in the setting of pancreatic pseudocyst, s/p percutaneous cyst drainage.       Assessment/Plan:   Principal Problem:   Pancreatic abscess Active Problems:   Acute pancreatitis   Pleural effusion on left   Left upper quadrant pain   Nausea in adult   Encounter for smoking cessation counseling   Acute pancreatic fluid collection   Malnutrition of moderate degree   Pancreatic pseudocyst   Gastritis without bleeding   Nutrition Problem: Moderate Malnutrition Etiology: acute illness  Signs/Symptoms: percent weight loss, mild muscle depletion Percent weight loss: 7.7 %   Body mass index is 39.62 kg/m.  (Class II obesity)   Acute on chronic pancreatitis with pancreatic pseudocyst - S/p CT-guided diagnostic aspiration 7/3.  S/p EGD with EUS assisted drainage 7/9.  Drained 800 mL cyst.   Continue analgesics as needed for pain. Follow-up with gastroenterology for further recommendations. Repeat CT abdomen and pelvis on 05/28/2024   IMPRESSION: 1. Large, complex serpiginous multiloculated fluid collection occupying the majority of the left upper quadrant, about the spleen, gastric fundus, and tip of the pancreas, largest component immediately under  the diaphragm measuring 13.4 x 8.4 cm, not significantly changed. Very slightly diminished volume of free and loculated fluid in the ventral left upper quadrant. Findings remain consistent with a large pancreatic pseudocyst. 2. Unchanged appearance of the pancreas, with a pseudocyst within the pancreatic tail measuring 2.2 x 1.6 cm. Similar inflammatory fat stranding and fluid in the left upper quadrant. 3. Severe elevation of the left hemidiaphragm, left upper quadrant incompletely imaged. Small left pleural effusion associated atelectasis or consolidation. 4. Hepatomegaly and hepatic steatosis.    SIRS with lactic acidosis, tachycardia and leukocytosis.  Leukocytosis resolved.   -Sepsis ruled out. Continue supportive care    Type II diabetes mellitus - A1c 7.3. NovoLog  as needed per sliding scale.      Hypokalemia/hypomagnesemia Improved.  Replete as needed   Comorbidities including hypertension, tobacco use disorder on nicotine  patch     Diet Order             Diet Heart Fluid consistency: Thin  Diet effective now                            Consultants: Gastroenterologist  Procedures: - S/p CT-guided diagnostic aspiration 7/3.   S/p EGD with EUS assisted drainage 7/9.  Drained 800 mL cyst.     Medications:    feeding supplement  237 mL Oral BID BM   insulin  aspart  0-9 Units Subcutaneous TID WC   multivitamin with minerals  1 tablet Oral Daily   nicotine   14 mg Transdermal Daily   pantoprazole   40 mg Oral Q1200   sodium chloride  flush  3 mL Intravenous Q12H  thiamine   100 mg Oral Daily   Continuous Infusions:   Anti-infectives (From admission, onward)    Start     Dose/Rate Route Frequency Ordered Stop   05/24/24 2000  ciprofloxacin  (CIPRO ) tablet 500 mg        500 mg Oral 2 times daily 05/24/24 1712 05/27/24 0830   05/21/24 1000  metroNIDAZOLE  (FLAGYL ) IVPB 500 mg  Status:  Discontinued        500 mg 100 mL/hr over 60 Minutes  Intravenous Every 12 hours 05/20/24 2344 05/21/24 1150   05/21/24 0400  ceFEPIme  (MAXIPIME ) 2 g in sodium chloride  0.9 % 100 mL IVPB  Status:  Discontinued        2 g 200 mL/hr over 30 Minutes Intravenous Every 8 hours 05/20/24 2344 05/21/24 1150   05/18/24 0730  metroNIDAZOLE  (FLAGYL ) IVPB 500 mg  Status:  Discontinued        500 mg 100 mL/hr over 60 Minutes Intravenous Every 12 hours 05/17/24 2053 05/20/24 2344   05/18/24 0330  ceFEPIme  (MAXIPIME ) 2 g in sodium chloride  0.9 % 100 mL IVPB  Status:  Discontinued        2 g 200 mL/hr over 30 Minutes Intravenous Every 8 hours 05/17/24 2053 05/20/24 2344   05/17/24 1930  ceFEPIme  (MAXIPIME ) 2 g in sodium chloride  0.9 % 100 mL IVPB        2 g 200 mL/hr over 30 Minutes Intravenous  Once 05/17/24 1928 05/17/24 2022   05/17/24 1930  metroNIDAZOLE  (FLAGYL ) IVPB 500 mg        500 mg 100 mL/hr over 60 Minutes Intravenous  Once 05/17/24 1928 05/17/24 2059              Family Communication/Anticipated D/C date and plan/Code Status   DVT prophylaxis: SCDs Start: 05/17/24 2001     Code Status: Full Code  Family Communication: None Disposition Plan: Plan to discharge home   Status is: Inpatient Remains inpatient appropriate because: Abdominal pain, large pancreatic pseudocyst       Subjective:   Interval events noted.  She complains of left-sided abdominal pain.  No vomiting or diarrhea.  She is having regular bowel movements.  She was able to tolerate food today.  Objective:    Vitals:   05/27/24 1635 05/27/24 2108 05/28/24 0440 05/28/24 0737  BP: (!) 142/98 129/85 (!) 146/92 (!) 148/88  Pulse: (!) 106 99 94 98  Resp: 18 20 18 18   Temp: 98.7 F (37.1 C) 98.3 F (36.8 C) 98.4 F (36.9 C) (!) 97.5 F (36.4 C)  TempSrc: Oral Oral Oral Oral  SpO2: 97% 96% 93% 96%  Weight:      Height:       No data found.   Intake/Output Summary (Last 24 hours) at 05/28/2024 1407 Last data filed at 05/28/2024 1000 Gross per 24  hour  Intake 480 ml  Output 0 ml  Net 480 ml   Filed Weights   05/18/24 0800 05/24/24 1212 05/25/24 0800  Weight: 108.9 kg 108.9 kg 108 kg    Exam:  GEN: NAD SKIN: Warm and dry EYES: No pallor or icterus ENT: MMM CV: RRR PULM: CTA B ABD: soft, obese, left upper quadrant and left flank tenderness.  No rebound tenderness or guarding, +BS CNS: AAO x 3, non focal EXT: No edema or tenderness        Data Reviewed:   I have personally reviewed following labs and imaging studies:  Labs: Labs show the following:  Basic Metabolic Panel: Recent Labs  Lab 05/23/24 0535 05/25/24 0547 05/26/24 0536  NA 137 136 139  K 3.5 4.0 3.6  CL 105 101 104  CO2 23 24 24   GLUCOSE 117* 117* 109*  BUN 6 6 11   CREATININE 0.61 0.73 0.72  CALCIUM 8.7* 9.1 8.6*  MG  --  1.7 1.8   GFR Estimated Creatinine Clearance: 117.6 mL/min (by C-G formula based on SCr of 0.72 mg/dL). Liver Function Tests: Recent Labs  Lab 05/26/24 0536  AST 12*  ALT 10  ALKPHOS 57  BILITOT 0.3  PROT 5.7*  ALBUMIN 2.4*   Recent Labs  Lab 05/23/24 0535  LIPASE 110*   No results for input(s): AMMONIA in the last 168 hours. Coagulation profile No results for input(s): INR, PROTIME in the last 168 hours.  CBC: Recent Labs  Lab 05/22/24 0406 05/22/24 1700 05/23/24 0535 05/23/24 1637 05/24/24 0434 05/25/24 0547 05/26/24 0536 05/27/24 1438  WBC 9.3  --  11.8*  --   --  11.6* 15.2* 12.1*  NEUTROABS  --   --  7.9*  --   --   --   --   --   HGB 9.7*   < > 10.3* 10.6* 10.3* 10.3* 10.3* 10.9*  HCT 31.5*   < > 32.7* 33.8* 32.6* 32.7* 33.2* 34.9*  MCV 80.6  --  80.1  --   --  80.1 81.2 80.0  PLT 473*  --  482*  --   --  530* 505* 493*   < > = values in this interval not displayed.   Cardiac Enzymes: No results for input(s): CKTOTAL, CKMB, CKMBINDEX, TROPONINI in the last 168 hours. BNP (last 3 results) No results for input(s): PROBNP in the last 8760 hours. CBG: Recent Labs  Lab  05/27/24 1134 05/27/24 1636 05/27/24 2109 05/28/24 0737 05/28/24 1120  GLUCAP 113* 125* 108* 140* 124*   D-Dimer: No results for input(s): DDIMER in the last 72 hours. Hgb A1c: No results for input(s): HGBA1C in the last 72 hours. Lipid Profile: No results for input(s): CHOL, HDL, LDLCALC, TRIG, CHOLHDL, LDLDIRECT in the last 72 hours. Thyroid function studies: No results for input(s): TSH, T4TOTAL, T3FREE, THYROIDAB in the last 72 hours.  Invalid input(s): FREET3 Anemia work up: No results for input(s): VITAMINB12, FOLATE, FERRITIN, TIBC, IRON , RETICCTPCT in the last 72 hours. Sepsis Labs: Recent Labs  Lab 05/23/24 0535 05/25/24 0547 05/26/24 0536 05/27/24 1438  WBC 11.8* 11.6* 15.2* 12.1*    Microbiology Recent Results (from the past 240 hours)  Aerobic/Anaerobic Culture w Gram Stain (surgical/deep wound)     Status: None (Preliminary result)   Collection Time: 05/24/24  1:51 PM   Specimen: PATH Cytology FNA; Body Fluid  Result Value Ref Range Status   Specimen Description FLUID  Final   Special Requests PANCREATIC FLUID  Final   Gram Stain NO WBC SEEN NO ORGANISMS SEEN   Final   Culture   Final    NO GROWTH 4 DAYS NO ANAEROBES ISOLATED; CULTURE IN PROGRESS FOR 5 DAYS Performed at [facility], [address], [place], [place] [address]    Report Status PENDING  Incomplete    Procedures and diagnostic studies:  CT ABDOMEN PELVIS W CONTRAST Result Date: 05/28/2024 CLINICAL DATA:  Severe pancreatitis, pancreatic pseudocyst EXAM: CT ABDOMEN AND PELVIS WITH CONTRAST TECHNIQUE: Multidetector CT imaging of the abdomen and pelvis was performed using the standard protocol following bolus administration of intravenous contrast. RADIATION DOSE REDUCTION: This  exam was performed according to the departmental dose-optimization program which includes automated exposure control, adjustment of the mA and/or kV according to patient size  and/or use of iterative reconstruction technique. CONTRAST:  75mL OMNIPAQUE  IOHEXOL  350 MG/ML SOLN COMPARISON:  05/21/2024 FINDINGS: Lower chest: Severe elevation of the left hemidiaphragm left upper quadrant incompletely imaged. Small left pleural effusion associated atelectasis or consolidation. Hepatobiliary: No focal liver abnormality is seen. Hepatomegaly, maximum coronal span 23.4 cm. Hepatic steatosis. Status post cholecystectomy. No biliary dilatation. Pancreas: Unchanged appearance of the pancreas, with a cystic lesion within the pancreatic tail measuring 2.2 x 1.6 cm (series 3, image 28). Similar inflammatory fat stranding and fluid in the left upper quadrant. Spleen: Normal in size without significant abnormality. Adrenals/Urinary Tract: Adrenal glands are unremarkable. Kidneys are normal, without renal calculi, solid lesion, or hydronephrosis. Bladder is unremarkable. Stomach/Bowel: Stomach is within normal limits. Appendix appears normal. No evidence of bowel wall thickening, distention, or inflammatory changes. Vascular/Lymphatic: Aortic atherosclerosis. No enlarged abdominal or pelvic lymph nodes. Reproductive: No mass or other significant abnormality. IUD present in the fundal endometrial cavity. Other: No abdominal wall hernia or abnormality. Large, complex serpiginous multiloculated fluid collection occupying the majority of the left upper quadrant, about the spleen, gastric fundus, and tip of the pancreas, largest component immediately under the diaphragm measuring 13.4 x 8.4 cm, not significantly changed (series 6, image 6, series 6, image 63). Very slightly diminished volume of free and loculated in the ventral left upper quadrant (series 3, image 61). Musculoskeletal: No acute or significant osseous findings. IMPRESSION: 1. Large, complex serpiginous multiloculated fluid collection occupying the majority of the left upper quadrant, about the spleen, gastric fundus, and tip of the pancreas,  largest component immediately under the diaphragm measuring 13.4 x 8.4 cm, not significantly changed. Very slightly diminished volume of free and loculated fluid in the ventral left upper quadrant. Findings remain consistent with a large pancreatic pseudocyst. 2. Unchanged appearance of the pancreas, with a pseudocyst within the pancreatic tail measuring 2.2 x 1.6 cm. Similar inflammatory fat stranding and fluid in the left upper quadrant. 3. Severe elevation of the left hemidiaphragm, left upper quadrant incompletely imaged. Small left pleural effusion associated atelectasis or consolidation. 4. Hepatomegaly and hepatic steatosis. Aortic Atherosclerosis (ICD10-I70.0). Electronically Signed   By: Marolyn JONETTA Jaksch M.D.   On: 05/28/2024 12:24               LOS: 11 days   Anjana Cheek  [facility]   Pager on [email or web address]. If 7PM-7AM, please contact night-coverage at [email or web address]     05/28/2024, 2:07 PM

## 2024-05-29 DIAGNOSIS — K859 Acute pancreatitis without necrosis or infection, unspecified: Secondary | ICD-10-CM | POA: Diagnosis not present

## 2024-05-29 LAB — GLUCOSE, CAPILLARY
Glucose-Capillary: 154 mg/dL — ABNORMAL HIGH (ref 70–99)
Glucose-Capillary: 110 mg/dL — ABNORMAL HIGH (ref 70–99)
Glucose-Capillary: 110 mg/dL — ABNORMAL HIGH (ref 70–99)
Glucose-Capillary: 114 mg/dL — ABNORMAL HIGH (ref 70–99)

## 2024-05-29 LAB — CBC
HCT: 34.9 % — ABNORMAL LOW (ref 36.0–46.0)
Hemoglobin: 10.8 g/dL — ABNORMAL LOW (ref 12.0–15.0)
MCH: 24.7 pg — ABNORMAL LOW (ref 26.0–34.0)
MCHC: 30.9 g/dL (ref 30.0–36.0)
MCV: 79.7 fL — ABNORMAL LOW (ref 80.0–100.0)
Platelets: 524 K/uL — ABNORMAL HIGH (ref 150–400)
RBC: 4.38 MIL/uL (ref 3.87–5.11)
RDW: 14.8 % (ref 11.5–15.5)
WBC: 12.3 K/uL — ABNORMAL HIGH (ref 4.0–10.5)
nRBC: 0 % (ref 0.0–0.2)

## 2024-05-29 LAB — BASIC METABOLIC PANEL WITH GFR
Anion gap: 9 (ref 5–15)
BUN: 9 mg/dL (ref 6–20)
CO2: 26 mmol/L (ref 22–32)
Calcium: 8.9 mg/dL (ref 8.9–10.3)
Chloride: 102 mmol/L (ref 98–111)
Creatinine, Ser: 0.7 mg/dL (ref 0.44–1.00)
GFR, Estimated: >60 mL/min (ref >60)
Glucose, Bld: 97 mg/dL (ref 70–99)
Potassium: 4.1 mmol/L (ref 3.5–5.1)
Sodium: 137 mmol/L (ref 135–145)

## 2024-05-29 LAB — AEROBIC/ANAEROBIC CULTURE W GRAM STAIN (SURGICAL/DEEP WOUND)
Culture: NO GROWTH
Gram Stain: NONE SEEN

## 2024-05-29 MED ORDER — HYDROMORPHONE HCL 1 MG/ML IJ SOLN
0.5000 mg | Freq: Four times a day (QID) | INTRAMUSCULAR | Status: DC | PRN
  Administered 2024-05-29 – 2024-06-01 (×11): 0.5 mg via INTRAVENOUS
  Filled 2024-05-29 (×11): qty 0.5

## 2024-05-29 MED ORDER — OXYCODONE HCL 5 MG PO TABS
5.0000 mg | ORAL_TABLET | Freq: Four times a day (QID) | ORAL | Status: DC | PRN
  Administered 2024-05-29 – 2024-06-01 (×11): 5 mg via ORAL
  Filled 2024-05-29 (×11): qty 1

## 2024-05-29 NOTE — Progress Notes (Signed)
 Inpatient Progress Note     Patient Profile/Chief Complaint  38 year old female with severe, acute idiopathic pancreatitis diagnosed May 2025 complicated by fluid collection and pseudocyst admitted to the hospital 05/17/2024 with LUQ abdominal/back pain, vomiting and worsening fluid collections on imaging.   Status post EUS 05/24/2024 with 800 mL aspirated from large, complex multiloculated fluid collection.  Cytology negative.  CTAP 7/132025 - no substantial change in fluid collections    Interval History   -- No vomiting x 48 hours -- Has been tolerating Ensure and small meals of solid food since 05/27/2024 -- Continues to endorse left-sided abdominal and back pain requiring oral and IV narcotics -- Tearful regarding social stressors: Paying electricity bill at home   Objective   Vital signs in last 24 hours: Temp:  [97.8 F (36.6 C)-98.2 F (36.8 C)] 97.8 F (36.6 C) (07/14 0757) Pulse Rate:  [81-97] 81 (07/14 0757) Resp:  [18] 18 (07/14 0757) BP: (140-173)/(85-111) 152/90 (07/14 0757) SpO2:  [98 %-100 %] 98 % (07/14 0757) Last BM Date : 05/28/24 (stated per pt at 0100) General:    Alert, tearful, sitting in bed Heart:  Regular rate and rhythm; no murmurs Lungs: Respirations even and unlabored, lungs CTA bilaterally Abdomen:  Soft, nondistended, tender to palpation over left upper quadrant normal bowel sounds. Extremities:  Without edema. Neurologic:  Alert and oriented,  grossly normal neurologically. Psych:  Cooperative. Normal mood and affect.  Intake/Output from previous day: 07/13 0701 - 07/14 0700 In: 616 [P.O.:616] Out: -  Intake/Output this shift: No intake/output data recorded.  Lab Results: Recent Labs    05/27/24 1438 05/29/24 0534  WBC 12.1* 12.3*  HGB 10.9* 10.8*  HCT 34.9* 34.9*  PLT 493* 524*   BMET Recent Labs    05/29/24 0534  NA 137  K 4.1  CL 102  CO2 26  GLUCOSE 97  BUN 9  CREATININE 0.70  CALCIUM 8.9   LFT No results for  input(s): PROT, ALBUMIN, AST, ALT, ALKPHOS, BILITOT, BILIDIR, IBILI in the last 72 hours. PT/INR No results for input(s): LABPROT, INR in the last 72 hours.  Studies/Results: CT ABDOMEN PELVIS W CONTRAST Result Date: 05/28/2024 CLINICAL DATA:  Severe pancreatitis, pancreatic pseudocyst EXAM: CT ABDOMEN AND PELVIS WITH CONTRAST TECHNIQUE: Multidetector CT imaging of the abdomen and pelvis was performed using the standard protocol following bolus administration of intravenous contrast. RADIATION DOSE REDUCTION: This exam was performed according to the departmental dose-optimization program which includes automated exposure control, adjustment of the mA and/or kV according to patient size and/or use of iterative reconstruction technique. CONTRAST:  75mL OMNIPAQUE  IOHEXOL  350 MG/ML SOLN COMPARISON:  05/21/2024 FINDINGS: Lower chest: Severe elevation of the left hemidiaphragm left upper quadrant incompletely imaged. Small left pleural effusion associated atelectasis or consolidation. Hepatobiliary: No focal liver abnormality is seen. Hepatomegaly, maximum coronal span 23.4 cm. Hepatic steatosis. Status post cholecystectomy. No biliary dilatation. Pancreas: Unchanged appearance of the pancreas, with a cystic lesion within the pancreatic tail measuring 2.2 x 1.6 cm (series 3, image 28). Similar inflammatory fat stranding and fluid in the left upper quadrant. Spleen: Normal in size without significant abnormality. Adrenals/Urinary Tract: Adrenal glands are unremarkable. Kidneys are normal, without renal calculi, solid lesion, or hydronephrosis. Bladder is unremarkable. Stomach/Bowel: Stomach is within normal limits. Appendix appears normal. No evidence of bowel wall thickening, distention, or inflammatory changes. Vascular/Lymphatic: Aortic atherosclerosis. No enlarged abdominal or pelvic lymph nodes. Reproductive: No mass or other significant abnormality. IUD present in the fundal endometrial  cavity. Other: No abdominal wall hernia or abnormality. Large, complex serpiginous multiloculated fluid collection occupying the majority of the left upper quadrant, about the spleen, gastric fundus, and tip of the pancreas, largest component immediately under the diaphragm measuring 13.4 x 8.4 cm, not significantly changed (series 6, image 6, series 6, image 63). Very slightly diminished volume of free and loculated in the ventral left upper quadrant (series 3, image 61). Musculoskeletal: No acute or significant osseous findings. IMPRESSION: 1. Large, complex serpiginous multiloculated fluid collection occupying the majority of the left upper quadrant, about the spleen, gastric fundus, and tip of the pancreas, largest component immediately under the diaphragm measuring 13.4 x 8.4 cm, not significantly changed. Very slightly diminished volume of free and loculated fluid in the ventral left upper quadrant. Findings remain consistent with a large pancreatic pseudocyst. 2. Unchanged appearance of the pancreas, with a pseudocyst within the pancreatic tail measuring 2.2 x 1.6 cm. Similar inflammatory fat stranding and fluid in the left upper quadrant. 3. Severe elevation of the left hemidiaphragm, left upper quadrant incompletely imaged. Small left pleural effusion associated atelectasis or consolidation. 4. Hepatomegaly and hepatic steatosis. Aortic Atherosclerosis (ICD10-I70.0). Electronically Signed   By: Marolyn JONETTA Jaksch M.D.   On: 05/28/2024 12:24    Endoscopic Studies: EUS 05/25/2024    Gastritis, s/p biopsy Gastric biopsies unremarkable STOMACH, BIOPSY:  - Gastric antral and oxyntic mucosa with no specific histopathologic  changes  - Helicobacter pylori-like organisms are not identified on routine HE  stain      Clinical Impression   38 year old female with severe, acute idiopathic pancreatitis diagnosed May 2025 complicated by fluid collection and pseudocyst admitted to the hospital 05/17/2024 with  LUQ abdominal/back pain, vomiting and worsening fluid collections on imaging.   Status post EUS 05/24/2024 with 800 mL aspirated from large, complex multiloculated fluid collection.  Cytology negative.  Patient underwent CTAP 05/28/2024 which did not show substantial change in fluid collections.  Today she reports that she has had improvement in tolerance of oral intake.  Has been able to consume liquid and solid foods over the last 48 hours without vomiting.  Continues to experience abdominal and back pain requiring both oral and IV narcotics.  Pain may be a manifestation of her persistent fluid collections.  Discussed that her case is being reviewed with Dr. Wilhelmenia -preliminarily, he anticipates she will need another EUS in the future related to the multi loculated nature of the fluid collection.  Timing of this is to be determined.  Query if she will be a candidate for cyst gastrostomy.     Plan  Case been reviewed with Dr. Wilhelmenia with further recommendations to be provided 05/30/2024 Continue current diet with Ensure Plus shakes Continue pantoprazole  40 mg orally daily Continue pain control  Continue MiraLAX  as needed for constipation Social work consult requested today -patient reports electricity has been turned off at home -request assistance with resources for medical hardship funds for maintaining utilities at home    LOS: 12 days   Inocente CHRISTELLA Hausen  05/29/2024, 2:57 PM  Inocente Hausen, MD [facility]

## 2024-05-29 NOTE — Progress Notes (Signed)
 Progress Note    Katrina Lambert  FMW:[id number] DOB: [birth year]  DOA: 05/17/2024 PCP: [facility]      Brief Narrative:    Medical records reviewed and are as summarized below:  ENYA BUREAU is a 38 y.o. female with PMH of DM-2, HTN, asthma, IDA, morbid obesity, B12 deficiency, cervical myelopathy, tobacco use disorder and recent hospitalization at [facility] from 5/3-5/6 for idiopathic pancreatitis and at [facility] 5/31/611 for recurrent pancreatitis with pseudocyst as well as hospital-acquired pneumonia complicated by parapneumonic effusion requiring chest tube placement returning to ED with worsening intermittent abdominal pain, nausea and vomiting since her discharge from the hospital. Being managed for abdominal pain in the setting of pancreatic pseudocyst, s/p percutaneous cyst drainage.       Assessment/Plan:   Principal Problem:   Pancreatic abscess Active Problems:   Acute pancreatitis   Pleural effusion on left   Left upper quadrant pain   Nausea in adult   Encounter for smoking cessation counseling   Acute pancreatic fluid collection   Malnutrition of moderate degree   Pancreatic pseudocyst   Gastritis without bleeding   Nutrition Problem: Moderate Malnutrition Etiology: acute illness  Signs/Symptoms: percent weight loss, mild muscle depletion Percent weight loss: 7.7 %   Body mass index is 39.62 kg/m.  (Class II obesity)   Acute on chronic pancreatitis with pancreatic pseudocyst - S/p CT-guided diagnostic aspiration 7/3.  S/p EGD with EUS assisted drainage 7/9.  Drained 800 mL cyst.   Continue analgesics as needed for pain. Repeat CT shows essentially unchanged cyst (meaning fluid has re-accumulated). Tolerating diet. Per GI, will need repeat EUS with aspiration this week, timing TBD.   SIRS with lactic acidosis, tachycardia and leukocytosis.  Leukocytosis resolved.   -Sepsis ruled out. Continue supportive care   Type II diabetes mellitus - A1c  7.3. NovoLog  as needed per sliding scale.   Hypokalemia/hypomagnesemia Improved.  Replete as needed  Comorbidities including hypertension, tobacco use disorder on nicotine  patch, obesity     Diet Order             Diet Heart Fluid consistency: Thin  Diet effective now                            Consultants: Gastroenterologist  Procedures: - S/p CT-guided diagnostic aspiration 7/3.   S/p EGD with EUS assisted drainage 7/9.  Drained 800 mL cyst.     Medications:    feeding supplement  237 mL Oral BID BM   insulin  aspart  0-9 Units Subcutaneous TID WC   multivitamin with minerals  1 tablet Oral Daily   nicotine   14 mg Transdermal Daily   pantoprazole   40 mg Oral Q1200   sodium chloride  flush  3 mL Intravenous Q12H   Continuous Infusions:   Anti-infectives (From admission, onward)    Start     Dose/Rate Route Frequency Ordered Stop   05/24/24 2000  ciprofloxacin  (CIPRO ) tablet 500 mg        500 mg Oral 2 times daily 05/24/24 1712 05/27/24 0830   05/21/24 1000  metroNIDAZOLE  (FLAGYL ) IVPB 500 mg  Status:  Discontinued        500 mg 100 mL/hr over 60 Minutes Intravenous Every 12 hours 05/20/24 2344 05/21/24 1150   05/21/24 0400  ceFEPIme  (MAXIPIME ) 2 g in sodium chloride  0.9 % 100 mL IVPB  Status:  Discontinued        2  g 200 mL/hr over 30 Minutes Intravenous Every 8 hours 05/20/24 2344 05/21/24 1150   05/18/24 0730  metroNIDAZOLE  (FLAGYL ) IVPB 500 mg  Status:  Discontinued        500 mg 100 mL/hr over 60 Minutes Intravenous Every 12 hours 05/17/24 2053 05/20/24 2344   05/18/24 0330  ceFEPIme  (MAXIPIME ) 2 g in sodium chloride  0.9 % 100 mL IVPB  Status:  Discontinued        2 g 200 mL/hr over 30 Minutes Intravenous Every 8 hours 05/17/24 2053 05/20/24 2344   05/17/24 1930  ceFEPIme  (MAXIPIME ) 2 g in sodium chloride  0.9 % 100 mL IVPB        2 g 200 mL/hr over 30 Minutes Intravenous  Once 05/17/24 1928 05/17/24 2022   05/17/24 1930  metroNIDAZOLE   (FLAGYL ) IVPB 500 mg        500 mg 100 mL/hr over 60 Minutes Intravenous  Once 05/17/24 1928 05/17/24 2059              Family Communication/Anticipated D/C date and plan/Code Status   DVT prophylaxis: SCDs Start: 05/17/24 2001     Code Status: Full Code  Family Communication: None Disposition Plan: home    Status is: Inpatient Remains inpatient appropriate because: need for repeat EUS with aspiration       Subjective:   Reports stable moderate epigastric and LUQ pain. Tolerating diet, BM earlier today.  Objective:    Vitals:   05/28/24 1700 05/28/24 2025 05/29/24 0500 05/29/24 0757  BP: (!) 159/106 (!) 140/85 (!) 173/111 (!) 152/90  Pulse: 97 86 92 81  Resp:  18  18  Temp: 98 F (36.7 C) 98.2 F (36.8 C) 98.2 F (36.8 C) 97.8 F (36.6 C)  TempSrc: Oral  Oral Oral  SpO2: 98% 100%  98%  Weight:      Height:       No data found.  No intake or output data in the 24 hours ending 05/29/24 1405  Filed Weights   05/18/24 0800 05/24/24 1212 05/25/24 0800  Weight: 108.9 kg 108.9 kg 108 kg    Exam:  GEN: NAD SKIN: Warm and dry EYES: No pallor or icterus ENT: MMM CV: RRR PULM: CTA B ABD: soft, obese, left upper quadrant and left flank tenderness.  No rebound tenderness or guarding, +BS CNS: AAO x 3, non focal EXT: No edema or tenderness        Data Reviewed:   I have personally reviewed following labs and imaging studies:  Labs: Labs show the following:   Basic Metabolic Panel: Recent Labs  Lab 05/23/24 0535 05/25/24 0547 05/26/24 0536 05/29/24 0534  NA 137 136 139 137  K 3.5 4.0 3.6 4.1  CL 105 101 104 102  CO2 23 24 24 26   GLUCOSE 117* 117* 109* 97  BUN 6 6 11 9   CREATININE 0.61 0.73 0.72 0.70  CALCIUM 8.7* 9.1 8.6* 8.9  MG  --  1.7 1.8  --    GFR Estimated Creatinine Clearance: 117.6 mL/min (by C-G formula based on SCr of 0.7 mg/dL). Liver Function Tests: Recent Labs  Lab 05/26/24 0536  AST 12*  ALT 10  ALKPHOS  57  BILITOT 0.3  PROT 5.7*  ALBUMIN 2.4*   Recent Labs  Lab 05/23/24 0535  LIPASE 110*   No results for input(s): AMMONIA in the last 168 hours. Coagulation profile No results for input(s): INR, PROTIME in the last 168 hours.  CBC: Recent Labs  Lab 05/23/24  [id number] 05/23/24 1637 05/24/24 0434 05/25/24 0547 05/26/24 0536 05/27/24 1438 05/29/24 0534  WBC 11.8*  --   --  11.6* 15.2* 12.1* 12.3*  NEUTROABS 7.9*  --   --   --   --   --   --   HGB 10.3*   < > 10.3* 10.3* 10.3* 10.9* 10.8*  HCT 32.7*   < > 32.6* 32.7* 33.2* 34.9* 34.9*  MCV 80.1  --   --  80.1 81.2 80.0 79.7*  PLT 482*  --   --  530* 505* 493* 524*   < > = values in this interval not displayed.   Cardiac Enzymes: No results for input(s): CKTOTAL, CKMB, CKMBINDEX, TROPONINI in the last 168 hours. BNP (last 3 results) No results for input(s): PROBNP in the last 8760 hours. CBG: Recent Labs  Lab 05/28/24 1658 05/28/24 2025 05/28/24 2116 05/29/24 0758 05/29/24 1125  GLUCAP 103* 105* 151* 154* 110*   D-Dimer: No results for input(s): DDIMER in the last 72 hours. Hgb A1c: No results for input(s): HGBA1C in the last 72 hours. Lipid Profile: No results for input(s): CHOL, HDL, LDLCALC, TRIG, CHOLHDL, LDLDIRECT in the last 72 hours. Thyroid function studies: No results for input(s): TSH, T4TOTAL, T3FREE, THYROIDAB in the last 72 hours.  Invalid input(s): FREET3 Anemia work up: No results for input(s): VITAMINB12, FOLATE, FERRITIN, TIBC, IRON , RETICCTPCT in the last 72 hours. Sepsis Labs: Recent Labs  Lab 05/25/24 0547 05/26/24 0536 05/27/24 1438 05/29/24 0534  WBC 11.6* 15.2* 12.1* 12.3*    Microbiology Recent Results (from the past 240 hours)  Aerobic/Anaerobic Culture w Gram Stain (surgical/deep wound)     Status: None (Preliminary result)   Collection Time: 05/24/24  1:51 PM   Specimen: PATH Cytology FNA; Body Fluid  Result Value Ref Range  Status   Specimen Description FLUID  Final   Special Requests PANCREATIC FLUID  Final   Gram Stain NO WBC SEEN NO ORGANISMS SEEN   Final   Culture   Final    NO GROWTH 4 DAYS NO ANAEROBES ISOLATED; CULTURE IN PROGRESS FOR 5 DAYS Performed at [facility], [address] [place], [place] [address]    Report Status PENDING  Incomplete    Procedures and diagnostic studies:  CT ABDOMEN PELVIS W CONTRAST Result Date: 05/28/2024 CLINICAL DATA:  Severe pancreatitis, pancreatic pseudocyst EXAM: CT ABDOMEN AND PELVIS WITH CONTRAST TECHNIQUE: Multidetector CT imaging of the abdomen and pelvis was performed using the standard protocol following bolus administration of intravenous contrast. RADIATION DOSE REDUCTION: This exam was performed according to the departmental dose-optimization program which includes automated exposure control, adjustment of the mA and/or kV according to patient size and/or use of iterative reconstruction technique. CONTRAST:  75mL OMNIPAQUE  IOHEXOL  350 MG/ML SOLN COMPARISON:  05/21/2024 FINDINGS: Lower chest: Severe elevation of the left hemidiaphragm left upper quadrant incompletely imaged. Small left pleural effusion associated atelectasis or consolidation. Hepatobiliary: No focal liver abnormality is seen. Hepatomegaly, maximum coronal span 23.4 cm. Hepatic steatosis. Status post cholecystectomy. No biliary dilatation. Pancreas: Unchanged appearance of the pancreas, with a cystic lesion within the pancreatic tail measuring 2.2 x 1.6 cm (series 3, image 28). Similar inflammatory fat stranding and fluid in the left upper quadrant. Spleen: Normal in size without significant abnormality. Adrenals/Urinary Tract: Adrenal glands are unremarkable. Kidneys are normal, without renal calculi, solid lesion, or hydronephrosis. Bladder is unremarkable. Stomach/Bowel: Stomach is within normal limits. Appendix appears normal. No evidence of bowel wall thickening, distention, or inflammatory  changes. Vascular/Lymphatic: Aortic atherosclerosis. No enlarged abdominal or pelvic lymph nodes. Reproductive: No mass or other significant abnormality. IUD present in the fundal endometrial cavity. Other: No abdominal wall hernia or abnormality. Large, complex serpiginous multiloculated fluid collection occupying the majority of the left upper quadrant, about the spleen, gastric fundus, and tip of the pancreas, largest component immediately under the diaphragm measuring 13.4 x 8.4 cm, not significantly changed (series 6, image 6, series 6, image 63). Very slightly diminished volume of free and loculated in the ventral left upper quadrant (series 3, image 61). Musculoskeletal: No acute or significant osseous findings. IMPRESSION: 1. Large, complex serpiginous multiloculated fluid collection occupying the majority of the left upper quadrant, about the spleen, gastric fundus, and tip of the pancreas, largest component immediately under the diaphragm measuring 13.4 x 8.4 cm, not significantly changed. Very slightly diminished volume of free and loculated fluid in the ventral left upper quadrant. Findings remain consistent with a large pancreatic pseudocyst. 2. Unchanged appearance of the pancreas, with a pseudocyst within the pancreatic tail measuring 2.2 x 1.6 cm. Similar inflammatory fat stranding and fluid in the left upper quadrant. 3. Severe elevation of the left hemidiaphragm, left upper quadrant incompletely imaged. Small left pleural effusion associated atelectasis or consolidation. 4. Hepatomegaly and hepatic steatosis. Aortic Atherosclerosis (ICD10-I70.0). Electronically Signed   By: Marolyn JONETTA Jaksch M.D.   On: 05/28/2024 12:24               LOS: 12 days   Devaughn KATHEE Ban , MD [facility]   Pager on [email or web address]. If 7PM-7AM, please contact night-coverage at [email or web address]     05/29/2024, 2:05 PM

## 2024-05-29 NOTE — TOC Progression Note (Addendum)
 Transition of Care [facility]) - Progression Note    Patient Details  Name: Katrina Lambert MRN: [id number] Date of Birth: 22-Sep-1986  Transition of Care [facility] CM/SW Contact  Tom-Johnson, Yer Olivencia Daphne, RN Phone Number: 05/29/2024, 3:58 PM  Clinical Narrative:     Patient continues to have LUQ/Back pain requiring both oral and IV pain management.  Patient underwent CTAP yesterday 05/28/24 which  showed no substantial change in fluid collections. Repeat CT showed unchanged Cyst. Patient is tolerating her diet. GI following.   CM consulted for Financial assist with Bill. Resources given to patient at bedside.      Patient not Medically ready for discharge.  CM will continue to follow for discharge needs as patient progresses with care towards discharge.              Expected Discharge Plan and Services                                               Social Determinants of Health (SDOH) Interventions SDOH Screenings   Food Insecurity: No Food Insecurity (05/18/2024)  Housing: Low Risk  (05/18/2024)  Transportation Needs: No Transportation Needs (05/18/2024)  Recent Concern: Transportation Needs - Unmet Transportation Needs (03/18/2024)   [facility]  Utilities: Not At Risk (05/18/2024)  Recent Concern: Utilities - High Risk (03/20/2024)   Received [facility]  Financial Resource Strain: Low Risk  (03/18/2024)   Received from [facility]  Physical Activity: Inactive (03/18/2024)   Received from [facility]  Social Connections: Socially Isolated (03/18/2024)   Received from [facility]  Stress: Stress Concern Present (03/18/2024)   Received from [facility]  Tobacco Use: High Risk (05/24/2024)  Health Literacy: Low Risk  (03/18/2024)   Received from [facility]    Readmission Risk Interventions    05/18/2024    2:37 PM  Readmission Risk Prevention Plan  Transportation Screening Complete  PCP or Specialist Appt within 5-7 Days Complete   Home Care Screening Complete  Medication Review (RN CM) Referral to Pharmacy

## 2024-05-29 NOTE — H&P (View-Only) (Signed)
 Inpatient Progress Note     Patient Profile/Chief Complaint  38 year old female with severe, acute idiopathic pancreatitis diagnosed May 2025 complicated by fluid collection and pseudocyst admitted to the hospital 05/17/2024 with LUQ abdominal/back pain, vomiting and worsening fluid collections on imaging.   Status post EUS 05/24/2024 with 800 mL aspirated from large, complex multiloculated fluid collection.  Cytology negative.  CTAP 7/132025 - no substantial change in fluid collections    Interval History   -- No vomiting x 48 hours -- Has been tolerating Ensure and small meals of solid food since 05/27/2024 -- Continues to endorse left-sided abdominal and back pain requiring oral and IV narcotics -- Tearful regarding social stressors: Paying electricity bill at home   Objective   Vital signs in last 24 hours: Temp:  [97.8 F (36.6 C)-98.2 F (36.8 C)] 97.8 F (36.6 C) (07/14 0757) Pulse Rate:  [81-97] 81 (07/14 0757) Resp:  [18] 18 (07/14 0757) BP: (140-173)/(85-111) 152/90 (07/14 0757) SpO2:  [98 %-100 %] 98 % (07/14 0757) Last BM Date : 05/28/24 (stated per pt at 0100) General:    Alert, tearful, sitting in bed Heart:  Regular rate and rhythm; no murmurs Lungs: Respirations even and unlabored, lungs CTA bilaterally Abdomen:  Soft, nondistended, tender to palpation over left upper quadrant normal bowel sounds. Extremities:  Without edema. Neurologic:  Alert and oriented,  grossly normal neurologically. Psych:  Cooperative. Normal mood and affect.  Intake/Output from previous day: 07/13 0701 - 07/14 0700 In: 616 [P.O.:616] Out: -  Intake/Output this shift: No intake/output data recorded.  Lab Results: Recent Labs    05/27/24 1438 05/29/24 0534  WBC 12.1* 12.3*  HGB 10.9* 10.8*  HCT 34.9* 34.9*  PLT 493* 524*   BMET Recent Labs    05/29/24 0534  NA 137  K 4.1  CL 102  CO2 26  GLUCOSE 97  BUN 9  CREATININE 0.70  CALCIUM 8.9   LFT No results for  input(s): PROT, ALBUMIN, AST, ALT, ALKPHOS, BILITOT, BILIDIR, IBILI in the last 72 hours. PT/INR No results for input(s): LABPROT, INR in the last 72 hours.  Studies/Results: CT ABDOMEN PELVIS W CONTRAST Result Date: 05/28/2024 CLINICAL DATA:  Severe pancreatitis, pancreatic pseudocyst EXAM: CT ABDOMEN AND PELVIS WITH CONTRAST TECHNIQUE: Multidetector CT imaging of the abdomen and pelvis was performed using the standard protocol following bolus administration of intravenous contrast. RADIATION DOSE REDUCTION: This exam was performed according to the departmental dose-optimization program which includes automated exposure control, adjustment of the mA and/or kV according to patient size and/or use of iterative reconstruction technique. CONTRAST:  75mL OMNIPAQUE  IOHEXOL  350 MG/ML SOLN COMPARISON:  05/21/2024 FINDINGS: Lower chest: Severe elevation of the left hemidiaphragm left upper quadrant incompletely imaged. Small left pleural effusion associated atelectasis or consolidation. Hepatobiliary: No focal liver abnormality is seen. Hepatomegaly, maximum coronal span 23.4 cm. Hepatic steatosis. Status post cholecystectomy. No biliary dilatation. Pancreas: Unchanged appearance of the pancreas, with a cystic lesion within the pancreatic tail measuring 2.2 x 1.6 cm (series 3, image 28). Similar inflammatory fat stranding and fluid in the left upper quadrant. Spleen: Normal in size without significant abnormality. Adrenals/Urinary Tract: Adrenal glands are unremarkable. Kidneys are normal, without renal calculi, solid lesion, or hydronephrosis. Bladder is unremarkable. Stomach/Bowel: Stomach is within normal limits. Appendix appears normal. No evidence of bowel wall thickening, distention, or inflammatory changes. Vascular/Lymphatic: Aortic atherosclerosis. No enlarged abdominal or pelvic lymph nodes. Reproductive: No mass or other significant abnormality. IUD present in the fundal endometrial  cavity. Other: No abdominal wall hernia or abnormality. Large, complex serpiginous multiloculated fluid collection occupying the majority of the left upper quadrant, about the spleen, gastric fundus, and tip of the pancreas, largest component immediately under the diaphragm measuring 13.4 x 8.4 cm, not significantly changed (series 6, image 6, series 6, image 63). Very slightly diminished volume of free and loculated in the ventral left upper quadrant (series 3, image 61). Musculoskeletal: No acute or significant osseous findings. IMPRESSION: 1. Large, complex serpiginous multiloculated fluid collection occupying the majority of the left upper quadrant, about the spleen, gastric fundus, and tip of the pancreas, largest component immediately under the diaphragm measuring 13.4 x 8.4 cm, not significantly changed. Very slightly diminished volume of free and loculated fluid in the ventral left upper quadrant. Findings remain consistent with a large pancreatic pseudocyst. 2. Unchanged appearance of the pancreas, with a pseudocyst within the pancreatic tail measuring 2.2 x 1.6 cm. Similar inflammatory fat stranding and fluid in the left upper quadrant. 3. Severe elevation of the left hemidiaphragm, left upper quadrant incompletely imaged. Small left pleural effusion associated atelectasis or consolidation. 4. Hepatomegaly and hepatic steatosis. Aortic Atherosclerosis (ICD10-I70.0). Electronically Signed   By: Marolyn JONETTA Jaksch M.D.   On: 05/28/2024 12:24    Endoscopic Studies: EUS 05/25/2024    Gastritis, s/p biopsy Gastric biopsies unremarkable STOMACH, BIOPSY:  - Gastric antral and oxyntic mucosa with no specific histopathologic  changes  - Helicobacter pylori-like organisms are not identified on routine HE  stain      Clinical Impression   38 year old female with severe, acute idiopathic pancreatitis diagnosed May 2025 complicated by fluid collection and pseudocyst admitted to the hospital 05/17/2024 with  LUQ abdominal/back pain, vomiting and worsening fluid collections on imaging.   Status post EUS 05/24/2024 with 800 mL aspirated from large, complex multiloculated fluid collection.  Cytology negative.  Patient underwent CTAP 05/28/2024 which did not show substantial change in fluid collections.  Today she reports that she has had improvement in tolerance of oral intake.  Has been able to consume liquid and solid foods over the last 48 hours without vomiting.  Continues to experience abdominal and back pain requiring both oral and IV narcotics.  Pain may be a manifestation of her persistent fluid collections.  Discussed that her case is being reviewed with Dr. Wilhelmenia -preliminarily, he anticipates she will need another EUS in the future related to the multi loculated nature of the fluid collection.  Timing of this is to be determined.  Query if she will be a candidate for cyst gastrostomy.     Plan  Case been reviewed with Dr. Wilhelmenia with further recommendations to be provided 05/30/2024 Continue current diet with Ensure Plus shakes Continue pantoprazole  40 mg orally daily Continue pain control  Continue MiraLAX  as needed for constipation Social work consult requested today -patient reports electricity has been turned off at home -request assistance with resources for medical hardship funds for maintaining utilities at home    LOS: 12 days   Katrina Lambert  05/29/2024, 2:57 PM  Katrina Hausen, MD [facility]

## 2024-05-30 ENCOUNTER — Encounter (HOSPITAL_COMMUNITY)
Admission: EM | Disposition: A | Discharge status: Other Acute Inpatient Hospital | Payer: Self-pay | Source: Home / Self Care | Attending: Internal Medicine

## 2024-05-30 ENCOUNTER — Inpatient Hospital Stay (HOSPITAL_COMMUNITY): Payer: MEDICAID | Admitting: Certified Registered"

## 2024-05-30 ENCOUNTER — Encounter (HOSPITAL_COMMUNITY): Payer: Self-pay | Admitting: Internal Medicine

## 2024-05-30 ENCOUNTER — Inpatient Hospital Stay (HOSPITAL_COMMUNITY): Payer: MEDICAID

## 2024-05-30 DIAGNOSIS — K2970 Gastritis, unspecified, without bleeding: Secondary | ICD-10-CM

## 2024-05-30 DIAGNOSIS — K863 Pseudocyst of pancreas: Secondary | ICD-10-CM | POA: Diagnosis not present

## 2024-05-30 DIAGNOSIS — K859 Acute pancreatitis without necrosis or infection, unspecified: Secondary | ICD-10-CM

## 2024-05-30 DIAGNOSIS — R1084 Generalized abdominal pain: Secondary | ICD-10-CM

## 2024-05-30 DIAGNOSIS — F17210 Nicotine dependence, cigarettes, uncomplicated: Secondary | ICD-10-CM

## 2024-05-30 DIAGNOSIS — I10 Essential (primary) hypertension: Secondary | ICD-10-CM

## 2024-05-30 DIAGNOSIS — R1012 Left upper quadrant pain: Secondary | ICD-10-CM

## 2024-05-30 DIAGNOSIS — K869 Disease of pancreas, unspecified: Secondary | ICD-10-CM | POA: Diagnosis not present

## 2024-05-30 DIAGNOSIS — K8689 Other specified diseases of pancreas: Secondary | ICD-10-CM

## 2024-05-30 DIAGNOSIS — K3189 Other diseases of stomach and duodenum: Secondary | ICD-10-CM | POA: Diagnosis not present

## 2024-05-30 DIAGNOSIS — K2289 Other specified disease of esophagus: Secondary | ICD-10-CM

## 2024-05-30 DIAGNOSIS — D734 Cyst of spleen: Secondary | ICD-10-CM

## 2024-05-30 LAB — COMPREHENSIVE METABOLIC PANEL WITH GFR
ALT: 11 U/L (ref 0–44)
AST: 13 U/L — ABNORMAL LOW (ref 15–41)
Albumin: 2.5 g/dL — ABNORMAL LOW (ref 3.5–5.0)
Alkaline Phosphatase: 69 U/L (ref 38–126)
Anion gap: 12 (ref 5–15)
BUN: 13 mg/dL (ref 6–20)
CO2: 24 mmol/L (ref 22–32)
Calcium: 9.2 mg/dL (ref 8.9–10.3)
Chloride: 101 mmol/L (ref 98–111)
Creatinine, Ser: 0.8 mg/dL (ref 0.44–1.00)
GFR, Estimated: >60 mL/min (ref >60)
Glucose, Bld: 115 mg/dL — ABNORMAL HIGH (ref 70–99)
Potassium: 4.1 mmol/L (ref 3.5–5.1)
Sodium: 137 mmol/L (ref 135–145)
Total Bilirubin: 0.3 mg/dL (ref 0.0–1.2)
Total Protein: 6.3 g/dL — ABNORMAL LOW (ref 6.5–8.1)

## 2024-05-30 LAB — GLUCOSE, CAPILLARY
Glucose-Capillary: 144 mg/dL — ABNORMAL HIGH (ref 70–99)
Glucose-Capillary: 84 mg/dL (ref 70–99)
Glucose-Capillary: 113 mg/dL — ABNORMAL HIGH (ref 70–99)
Glucose-Capillary: 126 mg/dL — ABNORMAL HIGH (ref 70–99)

## 2024-05-30 SURGERY — EGD (ESOPHAGOGASTRODUODENOSCOPY)
Anesthesia: Monitor Anesthesia Care

## 2024-05-30 MED ORDER — PROPOFOL 10 MG/ML IV BOLUS
INTRAVENOUS | Status: DC | PRN
  Administered 2024-05-30: 30 mg via INTRAVENOUS
  Administered 2024-05-30: 25 mg via INTRAVENOUS

## 2024-05-30 MED ORDER — CIPROFLOXACIN HCL 500 MG PO TABS
500.0000 mg | ORAL_TABLET | Freq: Two times a day (BID) | ORAL | Status: AC
  Administered 2024-05-30 – 2024-06-02 (×6): 500 mg via ORAL
  Filled 2024-05-30 (×8): qty 1

## 2024-05-30 MED ORDER — PHENYLEPHRINE 80 MCG/ML (10ML) SYRINGE FOR IV PUSH (FOR BLOOD PRESSURE SUPPORT)
PREFILLED_SYRINGE | INTRAVENOUS | Status: DC | PRN
  Administered 2024-05-30 (×2): 80 ug via INTRAVENOUS
  Administered 2024-05-30: 160 ug via INTRAVENOUS

## 2024-05-30 MED ORDER — ORAL CARE MOUTH RINSE: 15.0000 mL | OROMUCOSAL | Status: DC | PRN

## 2024-05-30 MED ORDER — LIDOCAINE 2% (20 MG/ML) 5 ML SYRINGE
INTRAMUSCULAR | Status: DC | PRN
  Administered 2024-05-30: 50 mg via INTRAVENOUS

## 2024-05-30 MED ORDER — IPRATROPIUM-ALBUTEROL 0.5-2.5 (3) MG/3ML IN SOLN
3.0000 mL | RESPIRATORY_TRACT | Status: DC | PRN
  Administered 2024-05-31 – 2024-06-01 (×2): 3 mL via RESPIRATORY_TRACT
  Filled 2024-05-30 (×2): qty 3

## 2024-05-30 MED ORDER — SODIUM CHLORIDE 0.9 % IV SOLN: INTRAVENOUS | Status: DC | PRN

## 2024-05-30 MED ORDER — CIPROFLOXACIN IN D5W 400 MG/200ML IV SOLN
INTRAVENOUS | Status: DC | PRN
Start: 2024-05-30 — End: 2024-05-30
  Administered 2024-05-30: 400 mg via INTRAVENOUS

## 2024-05-30 MED ORDER — IOHEXOL 350 MG/ML SOLN
75.0000 mL | Freq: Once | INTRAVENOUS | Status: AC | PRN
  Administered 2024-05-30: 75 mL via INTRAVENOUS

## 2024-05-30 MED ORDER — HYDRALAZINE HCL 20 MG/ML IJ SOLN: 10.0000 mg | INTRAMUSCULAR | Status: DC | PRN

## 2024-05-30 MED ORDER — CIPROFLOXACIN IN D5W 400 MG/200ML IV SOLN
INTRAVENOUS | Status: AC
  Filled 2024-05-30: qty 200

## 2024-05-30 MED ORDER — PROPOFOL 500 MG/50ML IV EMUL
INTRAVENOUS | Status: DC | PRN
  Administered 2024-05-30: 150 ug/kg/min via INTRAVENOUS

## 2024-05-30 MED ORDER — DEXMEDETOMIDINE HCL IN NACL 80 MCG/20ML IV SOLN
INTRAVENOUS | Status: DC | PRN
Start: 2024-05-30 — End: 2024-05-30
  Administered 2024-05-30 (×2): 8 ug via INTRAVENOUS

## 2024-05-30 MED ORDER — METOPROLOL TARTRATE 5 MG/5ML IV SOLN: 5.0000 mg | INTRAVENOUS | Status: DC | PRN

## 2024-05-30 MED ORDER — GLUCAGON HCL RDNA (DIAGNOSTIC) 1 MG IJ SOLR: 1.0000 mg | INTRAMUSCULAR | Status: DC | PRN

## 2024-05-30 NOTE — Progress Notes (Signed)
 Pt NPO at this time for possible procedure this afternoon. Pt educated and verbalized an understanding, call bell in reach, will monitor

## 2024-05-30 NOTE — Plan of Care (Signed)
  Problem: Health Behavior/Discharge Planning: Goal: Ability to manage health-related needs will improve Outcome: Progressing   Problem: Nutrition: Goal: Adequate nutrition will be maintained Outcome: Progressing   Problem: Coping: Goal: Level of anxiety will decrease Outcome: Progressing   Problem: Pain Managment: Goal: General experience of comfort will improve and/or be controlled Outcome: Progressing   Problem: Safety: Goal: Ability to remain free from injury will improve Outcome: Progressing

## 2024-05-30 NOTE — Transfer of Care (Signed)
 Immediate Anesthesia Transfer of Care Note  Patient: Katrina Lambert  Procedure(s) Performed: EGD (ESOPHAGOGASTRODUODENOSCOPY) ULTRASOUND, UPPER GI TRACT, ENDOSCOPIC FINE NEEDLE ASPIRATION BIOPSY  Patient Location: PACU  Anesthesia Type:MAC  Level of Consciousness: awake and drowsy  Airway & Oxygen Therapy: Patient Spontanous Breathing and Patient connected to nasal cannula oxygen  Post-op Assessment: Report given to RN and Post -op Vital signs reviewed and stable  Post vital signs: Reviewed and stable  Last Vitals:  Vitals Value Taken Time  BP    Temp    Pulse 73 05/30/24 16:42  Resp 18 05/30/24 16:42  SpO2 92 % 05/30/24 16:42  Vitals shown include unfiled device data.  Last Pain:  Vitals:   05/30/24 1427  TempSrc: Temporal  PainSc: 6       Patients Stated Pain Goal: 5 (05/28/24 0850)  Complications: No notable events documented.

## 2024-05-30 NOTE — Anesthesia Postprocedure Evaluation (Signed)
 Anesthesia Post Note  Patient: Katrina Lambert  Procedure(s) Performed: EGD (ESOPHAGOGASTRODUODENOSCOPY) ULTRASOUND, UPPER GI TRACT, ENDOSCOPIC FINE NEEDLE ASPIRATION BIOPSY     Patient location during evaluation: PACU Anesthesia Type: MAC Level of consciousness: awake and alert Pain management: pain level controlled Vital Signs Assessment: post-procedure vital signs reviewed and stable Respiratory status: spontaneous breathing, nonlabored ventilation, respiratory function stable and patient connected to nasal cannula oxygen Cardiovascular status: stable and blood pressure returned to baseline Postop Assessment: no apparent nausea or vomiting Anesthetic complications: no   No notable events documented.  Last Vitals:  Vitals:   05/30/24 1655 05/30/24 1700  BP:  120/78  Pulse: 74 74  Resp: (!) 0 12  Temp:    SpO2: 93% 95%    Last Pain:  Vitals:   05/30/24 1700  TempSrc:   PainSc: 0-No pain                 Garnette DELENA Gab

## 2024-05-30 NOTE — Progress Notes (Addendum)
 Nutrition Follow-up  DOCUMENTATION CODES:   Non-severe (moderate) malnutrition in context of acute illness/injury  INTERVENTION:  Given prolonged poor PO intake PTA and thus far this hospitalization, recommend either full or supplemental nutrition support if unable to advance diet  Case discussed with MD; advanced to heart healthy diet today and assess for tolerance and PO adequacy Continue Ensure Plus High Protein po BID, each supplement provides 350 kcal and 20 grams of protein  Continue Magic cup TID with meals, each supplement provides 290 kcal and 9 grams of protein  Continue MVI with minerals daily    NUTRITION DIAGNOSIS:  Moderate Malnutrition related to acute illness as evidenced by percent weight loss, mild muscle depletion.  GOAL:  Patient will meet greater than or equal to 90% of their needs  MONITOR:  PO intake, Supplement acceptance, Diet advancement, Labs  REASON FOR ASSESSMENT:  NPO/Clear Liquid Diet    ASSESSMENT:   Pt with PMH significant for: T2DM, HTN, asthma, IDA, morbid obesity, B12 deficiency, cervical myelopathy, tobacco use disorder. Presented to [facility] with worsening intermittent abdominal pain and persistent N/V since her recent discharge. Of note, she was hospitalized at [facility] from 5/3-5/6  for idiopathic pancreatitis and at [facility] from 5/31 - 6/11 for recurrent pancreatitis w/ pseudocyst c/b hospital acquired PNA requiring chest tube placement. 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  7/2 admitted 7/3 advanced to clear liquid diet 7/4 NPO most of the day; CLD reinstated 7/6 advanced to full liquid diet 7/7 advanced to GI soft diet 7/8 downgraded to clear liquid diet d/y diet intolerance 7/9 s/p EUS- revealed irregular z-line (biopsied), erythematous mucosa in the stomach (biopsied), advanced to clear liquid diet 7/10 EUS: no CDL, multiple pancreatic and peripancreatic and perisplenic collections present; drained 800 cc  from cyst most distal to the perisplenic component; advanced to full liquid diet  7/11 advanced to heart healthy diet 7/13 follow up CT: fluid collection in abdomen unchanged (despite aspiration) and occupying majority of LUQ 7/15 repeat EUS and EGD w/ aspiration of pancreatic pseudocyst  Repeat CT shows unchanged cyst, despite aspiration of cyst last week. Fluid re-accumulated. She was unavailable today for bedside visit as she is undergoing another EUS and EGD this afternoon.    Average Meal Intake 7/10: 100% x1 documented meal 7/13: 50-75% x2 documented meals   Diet has been advanced to heart healthy. Patient tolerating small amounts of intake since 7/12, however not sufficient enough to meet her estimated calorie and protein needs. Still endorsing abdominal pain to LUQ.   Given prolonged poor PO intake PTA and thus far this hospitalization, recommend either full or supplemental nutrition support if unable to advance diet.    Admit Weight: 108 kg Current Weight: 108.9 kg  Weight stable compared to admission. She has no edema on exam. Bowels stable at baseline. She reports going 1-2 times daily and does, at times, require a stool softener.    Meds: pantoprazole , MVI, SSI novolog    Labs: Na+ 137 (wdl) K+ 4.1(wdl) CRP 19.6 (H) WBC 15.2>12.1>12.3 (H) CBGs 97-115 x24 hours A1c 7.3 (04/2024)  Diet Order:   Diet Order             Diet NPO time specified  Diet effective now            EDUCATION NEEDS:  Education needs have been addressed  Skin:  Skin Assessment: Reviewed RN Assessment  Last BM:  05/22/24  Height:  Ht Readings from Last 1 Encounters:  05/24/24 5' 5 (1.651 m)   Weight:  Wt Readings from Last 1 Encounters:  05/25/24 108 kg   Ideal Body Weight:  56.8 kg  BMI:  Body mass index is 39.62 kg/m.  Estimated Nutritional Needs:   Kcal:  1800-2000 kcals  Protein:  90-105g  Fluid:  1.8-2.0L/day  Blair Deaner MS, RD, LDN Registered  Dietitian Clinical Nutrition RD Inpatient Contact Info in Amion

## 2024-05-30 NOTE — Anesthesia Procedure Notes (Signed)
 Procedure Name: MAC Date/Time: 05/30/2024 3:38 PM  Performed by: Boyce Shilling, CRNAPre-anesthesia Checklist: Patient identified, Emergency Drugs available, Suction available, Timeout performed and Patient being monitored Patient Re-evaluated:Patient Re-evaluated prior to induction Oxygen Delivery Method: Nasal cannula Induction Type: IV induction Dental Injury: Teeth and Oropharynx as per pre-operative assessment

## 2024-05-30 NOTE — Progress Notes (Signed)
 Patient ID: RAGNA KRAMLICH, female   DOB: Oct 18, 1986, 38 y.o.   MRN: [id number]    Progress Note   Subjective  Day # 12 CC; severe acute idiopathic pancreatitis with initial diagnosis May 2025, complicated by large fluid collection/pseudocyst  Patient had EUS on 05/24/2024 with aspiration of 800 cc of fluid from the large complex multilobulated fluid collection Cytology negative  Pete CT abdomen and pelvis 05/28/2024-severe elevation of the left hemidiaphragm left upper quadrant incompletely imaged, small left pleural effusion, status postcholecystectomy, hepatomegaly, unchanged appearance of the pancreas with cystic lesion in the tail measuring 2.2 x 1.6 cm, similar fat stranding and fluid in the left upper quadrant.  Large complex serpiginous multiloculated fluid collection occupying the majority of the left upper quadrant about the spleen, gastric fundus and tip of the pancreas with the largest component under the diaphragm measuring 13.4 x 8.4 cm not significantly changed very slightly diminished volume of free and loculated fluid in the left upper quadrant  Labs today-sodium 137/potassium 4.1/BUN 13/creatinine 0.8/albumin 2.5 LFTs within normal limits CBC pending  Patient says she has been able to consume small amounts of solid food without vomiting over the past couple of days less nausea overall, does get full very quickly.  Still requiring IV narcotics for pain control.  She has been minimally mobile, up in the room.   Objective   Vital signs in last 24 hours: Temp:  [97.6 F (36.4 C)-98.4 F (36.9 C)] 97.6 F (36.4 C) (07/15 0850) Pulse Rate:  [63-88] 63 (07/15 0850) Resp:  [18] 18 (07/15 0850) BP: (143-166)/(81-99) 143/81 (07/15 0850) SpO2:  [95 %-97 %] 95 % (07/15 0850) Last BM Date : 05/29/24 General: Young white female in NAD Heart:  Regular rate and rhythm; no murmurs Lungs: Respirations even and unlabored, lungs CTA bilaterally Abdomen:  Soft, obese, tender across the upper  abdomen with significant fullness. Normal bowel sounds. Extremities:  Without edema. Neurologic:  Alert and oriented,  grossly normal neurologically. Psych:  Cooperative. Normal mood and affect.  Intake/Output from previous day: No intake/output data recorded. Intake/Output this shift: No intake/output data recorded.  Lab Results: Recent Labs    05/27/24 1438 05/29/24 0534  WBC 12.1* 12.3*  HGB 10.9* 10.8*  HCT 34.9* 34.9*  PLT 493* 524*   BMET Recent Labs    05/29/24 0534 05/30/24 0537  NA 137 137  K 4.1 4.1  CL 102 101  CO2 26 24  GLUCOSE 97 115*  BUN 9 13  CREATININE 0.70 0.80  CALCIUM 8.9 9.2   LFT Recent Labs    05/30/24 0537  PROT 6.3*  ALBUMIN 2.5*  AST 13*  ALT 11  ALKPHOS 69  BILITOT 0.3   PT/INR No results for input(s): LABPROT, INR in the last 72 hours.     Assessment / Plan:    #74 38 year old white female with acute severe idiopathic pancreatitis with initial diagnosis 2025.  This was complicated by large left pleural effusion requiring drainage, and now with very large multiloculated pseudocyst.  Status post EUS with aspiration of 800 cc of fluid on 05/24/2024-cytology negative  Repeat CT abdomen and pelvis on 05/28/2024 shows the fluid collections largely unchanged  She has made some progress with ability to take p.o.'s though only small amounts, and still requiring regular dosing of IV narcotics for pain control  Plan; patient is scheduled for repeat EUS and repeat cyst aspiration with Dr. Wilhelmenia this afternoon with hopes for further decompression to overall improve her pain and  ability to take p.o.'s. Procedure was discussed with the patient in detail today, including indications risks and benefits and she is agreeable to proceed.  Further recommendations post EUS/aspiration    Principal Problem:   Pancreatic abscess Active Problems:   Acute pancreatitis   Pleural effusion on left   Left upper quadrant pain   Nausea in  adult   Encounter for smoking cessation counseling   Acute pancreatic fluid collection   Malnutrition of moderate degree   Pancreatic pseudocyst   Gastritis without bleeding     LOS: 13 days   Leydy Worthey PA-C 05/30/2024, 11:23 AM

## 2024-05-30 NOTE — Interval H&P Note (Signed)
 History and Physical Interval Note:  05/30/2024 2:46 PM  Katrina Lambert  has presented today for surgery, with the diagnosis of large pancreatic pseudocysts.  The various methods of treatment have been discussed with the patient and family. After consideration of risks, benefits and other options for treatment, the patient has consented to  Procedure(s) with comments: EGD (ESOPHAGOGASTRODUODENOSCOPY) (N/A) ULTRASOUND, UPPER GI TRACT, ENDOSCOPIC (N/A) - and pancreatic pseudocyst aspiration as a surgical intervention.  The patient's history has been reviewed, patient examined, no change in status, stable for surgery.  I have reviewed the patient's chart and labs.  Questions were answered to the patient's satisfaction.     The risks of an EUS including intestinal perforation, bleeding, infection, aspiration, and medication effects were discussed as was the possibility it may not give a definitive diagnosis if a biopsy is performed.  When a biopsy of the pancreas is done as part of the EUS, there is an additional risk of pancreatitis at the rate of about 1-2%.  It was explained that procedure related pancreatitis is typically mild, although it can be severe and even life threatening, which is why we do not perform random pancreatic biopsies and only biopsy a lesion/area we feel is concerning enough to warrant the risk.    [name]

## 2024-05-30 NOTE — Op Note (Addendum)
 [facility] Patient Name: Katrina Lambert Procedure Date : 05/30/2024 MRN: [id number] Attending MD: Aloha Finner , MD, [id number] Date of Birth: 1986/11/15 CSN: [id number] Age: 38 Admit Type: Inpatient Procedure:                Upper EUS Indications:              Pancreatic cyst on CT scan, Acute recurrent                            pancreatitis, Abdominal pain in the left upper                            quadrant, For cyst aspiration Providers:                Aloha Finner, MD, Darleene Bare, RN, Fairy Marina, Technician Referring MD:              Medicines:                Monitored Anesthesia Care, Cipro  400 mg IV Complications:            No immediate complications. Estimated Blood Loss:     Estimated blood loss was minimal. Procedure:                Pre-Anesthesia Assessment:                           - Prior to the procedure, a History and Physical                            was performed, and patient medications and                            allergies were reviewed. The patient's tolerance of                            previous anesthesia was also reviewed. The risks                            and benefits of the procedure and the sedation                            options and risks were discussed with the patient.                            All questions were answered, and informed consent                            was obtained. Prior Anticoagulants: The patient has                            taken no anticoagulant or antiplatelet agents. ASA  Grade Assessment: III - A patient with severe                            systemic disease. After reviewing the risks and                            benefits, the patient was deemed in satisfactory                            condition to undergo the procedure.                           After obtaining informed consent, the endoscope was                             passed under direct vision. Throughout the                            procedure, the patient's blood pressure, pulse, and                            oxygen saturations were monitored continuously. The                            [id number]-[id number] ([id number]) Olympus endoscope was introduced                            through the mouth, and advanced to the second part                            of duodenum. The [id number] ([id number]) Olympus linear                            ultrasound scope was introduced through the mouth,                            and advanced to the duodenum for ultrasound                            examination from the stomach and duodenum. After                            obtaining informed consent, the endoscope was                            passed under direct vision. Throughout the                            procedure, the patient's blood pressure, pulse, and                            oxygen saturations were monitored continuously.The  upper EUS was accomplished without difficulty. The                            patient tolerated the procedure. Scope In: Scope Out: Findings:      ENDOSCOPIC FINDING: :      No gross lesions were noted in the entire esophagus.      The Z-line was irregular and was found 38 cm from the incisors.      Two areas of extrinsic compression on the stomach were found in the       cardia and in the gastric body.      Patchy mild inflammation characterized by erythema and granularity was       found in the entire examined stomach.      No gross lesions were noted in the duodenal bulb, in the first portion       of the duodenum and in the second portion of the duodenum.      ENDOSONOGRAPHIC FINDING: :      An anechoic and shadowing lesion suggestive of a cyst was identified in       the peripancreatic region mostly subcapsular of the spleen and then       going inferiorly into the splenic region. It is not in obvious        communication with the pancreatic duct. The lesion measured 122 mm by 60       mm in maximal cross-sectional diameter. There was a single compartment       without septae. The outer wall of the lesion was thin. There was no       associated mass. There was internal debris within the fluid-filled       cavity. Diagnostic and therapeutic needle aspiration for fluid was       performed. Color Doppler imaging was utilized prior to needle puncture       to confirm a lack of significant vascular structures within the needle       path. One pass was made with the 19 gauge needle using a transgastric       approach. A stylet was used. The amount of fluid collected was 450 mL.       The fluid was cloudy, turbid, brown and watery. Sample(s) were sent for       bacterial cultures. What is leftover now is mostly debriding within a       decompressed cystic portion of the mostly inferior spleen.      Pancreatic parenchymal abnormalities were noted in the entire pancreas.       These consisted of diffusely increased echogenicity and lobularity.      Endosonographic imaging in the visualized portion of the liver showed no       mass.      The celiac region was visualized. Impression:               EGD impression:                           - No gross lesions in the entire esophagus. Z-line                            irregular, 38 cm from the incisors.                           -  Extrinsic areas of impression in the cardia and                            in the gastric body (this looks like only 2 areas                            rather than 3 areas previously).                           - Gastritis.                           - No gross lesions in the duodenal bulb, in the                            first portion of the duodenum and in the second                            portion of the duodenum.                           EUS impression:                           - A cystic lesion was seen in the  peripancreatic                            region (mostly perisplenic with large component                            noted inferiorly in the spleen). Tissue has not                            been obtained. However, the endosonographic                            appearance is consistent with a pancreatic                            pseudocyst. Fine needle aspiration for fluid                            performed for total of 450 cc. What is left upper                            it is now what appears to be walled off necrosis in                            the inferior aspect of the spleen.                           - Pancreatic parenchymal abnormalities consisting  of diffusely increased echogenicity and lobularity                            were noted in the entire pancreas. Recommendation:           - The patient will be observed post-procedure,                            until all discharge criteria are met.                           - Return patient to hospital ward for ongoing care.                           - Full liquid diet.                           - Observe patient's clinical course.                           - Ciprofloxacin  500 mg twice daily for next 3 days.                           - Will order CT abdomen to be completed this                            evening or tomorrow morning. This will help us                             understand how much of the cystic components still                            remain.                           - If patient is doing well, patient will be able to                            hopefully advance her diet and continue to do well                            and have repeat imaging in 1 week. If she worsens,                            would recommend repeat CT abdomen with IV and oral                            contrast in effort of knowing whether the cyst is                            reaccumulating or worsening from  the CT imaging                            from  tonight/tomorrow. This would help us  better                            understand the risk of pancreatic duct disruption.                           - The findings and recommendations were discussed                            with the patient.                           - The findings and recommendations were discussed                            with the referring physician. Procedure Code(s):        --- Professional ---                           [phone], Esophagogastroduodenoscopy, flexible,                            transoral; with transendoscopic ultrasound-guided                            intramural or transmural fine needle                            aspiration/biopsy(s), (includes endoscopic                            ultrasound examination limited to the esophagus,                            stomach or duodenum, and adjacent structures) Diagnosis Code(s):        --- Professional ---                           K22.89, Other specified disease of esophagus                           K31.89, Other diseases of stomach and duodenum                           K29.70, Gastritis, unspecified, without bleeding                           K86.2, Cyst of pancreas                           K86.9, Disease of pancreas, unspecified                           K85.90, Acute pancreatitis without necrosis or                            infection, unspecified  R10.12, Left upper quadrant pain CPT copyright 2022 [employer]. All rights reserved. The codes documented in this report are preliminary and upon coder review may  be revised to meet current compliance requirements. Aloha Finner, MD 05/30/2024 5:16:34 PM Number of Addenda: 0

## 2024-05-30 NOTE — Plan of Care (Signed)
  Problem: Health Behavior/Discharge Planning: Goal: Ability to manage health-related needs will improve 05/30/2024 1739 by Mariano Rosina LABOR, RN Outcome: Progressing 05/30/2024 1738 by Mariano Rosina LABOR, RN Outcome: Progressing   Problem: Clinical Measurements: Goal: Ability to maintain clinical measurements within normal limits will improve 05/30/2024 1739 by Mariano Rosina LABOR, RN Outcome: Progressing 05/30/2024 1738 by Mariano Rosina LABOR, RN Outcome: Progressing Goal: Will remain free from infection 05/30/2024 1739 by Mariano Rosina LABOR, RN Outcome: Progressing 05/30/2024 1738 by Mariano Rosina LABOR, RN Outcome: Progressing Goal: Diagnostic test results will improve Outcome: Progressing Goal: Respiratory complications will improve 05/30/2024 1739 by Mariano Rosina LABOR, RN Outcome: Progressing 05/30/2024 1738 by Mariano Rosina LABOR, RN Outcome: Progressing Goal: Cardiovascular complication will be avoided Outcome: Progressing   Problem: Nutrition: Goal: Adequate nutrition will be maintained 05/30/2024 1739 by Mariano Rosina LABOR, RN Outcome: Not Progressing 05/30/2024 1738 by Mariano Rosina LABOR, RN Outcome: Progressing   Problem: Clinical Measurements: Goal: Ability to maintain clinical measurements within normal limits will improve 05/30/2024 1739 by Mariano Rosina LABOR, RN Outcome: Progressing 05/30/2024 1738 by Mariano Rosina LABOR, RN Outcome: Progressing Goal: Will remain free from infection 05/30/2024 1739 by Mariano Rosina LABOR, RN Outcome: Progressing 05/30/2024 1738 by Mariano Rosina LABOR, RN Outcome: Progressing Goal: Diagnostic test results will improve Outcome: Progressing Goal: Respiratory complications will improve 05/30/2024 1739 by Mariano Rosina LABOR, RN Outcome: Progressing 05/30/2024 1738 by Mariano Rosina LABOR, RN Outcome: Progressing Goal: Cardiovascular complication will be avoided Outcome:  Progressing   Problem: Nutrition: Goal: Adequate nutrition will be maintained 05/30/2024 1739 by Mariano Rosina LABOR, RN Outcome: Not Progressing 05/30/2024 1738 by Mariano Rosina LABOR, RN Outcome: Progressing   Problem: Elimination: Goal: Will not experience complications related to bowel motility 05/30/2024 1739 by Mariano Rosina LABOR, RN Outcome: Progressing 05/30/2024 1738 by Mariano Rosina LABOR, RN Outcome: Progressing Goal: Will not experience complications related to urinary retention Outcome: Progressing  NPO all day for EUS. Trying full liquids now. IV pain meds required

## 2024-05-30 NOTE — Anesthesia Preprocedure Evaluation (Signed)
 Anesthesia Evaluation  Patient identified by MRN, date of birth, ID band Patient awake    Reviewed: Allergy & Precautions, NPO status , Patient's Chart, lab work & pertinent test results, reviewed documented beta blocker date and time   History of Anesthesia Complications (+) PONV and history of anesthetic complications  Airway Mallampati: II  TM Distance: >3 FB     Dental  (+) Missing, Poor Dentition   Pulmonary shortness of breath and with exertion, asthma , pneumonia, Current Smoker and Patient abstained from smoking.   breath sounds clear to auscultation       Cardiovascular hypertension, (-) angina (-) CAD, (-) Past MI and (-) Cardiac Stents  Rhythm:Regular Rate:Normal     Neuro/Psych  Headaches, neg Seizures PSYCHIATRIC DISORDERS Anxiety Depression Bipolar Disorder    Neuromuscular disease    GI/Hepatic ,GERD  ,,Prior severe pancreatitis   Endo/Other    Renal/GU      Musculoskeletal  (+) Arthritis ,    Abdominal   Peds  Hematology   Anesthesia Other Findings   Reproductive/Obstetrics                              Anesthesia Physical Anesthesia Plan  ASA: 3  Anesthesia Plan: MAC   Post-op Pain Management:    Induction: Intravenous  PONV Risk Score and Plan: 2 and Ondansetron  and Propofol  infusion  Airway Management Planned: Natural Airway and Nasal Cannula  Additional Equipment:   Intra-op Plan:   Post-operative Plan:   Informed Consent: I have reviewed the patients History and Physical, chart, labs and discussed the procedure including the risks, benefits and alternatives for the proposed anesthesia with the patient or authorized representative who has indicated his/her understanding and acceptance.     Dental advisory given  Plan Discussed with: CRNA  Anesthesia Plan Comments:          Anesthesia Quick Evaluation

## 2024-05-30 NOTE — TOC Progression Note (Signed)
 Transition of Care [facility]) - Progression Note    Patient Details  Name: Katrina Lambert MRN: [id number] Date of Birth: October 17, 1986  Transition of Care [facility]) CM/SW Contact  Tom-Johnson, Genessa Beman Daphne, RN Phone Number: 05/30/2024, 4:35 PM  Clinical Narrative:     Financial assistance resources printed from [email or web address] and given to patient at bedside.   CM will continue to follow.          Expected Discharge Plan and Services                                               Social Determinants of Health (SDOH) Interventions SDOH Screenings   Food Insecurity: No Food Insecurity (05/18/2024)  Housing: Low Risk  (05/18/2024)  Transportation Needs: No Transportation Needs (05/18/2024)  Recent Concern: Transportation Needs - Unmet Transportation Needs (03/18/2024)   Received from [facility]  Utilities: Not At Risk (05/18/2024)  Recent Concern: Utilities - High Risk (03/20/2024)   Received from [facility]  Financial Resource Strain: Low Risk  (03/18/2024)   Received from [facility]  Physical Activity: Inactive (03/18/2024)   Received from [facility]  Social Connections: Socially Isolated (03/18/2024)   Received from [facility]  Stress: Stress Concern Present (03/18/2024)   Received from [facility]  Tobacco Use: High Risk (05/30/2024)  Health Literacy: Low Risk  (03/18/2024)   Received from [facility]    Readmission Risk Interventions    05/18/2024    2:37 PM  Readmission Risk Prevention Plan  Transportation Screening Complete  PCP or Specialist Appt within 5-7 Days Complete  Home Care Screening Complete  Medication Review (RN CM) Referral to Pharmacy

## 2024-05-30 NOTE — Hospital Course (Addendum)
 Brief Narrative:  38 y.o. female with PMH of DM-2, HTN, asthma, IDA, morbid obesity, B12 deficiency, cervical myelopathy, tobacco use disorder and recent hospitalization at [facility] from 5/3-5/6 for idiopathic pancreatitis and at [facility] 5/31/611 for recurrent pancreatitis with pseudocyst as well as hospital-acquired pneumonia complicated by parapneumonic effusion requiring chest tube placement returning to ED with worsening intermittent abdominal pain, nausea and vomiting since her discharge from the hospital. Being managed for abdominal pain in the setting of pancreatic pseudocyst, s/p percutaneous cyst drainage. Repeat CT scan shows likely destructive pancreatic duct and some worsening of fluid collection. Started octreotide  by GI and on IV Roc for Strep Mitis in aspirated fluid from pancrease.  GI recommending to transfer higher level of care.  Patient has been accepted at [facility]   Assessment & Plan:  Principal Problem:   Pancreatic abscess Active Problems:   Acute pancreatitis   Pleural effusion on left   Left upper quadrant pain   Nausea in adult   Encounter for smoking cessation counseling   Acute pancreatic fluid collection   Malnutrition of moderate degree   Pancreatic pseudocyst   Gastritis without bleeding    Acute on chronic pancreatitis with pancreatic pseudocyst -7/3 S/p CT-guided diagnostic aspiration  7/9  S/p EGD with EUS assisted drainage.  Drained 800 mL cyst.    7/15 Repeat EUS 7/15, drained 450cc Continue analgesics as needed for pain.  Despite of multilple decompression she continues to increase in recollection of her pancreatic fluid suggesting Pancreatic duct disruption.  Repeat CT scan shows likely destructive pancreatic duct and some worsening of fluid collection.  Currently on octreotide  per GI 100 mcg every 12 hours. Awaiting transfer to [facility], excepted by hospitalist team Dr Wallis Commodore  -Most recent aspiration is growing Streptococcus mitis/oralis, started  Rocephin .  Alternatively we can use penicillin infusion but for the ease of transfer will continue Rocephin     Type II diabetes mellitus - A1c 7.3. NovoLog  as needed per sliding scale.   Hypokalemia/hypomagnesemia Improved.  Replete as needed   Comorbidities including hypertension, tobacco use disorder on nicotine  patch, obesity    Nutrition Problem: Moderate Malnutrition Etiology: acute illness   Signs/Symptoms: percent weight loss, mild muscle depletion Percent weight loss: 7.7 %     Body mass index is 39.62 kg/m.  (Class II obesity)     DVT prophylaxis: SCDs Start: 05/17/24 2001    Code Status: Full Code Family Communication:   Status is: Inpatient  transfer to [facility]  Subjective: Doing ok. Still off and on having abd pain.   Examination:  General exam: Appears calm and comfortable  Respiratory system: Clear to auscultation. Respiratory effort normal. Cardiovascular system: S1 & S2 heard, RRR. No JVD, murmurs, rubs, gallops or clicks. No pedal edema. Gastrointestinal system: Abdomen is nondistended, soft and nontender. No organomegaly or masses felt. Normal bowel sounds heard. Central nervous system: Alert and oriented. No focal neurological deficits. Extremities: Symmetric 5 x 5 power. Skin: No rashes, lesions or ulcers Psychiatry: Judgement and insight appear normal. Mood & affect appropriate.

## 2024-05-30 NOTE — Progress Notes (Signed)
 PROGRESS NOTE    Katrina Lambert  FMW:[id number] DOB: 10-Oct-1986 DOA: 05/17/2024 PCP: [facility]    Brief Narrative:  38 y.o. female with PMH of DM-2, HTN, asthma, IDA, morbid obesity, B12 deficiency, cervical myelopathy, tobacco use disorder and recent hospitalization at [facility] from 5/3-5/6 for idiopathic pancreatitis and at [facility] 5/31/611 for recurrent pancreatitis with pseudocyst as well as hospital-acquired pneumonia complicated by parapneumonic effusion requiring chest tube placement returning to ED with worsening intermittent abdominal pain, nausea and vomiting since her discharge from the hospital. Being managed for abdominal pain in the setting of pancreatic pseudocyst, s/p percutaneous cyst drainage.    Assessment & Plan:  Principal Problem:   Pancreatic abscess Active Problems:   Acute pancreatitis   Pleural effusion on left   Left upper quadrant pain   Nausea in adult   Encounter for smoking cessation counseling   Acute pancreatic fluid collection   Malnutrition of moderate degree   Pancreatic pseudocyst   Gastritis without bleeding    Acute on chronic pancreatitis with pancreatic pseudocyst - S/p CT-guided diagnostic aspiration 7/3.  S/p EGD with EUS assisted drainage 7/9.  Drained 800 mL cyst.   Continue analgesics as needed for pain. Repeat CT shows essentially unchanged cyst (meaning fluid has re-accumulated).  Repeat EUS per GI     SIRS with lactic acidosis, tachycardia and leukocytosis.  Leukocytosis resolved.   -Sepsis ruled out. Continue supportive care   Type II diabetes mellitus - A1c 7.3. NovoLog  as needed per sliding scale.   Hypokalemia/hypomagnesemia Improved.  Replete as needed   Comorbidities including hypertension, tobacco use disorder on nicotine  patch, obesity    Nutrition Problem: Moderate Malnutrition Etiology: acute illness   Signs/Symptoms: percent weight loss, mild muscle depletion Percent weight loss: 7.7 %     Body mass  index is 39.62 kg/m.  (Class II obesity)     DVT prophylaxis: SCDs Start: 05/17/24 2001    Code Status: Full Code Family Communication:   Status is: Inpatient Continue hospital stay until cleared by gastroenterology  Subjective:  Seen at bedside, tolerated p.o. intake yesterday.  Denying any chest pain and nausea this morning  Examination:  General exam: Appears calm and comfortable  Respiratory system: Clear to auscultation. Respiratory effort normal. Cardiovascular system: S1 & S2 heard, RRR. No JVD, murmurs, rubs, gallops or clicks. No pedal edema. Gastrointestinal system: Abdomen is nondistended, soft and nontender. No organomegaly or masses felt. Normal bowel sounds heard. Central nervous system: Alert and oriented. No focal neurological deficits. Extremities: Symmetric 5 x 5 power. Skin: No rashes, lesions or ulcers Psychiatry: Judgement and insight appear normal. Mood & affect appropriate.                Diet Orders (From admission, onward)     Start     Ordered   05/31/24 0001  Diet NPO time specified Except for: Sips with Meds  Diet effective midnight       Question:  Except for  Answer:  Sips with Meds   05/30/24 0953   05/30/24 0646  Diet NPO time specified  Diet effective now        05/30/24 0646            Objective: Vitals:   05/29/24 1631 05/29/24 2029 05/30/24 0531 05/30/24 0850  BP: (!) 154/96 (!) 153/98 (!) 166/99 (!) 143/81  Pulse: 87 88 65 63  Resp: 18 18 18 18   Temp: 98 F (36.7 C) 98.4 F (36.9 C) 98  F (36.7 C) 97.6 F (36.4 C)  TempSrc: Oral  Oral Oral  SpO2: 97% 97% 95% 95%  Weight:      Height:        Intake/Output Summary (Last 24 hours) at 05/30/2024 1139 Last data filed at 05/30/2024 0600 Gross per 24 hour  Intake --  Output 0 ml  Net 0 ml   Filed Weights   05/18/24 0800 05/24/24 1212 05/25/24 0800  Weight: 108.9 kg 108.9 kg 108 kg    Scheduled Meds:  feeding supplement  237 mL Oral BID BM   insulin  aspart   0-9 Units Subcutaneous TID WC   multivitamin with minerals  1 tablet Oral Daily   nicotine   14 mg Transdermal Daily   pantoprazole   40 mg Oral Q1200   sodium chloride  flush  3 mL Intravenous Q12H   Continuous Infusions:  Nutritional status Signs/Symptoms: percent weight loss, mild muscle depletion Percent weight loss: 7.7 % Interventions: Ensure Enlive (each supplement provides 350kcal and 20 grams of protein), MVI, Magic cup, Liberalize Diet Body mass index is 39.62 kg/m.  Data Reviewed:   CBC: Recent Labs  Lab 05/24/24 0434 05/25/24 0547 05/26/24 0536 05/27/24 1438 05/29/24 0534  WBC  --  11.6* 15.2* 12.1* 12.3*  HGB 10.3* 10.3* 10.3* 10.9* 10.8*  HCT 32.6* 32.7* 33.2* 34.9* 34.9*  MCV  --  80.1 81.2 80.0 79.7*  PLT  --  530* 505* 493* 524*   Basic Metabolic Panel: Recent Labs  Lab 05/25/24 0547 05/26/24 0536 05/29/24 0534 05/30/24 0537  NA 136 139 137 137  K 4.0 3.6 4.1 4.1  CL 101 104 102 101  CO2 24 24 26 24   GLUCOSE 117* 109* 97 115*  BUN 6 11 9 13   CREATININE 0.73 0.72 0.70 0.80  CALCIUM 9.1 8.6* 8.9 9.2  MG 1.7 1.8  --   --    GFR: Estimated Creatinine Clearance: 117.6 mL/min (by C-G formula based on SCr of 0.8 mg/dL). Liver Function Tests: Recent Labs  Lab 05/26/24 0536 05/30/24 0537  AST 12* 13*  ALT 10 11  ALKPHOS 57 69  BILITOT 0.3 0.3  PROT 5.7* 6.3*  ALBUMIN 2.4* 2.5*   No results for input(s): LIPASE, AMYLASE in the last 168 hours. No results for input(s): AMMONIA in the last 168 hours. Coagulation Profile: No results for input(s): INR, PROTIME in the last 168 hours. Cardiac Enzymes: No results for input(s): CKTOTAL, CKMB, CKMBINDEX, TROPONINI in the last 168 hours. BNP (last 3 results) No results for input(s): PROBNP in the last 8760 hours. HbA1C: No results for input(s): HGBA1C in the last 72 hours. CBG: Recent Labs  Lab 05/29/24 0758 05/29/24 1125 05/29/24 1631 05/29/24 2029 05/30/24 0736  GLUCAP  154* 110* 110* 114* 144*   Lipid Profile: No results for input(s): CHOL, HDL, LDLCALC, TRIG, CHOLHDL, LDLDIRECT in the last 72 hours. Thyroid Function Tests: No results for input(s): TSH, T4TOTAL, FREET4, T3FREE, THYROIDAB in the last 72 hours. Anemia Panel: No results for input(s): VITAMINB12, FOLATE, FERRITIN, TIBC, IRON , RETICCTPCT in the last 72 hours. Sepsis Labs: No results for input(s): PROCALCITON, LATICACIDVEN in the last 168 hours.  Recent Results (from the past 240 hours)  Aerobic/Anaerobic Culture w Gram Stain (surgical/deep wound)     Status: None   Collection Time: 05/24/24  1:51 PM   Specimen: PATH Cytology FNA; Body Fluid  Result Value Ref Range Status   Specimen Description FLUID  Final   Special Requests PANCREATIC FLUID  Final  Gram Stain NO WBC SEEN NO ORGANISMS SEEN   Final   Culture   Final    No growth aerobically or anaerobically. Performed at [facility], [address] [place], [place] [address]    Report Status 05/29/2024 FINAL  Final         Radiology Studies: No results found.         LOS: 13 days   Time spent= 35 mins    Burgess JAYSON Dare, MD [facility]  If 7PM-7AM, please contact night-coverage  05/30/2024, 11:39 AM

## 2024-05-31 DIAGNOSIS — K859 Acute pancreatitis without necrosis or infection, unspecified: Secondary | ICD-10-CM | POA: Diagnosis not present

## 2024-05-31 LAB — COMPREHENSIVE METABOLIC PANEL WITH GFR
ALT: 11 U/L (ref 0–44)
AST: 17 U/L (ref 15–41)
Albumin: 2.4 g/dL — ABNORMAL LOW (ref 3.5–5.0)
Alkaline Phosphatase: 68 U/L (ref 38–126)
Anion gap: 15 (ref 5–15)
BUN: 9 mg/dL (ref 6–20)
CO2: 21 mmol/L — ABNORMAL LOW (ref 22–32)
Calcium: 8.9 mg/dL (ref 8.9–10.3)
Chloride: 101 mmol/L (ref 98–111)
Creatinine, Ser: 0.75 mg/dL (ref 0.44–1.00)
GFR, Estimated: >60 mL/min (ref >60)
Glucose, Bld: 107 mg/dL — ABNORMAL HIGH (ref 70–99)
Potassium: 4.5 mmol/L (ref 3.5–5.1)
Sodium: 137 mmol/L (ref 135–145)
Total Bilirubin: 0.4 mg/dL (ref 0.0–1.2)
Total Protein: 6 g/dL — ABNORMAL LOW (ref 6.5–8.1)

## 2024-05-31 LAB — CBC
HCT: 33.2 % — ABNORMAL LOW (ref 36.0–46.0)
Hemoglobin: 10.3 g/dL — ABNORMAL LOW (ref 12.0–15.0)
MCH: 24.7 pg — ABNORMAL LOW (ref 26.0–34.0)
MCHC: 31 g/dL (ref 30.0–36.0)
MCV: 79.6 fL — ABNORMAL LOW (ref 80.0–100.0)
Platelets: 503 K/uL — ABNORMAL HIGH (ref 150–400)
RBC: 4.17 MIL/uL (ref 3.87–5.11)
RDW: 14.6 % (ref 11.5–15.5)
WBC: 10.8 K/uL — ABNORMAL HIGH (ref 4.0–10.5)
nRBC: 0 % (ref 0.0–0.2)

## 2024-05-31 LAB — GLUCOSE, CAPILLARY
Glucose-Capillary: 98 mg/dL (ref 70–99)
Glucose-Capillary: 142 mg/dL — ABNORMAL HIGH (ref 70–99)
Glucose-Capillary: 131 mg/dL — ABNORMAL HIGH (ref 70–99)
Glucose-Capillary: 128 mg/dL — ABNORMAL HIGH (ref 70–99)

## 2024-05-31 LAB — MAGNESIUM: Magnesium: 1.7 mg/dL (ref 1.7–2.4)

## 2024-05-31 MED ORDER — MAGNESIUM OXIDE -MG SUPPLEMENT 400 (240 MG) MG PO TABS
800.0000 mg | ORAL_TABLET | Freq: Once | ORAL | Status: AC
  Administered 2024-05-31: 800 mg via ORAL
  Filled 2024-05-31: qty 2

## 2024-05-31 NOTE — Progress Notes (Signed)
 Patient ID: Katrina Lambert, female   DOB: 09-27-1986, 38 y.o.   MRN: [id number]    Progress Note   Subjective   Day # 14 CC; severe acute pancreatitis with large pancreatic pseudocysts  Upper EUS yesterday-2 areas of extrinsic compression on the stomach noted in the cardia and the gastric body, no duodenal pathology, cyst identified in the peripancreatic region, mostly subcapsular to the spleen and going inferiorly into the splenic region, not in obvious communication with the pancreatic duct.  Lesion 122 mm x 60 mm, single compartment without septae, internal debris within the fluid-filled cavity.  This was aspirated via transgastric approach with 450 mL of cloudy turbid brown watery fluid removed Fluid sent for sampling  Repeat CT the abdomen and pelvis last p.m.-small pseudocyst noted in the tail of the pancreas, the previously seen extensive multiloculated fluid collection in the left upper quadrant has nearly completely resolved following cyst drainage, there remains some inflammatory change and collapsed cyst walls adjacent to the stomach, there is a small collection posterior to the spleen beneath the diaphragm significantly decreased in size.,  There is a small extraluminal collection lateral to the splenic flexure measuring 3.3 x 2.2 cm, stomach decompressed  Labs today WBC 10.8/hemoglobin 10.3/hematocrit 33.2 stable LFTs normal  Patient says she does feel significantly better since the procedure yesterday she is tolerating liquids and thinks she can tolerate solid food no nausea or vomiting and no abdominal pain postprocedure yesterday   Objective   Vital signs in last 24 hours: Temp:  [98 F (36.7 C)-98.3 F (36.8 C)] 98 F (36.7 C) (07/16 0849) Pulse Rate:  [68-90] 79 (07/16 0849) Resp:  [0-25] 18 (07/16 0849) BP: (111-144)/(71-92) 137/86 (07/16 0849) SpO2:  [92 %-100 %] 95 % (07/16 0849) Weight:  [891 kg] 108 kg (07/15 1427) Last BM Date : 05/29/24 General: Young   white  female in NAD Heart:  Regular rate and rhythm; no murmurs Lungs: Respirations even and unlabored, lungs CTA bilaterally Abdomen:  Soft, obese , remains tender but definitely improved, no guarding or rebound normal bowel sounds. Extremities:  Without edema. Neurologic:  Alert and oriented,  grossly normal neurologically. Psych:  Cooperative. Normal mood and affect.  Intake/Output from previous day: 07/15 0701 - 07/16 0700 In: 553 [P.O.:50; I.V.:303; IV Piggyback:200] Out: 0  Intake/Output this shift: Total I/O In: 240 [P.O.:240] Out: -   Lab Results: Recent Labs    05/29/24 0534 05/31/24 0450  WBC 12.3* 10.8*  HGB 10.8* 10.3*  HCT 34.9* 33.2*  PLT 524* 503*   BMET Recent Labs    05/29/24 0534 05/30/24 0537 05/31/24 0450  NA 137 137 137  K 4.1 4.1 4.5  CL 102 101 101  CO2 26 24 21*  GLUCOSE 97 115* 107*  BUN 9 13 9   CREATININE 0.70 0.80 0.75  CALCIUM 8.9 9.2 8.9   LFT Recent Labs    05/31/24 0450  PROT 6.0*  ALBUMIN 2.4*  AST 17  ALT 11  ALKPHOS 68  BILITOT 0.4   PT/INR No results for input(s): LABPROT, INR in the last 72 hours.  Studies/Results: CT ABDOMEN W CONTRAST Result Date: 05/30/2024 CLINICAL DATA:  Left upper quadrant pain, history of pancreatic pseudocyst and recent endoscopic transgastric drainage. EXAM: CT ABDOMEN WITH CONTRAST TECHNIQUE: Multidetector CT imaging of the abdomen was performed using the standard protocol following bolus administration of intravenous contrast. RADIATION DOSE REDUCTION: This exam was performed according to the departmental dose-optimization program which includes automated exposure control, adjustment  of the mA and/or kV according to patient size and/or use of iterative reconstruction technique. CONTRAST:  75mL OMNIPAQUE  IOHEXOL  350 MG/ML SOLN COMPARISON:  05/28/2024 FINDINGS: Lower chest: Right lung base is within normal limits. Left lung base shows small pleural effusion and left lower lobe consolidation similar  to that seen on the prior exam. Hepatobiliary: No focal liver abnormality is seen. Status post cholecystectomy. No biliary dilatation. Pancreas: Pancreas is again well visualized. Small pseudocyst is noted within the tail of the pancreas. The previously seen extensive multiloculated fluid collection in the left upper quadrant has nearly completely resolved following cyst drainage VA transgastric approach. There remains some inflammatory change and collapsed cyst walls adjacent to the stomach. A small curvilinear collection is noted posterior to the spleen beneath the hemidiaphragm but significantly decreased now measuring approximately 13 mm in thickness decreased from 40 mm in thickness. No other sizable component of the previously seen collection is noted. A small extraluminal collection is noted best seen on image number 37 of series 5 lateral to the splenic flexure measuring approximately 3.3 x 2.2 cm. This is stable in appearance from the prior exam and likely represents a separate cyst. Spleen: Spleen is within normal limits. Adrenals/Urinary Tract: Adrenal glands are within normal limits. Kidneys demonstrate a normal enhancement pattern. Stomach/Bowel: Stomach is decompressed. The degree of mass effect upon the stomach has resolved following cyst drainage. Visualized large and small bowel is within normal limits. Vascular/Lymphatic: Aortic atherosclerosis. No enlarged abdominal or pelvic lymph nodes. Other: None Musculoskeletal: No acute or significant osseous findings. IMPRESSION: There is been significant decompression of the multiloculated cystic structure in the left upper quadrant following transgastric cyst drainage. There remains a E tiny component interposed between the stomach and spleen within multiple collapsed walls as well as a small curvilinear collection posterior to the spleen which is significantly reduced in size. A separate small cystic lesion is noted lateral to the splenic flexure as  described. This is stable from the prior exam. Persistent pseudocyst in the pancreatic tail. No other focal abnormality is noted. Electronically Signed   By: Oneil Devonshire M.D.   On: 05/30/2024 23:10       Assessment / Plan:     #21 38 year old white female with severe acute idiopathic pancreatitis inpatient status post previous cholecystectomy with development of large complicated pseudocysts.  EUS 05/24/2024 with aspiration of 800 cc of fluid-cytology negative  Unfortunately repeat CT on 05/28/2024 showed no significant change in the size of the pseudocyst which was quite large at 13.4 x 8.4 cm occupying the majority of the left upper quadrant and perisplenic area.  Repeat EUS yesterday with transgastric aspiration of 450 cc of fluid Repeat CT last night showed almost complete resolution of the pseudocyst with collapse against the gastric wall  Patient feels much better and feels that she could tolerate solid food  Plan; advance to regular diet, cautioned her to start with small portions Pain control as needed hopefully she can be weaned off of IV narcotics  See how she does over the next 24 hours, plan was to repeat a CT in 48 hours or so to see if she has a reaccumulation of the pseudocyst and if so then there is high concern for pancreatic duct disruption which will require further procedures with pancreatic ERCP.  GI will continue to follow with you    Principal Problem:   Pancreatic abscess Active Problems:   Acute pancreatitis   Pleural effusion on left   Left upper  quadrant pain   Nausea in adult   Encounter for smoking cessation counseling   Acute pancreatic fluid collection   Malnutrition of moderate degree   Pancreatic pseudocyst   Gastritis without bleeding   Acquired splenic cyst   Generalized abdominal pain   Pseudocyst of pancreas   Peripancreatic fluid collection   LUQ pain     LOS: 14 days   Mallika Sanmiguel PA-C 05/31/2024, 11:17 AM

## 2024-05-31 NOTE — Plan of Care (Signed)
  Problem: Health Behavior/Discharge Planning: Goal: Ability to manage health-related needs will improve Outcome: Progressing   Problem: Clinical Measurements: Goal: Ability to maintain clinical measurements within normal limits will improve Outcome: Progressing Goal: Will remain free from infection Outcome: Progressing Goal: Diagnostic test results will improve Outcome: Progressing Goal: Respiratory complications will improve Outcome: Progressing Goal: Cardiovascular complication will be avoided Outcome: Progressing   Problem: Nutrition: Goal: Adequate nutrition will be maintained Outcome: Progressing   Problem: Coping: Goal: Level of anxiety will decrease Outcome: Progressing   Problem: Elimination: Goal: Will not experience complications related to bowel motility Outcome: Progressing Goal: Will not experience complications related to urinary retention Outcome: Progressing   Problem: Pain Managment: Goal: General experience of comfort will improve and/or be controlled Outcome: Progressing   Problem: Safety: Goal: Ability to remain free from injury will improve Outcome: Progressing   Problem: Skin Integrity: Goal: Risk for impaired skin integrity will decrease Outcome: Progressing   Problem: Education: Goal: Ability to describe self-care measures that may prevent or decrease complications (Diabetes Survival Skills Education) will improve Outcome: Progressing Goal: Individualized Educational Video(s) Outcome: Progressing   Problem: Coping: Goal: Ability to adjust to condition or change in health will improve Outcome: Progressing   Problem: Fluid Volume: Goal: Ability to maintain a balanced intake and output will improve Outcome: Progressing   Problem: Health Behavior/Discharge Planning: Goal: Ability to identify and utilize available resources and services will improve Outcome: Progressing Goal: Ability to manage health-related needs will improve Outcome:  Progressing   Problem: Metabolic: Goal: Ability to maintain appropriate glucose levels will improve Outcome: Progressing   Problem: Nutritional: Goal: Maintenance of adequate nutrition will improve Outcome: Progressing Goal: Progress toward achieving an optimal weight will improve Outcome: Progressing   Problem: Skin Integrity: Goal: Risk for impaired skin integrity will decrease Outcome: Progressing   Problem: Tissue Perfusion: Goal: Adequacy of tissue perfusion will improve Outcome: Progressing

## 2024-05-31 NOTE — Progress Notes (Signed)
 PROGRESS NOTE    Katrina Lambert  FMW:[id number] DOB: [birth year] DOA: 05/17/2024 PCP: [facility]    Brief Narrative:  38 y.o. female with PMH of DM-2, HTN, asthma, IDA, morbid obesity, B12 deficiency, cervical myelopathy, tobacco use disorder and recent hospitalization at [facility] from 5/3-5/6 for idiopathic pancreatitis and at [facility] 5/31/611 for recurrent pancreatitis with pseudocyst as well as hospital-acquired pneumonia complicated by parapneumonic effusion requiring chest tube placement returning to ED with worsening intermittent abdominal pain, nausea and vomiting since her discharge from the hospital. Being managed for abdominal pain in the setting of pancreatic pseudocyst, s/p percutaneous cyst drainage.    Assessment & Plan:  Principal Problem:   Pancreatic abscess Active Problems:   Acute pancreatitis   Pleural effusion on left   Left upper quadrant pain   Nausea in adult   Encounter for smoking cessation counseling   Acute pancreatic fluid collection   Malnutrition of moderate degree   Pancreatic pseudocyst   Gastritis without bleeding    Acute on chronic pancreatitis with pancreatic pseudocyst - S/p CT-guided diagnostic aspiration 7/3.  S/p EGD with EUS assisted drainage 7/9.  Drained 800 mL cyst.   Continue analgesics as needed for pain. Repeat CT shows essentially unchanged cyst (meaning fluid has re-accumulated).  Repeat EUS showed cystic lesion in the peripancreatic region consistent with pseudocyst, fine-needle aspiration retrieved 450 cc.  Does have some likely walled off necrosis.  Repeat CT from 7/15 shows decompression after aspiration - Plan is to clinically observe, advance diet as tolerated, Cipro  twice daily for next 3 days.  Repeat CT scan in 1 week, sooner if necessary.     SIRS with lactic acidosis, tachycardia and leukocytosis.  Leukocytosis resolved.   -Sepsis ruled out. Continue supportive care   Type II diabetes mellitus - A1c 7.3. NovoLog  as  needed per sliding scale.   Hypokalemia/hypomagnesemia Improved.  Replete as needed   Comorbidities including hypertension, tobacco use disorder on nicotine  patch, obesity    Nutrition Problem: Moderate Malnutrition Etiology: acute illness   Signs/Symptoms: percent weight loss, mild muscle depletion Percent weight loss: 7.7 %     Body mass index is 39.62 kg/m.  (Class II obesity)     DVT prophylaxis: SCDs Start: 05/17/24 2001    Code Status: Full Code Family Communication:   Status is: Inpatient Continue hospital stay until cleared by gastroenterology  Subjective: Tolerating full liquid no complaints   Examination:  General exam: Appears calm and comfortable  Respiratory system: Clear to auscultation. Respiratory effort normal. Cardiovascular system: S1 & S2 heard, RRR. No JVD, murmurs, rubs, gallops or clicks. No pedal edema. Gastrointestinal system: Abdomen is nondistended, soft and nontender. No organomegaly or masses felt. Normal bowel sounds heard. Central nervous system: Alert and oriented. No focal neurological deficits. Extremities: Symmetric 5 x 5 power. Skin: No rashes, lesions or ulcers Psychiatry: Judgement and insight appear normal. Mood & affect appropriate.                Diet Orders (From admission, onward)     Start     Ordered   05/30/24 1719  Diet full liquid Fluid consistency: Thin  Diet effective now       Question:  Fluid consistency:  Answer:  Thin   05/30/24 1718            Objective: Vitals:   05/30/24 1700 05/30/24 1730 05/30/24 2025 05/31/24 0849  BP: 120/78 131/82 136/85 137/86  Pulse: 74 73 90 79  Resp:  12  19 18   Temp:  98 F (36.7 C) 98.3 F (36.8 C) 98 F (36.7 C)  TempSrc:  Oral Oral   SpO2: 95% 100% 96% 95%  Weight:      Height:        Intake/Output Summary (Last 24 hours) at 05/31/2024 1131 Last data filed at 05/31/2024 0900 Gross per 24 hour  Intake 793 ml  Output 0 ml  Net 793 ml   Filed  Weights   05/24/24 1212 05/25/24 0800 05/30/24 1427  Weight: 108.9 kg 108 kg 108 kg    Scheduled Meds:  ciprofloxacin   500 mg Oral BID   feeding supplement  237 mL Oral BID BM   insulin  aspart  0-9 Units Subcutaneous TID WC   multivitamin with minerals  1 tablet Oral Daily   nicotine   14 mg Transdermal Daily   pantoprazole   40 mg Oral Q1200   sodium chloride  flush  3 mL Intravenous Q12H   Continuous Infusions:  Nutritional status Signs/Symptoms: percent weight loss, mild muscle depletion Percent weight loss: 7.7 % Interventions: Ensure Enlive (each supplement provides 350kcal and 20 grams of protein), MVI, Magic cup, Liberalize Diet Body mass index is 39.62 kg/m.  Data Reviewed:   CBC: Recent Labs  Lab 05/25/24 0547 05/26/24 0536 05/27/24 1438 05/29/24 0534 05/31/24 0450  WBC 11.6* 15.2* 12.1* 12.3* 10.8*  HGB 10.3* 10.3* 10.9* 10.8* 10.3*  HCT 32.7* 33.2* 34.9* 34.9* 33.2*  MCV 80.1 81.2 80.0 79.7* 79.6*  PLT 530* 505* 493* 524* 503*   Basic Metabolic Panel: Recent Labs  Lab 05/25/24 0547 05/26/24 0536 05/29/24 0534 05/30/24 0537 05/31/24 0450  NA 136 139 137 137 137  K 4.0 3.6 4.1 4.1 4.5  CL 101 104 102 101 101  CO2 24 24 26 24  21*  GLUCOSE 117* 109* 97 115* 107*  BUN 6 11 9 13 9   CREATININE 0.73 0.72 0.70 0.80 0.75  CALCIUM 9.1 8.6* 8.9 9.2 8.9  MG 1.7 1.8  --   --  1.7   GFR: Estimated Creatinine Clearance: 117.6 mL/min (by C-G formula based on SCr of 0.75 mg/dL). Liver Function Tests: Recent Labs  Lab 05/26/24 0536 05/30/24 0537 05/31/24 0450  AST 12* 13* 17  ALT 10 11 11   ALKPHOS 57 69 68  BILITOT 0.3 0.3 0.4  PROT 5.7* 6.3* 6.0*  ALBUMIN 2.4* 2.5* 2.4*   No results for input(s): LIPASE, AMYLASE in the last 168 hours. No results for input(s): AMMONIA in the last 168 hours. Coagulation Profile: No results for input(s): INR, PROTIME in the last 168 hours. Cardiac Enzymes: No results for input(s): CKTOTAL, CKMB,  CKMBINDEX, TROPONINI in the last 168 hours. BNP (last 3 results) No results for input(s): PROBNP in the last 8760 hours. HbA1C: No results for input(s): HGBA1C in the last 72 hours. CBG: Recent Labs  Lab 05/30/24 1157 05/30/24 1725 05/30/24 2030 05/31/24 0801 05/31/24 1108  GLUCAP 84 113* 126* 98 142*   Lipid Profile: No results for input(s): CHOL, HDL, LDLCALC, TRIG, CHOLHDL, LDLDIRECT in the last 72 hours. Thyroid Function Tests: No results for input(s): TSH, T4TOTAL, FREET4, T3FREE, THYROIDAB in the last 72 hours. Anemia Panel: No results for input(s): VITAMINB12, FOLATE, FERRITIN, TIBC, IRON , RETICCTPCT in the last 72 hours. Sepsis Labs: No results for input(s): PROCALCITON, LATICACIDVEN in the last 168 hours.  Recent Results (from the past 240 hours)  Aerobic/Anaerobic Culture w Gram Stain (surgical/deep wound)     Status: None   Collection Time: 05/24/24  1:51 PM   Specimen: PATH Cytology FNA; Body Fluid  Result Value Ref Range Status   Specimen Description FLUID  Final   Special Requests PANCREATIC FLUID  Final   Gram Stain NO WBC SEEN NO ORGANISMS SEEN   Final   Culture   Final    No growth aerobically or anaerobically. Performed at [facility], [address] [place], [place] [address]    Report Status 05/29/2024 FINAL  Final  Aerobic/Anaerobic Culture w Gram Stain (surgical/deep wound)     Status: None (Preliminary result)   Collection Time: 05/30/24  4:02 PM   Specimen: PATH Cytology FNA; Body Fluid  Result Value Ref Range Status   Specimen Description FLUID  Final   Special Requests PANCREATIC CYSTS  Final   Gram Stain   Final    FEW WBC PRESENT, PREDOMINANTLY PMN NO ORGANISMS SEEN    Culture   Final    CULTURE REINCUBATED FOR BETTER GROWTH Performed at [facility], [address], [place], [place] [address]    Report Status PENDING  Incomplete         Radiology Studies: CT ABDOMEN W  CONTRAST Result Date: 05/30/2024 CLINICAL DATA:  Left upper quadrant pain, history of pancreatic pseudocyst and recent endoscopic transgastric drainage. EXAM: CT ABDOMEN WITH CONTRAST TECHNIQUE: Multidetector CT imaging of the abdomen was performed using the standard protocol following bolus administration of intravenous contrast. RADIATION DOSE REDUCTION: This exam was performed according to the departmental dose-optimization program which includes automated exposure control, adjustment of the mA and/or kV according to patient size and/or use of iterative reconstruction technique. CONTRAST:  75mL OMNIPAQUE  IOHEXOL  350 MG/ML SOLN COMPARISON:  05/28/2024 FINDINGS: Lower chest: Right lung base is within normal limits. Left lung base shows small pleural effusion and left lower lobe consolidation similar to that seen on the prior exam. Hepatobiliary: No focal liver abnormality is seen. Status post cholecystectomy. No biliary dilatation. Pancreas: Pancreas is again well visualized. Small pseudocyst is noted within the tail of the pancreas. The previously seen extensive multiloculated fluid collection in the left upper quadrant has nearly completely resolved following cyst drainage VA transgastric approach. There remains some inflammatory change and collapsed cyst walls adjacent to the stomach. A small curvilinear collection is noted posterior to the spleen beneath the hemidiaphragm but significantly decreased now measuring approximately 13 mm in thickness decreased from 40 mm in thickness. No other sizable component of the previously seen collection is noted. A small extraluminal collection is noted best seen on image number 37 of series 5 lateral to the splenic flexure measuring approximately 3.3 x 2.2 cm. This is stable in appearance from the prior exam and likely represents a separate cyst. Spleen: Spleen is within normal limits. Adrenals/Urinary Tract: Adrenal glands are within normal limits. Kidneys demonstrate a  normal enhancement pattern. Stomach/Bowel: Stomach is decompressed. The degree of mass effect upon the stomach has resolved following cyst drainage. Visualized large and small bowel is within normal limits. Vascular/Lymphatic: Aortic atherosclerosis. No enlarged abdominal or pelvic lymph nodes. Other: None Musculoskeletal: No acute or significant osseous findings. IMPRESSION: There is been significant decompression of the multiloculated cystic structure in the left upper quadrant following transgastric cyst drainage. There remains a E tiny component interposed between the stomach and spleen within multiple collapsed walls as well as a small curvilinear collection posterior to the spleen which is significantly reduced in size. A separate small cystic lesion is noted lateral to the splenic flexure as described. This is  stable from the prior exam. Persistent pseudocyst in the pancreatic tail. No other focal abnormality is noted. Electronically Signed   By: Oneil Devonshire M.D.   On: 05/30/2024 23:10           LOS: 14 days   Time spent= 35 mins    Burgess JAYSON Dare, MD [facility]  If 7PM-7AM, please contact night-coverage  05/31/2024, 11:31 AM

## 2024-06-01 ENCOUNTER — Inpatient Hospital Stay (HOSPITAL_COMMUNITY): Payer: MEDICAID

## 2024-06-01 ENCOUNTER — Encounter (HOSPITAL_COMMUNITY): Payer: Self-pay | Admitting: Gastroenterology

## 2024-06-01 DIAGNOSIS — K859 Acute pancreatitis without necrosis or infection, unspecified: Secondary | ICD-10-CM | POA: Diagnosis not present

## 2024-06-01 LAB — COMPREHENSIVE METABOLIC PANEL WITH GFR
ALT: 15 U/L (ref 0–44)
AST: 15 U/L (ref 15–41)
Albumin: 2.7 g/dL — ABNORMAL LOW (ref 3.5–5.0)
Alkaline Phosphatase: 76 U/L (ref 38–126)
Anion gap: 11 (ref 5–15)
BUN: 11 mg/dL (ref 6–20)
CO2: 22 mmol/L (ref 22–32)
Calcium: 8.8 mg/dL — ABNORMAL LOW (ref 8.9–10.3)
Chloride: 100 mmol/L (ref 98–111)
Creatinine, Ser: 0.9 mg/dL (ref 0.44–1.00)
GFR, Estimated: >60 mL/min (ref >60)
Glucose, Bld: 114 mg/dL — ABNORMAL HIGH (ref 70–99)
Potassium: 4.3 mmol/L (ref 3.5–5.1)
Sodium: 133 mmol/L — ABNORMAL LOW (ref 135–145)
Total Bilirubin: 0.4 mg/dL (ref 0.0–1.2)
Total Protein: 6.2 g/dL — ABNORMAL LOW (ref 6.5–8.1)

## 2024-06-01 LAB — CBC
HCT: 34.8 % — ABNORMAL LOW (ref 36.0–46.0)
Hemoglobin: 10.6 g/dL — ABNORMAL LOW (ref 12.0–15.0)
MCH: 24.8 pg — ABNORMAL LOW (ref 26.0–34.0)
MCHC: 30.5 g/dL (ref 30.0–36.0)
MCV: 81.3 fL (ref 80.0–100.0)
Platelets: 571 K/uL — ABNORMAL HIGH (ref 150–400)
RBC: 4.28 MIL/uL (ref 3.87–5.11)
RDW: 14.6 % (ref 11.5–15.5)
WBC: 12 K/uL — ABNORMAL HIGH (ref 4.0–10.5)
nRBC: 0 % (ref 0.0–0.2)

## 2024-06-01 LAB — MAGNESIUM: Magnesium: 1.7 mg/dL (ref 1.7–2.4)

## 2024-06-01 LAB — GLUCOSE, CAPILLARY
Glucose-Capillary: 132 mg/dL — ABNORMAL HIGH (ref 70–99)
Glucose-Capillary: 140 mg/dL — ABNORMAL HIGH (ref 70–99)
Glucose-Capillary: 113 mg/dL — ABNORMAL HIGH (ref 70–99)
Glucose-Capillary: 136 mg/dL — ABNORMAL HIGH (ref 70–99)

## 2024-06-01 MED ORDER — HYDROMORPHONE HCL 1 MG/ML IJ SOLN: 0.5000 mg | INTRAMUSCULAR | Status: DC | PRN

## 2024-06-01 MED ORDER — IOHEXOL 350 MG/ML SOLN
75.0000 mL | Freq: Once | INTRAVENOUS | Status: AC | PRN
Start: 2024-06-01 — End: 2024-06-01
  Administered 2024-06-01: 75 mL via INTRAVENOUS

## 2024-06-01 MED ORDER — HYDROMORPHONE HCL 1 MG/ML IJ SOLN
0.5000 mg | INTRAMUSCULAR | Status: DC | PRN
  Administered 2024-06-01 – 2024-06-04 (×26): 1 mg via INTRAVENOUS
  Filled 2024-06-01 (×27): qty 1

## 2024-06-01 MED ORDER — OXYCODONE HCL 5 MG PO TABS
5.0000 mg | ORAL_TABLET | Freq: Four times a day (QID) | ORAL | Status: DC | PRN
  Administered 2024-06-02 – 2024-06-04 (×8): 5 mg via ORAL
  Filled 2024-06-01 (×9): qty 1

## 2024-06-01 NOTE — Progress Notes (Signed)
 PROGRESS NOTE    Katrina Lambert  FMW:[id number] DOB: Nov 10, 1986 DOA: 05/17/2024 PCP: [facility]    Brief Narrative:  38 y.o. female with PMH of DM-2, HTN, asthma, IDA, morbid obesity, B12 deficiency, cervical myelopathy, tobacco use disorder and recent hospitalization at [facility] from 5/3-5/6 for idiopathic pancreatitis and at [facility] 5/31/611 for recurrent pancreatitis with pseudocyst as well as hospital-acquired pneumonia complicated by parapneumonic effusion requiring chest tube placement returning to ED with worsening intermittent abdominal pain, nausea and vomiting since her discharge from the hospital. Being managed for abdominal pain in the setting of pancreatic pseudocyst, s/p percutaneous cyst drainage.    Assessment & Plan:  Principal Problem:   Pancreatic abscess Active Problems:   Acute pancreatitis   Pleural effusion on left   Left upper quadrant pain   Nausea in adult   Encounter for smoking cessation counseling   Acute pancreatic fluid collection   Malnutrition of moderate degree   Pancreatic pseudocyst   Gastritis without bleeding    Acute on chronic pancreatitis with pancreatic pseudocyst - S/p CT-guided diagnostic aspiration 7/3.  S/p EGD with EUS assisted drainage 7/9.  Drained 800 mL cyst.   Continue analgesics as needed for pain. Repeat CT shows essentially unchanged cyst (meaning fluid has re-accumulated).  Repeat EUS showed cystic lesion in the peripancreatic region consistent with pseudocyst, fine-needle aspiration retrieved 450 cc.  Does have some likely walled off necrosis.  Repeat CT from 7/15 shows decompression after aspiration - Plan is to clinically observe, advance diet as tolerated, Cipro  twice daily for next 3 days.  Plans to repeat CT A/P later today per GI.      SIRS with lactic acidosis, tachycardia and leukocytosis.  Leukocytosis resolved.   -Sepsis ruled out. Continue supportive care   Type II diabetes mellitus - A1c 7.3. NovoLog  as  needed per sliding scale.   Hypokalemia/hypomagnesemia Improved.  Replete as needed   Comorbidities including hypertension, tobacco use disorder on nicotine  patch, obesity    Nutrition Problem: Moderate Malnutrition Etiology: acute illness   Signs/Symptoms: percent weight loss, mild muscle depletion Percent weight loss: 7.7 %     Body mass index is 39.62 kg/m.  (Class II obesity)     DVT prophylaxis: SCDs Start: 05/17/24 2001    Code Status: Full Code Family Communication:   Status is: Inpatient Continue hospital stay until cleared by gastroenterology  Subjective:  Feels ok as long as she eats smaller portion meals.   Examination:  General exam: Appears calm and comfortable  Respiratory system: Clear to auscultation. Respiratory effort normal. Cardiovascular system: S1 & S2 heard, RRR. No JVD, murmurs, rubs, gallops or clicks. No pedal edema. Gastrointestinal system: Abdomen is nondistended, soft and nontender. No organomegaly or masses felt. Normal bowel sounds heard. Central nervous system: Alert and oriented. No focal neurological deficits. Extremities: Symmetric 5 x 5 power. Skin: No rashes, lesions or ulcers Psychiatry: Judgement and insight appear normal. Mood & affect appropriate.                Diet Orders (From admission, onward)     Start     Ordered   05/31/24 1300  Diet regular Room service appropriate? Yes; Fluid consistency: Thin  Diet effective now       Question Answer Comment  Room service appropriate? Yes   Fluid consistency: Thin      05/31/24 1259            Objective: Vitals:   05/31/24 1613 05/31/24 2110  06/01/24 0603 06/01/24 0805  BP: (!) 144/93 (!) 152/96 (!) 161/119 (!) 157/97  Pulse: 88 91 80 85  Resp: 18 18 18 18   Temp: 98 F (36.7 C) 98.1 F (36.7 C) 97.9 F (36.6 C) 98.6 F (37 C)  TempSrc:      SpO2: 97% 99% 98% 97%  Weight:      Height:        Intake/Output Summary (Last 24 hours) at 06/01/2024  1217 Last data filed at 06/01/2024 1000 Gross per 24 hour  Intake 350 ml  Output --  Net 350 ml   Filed Weights   05/24/24 1212 05/25/24 0800 05/30/24 1427  Weight: 108.9 kg 108 kg 108 kg    Scheduled Meds:  ciprofloxacin   500 mg Oral BID   feeding supplement  237 mL Oral BID BM   insulin  aspart  0-9 Units Subcutaneous TID WC   multivitamin with minerals  1 tablet Oral Daily   nicotine   14 mg Transdermal Daily   pantoprazole   40 mg Oral Q1200   sodium chloride  flush  3 mL Intravenous Q12H   Continuous Infusions:  Nutritional status Signs/Symptoms: percent weight loss, mild muscle depletion Percent weight loss: 7.7 % Interventions: Ensure Enlive (each supplement provides 350kcal and 20 grams of protein), MVI, Magic cup, Liberalize Diet Body mass index is 39.62 kg/m.  Data Reviewed:   CBC: Recent Labs  Lab 05/26/24 0536 05/27/24 1438 05/29/24 0534 05/31/24 0450 06/01/24 0514  WBC 15.2* 12.1* 12.3* 10.8* 12.0*  HGB 10.3* 10.9* 10.8* 10.3* 10.6*  HCT 33.2* 34.9* 34.9* 33.2* 34.8*  MCV 81.2 80.0 79.7* 79.6* 81.3  PLT 505* 493* 524* 503* 571*   Basic Metabolic Panel: Recent Labs  Lab 05/26/24 0536 05/29/24 0534 05/30/24 0537 05/31/24 0450 06/01/24 0514  NA 139 137 137 137 133*  K 3.6 4.1 4.1 4.5 4.3  CL 104 102 101 101 100  CO2 24 26 24  21* 22  GLUCOSE 109* 97 115* 107* 114*  BUN 11 9 13 9 11   CREATININE 0.72 0.70 0.80 0.75 0.90  CALCIUM 8.6* 8.9 9.2 8.9 8.8*  MG 1.8  --   --  1.7 1.7   GFR: Estimated Creatinine Clearance: 104.6 mL/min (by C-G formula based on SCr of 0.9 mg/dL). Liver Function Tests: Recent Labs  Lab 05/26/24 0536 05/30/24 0537 05/31/24 0450 06/01/24 0514  AST 12* 13* 17 15  ALT 10 11 11 15   ALKPHOS 57 69 68 76  BILITOT 0.3 0.3 0.4 0.4  PROT 5.7* 6.3* 6.0* 6.2*  ALBUMIN 2.4* 2.5* 2.4* 2.7*   No results for input(s): LIPASE, AMYLASE in the last 168 hours. No results for input(s): AMMONIA in the last 168  hours. Coagulation Profile: No results for input(s): INR, PROTIME in the last 168 hours. Cardiac Enzymes: No results for input(s): CKTOTAL, CKMB, CKMBINDEX, TROPONINI in the last 168 hours. BNP (last 3 results) No results for input(s): PROBNP in the last 8760 hours. HbA1C: No results for input(s): HGBA1C in the last 72 hours. CBG: Recent Labs  Lab 05/31/24 1108 05/31/24 1614 05/31/24 2110 06/01/24 0729 06/01/24 1129  GLUCAP 142* 131* 128* 132* 140*   Lipid Profile: No results for input(s): CHOL, HDL, LDLCALC, TRIG, CHOLHDL, LDLDIRECT in the last 72 hours. Thyroid Function Tests: No results for input(s): TSH, T4TOTAL, FREET4, T3FREE, THYROIDAB in the last 72 hours. Anemia Panel: No results for input(s): VITAMINB12, FOLATE, FERRITIN, TIBC, IRON , RETICCTPCT in the last 72 hours. Sepsis Labs: No results for input(s): PROCALCITON, LATICACIDVEN  in the last 168 hours.  Recent Results (from the past 240 hours)  Aerobic/Anaerobic Culture w Gram Stain (surgical/deep wound)     Status: None   Collection Time: 05/24/24  1:51 PM   Specimen: PATH Cytology FNA; Body Fluid  Result Value Ref Range Status   Specimen Description FLUID  Final   Special Requests PANCREATIC FLUID  Final   Gram Stain NO WBC SEEN NO ORGANISMS SEEN   Final   Culture   Final    No growth aerobically or anaerobically. Performed at [facility], [address], [place], [place] [address]    Report Status 05/29/2024 FINAL  Final  Aerobic/Anaerobic Culture w Gram Stain (surgical/deep wound)     Status: None (Preliminary result)   Collection Time: 05/30/24  4:02 PM   Specimen: PATH Cytology FNA; Body Fluid  Result Value Ref Range Status   Specimen Description FLUID  Final   Special Requests PANCREATIC CYSTS  Final   Gram Stain   Final    FEW WBC PRESENT, PREDOMINANTLY PMN NO ORGANISMS SEEN    Culture   Final    CULTURE REINCUBATED FOR BETTER  GROWTH Performed at [facility], [address], [place], [place] [address]    Report Status PENDING  Incomplete         Radiology Studies: CT ABDOMEN W CONTRAST Result Date: 05/30/2024 CLINICAL DATA:  Left upper quadrant pain, history of pancreatic pseudocyst and recent endoscopic transgastric drainage. EXAM: CT ABDOMEN WITH CONTRAST TECHNIQUE: Multidetector CT imaging of the abdomen was performed using the standard protocol following bolus administration of intravenous contrast. RADIATION DOSE REDUCTION: This exam was performed according to the departmental dose-optimization program which includes automated exposure control, adjustment of the mA and/or kV according to patient size and/or use of iterative reconstruction technique. CONTRAST:  75mL OMNIPAQUE  IOHEXOL  350 MG/ML SOLN COMPARISON:  05/28/2024 FINDINGS: Lower chest: Right lung base is within normal limits. Left lung base shows small pleural effusion and left lower lobe consolidation similar to that seen on the prior exam. Hepatobiliary: No focal liver abnormality is seen. Status post cholecystectomy. No biliary dilatation. Pancreas: Pancreas is again well visualized. Small pseudocyst is noted within the tail of the pancreas. The previously seen extensive multiloculated fluid collection in the left upper quadrant has nearly completely resolved following cyst drainage VA transgastric approach. There remains some inflammatory change and collapsed cyst walls adjacent to the stomach. A small curvilinear collection is noted posterior to the spleen beneath the hemidiaphragm but significantly decreased now measuring approximately 13 mm in thickness decreased from 40 mm in thickness. No other sizable component of the previously seen collection is noted. A small extraluminal collection is noted best seen on image number 37 of series 5 lateral to the splenic flexure measuring approximately 3.3 x 2.2 cm. This is stable in appearance from the prior  exam and likely represents a separate cyst. Spleen: Spleen is within normal limits. Adrenals/Urinary Tract: Adrenal glands are within normal limits. Kidneys demonstrate a normal enhancement pattern. Stomach/Bowel: Stomach is decompressed. The degree of mass effect upon the stomach has resolved following cyst drainage. Visualized large and small bowel is within normal limits. Vascular/Lymphatic: Aortic atherosclerosis. No enlarged abdominal or pelvic lymph nodes. Other: None Musculoskeletal: No acute or significant osseous findings. IMPRESSION: There is been significant decompression of the multiloculated cystic structure in the left upper quadrant following transgastric cyst drainage. There remains a E tiny component interposed between the stomach and spleen within multiple collapsed walls  as well as a small curvilinear collection posterior to the spleen which is significantly reduced in size. A separate small cystic lesion is noted lateral to the splenic flexure as described. This is stable from the prior exam. Persistent pseudocyst in the pancreatic tail. No other focal abnormality is noted. Electronically Signed   By: Oneil Devonshire M.D.   On: 05/30/2024 23:10           LOS: 15 days   Time spent= 35 mins    Burgess JAYSON Dare, MD [facility]  If 7PM-7AM, please contact night-coverage  06/01/2024, 12:17 PM

## 2024-06-01 NOTE — Progress Notes (Signed)
 Patient ID: NOBLE BODIE, female   DOB: 01-18-1986, 38 y.o.   MRN: [id number]    Progress Note   Subjective   Day # 15 CC; acute severe pancreatitis, with large pseudocysts  Labs today-WBC 12.0/hemoglobin 10.6/hematocrit 34.8/platelets 571 Sodium 133/potassium 4.3 LFTs within normal limits  Status post upper EUS 05/30/2024 with pseudocyst transgastric aspiration of 450 cc of watery turbid fluid  Fluid culture showing rare Streptococcus mitis/oralis, culture still in progress   CT abdomen and pelvis post EUS showed that the previously seen extensive multiloculated fluid collection in the left upper quadrant had nearly completely resolved with some remaining inflammatory change and collapsed cyst walls adjacent to the stomach  Patient has done well with solid food, no nausea or vomiting no real increase in abdominal pain postprandially.  She is still using IV narcotics for pain control but plan is to start alternating oral and IV.  She is complaining of some pain across her mid back since the procedure which is not severe, she has been able to be up and ambulate, no shortness of breath      Objective   Vital signs in last 24 hours: Temp:  [97.9 F (36.6 C)-98.6 F (37 C)] 98.6 F (37 C) (07/17 0805) Pulse Rate:  [80-91] 85 (07/17 0805) Resp:  [18] 18 (07/17 0805) BP: (144-161)/(93-119) 157/97 (07/17 0805) SpO2:  [97 %-99 %] 97 % (07/17 0805) Last BM Date : 05/29/24 General: Young   white female in NAD, obese Heart:  Regular rate and rhythm; no murmurs Lungs: Respirations even and unlabored, lungs CTA bilaterally Abdomen:  Soft, mild tenderness across the upper abdomen, no significant fullness, no guarding or rebound. Normal bowel sounds. Extremities:  Without edema. Neurologic:  Alert and oriented,  grossly normal neurologically. Psych:  Cooperative. Normal mood and affect.  Intake/Output from previous day: 07/16 0701 - 07/17 0700 In: 240 [P.O.:240] Out: -  Intake/Output  this shift: Total I/O In: 450 [P.O.:450] Out: -   Lab Results: Recent Labs    05/31/24 0450 06/01/24 0514  WBC 10.8* 12.0*  HGB 10.3* 10.6*  HCT 33.2* 34.8*  PLT 503* 571*   BMET Recent Labs    05/30/24 0537 05/31/24 0450 06/01/24 0514  NA 137 137 133*  K 4.1 4.5 4.3  CL 101 101 100  CO2 24 21* 22  GLUCOSE 115* 107* 114*  BUN 13 9 11   CREATININE 0.80 0.75 0.90  CALCIUM 9.2 8.9 8.8*   LFT Recent Labs    06/01/24 0514  PROT 6.2*  ALBUMIN 2.7*  AST 15  ALT 15  ALKPHOS 76  BILITOT 0.4   PT/INR No results for input(s): LABPROT, INR in the last 72 hours.  Studies/Results: CT ABDOMEN W CONTRAST Result Date: 05/30/2024 CLINICAL DATA:  Left upper quadrant pain, history of pancreatic pseudocyst and recent endoscopic transgastric drainage. EXAM: CT ABDOMEN WITH CONTRAST TECHNIQUE: Multidetector CT imaging of the abdomen was performed using the standard protocol following bolus administration of intravenous contrast. RADIATION DOSE REDUCTION: This exam was performed according to the departmental dose-optimization program which includes automated exposure control, adjustment of the mA and/or kV according to patient size and/or use of iterative reconstruction technique. CONTRAST:  75mL OMNIPAQUE  IOHEXOL  350 MG/ML SOLN COMPARISON:  05/28/2024 FINDINGS: Lower chest: Right lung base is within normal limits. Left lung base shows small pleural effusion and left lower lobe consolidation similar to that seen on the prior exam. Hepatobiliary: No focal liver abnormality is seen. Status post cholecystectomy. No biliary dilatation.  Pancreas: Pancreas is again well visualized. Small pseudocyst is noted within the tail of the pancreas. The previously seen extensive multiloculated fluid collection in the left upper quadrant has nearly completely resolved following cyst drainage VA transgastric approach. There remains some inflammatory change and collapsed cyst walls adjacent to the stomach. A  small curvilinear collection is noted posterior to the spleen beneath the hemidiaphragm but significantly decreased now measuring approximately 13 mm in thickness decreased from 40 mm in thickness. No other sizable component of the previously seen collection is noted. A small extraluminal collection is noted best seen on image number 37 of series 5 lateral to the splenic flexure measuring approximately 3.3 x 2.2 cm. This is stable in appearance from the prior exam and likely represents a separate cyst. Spleen: Spleen is within normal limits. Adrenals/Urinary Tract: Adrenal glands are within normal limits. Kidneys demonstrate a normal enhancement pattern. Stomach/Bowel: Stomach is decompressed. The degree of mass effect upon the stomach has resolved following cyst drainage. Visualized large and small bowel is within normal limits. Vascular/Lymphatic: Aortic atherosclerosis. No enlarged abdominal or pelvic lymph nodes. Other: None Musculoskeletal: No acute or significant osseous findings. IMPRESSION: There is been significant decompression of the multiloculated cystic structure in the left upper quadrant following transgastric cyst drainage. There remains a E tiny component interposed between the stomach and spleen within multiple collapsed walls as well as a small curvilinear collection posterior to the spleen which is significantly reduced in size. A separate small cystic lesion is noted lateral to the splenic flexure as described. This is stable from the prior exam. Persistent pseudocyst in the pancreatic tail. No other focal abnormality is noted. Electronically Signed   By: Oneil Devonshire M.D.   On: 05/30/2024 23:10       Assessment / Plan:    #28 38 year old white female with severe acute idiopathic pancreatitis (status post prior cholecystectomy) she has been complicated by large multiloculated pseudocysts  Patient had initial EUS with aspiration on 05/24/2024 800 cc removed, cytology negative Unfortunately  repeat CT 05/28/2024 showed no significant change in the size of the pseudocyst which was quite large at 13 x 4 x 8.4 cm occupying the majority of the left upper quadrant and perisplenic area  Repeat EUS with transgastric cyst aspiration on 05/30/2024 with removal of 450 cc of fluid Follow-up CT showed almost complete resolution of the large pseudocyst with collapse against the gastric wall  Patient has done well postprocedure, she does have some complaints of mid back pain which is different, she is able to tolerate solid food has not had any vomiting and is not noticing increase in abdominal pain postprandially though still using narcotic pain control IV.  Repeat CT is pending this afternoon  Plan; no change in regimen currently Will follow-up CT results Follow-up final fluid culture (question oral contaminant) She has reaccumulation of fluid, that will be very suggestive of pancreatic duct disruption, and she would require pancreatic ERCP.    Principal Problem:   Pancreatic abscess Active Problems:   Acute pancreatitis   Pleural effusion on left   Left upper quadrant pain   Nausea in adult   Encounter for smoking cessation counseling   Acute pancreatic fluid collection   Malnutrition of moderate degree   Pancreatic pseudocyst   Gastritis without bleeding   Acquired splenic cyst   Generalized abdominal pain   Pseudocyst of pancreas   Peripancreatic fluid collection   LUQ pain     LOS: 15 days   Kiannah Grunow EsterwoodPA-C  06/01/2024, 3:46 PM

## 2024-06-01 NOTE — Plan of Care (Signed)
  Problem: Health Behavior/Discharge Planning: Goal: Ability to manage health-related needs will improve Outcome: Progressing   Problem: Clinical Measurements: Goal: Ability to maintain clinical measurements within normal limits will improve Outcome: Progressing Goal: Will remain free from infection Outcome: Progressing Goal: Diagnostic test results will improve Outcome: Progressing Goal: Respiratory complications will improve Outcome: Progressing Goal: Cardiovascular complication will be avoided Outcome: Progressing   Problem: Nutrition: Goal: Adequate nutrition will be maintained Outcome: Progressing   Problem: Coping: Goal: Level of anxiety will decrease Outcome: Progressing   Problem: Elimination: Goal: Will not experience complications related to bowel motility Outcome: Progressing Goal: Will not experience complications related to urinary retention Outcome: Progressing   Problem: Pain Managment: Goal: General experience of comfort will improve and/or be controlled Outcome: Not Progressing   Spacing pain meds out some. Still needing IV

## 2024-06-02 DIAGNOSIS — K859 Acute pancreatitis without necrosis or infection, unspecified: Secondary | ICD-10-CM | POA: Diagnosis not present

## 2024-06-02 LAB — CBC
HCT: 36.4 % (ref 36.0–46.0)
Hemoglobin: 11.4 g/dL — ABNORMAL LOW (ref 12.0–15.0)
MCH: 25.1 pg — ABNORMAL LOW (ref 26.0–34.0)
MCHC: 31.3 g/dL (ref 30.0–36.0)
MCV: 80.2 fL (ref 80.0–100.0)
Platelets: 540 K/uL — ABNORMAL HIGH (ref 150–400)
RBC: 4.54 MIL/uL (ref 3.87–5.11)
RDW: 15 % (ref 11.5–15.5)
WBC: 12.9 K/uL — ABNORMAL HIGH (ref 4.0–10.5)
nRBC: 0 % (ref 0.0–0.2)

## 2024-06-02 LAB — GLUCOSE, CAPILLARY
Glucose-Capillary: 129 mg/dL — ABNORMAL HIGH (ref 70–99)
Glucose-Capillary: 99 mg/dL (ref 70–99)
Glucose-Capillary: 98 mg/dL (ref 70–99)
Glucose-Capillary: 146 mg/dL — ABNORMAL HIGH (ref 70–99)

## 2024-06-02 LAB — COMPREHENSIVE METABOLIC PANEL WITH GFR
ALT: 12 U/L (ref 0–44)
AST: 16 U/L (ref 15–41)
Albumin: 2.6 g/dL — ABNORMAL LOW (ref 3.5–5.0)
Alkaline Phosphatase: 72 U/L (ref 38–126)
Anion gap: 9 (ref 5–15)
BUN: 11 mg/dL (ref 6–20)
CO2: 26 mmol/L (ref 22–32)
Calcium: 9 mg/dL (ref 8.9–10.3)
Chloride: 101 mmol/L (ref 98–111)
Creatinine, Ser: 0.78 mg/dL (ref 0.44–1.00)
GFR, Estimated: >60 mL/min (ref >60)
Glucose, Bld: 109 mg/dL — ABNORMAL HIGH (ref 70–99)
Potassium: 4.9 mmol/L (ref 3.5–5.1)
Sodium: 136 mmol/L (ref 135–145)
Total Bilirubin: 0.6 mg/dL (ref 0.0–1.2)
Total Protein: 6 g/dL — ABNORMAL LOW (ref 6.5–8.1)

## 2024-06-02 LAB — MAGNESIUM: Magnesium: 1.8 mg/dL (ref 1.7–2.4)

## 2024-06-02 MED ORDER — OCTREOTIDE ACETATE 50 MCG/ML IJ SOLN
100.0000 ug | Freq: Two times a day (BID) | INTRAMUSCULAR | Status: DC
  Administered 2024-06-02 – 2024-06-03 (×2): 100 ug via SUBCUTANEOUS
  Filled 2024-06-02 (×5): qty 2

## 2024-06-02 MED ORDER — SODIUM CHLORIDE 0.9 % IV SOLN
2.0000 g | INTRAVENOUS | Status: DC
  Administered 2024-06-02 – 2024-06-04 (×3): 2 g via INTRAVENOUS
  Filled 2024-06-02 (×3): qty 20

## 2024-06-02 NOTE — Progress Notes (Addendum)
 PROGRESS NOTE    Katrina Lambert  FMW:[id number] DOB: Jun 14, 1986 DOA: 05/17/2024 PCP: [facility]    Brief Narrative:  38 y.o. female with PMH of DM-2, HTN, asthma, IDA, morbid obesity, B12 deficiency, cervical myelopathy, tobacco use disorder and recent hospitalization at [facility] from 5/3-5/6 for idiopathic pancreatitis and at [facility] 5/31/611 for recurrent pancreatitis with pseudocyst as well as hospital-acquired pneumonia complicated by parapneumonic effusion requiring chest tube placement returning to ED with worsening intermittent abdominal pain, nausea and vomiting since her discharge from the hospital. Being managed for abdominal pain in the setting of pancreatic pseudocyst, s/p percutaneous cyst drainage. Repeat CT scan shows likely destructive pancreatic duct and some worsening of fluid collection.  GI team recommending transferring patient to [facility] center   Assessment & Plan:  Principal Problem:   Pancreatic abscess Active Problems:   Acute pancreatitis   Pleural effusion on left   Left upper quadrant pain   Nausea in adult   Encounter for smoking cessation counseling   Acute pancreatic fluid collection   Malnutrition of moderate degree   Pancreatic pseudocyst   Gastritis without bleeding    Acute on chronic pancreatitis with pancreatic pseudocyst -7/3 S/p CT-guided diagnostic aspiration  7/9  S/p EGD with EUS assisted drainage.  Drained 800 mL cyst.    7/15 Repeat EUS 7/15, drained 450cc Continue analgesics as needed for pain.  Despite of multilple decompression she continues to increase in recollection of her pancreatic fluid suggesting Pancreatic duct disruption.  Repeat CT scan shows likely destructive pancreatic duct and some worsening of fluid collection.  GI team recommending transferring patient to quaternary center I have reached out to [facility], awaiting callback   ADDENDUM 1:50pm: Discussed case with Dr Wallis Commodore from [facility] hospitalist team. Patient has  been accepted and on the waiting list for the transfer. They will consult GI team once the patient has arrived. We will accept the patient back once [facility] care center needs have been addressed.      SIRS with lactic acidosis, tachycardia and leukocytosis.  Leukocytosis resolved.   Stable.   Abx:  7/2 - 7/6 : Cefepime /Flagly 7/9-7/12: Cipro  7/15-7/18: Cipro     Type II diabetes mellitus - A1c 7.3. NovoLog  as needed per sliding scale.   Hypokalemia/hypomagnesemia Improved.  Replete as needed   Comorbidities including hypertension, tobacco use disorder on nicotine  patch, obesity    Nutrition Problem: Moderate Malnutrition Etiology: acute illness   Signs/Symptoms: percent weight loss, mild muscle depletion Percent weight loss: 7.7 %     Body mass index is 39.62 kg/m.  (Class II obesity)     DVT prophylaxis: SCDs Start: 05/17/24 2001    Code Status: Full Code Family Communication:   Status is: Inpatient Continue hospital stay until cleared by gastroenterology Hoping to transfer patient to higher level of care/center  Subjective:  Started having much more pain in the left upper abdomen with meals  Examination:  General exam: Appears calm and comfortable  Respiratory system: Clear to auscultation. Respiratory effort normal. Cardiovascular system: S1 & S2 heard, RRR. No JVD, murmurs, rubs, gallops or clicks. No pedal edema. Gastrointestinal system: Abdomen is nondistended, soft and nontender. No organomegaly or masses felt. Normal bowel sounds heard. Central nervous system: Alert and oriented. No focal neurological deficits. Extremities: Symmetric 5 x 5 power. Skin: No rashes, lesions or ulcers Psychiatry: Judgement and insight appear normal. Mood & affect appropriate.                Diet  Orders (From admission, onward)     Start     Ordered   05/31/24 1300  Diet regular Room service appropriate? Yes; Fluid consistency: Thin  Diet effective now        Question Answer Comment  Room service appropriate? Yes   Fluid consistency: Thin      05/31/24 1259            Objective: Vitals:   06/01/24 1644 06/01/24 2100 06/02/24 0513 06/02/24 0729  BP: (!) 143/85 130/83 126/84 (!) 140/101  Pulse: 77 97 (!) 105 (!) 109  Resp: 18 18 18 18   Temp: 98.3 F (36.8 C) 98.8 F (37.1 C) 98.7 F (37.1 C)   TempSrc:      SpO2: 97% 97% 94% 97%  Weight:      Height:        Intake/Output Summary (Last 24 hours) at 06/02/2024 1359 Last data filed at 06/02/2024 1241 Gross per 24 hour  Intake 200 ml  Output --  Net 200 ml   Filed Weights   05/24/24 1212 05/25/24 0800 05/30/24 1427  Weight: 108.9 kg 108 kg 108 kg    Scheduled Meds:  feeding supplement  237 mL Oral BID BM   insulin  aspart  0-9 Units Subcutaneous TID WC   multivitamin with minerals  1 tablet Oral Daily   nicotine   14 mg Transdermal Daily   pantoprazole   40 mg Oral Q1200   sodium chloride  flush  3 mL Intravenous Q12H   Continuous Infusions:  Nutritional status Signs/Symptoms: percent weight loss, mild muscle depletion Percent weight loss: 7.7 % Interventions: Ensure Enlive (each supplement provides 350kcal and 20 grams of protein), MVI, Magic cup, Liberalize Diet Body mass index is 39.62 kg/m.  Data Reviewed:   CBC: Recent Labs  Lab 05/27/24 1438 05/29/24 0534 05/31/24 0450 06/01/24 0514 06/02/24 0439  WBC 12.1* 12.3* 10.8* 12.0* 12.9*  HGB 10.9* 10.8* 10.3* 10.6* 11.4*  HCT 34.9* 34.9* 33.2* 34.8* 36.4  MCV 80.0 79.7* 79.6* 81.3 80.2  PLT 493* 524* 503* 571* 540*   Basic Metabolic Panel: Recent Labs  Lab 05/29/24 0534 05/30/24 0537 05/31/24 0450 06/01/24 0514 06/02/24 0439  NA 137 137 137 133* 136  K 4.1 4.1 4.5 4.3 4.9  CL 102 101 101 100 101  CO2 26 24 21* 22 26  GLUCOSE 97 115* 107* 114* 109*  BUN 9 13 9 11 11   CREATININE 0.70 0.80 0.75 0.90 0.78  CALCIUM 8.9 9.2 8.9 8.8* 9.0  MG  --   --  1.7 1.7 1.8   GFR: Estimated Creatinine  Clearance: 117.6 mL/min (by C-G formula based on SCr of 0.78 mg/dL). Liver Function Tests: Recent Labs  Lab 05/30/24 0537 05/31/24 0450 06/01/24 0514 06/02/24 0439  AST 13* 17 15 16   ALT 11 11 15 12   ALKPHOS 69 68 76 72  BILITOT 0.3 0.4 0.4 0.6  PROT 6.3* 6.0* 6.2* 6.0*  ALBUMIN 2.5* 2.4* 2.7* 2.6*   No results for input(s): LIPASE, AMYLASE in the last 168 hours. No results for input(s): AMMONIA in the last 168 hours. Coagulation Profile: No results for input(s): INR, PROTIME in the last 168 hours. Cardiac Enzymes: No results for input(s): CKTOTAL, CKMB, CKMBINDEX, TROPONINI in the last 168 hours. BNP (last 3 results) No results for input(s): PROBNP in the last 8760 hours. HbA1C: No results for input(s): HGBA1C in the last 72 hours. CBG: Recent Labs  Lab 06/01/24 1129 06/01/24 1612 06/01/24 2056 06/02/24 0725 06/02/24 1113  GLUCAP  140* 113* 136* 129* 99   Lipid Profile: No results for input(s): CHOL, HDL, LDLCALC, TRIG, CHOLHDL, LDLDIRECT in the last 72 hours. Thyroid Function Tests: No results for input(s): TSH, T4TOTAL, FREET4, T3FREE, THYROIDAB in the last 72 hours. Anemia Panel: No results for input(s): VITAMINB12, FOLATE, FERRITIN, TIBC, IRON , RETICCTPCT in the last 72 hours. Sepsis Labs: No results for input(s): PROCALCITON, LATICACIDVEN in the last 168 hours.  Recent Results (from the past 240 hours)  Aerobic/Anaerobic Culture w Gram Stain (surgical/deep wound)     Status: None   Collection Time: 05/24/24  1:51 PM   Specimen: PATH Cytology FNA; Body Fluid  Result Value Ref Range Status   Specimen Description FLUID  Final   Special Requests PANCREATIC FLUID  Final   Gram Stain NO WBC SEEN NO ORGANISMS SEEN   Final   Culture   Final    No growth aerobically or anaerobically. Performed at [facility], [address] [place], [place] [address]    Report Status 05/29/2024 FINAL  Final   Aerobic/Anaerobic Culture w Gram Stain (surgical/deep wound)     Status: None (Preliminary result)   Collection Time: 05/30/24  4:02 PM   Specimen: PATH Cytology FNA; Body Fluid  Result Value Ref Range Status   Specimen Description ABSCESS  Final   Special Requests PANCREATIC CYSTS  Final   Gram Stain   Final    FEW WBC PRESENT, PREDOMINANTLY PMN NO ORGANISMS SEEN Performed at [facility], [address] [place], [place] [address]    Culture   Final    RARE STREPTOCOCCUS MITIS/ORALIS NO ANAEROBES ISOLATED; CULTURE IN PROGRESS FOR 5 DAYS    Report Status PENDING  Incomplete   Organism ID, Bacteria STREPTOCOCCUS MITIS/ORALIS  Final      Susceptibility   Streptococcus mitis/oralis - MIC*    PENICILLIN 0.12 SENSITIVE Sensitive     CEFTRIAXONE <=0.12 SENSITIVE Sensitive     LEVOFLOXACIN 2 SENSITIVE Sensitive     VANCOMYCIN  0.25 SENSITIVE Sensitive     * RARE STREPTOCOCCUS MITIS/ORALIS         Radiology Studies: CT ABDOMEN W CONTRAST Result Date: 06/01/2024 CLINICAL DATA:  History of pancreatitis pseudocyst EXAM: CT ABDOMEN WITH CONTRAST TECHNIQUE: Multidetector CT imaging of the abdomen was performed using the standard protocol following bolus administration of intravenous contrast. RADIATION DOSE REDUCTION: This exam was performed according to the departmental dose-optimization program which includes automated exposure control, adjustment of the mA and/or kV according to patient size and/or use of iterative reconstruction technique. CONTRAST:  75mL OMNIPAQUE  IOHEXOL  350 MG/ML SOLN COMPARISON:  CT 05/30/2024, 05/28/2024, 05/21/2024, 04/15/2024 FINDINGS: Lower chest: Lung bases demonstrate small left-sided pleural effusion with dense left lower lobe consolidation, similar to slightly diminished compared to the CT 2 days ago. Hepatobiliary: Hepatic steatosis. Cholecystectomy. No biliary dilatation Pancreas: Mild residual stranding about the tail of the pancreas. Small cystic  collection at the tail of the pancreas measuring 16 x 11 mm, previously 22 x 16 mm. Spleen: Normal in size without focal abnormality. Adrenals/Urinary Tract: Adrenal glands are normal. Kidneys show no hydronephrosis. Stomach/Bowel: Stomach slightly deviated to the patient's right by multiple rim enhancing fluid collection. Bowel is otherwise unremarkable Vascular/Lymphatic: Aortic atherosclerosis. No aneurysm. 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 Other: No free air. Multiple rim enhancing cystic collections with inflammatory stranding in the left upper quadrant, fluid collections are visible between the left diaphragm and spleen, adjacent to the gastric fundus and coursing toward still  of pancreas. Slight increased size of the collection between the diaphragm and the spleen, this collection measures approximately 8 x 5.8 cm on series 3 image 8 and 9.5 by 4.3 cm on coronal series 6, image 44. On the most recent prior, collection measured about 54 x 23 mm on axial images, by 8.6 cm in maximum length. Left subdiaphragmatic collection is contiguous with rim enhancing collection that extends anterior to the gastric fundal region, this measures approximately 5.7 x 2.8 cm on series 3, image 21, previously about 4.4 by 0.9 cm. Collection also extends posterior to the spleen. Multiple additional thin crescent shaped collections within the peripheral left upper and mid quadrant, lateral to the colon, collection measures about 22 mm maximum thickness on coronal series 6, image 68 and extends from just below the diaphragm to the level of the mid abdomen. This also appears slightly increased compared with the most recent prior exam Musculoskeletal: No acute osseous abnormality. 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 IMPRESSION: 1. Multiple rim enhancing fluid collections with inflammatory stranding in the left upper quadrant, suspect for  pseudocysts related to pancreatitis (sterility indeterminate on this CT). Dominant fluid collection between the left diaphragm and spleen is contiguous with fluid that tracks posterior to the spleen and is also contiguous with rim enhancing collection anterior to the gastric fundal region; these collections are increased compared to the most recent prior study from 07/15. Multiple additional thin crescent shaped collections within the peripheral left upper and mid quadrant, lateral to the colon, extending from the level of the diaphragm to the mid abdomen, also suspect for developing pseudocysts and are increased compared to the prior exam. Slight interval decrease in small pseudocyst at the pancreatic tail. 2. Small left-sided pleural effusion with dense left lower lobe consolidation, similar to slightly diminished compared to the CT 2 days ago. 3. Hepatic steatosis. Aortic Atherosclerosis (ICD10-I70.0). Electronically Signed   By: Luke Bun M.D.   On: 06/01/2024 23:36           LOS: 16 days   Time spent= 35 mins    Burgess JAYSON Dare, MD [facility]  If 7PM-7AM, please contact night-coverage  06/02/2024, 1:59 PM

## 2024-06-02 NOTE — Progress Notes (Signed)
 Patient ID: Katrina Lambert, female   DOB: May 16, 1986, 38 y.o.   MRN: [id number]    Progress Note   Subjective   Day # 16 CC; severe acute pancreatitis with large pseudocysts, idiopathic at this time and patient status post previous cholecystectomy  Repeat EUS on 12/01/2023 with transgastric pseudocyst aspiration of 450 cc of watery turbid fluid Culture still processing showing rare Streptococcus mitis/oralis and pansensitive.  Repeat CT last evening-small left pleural effusion with dense lower lobe consolidation similar to 2 days previous, status post cholecystectomy, hepatic steatosis, mild stranding around the tail of the pancreas with small cystic collection measuring 16 x 11 mm, there are multiple rim-enhancing cystic collections with inflammatory stranding in the left upper quadrant visible between the left diaphragm and the spleen adjacent to the gastric fundus and coursing towards the tail of pancreas, slightly increased size of the collection between the diaphragm and the spleen now measuring 8 x 5.8 cm previous 54 x 23 mm.  The left subdiaphragmatic collection is contiguous with rim-enhancing collection extends anterior to the gastric fundal region measuring 5.7 x 2.8 cm  Labs today WBC 12.9/hemoglobin 11.4/hematocrit 36.4 Potassium 4.9/BUN 11/creatinine 0.78 LFTs normal  Unfortunately pain has returned and is worse than prior to fluid aspirations, she is having pain in the mid back and again worsening of pain across the upper abdomen into the left abdomen and some down into her lower abdomen bilaterally.  She is not able to eat as well, trying to keep down clear liquids.   Objective   Vital signs in last 24 hours: Temp:  [98.3 F (36.8 C)-98.8 F (37.1 C)] 98.7 F (37.1 C) (07/18 0513) Pulse Rate:  [77-109] 109 (07/18 0729) Resp:  [18] 18 (07/18 0729) BP: (126-143)/(83-101) 140/101 (07/18 0729) SpO2:  [94 %-97 %] 97 % (07/18 0729) Last BM Date : 06/01/24 General:    Young obese  White female in NAD Heart:  Regular rate and rhythm; no murmurs Lungs: Respirations even and unlabored, decreased breath sounds left base Abdomen:  Soft, obese, she is tender across the mid and upper abdomen some guarding no rebound  , bowel sounds are present Extremities:  Without edema. Neurologic:  Alert and oriented,  grossly normal neurologically. Psych:  Cooperative. Normal mood and affect.  Intake/Output from previous day: 07/17 0701 - 07/18 0700 In: 500 [P.O.:500] Out: -  Intake/Output this shift: Total I/O In: 150 [P.O.:150] Out: -   Lab Results: Recent Labs    05/31/24 0450 06/01/24 0514 06/02/24 0439  WBC 10.8* 12.0* 12.9*  HGB 10.3* 10.6* 11.4*  HCT 33.2* 34.8* 36.4  PLT 503* 571* 540*   BMET Recent Labs    05/31/24 0450 06/01/24 0514 06/02/24 0439  NA 137 133* 136  K 4.5 4.3 4.9  CL 101 100 101  CO2 21* 22 26  GLUCOSE 107* 114* 109*  BUN 9 11 11   CREATININE 0.75 0.90 0.78  CALCIUM 8.9 8.8* 9.0   LFT Recent Labs    06/02/24 0439  PROT 6.0*  ALBUMIN 2.6*  AST 16  ALT 12  ALKPHOS 72  BILITOT 0.6   PT/INR No results for input(s): LABPROT, INR in the last 72 hours.  Studies/Results: CT ABDOMEN W CONTRAST Result Date: 06/01/2024 CLINICAL DATA:  History of pancreatitis pseudocyst EXAM: CT ABDOMEN WITH CONTRAST TECHNIQUE: Multidetector CT imaging of the abdomen was performed using the standard protocol following bolus administration of intravenous contrast. RADIATION DOSE REDUCTION: This exam was performed according to the departmental dose-optimization program  which includes automated exposure control, adjustment of the mA and/or kV according to patient size and/or use of iterative reconstruction technique. CONTRAST:  75mL OMNIPAQUE  IOHEXOL  350 MG/ML SOLN COMPARISON:  CT 05/30/2024, 05/28/2024, 05/21/2024, 04/15/2024 FINDINGS: Lower chest: Lung bases demonstrate small left-sided pleural effusion with dense left lower lobe consolidation, similar to  slightly diminished compared to the CT 2 days ago. Hepatobiliary: Hepatic steatosis. Cholecystectomy. No biliary dilatation Pancreas: Mild residual stranding about the tail of the pancreas. Small cystic collection at the tail of the pancreas measuring 16 x 11 mm, previously 22 x 16 mm. Spleen: Normal in size without focal abnormality. Adrenals/Urinary Tract: Adrenal glands are normal. Kidneys show no hydronephrosis. Stomach/Bowel: Stomach slightly deviated to the patient's right by multiple rim enhancing fluid collection. Bowel is otherwise unremarkable Vascular/Lymphatic: Aortic atherosclerosis. No aneurysm. 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 Other: No free air. Multiple rim enhancing cystic collections with inflammatory stranding in the left upper quadrant, fluid collections are visible between the left diaphragm and spleen, adjacent to the gastric fundus and coursing toward still of pancreas. Slight increased size of the collection between the diaphragm and the spleen, this collection measures approximately 8 x 5.8 cm on series 3 image 8 and 9.5 by 4.3 cm on coronal series 6, image 44. On the most recent prior, collection measured about 54 x 23 mm on axial images, by 8.6 cm in maximum length. Left subdiaphragmatic collection is contiguous with rim enhancing collection that extends anterior to the gastric fundal region, this measures approximately 5.7 x 2.8 cm on series 3, image 21, previously about 4.4 by 0.9 cm. Collection also extends posterior to the spleen. Multiple additional thin crescent shaped collections within the peripheral left upper and mid quadrant, lateral to the colon, collection measures about 22 mm maximum thickness on coronal series 6, image 68 and extends from just below the diaphragm to the level of the mid abdomen. This also appears slightly increased compared with the most recent prior exam Musculoskeletal: No acute osseous abnormality. Chronic appearing pars defect at  L3-L4 with advanced sclerotic degenerative changes and grade 1/ 2 anterolisthesis. IMPRESSION: 1. Multiple rim enhancing fluid collections with inflammatory stranding in the left upper quadrant, suspect for pseudocysts related to pancreatitis (sterility indeterminate on this CT). Dominant fluid collection between the left diaphragm and spleen is contiguous with fluid that tracks posterior to the spleen and is also contiguous with rim enhancing collection anterior to the gastric fundal region; these collections are increased compared to the most recent prior study from 07/15. Multiple additional thin crescent shaped collections within the peripheral left upper and mid quadrant, lateral to the colon, extending from the level of the diaphragm to the mid abdomen, also suspect for developing pseudocysts and are increased compared to the prior exam. Slight interval decrease in small pseudocyst at the pancreatic tail. 2. Small left-sided pleural effusion with dense left lower lobe consolidation, similar to slightly diminished compared to the CT 2 days ago. 3. Hepatic steatosis. Aortic Atherosclerosis (ICD10-I70.0). Electronically Signed   By: Luke Bun M.D.   On: 06/01/2024 23:36       Assessment / Plan:    #45 38 year old white female with severe acute pancreatitis (idiopathic at this time status post prior cholecystectomy) Course has now been complicated by large multiloculated pseudocysts.  She had EUS with aspiration 05/24/2024 with 800 cc removed, cytology negative Unfortunately repeat CT on 05/28/2024 showed no significant change in the size of the pseudocyst which was quite large  at 13 x 4 x 8.4 cm occupying the majority of the left upper quadrant and perisplenic area  Repeat EUS with transgastric cyst aspiration 05/30/2024 with removal of 450 cc Cultures now growing strep mitis/oralis  Repeat CT last p.m. due to concern for possible pancreatic duct leak did show rapid reaccumulation of the  fluid/pseudocyst, and patient has had increase in pain as well across the mid abdomen and upper abdomen.  She is tolerating some clear liquids.  With rapid reaccumulation of fluid patient is felt to have a duct disruption, and will require pancreatic ERCP.  She may need additional aspiration/drainage of fluid accumulation/pseudocysts  Plan; full liquid diet as tolerated with supplements, if she is unable to keep down p.o. she will require core track placement and postpyloric feeding. Transfer request has been placed and patient has been accepted at [facility], await bed availability We will start octreotide 100 mcg subcu twice daily Adequate pain control Start ceftriaxone IV   Principal Problem:   Pancreatic abscess Active Problems:   Acute pancreatitis   Pleural effusion on left   Left upper quadrant pain   Nausea in adult   Encounter for smoking cessation counseling   Acute pancreatic fluid collection   Malnutrition of moderate degree   Pancreatic pseudocyst   Gastritis without bleeding   Acquired splenic cyst   Generalized abdominal pain   Pseudocyst of pancreas   Peripancreatic fluid collection   LUQ pain     LOS: 16 days   Rhea Thrun PA-C 06/02/2024, 2:15 PM

## 2024-06-02 NOTE — Plan of Care (Signed)
  Problem: Health Behavior/Discharge Planning: Goal: Ability to manage health-related needs will improve Outcome: Progressing   Problem: Clinical Measurements: Goal: Ability to maintain clinical measurements within normal limits will improve Outcome: Not Progressing Goal: Will remain free from infection Outcome: Progressing Goal: Diagnostic test results will improve Outcome: Not Progressing Goal: Respiratory complications will improve Outcome: Progressing Goal: Cardiovascular complication will be avoided Outcome: Progressing   Problem: Nutrition: Goal: Adequate nutrition will be maintained Outcome: Not Progressing   Problem: Coping: Goal: Level of anxiety will decrease Outcome: Progressing   Problem: Elimination: Goal: Will not experience complications related to bowel motility Outcome: Progressing Goal: Will not experience complications related to urinary retention Outcome: Progressing   Problem: Pain Managment: Goal: General experience of comfort will improve and/or be controlled Outcome: Not Progressing   Problem: Safety: Goal: Ability to remain free from injury will improve Outcome: Progressing  Increase pain requirement, downgraded diet, awaiting transfer to [facility], time unknown

## 2024-06-03 DIAGNOSIS — K859 Acute pancreatitis without necrosis or infection, unspecified: Secondary | ICD-10-CM | POA: Diagnosis not present

## 2024-06-03 LAB — CBC
HCT: 38.6 % (ref 36.0–46.0)
Hemoglobin: 11.8 g/dL — ABNORMAL LOW (ref 12.0–15.0)
MCH: 24.7 pg — ABNORMAL LOW (ref 26.0–34.0)
MCHC: 30.6 g/dL (ref 30.0–36.0)
MCV: 80.9 fL (ref 80.0–100.0)
Platelets: 578 K/uL — ABNORMAL HIGH (ref 150–400)
RBC: 4.77 MIL/uL (ref 3.87–5.11)
RDW: 15.1 % (ref 11.5–15.5)
WBC: 14.9 K/uL — ABNORMAL HIGH (ref 4.0–10.5)
nRBC: 0 % (ref 0.0–0.2)

## 2024-06-03 LAB — GLUCOSE, CAPILLARY
Glucose-Capillary: 187 mg/dL — ABNORMAL HIGH (ref 70–99)
Glucose-Capillary: 110 mg/dL — ABNORMAL HIGH (ref 70–99)
Glucose-Capillary: 158 mg/dL — ABNORMAL HIGH (ref 70–99)
Glucose-Capillary: 107 mg/dL — ABNORMAL HIGH (ref 70–99)

## 2024-06-03 LAB — COMPREHENSIVE METABOLIC PANEL WITH GFR
ALT: 17 U/L (ref 0–44)
AST: 18 U/L (ref 15–41)
Albumin: 3 g/dL — ABNORMAL LOW (ref 3.5–5.0)
Alkaline Phosphatase: 93 U/L (ref 38–126)
Anion gap: 13 (ref 5–15)
BUN: 13 mg/dL (ref 6–20)
CO2: 25 mmol/L (ref 22–32)
Calcium: 9 mg/dL (ref 8.9–10.3)
Chloride: 96 mmol/L — ABNORMAL LOW (ref 98–111)
Creatinine, Ser: 1.07 mg/dL — ABNORMAL HIGH (ref 0.44–1.00)
GFR, Estimated: >60 mL/min (ref >60)
Glucose, Bld: 141 mg/dL — ABNORMAL HIGH (ref 70–99)
Potassium: 4.5 mmol/L (ref 3.5–5.1)
Sodium: 134 mmol/L — ABNORMAL LOW (ref 135–145)
Total Bilirubin: 0.6 mg/dL (ref 0.0–1.2)
Total Protein: 6.9 g/dL (ref 6.5–8.1)

## 2024-06-03 LAB — MAGNESIUM: Magnesium: 2 mg/dL (ref 1.7–2.4)

## 2024-06-03 MED ORDER — LACTATED RINGERS IV SOLN: INTRAVENOUS | Status: AC

## 2024-06-03 MED ORDER — OCTREOTIDE ACETATE 100 MCG/ML IJ SOLN
100.0000 ug | Freq: Two times a day (BID) | INTRAMUSCULAR | Status: DC
  Administered 2024-06-03 – 2024-06-04 (×2): 100 ug via SUBCUTANEOUS
  Filled 2024-06-03 (×3): qty 1

## 2024-06-03 NOTE — Progress Notes (Signed)
 PROGRESS NOTE    Katrina Lambert  FMW:[id number] DOB: 1986-04-23 DOA: 05/17/2024 PCP: [facility]    Brief Narrative:  38 y.o. female with PMH of DM-2, HTN, asthma, IDA, morbid obesity, B12 deficiency, cervical myelopathy, tobacco use disorder and recent hospitalization at [facility] from 5/3-5/6 for idiopathic pancreatitis and at [facility] 5/31/611 for recurrent pancreatitis with pseudocyst as well as hospital-acquired pneumonia complicated by parapneumonic effusion requiring chest tube placement returning to ED with worsening intermittent abdominal pain, nausea and vomiting since her discharge from the hospital. Being managed for abdominal pain in the setting of pancreatic pseudocyst, s/p percutaneous cyst drainage. Repeat CT scan shows likely destructive pancreatic duct and some worsening of fluid collection.  GI recommending to transfer higher level of care.  Patient has been accepted at [facility], awaiting transfer.   Assessment & Plan:  Principal Problem:   Pancreatic abscess Active Problems:   Acute pancreatitis   Pleural effusion on left   Left upper quadrant pain   Nausea in adult   Encounter for smoking cessation counseling   Acute pancreatic fluid collection   Malnutrition of moderate degree   Pancreatic pseudocyst   Gastritis without bleeding    Acute on chronic pancreatitis with pancreatic pseudocyst -7/3 S/p CT-guided diagnostic aspiration  7/9  S/p EGD with EUS assisted drainage.  Drained 800 mL cyst.    7/15 Repeat EUS 7/15, drained 450cc Continue analgesics as needed for pain.  Despite of multilple decompression she continues to increase in recollection of her pancreatic fluid suggesting Pancreatic duct disruption.  Repeat CT scan shows likely destructive pancreatic duct and some worsening of fluid collection.  While awaiting transfer, GI started octreotide  Awaiting transfer to [facility], excepted by hospitalist team Dr Wallis Commodore  -Most recent aspiration is  growing Streptococcus mitis/oralis, started Rocephin     Type II diabetes mellitus - A1c 7.3. NovoLog  as needed per sliding scale.   Hypokalemia/hypomagnesemia Improved.  Replete as needed   Comorbidities including hypertension, tobacco use disorder on nicotine  patch, obesity    Nutrition Problem: Moderate Malnutrition Etiology: acute illness   Signs/Symptoms: percent weight loss, mild muscle depletion Percent weight loss: 7.7 %     Body mass index is 39.62 kg/m.  (Class II obesity)     DVT prophylaxis: SCDs Start: 05/17/24 2001    Code Status: Full Code Family Communication:   Status is: Inpatient Awaiting transfer to [facility]  Subjective:  Seen at bedside no complaints.  She understands she is waiting for transfer to [facility] for further GI evaluation.  She feels comfortable with this plan  Examination:  General exam: Appears calm and comfortable  Respiratory system: Clear to auscultation. Respiratory effort normal. Cardiovascular system: S1 & S2 heard, RRR. No JVD, murmurs, rubs, gallops or clicks. No pedal edema. Gastrointestinal system: Abdomen is nondistended, soft and nontender. No organomegaly or masses felt. Normal bowel sounds heard. Central nervous system: Alert and oriented. No focal neurological deficits. Extremities: Symmetric 5 x 5 power. Skin: No rashes, lesions or ulcers Psychiatry: Judgement and insight appear normal. Mood & affect appropriate.                Diet Orders (From admission, onward)     Start     Ordered   06/02/24 1416  Diet full liquid Room service appropriate? Yes; Fluid consistency: Thin  Diet effective now       Comments: With supplements  Question Answer Comment  Room service appropriate? Yes   Fluid consistency: Thin  06/02/24 1415            Objective: Vitals:   06/02/24 1742 06/02/24 2002 06/03/24 0522 06/03/24 0816  BP: 131/81 125/79 110/77 107/65  Pulse: 93 93 84 90  Resp: 18 18 18 15    Temp: 98.3 F (36.8 C) 98.5 F (36.9 C) 98.3 F (36.8 C) 98 F (36.7 C)  TempSrc: Oral Oral Oral Oral  SpO2: 95% 95% 98% 95%  Weight:      Height:        Intake/Output Summary (Last 24 hours) at 06/03/2024 1334 Last data filed at 06/02/2024 1739 Gross per 24 hour  Intake 500 ml  Output --  Net 500 ml   Filed Weights   05/24/24 1212 05/25/24 0800 05/30/24 1427  Weight: 108.9 kg 108 kg 108 kg    Scheduled Meds:  feeding supplement  237 mL Oral BID BM   insulin  aspart  0-9 Units Subcutaneous TID WC   multivitamin with minerals  1 tablet Oral Daily   nicotine   14 mg Transdermal Daily   octreotide   100 mcg Subcutaneous Q12H   pantoprazole   40 mg Oral Q1200   sodium chloride  flush  3 mL Intravenous Q12H   Continuous Infusions:  cefTRIAXone  (ROCEPHIN )  IV Stopped (06/02/24 1541)   lactated ringers  100 mL/hr at 06/03/24 1252    Nutritional status Signs/Symptoms: percent weight loss, mild muscle depletion Percent weight loss: 7.7 % Interventions: Ensure Enlive (each supplement provides 350kcal and 20 grams of protein), MVI, Magic cup, Liberalize Diet Body mass index is 39.62 kg/m.  Data Reviewed:   CBC: Recent Labs  Lab 05/29/24 0534 05/31/24 0450 06/01/24 0514 06/02/24 0439 06/03/24 0522  WBC 12.3* 10.8* 12.0* 12.9* 14.9*  HGB 10.8* 10.3* 10.6* 11.4* 11.8*  HCT 34.9* 33.2* 34.8* 36.4 38.6  MCV 79.7* 79.6* 81.3 80.2 80.9  PLT 524* 503* 571* 540* 578*   Basic Metabolic Panel: Recent Labs  Lab 05/30/24 0537 05/31/24 0450 06/01/24 0514 06/02/24 0439 06/03/24 0522  NA 137 137 133* 136 134*  K 4.1 4.5 4.3 4.9 4.5  CL 101 101 100 101 96*  CO2 24 21* 22 26 25   GLUCOSE 115* 107* 114* 109* 141*  BUN 13 9 11 11 13   CREATININE 0.80 0.75 0.90 0.78 1.07*  CALCIUM 9.2 8.9 8.8* 9.0 9.0  MG  --  1.7 1.7 1.8 2.0   GFR: Estimated Creatinine Clearance: 88 mL/min (A) (by C-G formula based on SCr of 1.07 mg/dL (H)). Liver Function Tests: Recent Labs  Lab  05/30/24 0537 05/31/24 0450 06/01/24 0514 06/02/24 0439 06/03/24 0522  AST 13* 17 15 16 18   ALT 11 11 15 12 17   ALKPHOS 69 68 76 72 93  BILITOT 0.3 0.4 0.4 0.6 0.6  PROT 6.3* 6.0* 6.2* 6.0* 6.9  ALBUMIN 2.5* 2.4* 2.7* 2.6* 3.0*   No results for input(s): LIPASE, AMYLASE in the last 168 hours. No results for input(s): AMMONIA in the last 168 hours. Coagulation Profile: No results for input(s): INR, PROTIME in the last 168 hours. Cardiac Enzymes: No results for input(s): CKTOTAL, CKMB, CKMBINDEX, TROPONINI in the last 168 hours. BNP (last 3 results) No results for input(s): PROBNP in the last 8760 hours. HbA1C: No results for input(s): HGBA1C in the last 72 hours. CBG: Recent Labs  Lab 06/02/24 1113 06/02/24 1644 06/02/24 1958 06/03/24 0815 06/03/24 1228  GLUCAP 99 98 146* 187* 110*   Lipid Profile: No results for input(s): CHOL, HDL, LDLCALC, TRIG, CHOLHDL, LDLDIRECT in the last  72 hours. Thyroid Function Tests: No results for input(s): TSH, T4TOTAL, FREET4, T3FREE, THYROIDAB in the last 72 hours. Anemia Panel: No results for input(s): VITAMINB12, FOLATE, FERRITIN, TIBC, IRON , RETICCTPCT in the last 72 hours. Sepsis Labs: No results for input(s): PROCALCITON, LATICACIDVEN in the last 168 hours.  Recent Results (from the past 240 hours)  Aerobic/Anaerobic Culture w Gram Stain (surgical/deep wound)     Status: None   Collection Time: 05/24/24  1:51 PM   Specimen: PATH Cytology FNA; Body Fluid  Result Value Ref Range Status   Specimen Description FLUID  Final   Special Requests PANCREATIC FLUID  Final   Gram Stain NO WBC SEEN NO ORGANISMS SEEN   Final   Culture   Final    No growth aerobically or anaerobically. Performed at [facility], [address] [place], [place] [address]    Report Status 05/29/2024 FINAL  Final  Aerobic/Anaerobic Culture w Gram Stain (surgical/deep wound)     Status: None  (Preliminary result)   Collection Time: 05/30/24  4:02 PM   Specimen: PATH Cytology FNA; Body Fluid  Result Value Ref Range Status   Specimen Description ABSCESS  Final   Special Requests PANCREATIC CYSTS  Final   Gram Stain   Final    FEW WBC PRESENT, PREDOMINANTLY PMN NO ORGANISMS SEEN Performed at [facility], [address], [place], [place] [address]    Culture   Final    RARE STREPTOCOCCUS MITIS/ORALIS NO ANAEROBES ISOLATED; CULTURE IN PROGRESS FOR 5 DAYS    Report Status PENDING  Incomplete   Organism ID, Bacteria STREPTOCOCCUS MITIS/ORALIS  Final      Susceptibility   Streptococcus mitis/oralis - MIC*    PENICILLIN 0.12 SENSITIVE Sensitive     CEFTRIAXONE  <=0.12 SENSITIVE Sensitive     LEVOFLOXACIN 2 SENSITIVE Sensitive     VANCOMYCIN  0.25 SENSITIVE Sensitive     * RARE STREPTOCOCCUS MITIS/ORALIS         Radiology Studies: CT ABDOMEN W CONTRAST Result Date: 06/01/2024 CLINICAL DATA:  History of pancreatitis pseudocyst EXAM: CT ABDOMEN WITH CONTRAST TECHNIQUE: Multidetector CT imaging of the abdomen was performed using the standard protocol following bolus administration of intravenous contrast. RADIATION DOSE REDUCTION: This exam was performed according to the departmental dose-optimization program which includes automated exposure control, adjustment of the mA and/or kV according to patient size and/or use of iterative reconstruction technique. CONTRAST:  75mL OMNIPAQUE  IOHEXOL  350 MG/ML SOLN COMPARISON:  CT 05/30/2024, 05/28/2024, 05/21/2024, 04/15/2024 FINDINGS: Lower chest: Lung bases demonstrate small left-sided pleural effusion with dense left lower lobe consolidation, similar to slightly diminished compared to the CT 2 days ago. Hepatobiliary: Hepatic steatosis. Cholecystectomy. No biliary dilatation Pancreas: Mild residual stranding about the tail of the pancreas. Small cystic collection at the tail of the pancreas measuring 16 x 11 mm, previously 22 x 16 mm.  Spleen: Normal in size without focal abnormality. Adrenals/Urinary Tract: Adrenal glands are normal. Kidneys show no hydronephrosis. Stomach/Bowel: Stomach slightly deviated to the patient's right by multiple rim enhancing fluid collection. Bowel is otherwise unremarkable Vascular/Lymphatic: Aortic atherosclerosis. No aneurysm. 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 Other: No free air. Multiple rim enhancing cystic collections with inflammatory stranding in the left upper quadrant, fluid collections are visible between the left diaphragm and spleen, adjacent to the gastric fundus and coursing toward still of pancreas. Slight increased size of the collection between the diaphragm and the spleen, this collection measures approximately 8 x 5.8 cm  on series 3 image 8 and 9.5 by 4.3 cm on coronal series 6, image 44. On the most recent prior, collection measured about 54 x 23 mm on axial images, by 8.6 cm in maximum length. Left subdiaphragmatic collection is contiguous with rim enhancing collection that extends anterior to the gastric fundal region, this measures approximately 5.7 x 2.8 cm on series 3, image 21, previously about 4.4 by 0.9 cm. Collection also extends posterior to the spleen. Multiple additional thin crescent shaped collections within the peripheral left upper and mid quadrant, lateral to the colon, collection measures about 22 mm maximum thickness on coronal series 6, image 68 and extends from just below the diaphragm to the level of the mid abdomen. This also appears slightly increased compared with the most recent prior exam Musculoskeletal: No acute osseous abnormality. 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 IMPRESSION: 1. Multiple rim enhancing fluid collections with inflammatory stranding in the left upper quadrant, suspect for pseudocysts related to pancreatitis (sterility indeterminate on this CT). Dominant  fluid collection between the left diaphragm and spleen is contiguous with fluid that tracks posterior to the spleen and is also contiguous with rim enhancing collection anterior to the gastric fundal region; these collections are increased compared to the most recent prior study from 07/15. Multiple additional thin crescent shaped collections within the peripheral left upper and mid quadrant, lateral to the colon, extending from the level of the diaphragm to the mid abdomen, also suspect for developing pseudocysts and are increased compared to the prior exam. Slight interval decrease in small pseudocyst at the pancreatic tail. 2. Small left-sided pleural effusion with dense left lower lobe consolidation, similar to slightly diminished compared to the CT 2 days ago. 3. Hepatic steatosis. Aortic Atherosclerosis (ICD10-I70.0). Electronically Signed   By: Luke Bun M.D.   On: 06/01/2024 23:36           LOS: 17 days   Time spent= 35 mins    Burgess JAYSON Dare, MD [facility]  If 7PM-7AM, please contact night-coverage  06/03/2024, 1:34 PM

## 2024-06-03 NOTE — Plan of Care (Signed)
   Problem: Health Behavior/Discharge Planning: Goal: Ability to manage health-related needs will improve Outcome: Progressing   Problem: Clinical Measurements: Goal: Ability to maintain clinical measurements within normal limits will improve Outcome: Progressing

## 2024-06-03 NOTE — Progress Notes (Addendum)
 Patient ID: Katrina Lambert, female   DOB: 07-16-1986, 38 y.o.   MRN: [id number]    Progress Note   Subjective   Day # 16 CC; severe acute pancreatitis with large pseudocysts-now status post EUS guided cyst aspiration x 2 with rapid reaccumulation of fluid consistent with pancreatic duct disruption.  IV Rocephin   WBC 14.9/hemoglobin 11.8/hematocrit 38.6 Potassium 4.5/BUN 13/creatinine 1.07 LFTs within normal limits  Patient feels about the same today pain has not progressed, pain is controlled with current narcotics she is tolerating clear liquids and has not vomited Awaiting transfer to [facility]   Objective   Vital signs in last 24 hours: Temp:  [98 F (36.7 C)-98.5 F (36.9 C)] 98 F (36.7 C) (07/19 0816) Pulse Rate:  [84-93] 90 (07/19 0816) Resp:  [15-18] 15 (07/19 0816) BP: (107-131)/(65-81) 107/65 (07/19 0816) SpO2:  [95 %-98 %] 95 % (07/19 0816) Last BM Date : 06/03/24 General:   Young white female in NAD Heart:  Regular rate and rhythm; no murmurs Lungs: Respirations even and unlabored, decreased breath sounds bilateral bases Abdomen:  Soft, ender across the upper abdomen with fullness, no rebound ,normal bowel sounds. Extremities:  Without edema. Neurologic:  Alert and oriented,  grossly normal neurologically. Psych:  Cooperative. Normal mood and affect.  Intake/Output from previous day: 07/18 0701 - 07/19 0700 In: 650 [P.O.:550; IV Piggyback:100] Out: -  Intake/Output this shift: No intake/output data recorded.  Lab Results: Recent Labs    06/01/24 0514 06/02/24 0439 06/03/24 0522  WBC 12.0* 12.9* 14.9*  HGB 10.6* 11.4* 11.8*  HCT 34.8* 36.4 38.6  PLT 571* 540* 578*   BMET Recent Labs    06/01/24 0514 06/02/24 0439 06/03/24 0522  NA 133* 136 134*  K 4.3 4.9 4.5  CL 100 101 96*  CO2 22 26 25   GLUCOSE 114* 109* 141*  BUN 11 11 13   CREATININE 0.90 0.78 1.07*  CALCIUM 8.8* 9.0 9.0   LFT Recent Labs    06/03/24 0522  PROT 6.9  ALBUMIN 3.0*  AST  18  ALT 17  ALKPHOS 93  BILITOT 0.6   PT/INR No results for input(s): LABPROT, INR in the last 72 hours.  Studies/Results: CT ABDOMEN W CONTRAST Result Date: 06/01/2024 CLINICAL DATA:  History of pancreatitis pseudocyst EXAM: CT ABDOMEN WITH CONTRAST TECHNIQUE: Multidetector CT imaging of the abdomen was performed using the standard protocol following bolus administration of intravenous contrast. RADIATION DOSE REDUCTION: This exam was performed according to the departmental dose-optimization program which includes automated exposure control, adjustment of the mA and/or kV according to patient size and/or use of iterative reconstruction technique. CONTRAST:  75mL OMNIPAQUE  IOHEXOL  350 MG/ML SOLN COMPARISON:  CT 05/30/2024, 05/28/2024, 05/21/2024, 04/15/2024 FINDINGS: Lower chest: Lung bases demonstrate small left-sided pleural effusion with dense left lower lobe consolidation, similar to slightly diminished compared to the CT 2 days ago. Hepatobiliary: Hepatic steatosis. Cholecystectomy. No biliary dilatation Pancreas: Mild residual stranding about the tail of the pancreas. Small cystic collection at the tail of the pancreas measuring 16 x 11 mm, previously 22 x 16 mm. Spleen: Normal in size without focal abnormality. Adrenals/Urinary Tract: Adrenal glands are normal. Kidneys show no hydronephrosis. Stomach/Bowel: Stomach slightly deviated to the patient's right by multiple rim enhancing fluid collection. Bowel is otherwise unremarkable Vascular/Lymphatic: Aortic atherosclerosis. No aneurysm. 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 Other: No free air. Multiple rim enhancing cystic collections with inflammatory stranding in the left upper quadrant, fluid collections are visible between the left  diaphragm and spleen, adjacent to the gastric fundus and coursing toward still of pancreas. Slight increased size of the collection between the diaphragm and the spleen, this collection measures  approximately 8 x 5.8 cm on series 3 image 8 and 9.5 by 4.3 cm on coronal series 6, image 44. On the most recent prior, collection measured about 54 x 23 mm on axial images, by 8.6 cm in maximum length. Left subdiaphragmatic collection is contiguous with rim enhancing collection that extends anterior to the gastric fundal region, this measures approximately 5.7 x 2.8 cm on series 3, image 21, previously about 4.4 by 0.9 cm. Collection also extends posterior to the spleen. Multiple additional thin crescent shaped collections within the peripheral left upper and mid quadrant, lateral to the colon, collection measures about 22 mm maximum thickness on coronal series 6, image 68 and extends from just below the diaphragm to the level of the mid abdomen. This also appears slightly increased compared with the most recent prior exam Musculoskeletal: No acute osseous abnormality. 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 IMPRESSION: 1. Multiple rim enhancing fluid collections with inflammatory stranding in the left upper quadrant, suspect for pseudocysts related to pancreatitis (sterility indeterminate on this CT). Dominant fluid collection between the left diaphragm and spleen is contiguous with fluid that tracks posterior to the spleen and is also contiguous with rim enhancing collection anterior to the gastric fundal region; these collections are increased compared to the most recent prior study from 07/15. Multiple additional thin crescent shaped collections within the peripheral left upper and mid quadrant, lateral to the colon, extending from the level of the diaphragm to the mid abdomen, also suspect for developing pseudocysts and are increased compared to the prior exam. Slight interval decrease in small pseudocyst at the pancreatic tail. 2. Small left-sided pleural effusion with dense left lower lobe consolidation, similar to slightly  diminished compared to the CT 2 days ago. 3. Hepatic steatosis. Aortic Atherosclerosis (ICD10-I70.0). Electronically Signed   By: Luke Bun M.D.   On: 06/01/2024 23:36       Assessment / Plan:    #55 38 year old white female status post previous cholecystectomy who has had a very prolonged course over the past 2 months with acute severe pancreatitis complicated by multilobulated large pseudocysts.  She is now status post EUS guided cyst aspiration x 2 over the past week with rapid reaccumulation of fluid post procedures consistent with pancreatic duct disruption  Edward culture from the most recent aspiration on 05/30/2024 growing Streptococcus mitis/oralis  Rocephin  started yesterday  WBC on the rise, monitor has been afebrile  Patient did have increase in pain over the 24 hours after last cyst aspiration which has now stabilized and is controlled with current regimen She is able to tolerate liquids  #2 diabetes mellitus  Plan; continue clear liquids with supplements Pain control Antiemetics as needed IV Rocephin  Continue octreotide  100 mcg SQ every 12 hours  Creatinine bumped today, -restarted lactated Ringer 's at 100 cc/h  Awaiting transfer to [facility] for pancreatic ERCP, and may need additional aspiration/drainage of fluid collections..    Principal Problem:   Pancreatic abscess Active Problems:   Acute pancreatitis   Pleural effusion on left   Left upper quadrant pain   Nausea in adult   Encounter for smoking cessation counseling   Acute pancreatic fluid collection   Malnutrition of moderate degree   Pancreatic pseudocyst   Gastritis without bleeding   Acquired  splenic cyst   Generalized abdominal pain   Pseudocyst of pancreas   Peripancreatic fluid collection   LUQ pain     LOS: 17 days   Rochanda Harpham EsterwoodPA-C  06/03/2024, 10:46 AM

## 2024-06-04 DIAGNOSIS — K859 Acute pancreatitis without necrosis or infection, unspecified: Secondary | ICD-10-CM | POA: Diagnosis not present

## 2024-06-04 LAB — AEROBIC/ANAEROBIC CULTURE W GRAM STAIN (SURGICAL/DEEP WOUND)

## 2024-06-04 LAB — CBC
HCT: 35.7 % — ABNORMAL LOW (ref 36.0–46.0)
Hemoglobin: 10.8 g/dL — ABNORMAL LOW (ref 12.0–15.0)
MCH: 24.4 pg — ABNORMAL LOW (ref 26.0–34.0)
MCHC: 30.3 g/dL (ref 30.0–36.0)
MCV: 80.8 fL (ref 80.0–100.0)
Platelets: 576 K/uL — ABNORMAL HIGH (ref 150–400)
RBC: 4.42 MIL/uL (ref 3.87–5.11)
RDW: 15 % (ref 11.5–15.5)
WBC: 13.9 K/uL — ABNORMAL HIGH (ref 4.0–10.5)
nRBC: 0 % (ref 0.0–0.2)

## 2024-06-04 LAB — MAGNESIUM: Magnesium: 1.8 mg/dL (ref 1.7–2.4)

## 2024-06-04 LAB — COMPREHENSIVE METABOLIC PANEL WITH GFR
ALT: 18 U/L (ref 0–44)
AST: 16 U/L (ref 15–41)
Albumin: 2.9 g/dL — ABNORMAL LOW (ref 3.5–5.0)
Alkaline Phosphatase: 115 U/L (ref 38–126)
Anion gap: 12 (ref 5–15)
BUN: 7 mg/dL (ref 6–20)
CO2: 24 mmol/L (ref 22–32)
Calcium: 9 mg/dL (ref 8.9–10.3)
Chloride: 100 mmol/L (ref 98–111)
Creatinine, Ser: 0.84 mg/dL (ref 0.44–1.00)
GFR, Estimated: >60 mL/min (ref >60)
Glucose, Bld: 156 mg/dL — ABNORMAL HIGH (ref 70–99)
Potassium: 4.3 mmol/L (ref 3.5–5.1)
Sodium: 136 mmol/L (ref 135–145)
Total Bilirubin: 0.4 mg/dL (ref 0.0–1.2)
Total Protein: 6.7 g/dL (ref 6.5–8.1)

## 2024-06-04 LAB — C-REACTIVE PROTEIN: CRP: 19.4 mg/dL — ABNORMAL HIGH (ref <1.0)

## 2024-06-04 LAB — GLUCOSE, CAPILLARY
Glucose-Capillary: 207 mg/dL — ABNORMAL HIGH (ref 70–99)
Glucose-Capillary: 112 mg/dL — ABNORMAL HIGH (ref 70–99)

## 2024-06-04 MED ORDER — SODIUM CHLORIDE 0.9 % IV SOLN: 2.0000 g | INTRAVENOUS | Status: DC

## 2024-06-04 NOTE — Progress Notes (Addendum)
 Inpatient Progress Note     Patient Profile/Chief Complaint  38 year old female with severe, acute idiopathic pancreatitis diagnosed May 2025 complicated by fluid collection and pseudocyst admitted to the hospital 05/17/2024 with LUQ abdominal/back pain, vomiting and worsening fluid collections on imaging.   Status post EUS guided cyst aspiration x 2 (05/24/2024, 05/30/2024) with rapid reaccumulation of fluid consistent with pancreatic duct disruption.  Cyst culture 05/30/2024 growing up to coccus mitis-on ceftriaxone   Awaiting transfer to [facility] for ERCP with stent placement   Interval History   -- Afebrile, on ceftriaxone  -- Endorses ongoing left-sided abdominal and back pain -- Reports nausea but no vomiting -- Tolerating solid diet   Objective   Vital signs in last 24 hours: Temp:  [97.4 F (36.3 C)-98.3 F (36.8 C)] 98.3 F (36.8 C) (07/20 1400) Pulse Rate:  [71-99] 99 (07/20 1400) Resp:  [16-18] 18 (07/20 1400) BP: (112-135)/(54-85) 132/85 (07/20 1400) SpO2:  [95 %-99 %] 98 % (07/20 1400) Last BM Date : 06/01/24 General:    Alert, sitting in bed, no distress Heart:  Regular rate and rhythm; no murmurs Lungs: Respirations even and unlabored, lungs CTA bilaterally Abdomen:  Soft, nondistended, tender to palpation over epigastrium/left upper quadrant normal bowel sounds. Extremities:  Without edema. Neurologic:  Alert and oriented,  grossly normal neurologically. Psych:  Cooperative. Normal mood and affect.  Intake/Output from previous day: 07/19 0701 - 07/20 0700 In: 963 [P.O.:960; I.V.:3] Out: -  Intake/Output this shift: Total I/O In: 720 [P.O.:720] Out: -   Lab Results: Recent Labs    06/02/24 0439 06/03/24 0522 06/04/24 0635  WBC 12.9* 14.9* 13.9*  HGB 11.4* 11.8* 10.8*  HCT 36.4 38.6 35.7*  PLT 540* 578* 576*   BMET Recent Labs    06/02/24 0439 06/03/24 0522 06/04/24 0635  NA 136 134* 136  K 4.9 4.5 4.3  CL 101 96* 100  CO2 26 25 24   GLUCOSE  109* 141* 156*  BUN 11 13 7   CREATININE 0.78 1.07* 0.84  CALCIUM 9.0 9.0 9.0   LFT Recent Labs    06/04/24 0635  PROT 6.7  ALBUMIN 2.9*  AST 16  ALT 18  ALKPHOS 115  BILITOT 0.4   PT/INR No results for input(s): LABPROT, INR in the last 72 hours.  Studies/Results: No results found.   Endoscopic Studies: EUS 05/30/2024   Culture positive for Streptococcus mitis   EUS 05/24/2024    Gastritis, s/p biopsy Gastric biopsies unremarkable STOMACH, BIOPSY:  - Gastric antral and oxyntic mucosa with no specific histopathologic  changes  - Helicobacter pylori-like organisms are not identified on routine HE  stain      Clinical Impression   38 year old female with severe, acute idiopathic pancreatitis diagnosed May 2025 complicated by fluid collection and pseudocyst admitted to the hospital 05/17/2024 with LUQ abdominal/back pain, vomiting and worsening fluid collections on imaging.   She is now status post EUS guided cyst aspiration x 2 over the past week with rapid reaccumulation of fluid post procedures consistent with pancreatic duct disruption; started on octreotide    Culture from the most recent aspiration on 05/30/2024 growing Streptococcus mitis/oralis - on ceftriaxone    Currently afebrile with mild leukocytosis-13,900  Had slight elevation of creatinine yesterday 1.07-now improved to 0.84 after IV fluid hydration.   Continues to experience abdominal pain/back pain and nausea, particular with eating.  Despite this, she has been doing her best to consume a regular diet.  She has been accepted to [facility] as a transfer for  ERCP with stent placement for pancreatic duct disruption +/- cystogastrostomy.   Plan  Continue monitoring temperature curve Continue ceftriaxone  Continue octreotide  100 mg SQ every 12 hours Okay to hold IV fluids now that creatinine improved.  Monitor hydration status, Continue current diet with Ensure Plus shakes Continue pantoprazole  40 mg  orally daily Continue pain control  Continue MiraLAX  as needed for constipation On wait list for transfer to [facility] for ERCP for pancreatic stent  Dr. Shila and Vina Dasen, NP will assume rounding responsibilities 06/05/2024   LOS: 18 days   Katrina Lambert  06/04/2024, 3:21 PM  Katrina Hausen, MD [facility]

## 2024-06-04 NOTE — Progress Notes (Signed)
 Patient is discharging to [facility] bed # [id number].  Gave report to [employer] and answered all his questions. No concerns voiced.  Care Link is set up at 3:30 pm.

## 2024-06-04 NOTE — Discharge Summary (Addendum)
 Physician Discharge Summary  Katrina Lambert FMW:[id number] DOB: [birth year] DOA: 05/17/2024  PCP: [facility]  Admit date: 05/17/2024 Discharge date: 06/04/2024  Admitted From: Home Disposition:  [facility] ([facility])  Code status: Full  Brief/Interim Summary: Brief Narrative:  38 y.o. female with PMH of DM-2, HTN, asthma, IDA, morbid obesity, B12 deficiency, cervical myelopathy, tobacco use disorder and recent hospitalization at [facility] from 5/3-5/6 for idiopathic pancreatitis and at [facility] 5/31/611 for recurrent pancreatitis with pseudocyst as well as hospital-acquired pneumonia complicated by parapneumonic effusion requiring chest tube placement returning to ED with worsening intermittent abdominal pain, nausea and vomiting since her discharge from the hospital. Being managed for abdominal pain in the setting of pancreatic pseudocyst, s/p percutaneous cyst drainage. Repeat CT scan shows likely destructive pancreatic duct and some worsening of fluid collection. Started octreotide  by GI and on IV Roc for Strep Mitis in aspirated fluid from pancrease.  GI recommending to transfer higher level of care.  Patient has been accepted at [facility]   Assessment & Plan:  Principal Problem:   Pancreatic abscess Active Problems:   Acute pancreatitis   Pleural effusion on left   Left upper quadrant pain   Nausea in adult   Encounter for smoking cessation counseling   Acute pancreatic fluid collection   Malnutrition of moderate degree   Pancreatic pseudocyst   Gastritis without bleeding    Acute on chronic pancreatitis with pancreatic pseudocyst -7/3 S/p CT-guided diagnostic aspiration  7/9  S/p EGD with EUS assisted drainage.  Drained 800 mL cyst.    7/15 Repeat EUS 7/15, drained 450cc Continue analgesics as needed for pain.  Despite of multilple decompression she continues to increase in recollection of her pancreatic fluid suggesting Pancreatic duct disruption.  Repeat CT scan shows likely destructive  pancreatic duct and some worsening of fluid collection.  Currently on octreotide  per GI 100 mcg every 12 hours. Awaiting transfer to [facility], excepted by hospitalist team Dr Wallis Commodore  -Most recent aspiration is growing Streptococcus mitis/oralis, started Rocephin .  Alternatively we can use penicillin infusion but for the ease of transfer will continue Rocephin     Type II diabetes mellitus - A1c 7.3. NovoLog  as needed per sliding scale.   Hypokalemia/hypomagnesemia Improved.  Replete as needed   Comorbidities including hypertension, tobacco use disorder on nicotine  patch, obesity    Nutrition Problem: Moderate Malnutrition Etiology: acute illness   Signs/Symptoms: percent weight loss, mild muscle depletion Percent weight loss: 7.7 %     Body mass index is 39.62 kg/m.  (Class II obesity)     DVT prophylaxis: SCDs Start: 05/17/24 2001    Code Status: Full Code Family Communication:   Status is: Inpatient  transfer to [facility]  Subjective: Doing ok. Still off and on having abd pain.   Examination:  General exam: Appears calm and comfortable  Respiratory system: Clear to auscultation. Respiratory effort normal. Cardiovascular system: S1 & S2 heard, RRR. No JVD, murmurs, rubs, gallops or clicks. No pedal edema. Gastrointestinal system: Abdomen is nondistended, soft and nontender. No organomegaly or masses felt. Normal bowel sounds heard. Central nervous system: Alert and oriented. No focal neurological deficits. Extremities: Symmetric 5 x 5 power. Skin: No rashes, lesions or ulcers Psychiatry: Judgement and insight appear normal. Mood & affect appropriate.    Discharge Diagnoses:  Principal Problem:   Pancreatic abscess Active Problems:   Acute pancreatitis   Pleural effusion on left   Left upper quadrant pain   Nausea in adult   Encounter  for smoking cessation counseling   Acute pancreatic fluid collection   Malnutrition of moderate degree    Pancreatic pseudocyst   Gastritis without bleeding   Acquired splenic cyst   Generalized abdominal pain   Pseudocyst of pancreas   Peripancreatic fluid collection   LUQ pain      Discharge Exam: Vitals:   06/04/24 0754 06/04/24 1332  BP: 117/68 135/85  Pulse: 71 99  Resp: 18 18  Temp: (!) 97.4 F (36.3 C) 98.3 F (36.8 C)  SpO2: 95% 96%   Vitals:   06/03/24 2100 06/04/24 0511 06/04/24 0754 06/04/24 1332  BP: 125/77 112/72 117/68 135/85  Pulse: 94 82 71 99  Resp: 18 18 18 18   Temp: 98.3 F (36.8 C) 98.3 F (36.8 C) (!) 97.4 F (36.3 C) 98.3 F (36.8 C)  TempSrc: Oral Oral Oral Oral  SpO2: 99% 97% 95% 96%  Weight:      Height:          Discharge Instructions   Allergies as of 06/04/2024   No Known Allergies      Medication List     TAKE these medications    acetaminophen  500 MG tablet Commonly known as: TYLENOL  Take 500 mg by mouth every 6 (six) hours as needed for mild pain (pain score 1-3).   cefTRIAXone  2 g in sodium chloride  0.9 % 100 mL Inject 2 g into the vein daily.   lisinopril  10 MG tablet Commonly known as: ZESTRIL  Take 10 mg by mouth daily.        Follow-up Information     [facility] Follow up.   Contact information: [address] [place] [address] [phone]                No Known Allergies  You were cared for by a hospitalist during your hospital stay. If you have any questions about your discharge medications or the care you received while you were in the hospital after you are discharged, you can call the unit and asked to speak with the hospitalist on call if the hospitalist that took care of you is not available. Once you are discharged, your primary care physician will handle any further medical issues. Please note that no refills for any discharge medications will be authorized once you are discharged, as it is imperative that you return to your primary care physician (or establish a relationship  with a primary care physician if you do not have one) for your aftercare needs so that they can reassess your need for medications and monitor your lab values.  You were cared for by a hospitalist during your hospital stay. If you have any questions about your discharge medications or the care you received while you were in the hospital after you are discharged, you can call the unit and asked to speak with the hospitalist on call if the hospitalist that took care of you is not available. Once you are discharged, your primary care physician will handle any further medical issues. Please note that NO REFILLS for any discharge medications will be authorized once you are discharged, as it is imperative that you return to your primary care physician (or establish a relationship with a primary care physician if you do not have one) for your aftercare needs so that they can reassess your need for medications and monitor your lab values.  Please request your Prim.MD to go over all Hospital Tests and Procedure/Radiological results at the follow up,  please get all Hospital records sent to your Prim MD by signing hospital release before you go home.  Get CBC, CMP, 2 view Chest X ray checked  by Primary MD during your next visit or SNF MD in 5-7 days ( we routinely change or add medications that can affect your baseline labs and fluid status, therefore we recommend that you get the mentioned basic workup next visit with your PCP, your PCP may decide not to get them or add new tests based on their clinical decision)  On your next visit with your primary care physician please Get Medicines reviewed and adjusted.  If you experience worsening of your admission symptoms, develop shortness of breath, life threatening emergency, suicidal or homicidal thoughts you must seek medical attention immediately by calling 911 or calling your MD immediately  if symptoms less severe.  You Must read complete instructions/literature  along with all the possible adverse reactions/side effects for all the Medicines you take and that have been prescribed to you. Take any new Medicines after you have completely understood and accpet all the possible adverse reactions/side effects.   Do not drive, operate heavy machinery, perform activities at heights, swimming or participation in water  activities or provide baby sitting services if your were admitted for syncope or siezures until you have seen by Primary MD or a Neurologist and advised to do so again.  Do not drive when taking Pain medications.   Procedures/Studies: CT ABDOMEN W CONTRAST Result Date: 06/01/2024 CLINICAL DATA:  History of pancreatitis pseudocyst EXAM: CT ABDOMEN WITH CONTRAST TECHNIQUE: Multidetector CT imaging of the abdomen was performed using the standard protocol following bolus administration of intravenous contrast. RADIATION DOSE REDUCTION: This exam was performed according to the departmental dose-optimization program which includes automated exposure control, adjustment of the mA and/or kV according to patient size and/or use of iterative reconstruction technique. CONTRAST:  75mL OMNIPAQUE  IOHEXOL  350 MG/ML SOLN COMPARISON:  CT 05/30/2024, 05/28/2024, 05/21/2024, 04/15/2024 FINDINGS: Lower chest: Lung bases demonstrate small left-sided pleural effusion with dense left lower lobe consolidation, similar to slightly diminished compared to the CT 2 days ago. Hepatobiliary: Hepatic steatosis. Cholecystectomy. No biliary dilatation Pancreas: Mild residual stranding about the tail of the pancreas. Small cystic collection at the tail of the pancreas measuring 16 x 11 mm, previously 22 x 16 mm. Spleen: Normal in size without focal abnormality. Adrenals/Urinary Tract: Adrenal glands are normal. Kidneys show no hydronephrosis. Stomach/Bowel: Stomach slightly deviated to the patient's right by multiple rim enhancing fluid collection. Bowel is otherwise unremarkable  Vascular/Lymphatic: Aortic atherosclerosis. No aneurysm. 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 Other: No free air. Multiple rim enhancing cystic collections with inflammatory stranding in the left upper quadrant, fluid collections are visible between the left diaphragm and spleen, adjacent to the gastric fundus and coursing toward still of pancreas. Slight increased size of the collection between the diaphragm and the spleen, this collection measures approximately 8 x 5.8 cm on series 3 image 8 and 9.5 by 4.3 cm on coronal series 6, image 44. On the most recent prior, collection measured about 54 x 23 mm on axial images, by 8.6 cm in maximum length. Left subdiaphragmatic collection is contiguous with rim enhancing collection that extends anterior to the gastric fundal region, this measures approximately 5.7 x 2.8 cm on series 3, image 21, previously about 4.4 by 0.9 cm. Collection also extends posterior to the spleen. Multiple additional thin crescent shaped collections within the peripheral left upper and mid quadrant, lateral  to the colon, collection measures about 22 mm maximum thickness on coronal series 6, image 68 and extends from just below the diaphragm to the level of the mid abdomen. This also appears slightly increased compared with the most recent prior exam Musculoskeletal: No acute osseous abnormality. 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 IMPRESSION: 1. Multiple rim enhancing fluid collections with inflammatory stranding in the left upper quadrant, suspect for pseudocysts related to pancreatitis (sterility indeterminate on this CT). Dominant fluid collection between the left diaphragm and spleen is contiguous with fluid that tracks posterior to the spleen and is also contiguous with rim enhancing collection anterior to the gastric fundal region; these collections are increased compared to the most recent prior  study from 07/15. Multiple additional thin crescent shaped collections within the peripheral left upper and mid quadrant, lateral to the colon, extending from the level of the diaphragm to the mid abdomen, also suspect for developing pseudocysts and are increased compared to the prior exam. Slight interval decrease in small pseudocyst at the pancreatic tail. 2. Small left-sided pleural effusion with dense left lower lobe consolidation, similar to slightly diminished compared to the CT 2 days ago. 3. Hepatic steatosis. Aortic Atherosclerosis (ICD10-I70.0). Electronically Signed   By: Luke Bun M.D.   On: 06/01/2024 23:36   CT ABDOMEN W CONTRAST Result Date: 05/30/2024 CLINICAL DATA:  Left upper quadrant pain, history of pancreatic pseudocyst and recent endoscopic transgastric drainage. EXAM: CT ABDOMEN WITH CONTRAST TECHNIQUE: Multidetector CT imaging of the abdomen was performed using the standard protocol following bolus administration of intravenous contrast. RADIATION DOSE REDUCTION: This exam was performed according to the departmental dose-optimization program which includes automated exposure control, adjustment of the mA and/or kV according to patient size and/or use of iterative reconstruction technique. CONTRAST:  75mL OMNIPAQUE  IOHEXOL  350 MG/ML SOLN COMPARISON:  05/28/2024 FINDINGS: Lower chest: Right lung base is within normal limits. Left lung base shows small pleural effusion and left lower lobe consolidation similar to that seen on the prior exam. Hepatobiliary: No focal liver abnormality is seen. Status post cholecystectomy. No biliary dilatation. Pancreas: Pancreas is again well visualized. Small pseudocyst is noted within the tail of the pancreas. The previously seen extensive multiloculated fluid collection in the left upper quadrant has nearly completely resolved following cyst drainage VA transgastric approach. There remains some inflammatory change and collapsed cyst walls adjacent to  the stomach. A small curvilinear collection is noted posterior to the spleen beneath the hemidiaphragm but significantly decreased now measuring approximately 13 mm in thickness decreased from 40 mm in thickness. No other sizable component of the previously seen collection is noted. A small extraluminal collection is noted best seen on image number 37 of series 5 lateral to the splenic flexure measuring approximately 3.3 x 2.2 cm. This is stable in appearance from the prior exam and likely represents a separate cyst. Spleen: Spleen is within normal limits. Adrenals/Urinary Tract: Adrenal glands are within normal limits. Kidneys demonstrate a normal enhancement pattern. Stomach/Bowel: Stomach is decompressed. The degree of mass effect upon the stomach has resolved following cyst drainage. Visualized large and small bowel is within normal limits. Vascular/Lymphatic: Aortic atherosclerosis. No enlarged abdominal or pelvic lymph nodes. Other: None Musculoskeletal: No acute or significant osseous findings. IMPRESSION: There is been significant decompression of the multiloculated cystic structure in the left upper quadrant following transgastric cyst drainage. There remains a E tiny component interposed between the stomach and spleen within multiple collapsed  walls as well as a small curvilinear collection posterior to the spleen which is significantly reduced in size. A separate small cystic lesion is noted lateral to the splenic flexure as described. This is stable from the prior exam. Persistent pseudocyst in the pancreatic tail. No other focal abnormality is noted. Electronically Signed   By: Oneil Devonshire M.D.   On: 05/30/2024 23:10   CT ABDOMEN PELVIS W CONTRAST Result Date: 05/28/2024 CLINICAL DATA:  Severe pancreatitis, pancreatic pseudocyst EXAM: CT ABDOMEN AND PELVIS WITH CONTRAST TECHNIQUE: Multidetector CT imaging of the abdomen and pelvis was performed using the standard protocol following bolus  administration of intravenous contrast. RADIATION DOSE REDUCTION: This exam was performed according to the departmental dose-optimization program which includes automated exposure control, adjustment of the mA and/or kV according to patient size and/or use of iterative reconstruction technique. CONTRAST:  75mL OMNIPAQUE  IOHEXOL  350 MG/ML SOLN COMPARISON:  05/21/2024 FINDINGS: Lower chest: Severe elevation of the left hemidiaphragm left upper quadrant incompletely imaged. Small left pleural effusion associated atelectasis or consolidation. Hepatobiliary: No focal liver abnormality is seen. Hepatomegaly, maximum coronal span 23.4 cm. Hepatic steatosis. Status post cholecystectomy. No biliary dilatation. Pancreas: Unchanged appearance of the pancreas, with a cystic lesion within the pancreatic tail measuring 2.2 x 1.6 cm (series 3, image 28). Similar inflammatory fat stranding and fluid in the left upper quadrant. Spleen: Normal in size without significant abnormality. Adrenals/Urinary Tract: Adrenal glands are unremarkable. Kidneys are normal, without renal calculi, solid lesion, or hydronephrosis. Bladder is unremarkable. Stomach/Bowel: Stomach is within normal limits. Appendix appears normal. No evidence of bowel wall thickening, distention, or inflammatory changes. Vascular/Lymphatic: Aortic atherosclerosis. No enlarged abdominal or pelvic lymph nodes. Reproductive: No mass or other significant abnormality. IUD present in the fundal endometrial cavity. Other: No abdominal wall hernia or abnormality. Large, complex serpiginous multiloculated fluid collection occupying the majority of the left upper quadrant, about the spleen, gastric fundus, and tip of the pancreas, largest component immediately under the diaphragm measuring 13.4 x 8.4 cm, not significantly changed (series 6, image 6, series 6, image 63). Very slightly diminished volume of free and loculated in the ventral left upper quadrant (series 3, image 61).  Musculoskeletal: No acute or significant osseous findings. IMPRESSION: 1. Large, complex serpiginous multiloculated fluid collection occupying the majority of the left upper quadrant, about the spleen, gastric fundus, and tip of the pancreas, largest component immediately under the diaphragm measuring 13.4 x 8.4 cm, not significantly changed. Very slightly diminished volume of free and loculated fluid in the ventral left upper quadrant. Findings remain consistent with a large pancreatic pseudocyst. 2. Unchanged appearance of the pancreas, with a pseudocyst within the pancreatic tail measuring 2.2 x 1.6 cm. Similar inflammatory fat stranding and fluid in the left upper quadrant. 3. Severe elevation of the left hemidiaphragm, left upper quadrant incompletely imaged. Small left pleural effusion associated atelectasis or consolidation. 4. Hepatomegaly and hepatic steatosis. Aortic Atherosclerosis (ICD10-I70.0). Electronically Signed   By: Marolyn JONETTA Jaksch M.D.   On: 05/28/2024 12:24   CT ABDOMEN PELVIS W CONTRAST Result Date: 05/21/2024 CLINICAL DATA:  38 year old female with history of pancreatic pseudocyst status post image guided aspiration on 05/18/2024. EXAM: CT ABDOMEN AND PELVIS WITH CONTRAST TECHNIQUE: Multidetector CT imaging of the abdomen and pelvis was performed using the standard protocol following bolus administration of intravenous contrast. RADIATION DOSE REDUCTION: This exam was performed according to the departmental dose-optimization program which includes automated exposure control, adjustment of the mA and/or kV according to patient size and/or use  of iterative reconstruction technique. CONTRAST:  75mL OMNIPAQUE  IOHEXOL  350 MG/ML SOLN COMPARISON:  05/17/2024 FINDINGS: Lower chest: Similar appearing small left pleural effusion with associated left basilar passive atelectasis. Hepatobiliary: No focal liver abnormality is seen. Status post cholecystectomy. No biliary dilatation. Pancreas: Similar normal  appearing head and body of the pancreas with large multiloculated cystic lesion extending from the pancreatic tail superiorly to the greater curvature of the stomach and perisplenic regions. The collection is overall similar to most recent comparison CT. Spleen: Splenic parenchyma is normal in size enhancement. Again seen is a large subcapsular appearing splenic cystic lesion with peripheral enhancement arising from the superior aspect which measures up to approximately 13.6 x 8.0 cm in maximum axial dimensions. Adrenals/Urinary Tract: Adrenal glands are unremarkable. Kidneys are normal, without renal calculi, focal lesion, or hydronephrosis. Bladder is unremarkable. Stomach/Bowel: Stomach is within normal limits, nearly collapsed extrinsic compression from neighboring pancreatic cysts. Appendix appears normal. No evidence of bowel wall thickening, distention, or inflammatory changes. Vascular/Lymphatic: No significant vascular findings are present. No enlarged abdominal or pelvic lymph nodes. Reproductive: Intrauterine device in place in unchanged position. Uterus and bilateral adnexa are otherwise unremarkable. Other: Interval development of crescentic small volume ascites in the left upper quadrant. Musculoskeletal: No acute osseous abnormality. Similar appearing advanced discogenic degenerative change at L3-L4 with unchanged anterolisthesis at this level. IMPRESSION: 1. Similar appearing large multiloculated cystic lesion extending from the pancreatic tail superiorly to the greater curvature of the stomach and perisplenic regions. The collection is overall similar to most recent comparison CT. 2. Interval development of crescentic small volume ascites in the left upper quadrant. Suspect pancreatic pseudocyst contents after recent instrumentation. 3. Similar appearing small left pleural effusion with associated left basilar passive atelectasis. Ester Sides, MD [facility]  [facility] Electronically Signed   By: Ester Sides M.D.   On: 05/21/2024 21:23   DG Abd 2 Views Result Date: 05/18/2024 CLINICAL DATA:  [id number] Acute generalized abdominal pain [id number] EXAM: ABDOMEN - 2 VIEW COMPARISON:  May 17, 2024, April 19, 2024 FINDINGS: The study is degraded by patient's body habitus. Nonobstructive bowel gas pattern. No pneumoperitoneum. No organomegaly or radiopaque calculi. No acute fracture or destructive lesion. Elevation of the left hemidiaphragm again partially visualized. The right lung bases otherwise clear.Cholecystectomy clips and intrauterine device overlying the pelvis. Multilevel degenerative disc disease of the spine. IMPRESSION: Nonobstructive bowel gas pattern. Electronically Signed   By: Rogelia Myers M.D.   On: 05/18/2024 17:48   CT GUIDED NEEDLE PLACEMENT Result Date: 05/18/2024 INDICATION: [id number] Pancreatic pseudocyst [id number]. Diagnostic aspiration requested. EXAM: CT-GUIDED PERIPANCREATIC FLUID COLLECTION ASPIRATION COMPARISON:  CT CAP, 05/17/2024.  MR ABDOMEN, 05/17/2024. MEDICATIONS: The patient is currently admitted to the hospital and receiving intravenous antibiotics. The antibiotics were administered within an appropriate time frame prior to the initiation of the procedure. ANESTHESIA/SEDATION: Local anesthetic was administered. CONTRAST:  None FLUOROSCOPY TIME:  CT dose; 977 mGycm COMPLICATIONS: None immediate. PROCEDURE: RADIATION DOSE REDUCTION: This exam was performed according to the departmental dose-optimization program which includes automated exposure control, adjustment of the mA and/or kV according to patient size and/or use of iterative reconstruction technique. Informed written consent was obtained from the patient after a discussion of the risks, benefits and alternatives to treatment. The patient was placed supine on the CT gantry and a pre procedural CT was performed re-demonstrating the known fluid collection within the LEFT upper  quadrant. The procedure was planned. A timeout was performed prior to the initiation of  the procedure. The LEFT upper quadrant was prepped and draped in the usual sterile fashion. The overlying soft tissues were anesthetized with 1% lidocaine  with epinephrine . Appropriate trajectory was planned with the use of a 22 gauge spinal needle. An 18 gauge trocar needle was advanced into the collection and appropriate positioning was confirmed with a limited CT scan. 100 mL of tan brown fluid was aspirated. The needle was removed then a dressing was placed. The patient tolerated the procedure well without immediate post procedural complication. IMPRESSION: Successful CT-guided diagnostic aspiration of a peripancreatic fluid collection yielding 100 mL of 10 brown fluid. Samples were sent to the laboratory as requested by the ordering clinical team. Thom Hall, MD [facility] Radiology Electronically Signed   By: Thom Hall M.D.   On: 05/18/2024 17:14   MR ABDOMEN MRCP W WO CONTAST Result Date: 05/18/2024 CLINICAL DATA:  ACUTE PANCREATITIS. EXAM: MRI ABDOMEN WITHOUT AND WITH CONTRAST (INCLUDING MRCP) TECHNIQUE: Multiplanar multisequence MR imaging of the abdomen was performed both before and after the administration of intravenous contrast. Heavily T2-weighted images of the biliary and pancreatic ducts were obtained, and three-dimensional MRCP images were rendered by post processing. CONTRAST:  10mL GADAVIST  GADOBUTROL  1 MMOL/ML IV SOLN COMPARISON:  CT SCAN 05/17/2024 FINDINGS: Lower chest: Left base atelectasis with small left pleural effusion. Hepatobiliary: No focal abnormality identified in the liver. No substantial intrahepatic biliary duct dilatation. Gallbladder surgically absent. Common duct measures 11 mm in the hepatoduodenal ligament. Common bile duct measures 6 mm diameter just proximal to the ampulla. Pancreas: Relatively unremarkable appearance of the  pancreatic head. Pancreatic parenchyma appears to enhance throughout with no definite MR features of pancreatic necrosis. There is edema in the body and tail of pancreas. No main duct dilatation. As seen on the recent CT there is a large complex cystic process in the left upper quadrant involving the wall of the proximal stomach, pancreatic tail, subdiaphragmatic space, and dome of the spleen. Spleen: Irregularity of the splenic capsule in the posterior dome of the spleen suggest disruption by the complex cystic process. Adrenals/Urinary Tract: No adrenal nodule or mass. Kidneys unremarkable. Stomach/Bowel: Complex fluid dissects in the wall of the proximal stomach. Duodenum unremarkable. No small bowel or colonic dilatation within the visualized abdomen. Vascular/Lymphatic: Portal vein and superior mesenteric vein are patent. 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 Celiac axis and SMA are patent. Splenic artery appears to be patent. Upper normal lymph nodes in the hepatoduodenal ligament. Other: Irregular somewhat tubular rimr enhancing fluid collection in left paracolic gutter measures on the order of 10.4 x 2.5 x 4.1 cm Musculoskeletal: No focal suspicious marrow enhancement within the visualized bony anatomy. Advanced degenerative changes are seen at the level of the L3-4 disc space. IMPRESSION: 1. Large complex cystic process in the left upper quadrant involving the wall of the proximal stomach, pancreatic tail, subdiaphragmatic space, and dome of the spleen. Imaging features are compatible with sequelae of pancreatitis with pseudocyst formation. Dominant collection in the subdiaphragmatic space measures on the order of 12.5 x 12.7 x 7.9 cm. Superinfection not excluded by imaging. No definite MR features of pancreatic necrosis. 2. Status post cholecystectomy with mild dilatation of the common duct in the hepatoduodenal ligament, likely related to postcholecystectomy state. Common bile duct  nondilated just proximal to the ampulla. No choledocholithiasis. 3. Irregular somewhat tubular rim enhancing fluid collection in the left paracolic gutter measures on the order of 10.4 x 2.5 x 4.1 cm. Imaging  features are compatible with pseudocyst. Superinfection not excluded by imaging. 4. 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 5. Left base atelectasis with small left pleural effusion. Electronically Signed   By: Camellia Candle M.D.   On: 05/18/2024 05:32   MR 3D Recon At Scanner Result Date: 05/18/2024 CLINICAL DATA:  ACUTE PANCREATITIS. EXAM: MRI ABDOMEN WITHOUT AND WITH CONTRAST (INCLUDING MRCP) TECHNIQUE: Multiplanar multisequence MR imaging of the abdomen was performed both before and after the administration of intravenous contrast. Heavily T2-weighted images of the biliary and pancreatic ducts were obtained, and three-dimensional MRCP images were rendered by post processing. CONTRAST:  10mL GADAVIST  GADOBUTROL  1 MMOL/ML IV SOLN COMPARISON:  CT SCAN 05/17/2024 FINDINGS: Lower chest: Left base atelectasis with small left pleural effusion. Hepatobiliary: No focal abnormality identified in the liver. No substantial intrahepatic biliary duct dilatation. Gallbladder surgically absent. Common duct measures 11 mm in the hepatoduodenal ligament. Common bile duct measures 6 mm diameter just proximal to the ampulla. Pancreas: Relatively unremarkable appearance of the pancreatic head. Pancreatic parenchyma appears to enhance throughout with no definite MR features of pancreatic necrosis. There is edema in the body and tail of pancreas. No main duct dilatation. As seen on the recent CT there is a large complex cystic process in the left upper quadrant involving the wall of the proximal stomach, pancreatic tail, subdiaphragmatic space, and dome of the spleen. Spleen: Irregularity of the splenic capsule in the posterior dome of the spleen suggest disruption by the complex cystic process. Adrenals/Urinary  Tract: No adrenal nodule or mass. Kidneys unremarkable. Stomach/Bowel: Complex fluid dissects in the wall of the proximal stomach. Duodenum unremarkable. No small bowel or colonic dilatation within the visualized abdomen. Vascular/Lymphatic: Portal vein and superior mesenteric vein are patent. 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 Celiac axis and SMA are patent. Splenic artery appears to be patent. Upper normal lymph nodes in the hepatoduodenal ligament. Other: Irregular somewhat tubular rimr enhancing fluid collection in left paracolic gutter measures on the order of 10.4 x 2.5 x 4.1 cm Musculoskeletal: No focal suspicious marrow enhancement within the visualized bony anatomy. Advanced degenerative changes are seen at the level of the L3-4 disc space. IMPRESSION: 1. Large complex cystic process in the left upper quadrant involving the wall of the proximal stomach, pancreatic tail, subdiaphragmatic space, and dome of the spleen. Imaging features are compatible with sequelae of pancreatitis with pseudocyst formation. Dominant collection in the subdiaphragmatic space measures on the order of 12.5 x 12.7 x 7.9 cm. Superinfection not excluded by imaging. No definite MR features of pancreatic necrosis. 2. Status post cholecystectomy with mild dilatation of the common duct in the hepatoduodenal ligament, likely related to postcholecystectomy state. Common bile duct nondilated just proximal to the ampulla. No choledocholithiasis. 3. Irregular somewhat tubular rim enhancing fluid collection in the left paracolic gutter measures on the order of 10.4 x 2.5 x 4.1 cm. Imaging features are compatible with pseudocyst. Superinfection not excluded by imaging. 4. 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 5. Left base atelectasis with small left pleural effusion. Electronically Signed   By: Camellia Candle M.D.   On: 05/18/2024 05:32   CT CHEST ABDOMEN PELVIS W CONTRAST Result Date: 05/17/2024 CLINICAL  DATA:  Sepsis EXAM: CT CHEST, ABDOMEN, AND PELVIS WITH CONTRAST TECHNIQUE: Multidetector CT imaging of the chest, abdomen and pelvis was performed following the standard protocol during bolus administration of intravenous contrast. RADIATION DOSE REDUCTION: This exam was performed according to the departmental dose-optimization program  which includes automated exposure control, adjustment of the mA and/or kV according to patient size and/or use of iterative reconstruction technique. CONTRAST:  65mL OMNIPAQUE  IOHEXOL  350 MG/ML SOLN COMPARISON:  CT abdomen pelvis 04/25/2024 FINDINGS: CT CHEST FINDINGS Cardiovascular: Normal heart size. No significant pericardial effusion. The thoracic aorta is normal in caliber. At least single vessel atherosclerotic plaque of the thoracic aorta. Mild coronary artery calcifications. Mediastinum/Nodes: No enlarged mediastinal, hilar, or axillary lymph nodes. Thyroid gland, trachea, and esophagus demonstrate no significant findings. Lungs/Pleura: Left lower lobe atelectasis. No pulmonary nodule. No pulmonary mass. Slight interval increase of trace left pleural effusion. No pneumothorax. Musculoskeletal: No chest wall abnormality. No suspicious lytic or blastic osseous lesions. No acute displaced fracture. Multilevel mild degenerative changes of the spine. Partially visualized cervicothoracic spine demonstrates surgical hardware. CT ABDOMEN PELVIS FINDINGS Hepatobiliary: No focal liver abnormality. Status post cholecystectomy. No biliary dilatation. Pancreas: Interval increase in size of a large lobulated and multiloculated left upper quadrant cystic lesion arising from the pancreatic tail that again appears to be inseparable from the greater curvature of the stomach and the spleen. Finding demonstrates thick enhancing walls. The largest component measures 15 x 10.5 cm (from 10 x 6 cm). The cystic lesion abuts to the left hemidiaphragm and pushes the hemidiaphragm up. Slightly hazy  pancreatic tail contour with associated peripancreatic fat stranding. No main pancreatic ductal dilatation. Spleen: Interval concave scalloping of the splenic capsule with suggestion of invasion of the left upper quadrant cystic lesion. Interval development of adjacent punctate hypodensity (5:50). Spleen is normal in size. Adrenals/Urinary Tract: No adrenal nodule bilaterally. Bilateral kidneys enhance symmetrically. No hydronephrosis. No hydroureter. The urinary bladder is unremarkable. Stomach/Bowel: Stomach is within normal limits. No evidence of bowel wall thickening or dilatation. Vague fat stranding along the splenic flexure likely reactive changes with left upper quadrant abscess adjacent but not be inseparable (5:92). Appendix appears normal. Vascular/Lymphatic: No abdominal aorta or iliac aneurysm. Mild atherosclerotic plaque of the aorta and its branches. No abdominal, pelvic, or inguinal lymphadenopathy. Reproductive: T-shaped intrauterine device in appropriate position. Uterus and bilateral adnexa are unremarkable. Other: No intraperitoneal free fluid. No intraperitoneal free gas. No organized fluid collection. Musculoskeletal: No abdominal wall hernia or abnormality. No suspicious lytic or blastic osseous lesions. No acute displaced fracture. Multilevel degenerative changes of the spine. Stable grade 2 anterolisthesis of L3 on L4 with endplate sclerosis and intervertebral disc space vacuum phenomenon. IMPRESSION: 1. Persistent acute pancreatitis with interval increase in size of a (up to 15 x 10.5 cm on axial imaging) pancreatic tail abscess that extends along the left upper quadrant is inseparable from the greater curvature of the stomach, spleen, inferior aspect of the left hemidiaphragm. Findings suggestive of invasion into the splenic capsule with possible adjacent second abscess forming within the splenic parenchyma-too small to characterize, recommend attention on follow-up. When the patient is  clinically stable and able to follow directions and hold their breath (preferably as an outpatient) further evaluation with dedicated abdominal MRI pancreatic protocol should be considered. 2. Slight interval increase of trace left pleural effusion. 3. Persistent elevated left hemidiaphragm with left base atelectasis. 4.  Aortic Atherosclerosis (ICD10-I70.0). 5. shaped intrauterine device in appropriate position. 6. 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 Electronically Signed   By: Morgane  Naveau M.D.   On: 05/17/2024 17:16     The results of significant diagnostics from this hospitalization (including imaging, microbiology, ancillary and laboratory) are listed below for reference.  Microbiology: Recent Results (from the past 240 hours)  Aerobic/Anaerobic Culture w Gram Stain (surgical/deep wound)     Status: None (Preliminary result)   Collection Time: 05/30/24  4:02 PM   Specimen: PATH Cytology FNA; Body Fluid  Result Value Ref Range Status   Specimen Description ABSCESS  Final   Special Requests PANCREATIC CYSTS  Final   Gram Stain   Final    FEW WBC PRESENT, PREDOMINANTLY PMN NO ORGANISMS SEEN Performed at [facility], [address], [place], [place] [address]    Culture   Final    RARE STREPTOCOCCUS MITIS/ORALIS NO ANAEROBES ISOLATED; CULTURE IN PROGRESS FOR 5 DAYS    Report Status PENDING  Incomplete   Organism ID, Bacteria STREPTOCOCCUS MITIS/ORALIS  Final      Susceptibility   Streptococcus mitis/oralis - MIC*    PENICILLIN 0.12 SENSITIVE Sensitive     CEFTRIAXONE  <=0.12 SENSITIVE Sensitive     LEVOFLOXACIN 2 SENSITIVE Sensitive     VANCOMYCIN  0.25 SENSITIVE Sensitive     * RARE STREPTOCOCCUS MITIS/ORALIS     Labs: BNP (last 3 results) No results for input(s): BNP in the last 8760 hours. Basic Metabolic Panel: Recent Labs  Lab 05/31/24 0450 06/01/24 0514 06/02/24 0439 06/03/24 0522 06/04/24 0635  NA  137 133* 136 134* 136  K 4.5 4.3 4.9 4.5 4.3  CL 101 100 101 96* 100  CO2 21* 22 26 25 24   GLUCOSE 107* 114* 109* 141* 156*  BUN 9 11 11 13 7   CREATININE 0.75 0.90 0.78 1.07* 0.84  CALCIUM 8.9 8.8* 9.0 9.0 9.0  MG 1.7 1.7 1.8 2.0 1.8   Liver Function Tests: Recent Labs  Lab 05/31/24 0450 06/01/24 0514 06/02/24 0439 06/03/24 0522 06/04/24 0635  AST 17 15 16 18 16   ALT 11 15 12 17 18   ALKPHOS 68 76 72 93 115  BILITOT 0.4 0.4 0.6 0.6 0.4  PROT 6.0* 6.2* 6.0* 6.9 6.7  ALBUMIN 2.4* 2.7* 2.6* 3.0* 2.9*   No results for input(s): LIPASE, AMYLASE in the last 168 hours. No results for input(s): AMMONIA in the last 168 hours. CBC: Recent Labs  Lab 05/31/24 0450 06/01/24 0514 06/02/24 0439 06/03/24 0522 06/04/24 0635  WBC 10.8* 12.0* 12.9* 14.9* 13.9*  HGB 10.3* 10.6* 11.4* 11.8* 10.8*  HCT 33.2* 34.8* 36.4 38.6 35.7*  MCV 79.6* 81.3 80.2 80.9 80.8  PLT 503* 571* 540* 578* 576*   Cardiac Enzymes: No results for input(s): CKTOTAL, CKMB, CKMBINDEX, TROPONINI in the last 168 hours. BNP: Invalid input(s): POCBNP CBG: Recent Labs  Lab 06/03/24 1228 06/03/24 1721 06/03/24 2150 06/04/24 0754 06/04/24 1135  GLUCAP 110* 158* 107* 207* 112*   D-Dimer No results for input(s): DDIMER in the last 72 hours. Hgb A1c No results for input(s): HGBA1C in the last 72 hours. Lipid Profile No results for input(s): CHOL, HDL, LDLCALC, TRIG, CHOLHDL, LDLDIRECT in the last 72 hours. Thyroid function studies No results for input(s): TSH, T4TOTAL, T3FREE, THYROIDAB in the last 72 hours.  Invalid input(s): FREET3 Anemia work up No results for input(s): VITAMINB12, FOLATE, FERRITIN, TIBC, IRON , RETICCTPCT in the last 72 hours. Urinalysis    Component Value Date/Time   COLORURINE YELLOW 05/17/2024 1302   APPEARANCEUR CLEAR 05/17/2024 1302   LABSPEC 1.011 05/17/2024 1302   PHURINE 6.0 05/17/2024 1302   GLUCOSEU NEGATIVE 05/17/2024  1302   HGBUR SMALL (A) 05/17/2024 1302   BILIRUBINUR NEGATIVE 05/17/2024 1302   KETONESUR NEGATIVE 05/17/2024 1302   PROTEINUR 30 (  A) 05/17/2024 1302   UROBILINOGEN 0.2 12/20/2009 2301   NITRITE NEGATIVE 05/17/2024 1302   LEUKOCYTESUR NEGATIVE 05/17/2024 1302   Sepsis Labs Recent Labs  Lab 06/01/24 0514 06/02/24 0439 06/03/24 0522 06/04/24 0635  WBC 12.0* 12.9* 14.9* 13.9*   Microbiology Recent Results (from the past 240 hours)  Aerobic/Anaerobic Culture w Gram Stain (surgical/deep wound)     Status: None (Preliminary result)   Collection Time: 05/30/24  4:02 PM   Specimen: PATH Cytology FNA; Body Fluid  Result Value Ref Range Status   Specimen Description ABSCESS  Final   Special Requests PANCREATIC CYSTS  Final   Gram Stain   Final    FEW WBC PRESENT, PREDOMINANTLY PMN NO ORGANISMS SEEN Performed at [facility], [address], [place], [place] [address]    Culture   Final    RARE STREPTOCOCCUS MITIS/ORALIS NO ANAEROBES ISOLATED; CULTURE IN PROGRESS FOR 5 DAYS    Report Status PENDING  Incomplete   Organism ID, Bacteria STREPTOCOCCUS MITIS/ORALIS  Final      Susceptibility   Streptococcus mitis/oralis - MIC*    PENICILLIN 0.12 SENSITIVE Sensitive     CEFTRIAXONE  <=0.12 SENSITIVE Sensitive     LEVOFLOXACIN 2 SENSITIVE Sensitive     VANCOMYCIN  0.25 SENSITIVE Sensitive     * RARE STREPTOCOCCUS MITIS/ORALIS     Time coordinating discharge:  I have spent 35 minutes face to face with the patient and on the ward discussing the patients care, assessment, plan and disposition with other care givers. >50% of the time was devoted counseling the patient about the risks and benefits of treatment/Discharge disposition and coordinating care.   SIGNED:   Burgess JAYSON Dare, MD  [facility] 06/04/2024, 2:01 PM   If 7PM-7AM, please contact night-coverage

## 2024-06-04 NOTE — Progress Notes (Signed)
 Patient discharged to [facility] via [facility].  Peripheral IV is intact at discharge and is functional.

## 2024-06-04 NOTE — Progress Notes (Signed)
 PROGRESS NOTE    Katrina Lambert  FMW:[id number] DOB: 06/05/1986 DOA: 05/17/2024 PCP: [facility]    Brief Narrative:  38 y.o. female with PMH of DM-2, HTN, asthma, IDA, morbid obesity, B12 deficiency, cervical myelopathy, tobacco use disorder and recent hospitalization at [facility] from 5/3-5/6 for idiopathic pancreatitis and at [facility] 5/31/611 for recurrent pancreatitis with pseudocyst as well as hospital-acquired pneumonia complicated by parapneumonic effusion requiring chest tube placement returning to ED with worsening intermittent abdominal pain, nausea and vomiting since her discharge from the hospital. Being managed for abdominal pain in the setting of pancreatic pseudocyst, s/p percutaneous cyst drainage. Repeat CT scan shows likely destructive pancreatic duct and some worsening of fluid collection.  GI recommending to transfer higher level of care.  Patient has been accepted at [facility], awaiting transfer.   Assessment & Plan:  Principal Problem:   Pancreatic abscess Active Problems:   Acute pancreatitis   Pleural effusion on left   Left upper quadrant pain   Nausea in adult   Encounter for smoking cessation counseling   Acute pancreatic fluid collection   Malnutrition of moderate degree   Pancreatic pseudocyst   Gastritis without bleeding    Acute on chronic pancreatitis with pancreatic pseudocyst -7/3 S/p CT-guided diagnostic aspiration  7/9  S/p EGD with EUS assisted drainage.  Drained 800 mL cyst.    7/15 Repeat EUS 7/15, drained 450cc Continue analgesics as needed for pain.  Despite of multilple decompression she continues to increase in recollection of her pancreatic fluid suggesting Pancreatic duct disruption.  Repeat CT scan shows likely destructive pancreatic duct and some worsening of fluid collection.  Currently on octreotide  per GI 100 mcg every 12 hours. Awaiting transfer to [facility], excepted by hospitalist team Dr Wallis Commodore  -Most recent aspiration  is growing Streptococcus mitis/oralis, started Rocephin .  Alternatively we can use penicillin infusion but for the ease of transfer will continue Rocephin     Type II diabetes mellitus - A1c 7.3. NovoLog  as needed per sliding scale.   Hypokalemia/hypomagnesemia Improved.  Replete as needed   Comorbidities including hypertension, tobacco use disorder on nicotine  patch, obesity    Nutrition Problem: Moderate Malnutrition Etiology: acute illness   Signs/Symptoms: percent weight loss, mild muscle depletion Percent weight loss: 7.7 %     Body mass index is 39.62 kg/m.  (Class II obesity)     DVT prophylaxis: SCDs Start: 05/17/24 2001    Code Status: Full Code Family Communication:   Status is: Inpatient Awaiting transfer to [facility]  Subjective: Doing ok. Still off and on having abd pain.   Examination:  General exam: Appears calm and comfortable  Respiratory system: Clear to auscultation. Respiratory effort normal. Cardiovascular system: S1 & S2 heard, RRR. No JVD, murmurs, rubs, gallops or clicks. No pedal edema. Gastrointestinal system: Abdomen is nondistended, soft and nontender. No organomegaly or masses felt. Normal bowel sounds heard. Central nervous system: Alert and oriented. No focal neurological deficits. Extremities: Symmetric 5 x 5 power. Skin: No rashes, lesions or ulcers Psychiatry: Judgement and insight appear normal. Mood & affect appropriate.                Diet Orders (From admission, onward)     Start     Ordered   06/02/24 1416  Diet full liquid Room service appropriate? Yes; Fluid consistency: Thin  Diet effective now       Comments: With supplements  Question Answer Comment  Room service appropriate? Yes   Fluid  consistency: Thin      06/02/24 1415            Objective: Vitals:   06/03/24 1800 06/03/24 2100 06/04/24 0511 06/04/24 0754  BP: (!) 113/54 125/77 112/72 117/68  Pulse: 88 94 82 71  Resp: 16 18 18 18    Temp: 98.1 F (36.7 C) 98.3 F (36.8 C) 98.3 F (36.8 C) (!) 97.4 F (36.3 C)  TempSrc: Oral Oral Oral Oral  SpO2: 98% 99% 97% 95%  Weight:      Height:        Intake/Output Summary (Last 24 hours) at 06/04/2024 1101 Last data filed at 06/04/2024 0900 Gross per 24 hour  Intake 1203 ml  Output --  Net 1203 ml   Filed Weights   05/24/24 1212 05/25/24 0800 05/30/24 1427  Weight: 108.9 kg 108 kg 108 kg    Scheduled Meds:  feeding supplement  237 mL Oral BID BM   insulin  aspart  0-9 Units Subcutaneous TID WC   multivitamin with minerals  1 tablet Oral Daily   nicotine   14 mg Transdermal Daily   octreotide   100 mcg Subcutaneous Q12H   pantoprazole   40 mg Oral Q1200   sodium chloride  flush  3 mL Intravenous Q12H   Continuous Infusions:  cefTRIAXone  (ROCEPHIN )  IV 2 g (06/03/24 1521)   lactated ringers  100 mL/hr at 06/04/24 0109    Nutritional status Signs/Symptoms: percent weight loss, mild muscle depletion Percent weight loss: 7.7 % Interventions: Ensure Enlive (each supplement provides 350kcal and 20 grams of protein), MVI, Magic cup, Liberalize Diet Body mass index is 39.62 kg/m.  Data Reviewed:   CBC: Recent Labs  Lab 05/31/24 0450 06/01/24 0514 06/02/24 0439 06/03/24 0522 06/04/24 0635  WBC 10.8* 12.0* 12.9* 14.9* 13.9*  HGB 10.3* 10.6* 11.4* 11.8* 10.8*  HCT 33.2* 34.8* 36.4 38.6 35.7*  MCV 79.6* 81.3 80.2 80.9 80.8  PLT 503* 571* 540* 578* 576*   Basic Metabolic Panel: Recent Labs  Lab 05/31/24 0450 06/01/24 0514 06/02/24 0439 06/03/24 0522 06/04/24 0635  NA 137 133* 136 134* 136  K 4.5 4.3 4.9 4.5 4.3  CL 101 100 101 96* 100  CO2 21* 22 26 25 24   GLUCOSE 107* 114* 109* 141* 156*  BUN 9 11 11 13 7   CREATININE 0.75 0.90 0.78 1.07* 0.84  CALCIUM 8.9 8.8* 9.0 9.0 9.0  MG 1.7 1.7 1.8 2.0 1.8   GFR: Estimated Creatinine Clearance: 112 mL/min (by C-G formula based on SCr of 0.84 mg/dL). Liver Function Tests: Recent Labs  Lab 05/31/24 0450  06/01/24 0514 06/02/24 0439 06/03/24 0522 06/04/24 0635  AST 17 15 16 18 16   ALT 11 15 12 17 18   ALKPHOS 68 76 72 93 115  BILITOT 0.4 0.4 0.6 0.6 0.4  PROT 6.0* 6.2* 6.0* 6.9 6.7  ALBUMIN 2.4* 2.7* 2.6* 3.0* 2.9*   No results for input(s): LIPASE, AMYLASE in the last 168 hours. No results for input(s): AMMONIA in the last 168 hours. Coagulation Profile: No results for input(s): INR, PROTIME in the last 168 hours. Cardiac Enzymes: No results for input(s): CKTOTAL, CKMB, CKMBINDEX, TROPONINI in the last 168 hours. BNP (last 3 results) No results for input(s): PROBNP in the last 8760 hours. HbA1C: No results for input(s): HGBA1C in the last 72 hours. CBG: Recent Labs  Lab 06/03/24 0815 06/03/24 1228 06/03/24 1721 06/03/24 2150 06/04/24 0754  GLUCAP 187* 110* 158* 107* 207*   Lipid Profile: No results for input(s): CHOL, HDL, LDLCALC,  TRIG, CHOLHDL, LDLDIRECT in the last 72 hours. Thyroid Function Tests: No results for input(s): TSH, T4TOTAL, FREET4, T3FREE, THYROIDAB in the last 72 hours. Anemia Panel: No results for input(s): VITAMINB12, FOLATE, FERRITIN, TIBC, IRON , RETICCTPCT in the last 72 hours. Sepsis Labs: No results for input(s): PROCALCITON, LATICACIDVEN in the last 168 hours.  Recent Results (from the past 240 hours)  Aerobic/Anaerobic Culture w Gram Stain (surgical/deep wound)     Status: None (Preliminary result)   Collection Time: 05/30/24  4:02 PM   Specimen: PATH Cytology FNA; Body Fluid  Result Value Ref Range Status   Specimen Description ABSCESS  Final   Special Requests PANCREATIC CYSTS  Final   Gram Stain   Final    FEW WBC PRESENT, PREDOMINANTLY PMN NO ORGANISMS SEEN Performed at [facility], [address] [place], [place] [address]    Culture   Final    RARE STREPTOCOCCUS MITIS/ORALIS NO ANAEROBES ISOLATED; CULTURE IN PROGRESS FOR 5 DAYS    Report Status PENDING  Incomplete    Organism ID, Bacteria STREPTOCOCCUS MITIS/ORALIS  Final      Susceptibility   Streptococcus mitis/oralis - MIC*    PENICILLIN 0.12 SENSITIVE Sensitive     CEFTRIAXONE  <=0.12 SENSITIVE Sensitive     LEVOFLOXACIN 2 SENSITIVE Sensitive     VANCOMYCIN  0.25 SENSITIVE Sensitive     * RARE STREPTOCOCCUS MITIS/ORALIS         Radiology Studies: No results found.         LOS: 18 days   Time spent= 35 mins    Burgess JAYSON Dare, MD [facility]  If 7PM-7AM, please contact night-coverage  06/04/2024, 11:01 AM

## 2024-06-06 NOTE — Progress Notes (Signed)
               Prior to Admission Medication History             Evanny Guggenheim's medication history has been completed by a Continuity of Care pharmacy technician.  Resources Utilized to [employer] Medication Information: patient interview, review of outpatient pharmacy refill records, and call to home pharmacy    Updates made to home medication list: Added ibuprofen  200 mg and acetaminophen  500 mg Removed cyclobenzaprine (reports no longer taking), hydrochlorothiazide  (reports no longer taking), oxycodone  IR 5 mg (reports no longer taking), potassium chloride  10 mEq (reports no longer taking)     Please review the updated home medication list below and contact the floor specific pharmacist with any further questions. This prior to admission medication information was obtained to the best of our abilities; however, its accuracy assumes patient reliability at the time of the interview and clinical discretion should be used to determine the appropriateness of continuing or discontinuing any medications on admission and discharge.   ROCKIE GERALDS, PharmD  Medications Prior to Admission  Medication Sig Dispense Refill  . acetaminophen  (TYLENOL ) 500 MG tablet Take 3-4 tablets by mouth every 8 (eight) hours as needed for Pain    . amLODIPine  (NORVASC ) 10 MG tablet Take 10 mg by mouth once daily    . ibuprofen  (MOTRIN ) 200 MG tablet Take 2-4 tablets by mouth every 8 (eight) hours as needed for Pain    . metFORMIN  (GLUCOPHAGE ) 500 MG tablet Take 500 mg by mouth 2 (two) times daily with meals    . lisinopriL  (ZESTRIL ) 10 MG tablet Take 10 mg by mouth once daily

## 2024-06-07 NOTE — Discharge Summary (Addendum)
 [facility] Medicine Discharge Summary  Admit Date: 06/04/2024 Discharge Date: 06/07/2024  5:00 PM  Admitting Physician: Minta Spitz, DO Discharge Physician: No att. providers found  Primary Care Provider: Center, [facility], Phone [phone]  Discharge Destination: Home  Admission Diagnoses:  acute pancreatitis  Discharge Diagnoses:  Principal Problem:   Pancreatitis (HHS-HCC) Active Problems:   Infected pancreatic pseudocyst (HHS-HCC)   Weight loss   Vitamin B12 deficiency (dietary) anemia   Tobacco abuse   B12 deficiency anemia Resolved Problems:   * No resolved hospital problems. *  Primary Diagnosis: Admitted for severe pancreatitis c/b recurrent pseudocyst  Changes Made (with rationale):  7/23 EUS with stent placement and cystogastrostomy for pseudocyst fluid drainage IV Ceftriaxone  switched to Ciprofloxacin  500mg  PO BID to complete 5 days post-procedure (07/23 - 07/27)  To-Do List (incidental findings, follow-up studies, etc.): Follow up with PCP within 1 week of discharge Follow up with GI in 3 weeks for CT and f/u EGD  Anticipatory Guidance for Outpatient Care:      Results Pending at Discharge:  None Please see phone numbers at end of this summary for lab contact information.   Follow-up/Care Transition Plan: Sched. appts: No future appointments.  [facility] will arrange follow up appointment and endoscopy   Follow-up info: Center, [facility] [address] [place] [address] [phone]  Schedule an appointment as soon as possible for a visit in 1 week(s)   [facility] [address] [facility] [address] [place]  [phone] Follow up If you have not heard about your hospital follow ups for Gastroenterology after a few days, please call to inquire.      Allergies/Intolerances:  No Known Allergies   New Adverse Drug Events:  none  Medications:     Current Discharge Medication List     START taking these medications      Instructions  blood glucose diagnostic test strip Quantity: 100 each Refills: 0  1 each (1 strip total) 3 (three) times daily Product selection permitted according to insurance preference. E11.9 Type 2 diabetes mellitus   blood glucose meter kit Quantity: 1 each Refills: 0  as directed Product selection permitted according to insurance preference. E11.9 Type 2 diabetes mellitus   ciprofloxacin  HCl 500 MG tablet Quantity: 8 tablet Refills: 0 Stop taking on: June 11, 2024  Commonly known as: CIPRO  Take 1 tablet (500 mg total) by mouth 2 (two) times daily for 4 days   lancets Quantity: 100 each Refills: 0  Use 1 each 3 (three) times daily Product selection permitted according to insurance preference. E11.9 Type 2 diabetes mellitus   multivitamin 400 mcg tablet Refills: 0 Start taking on: June 08, 2024  Take 1 tablet by mouth once daily Last time this was given: 1 tablet on June 07, 2024  8:12 AM   nicotine  14 mg/24 hr patch Quantity: 14 patch Refills: 0 Stop taking on: June 22, 2024 Start taking on: June 08, 2024  Commonly known as: NICODERM CQ  Place 1 patch onto the skin once daily for 14 days Last time this was given: 1 patch on June 07, 2024  8:12 AM   nicotine  polacrilex 2 mg gum Quantity: 100 each Refills: 0  Commonly known as: NICORETTE  Take 1 each (2 mg total) by mouth every 2 (two) hours as needed for Smoking cessation for up to 90 days Chew the gum until it tingles and then park it between your cheek and  gum. Repeat. Last time this was given: Ask your nurse or doctor   oxyCODONE  5 MG immediate release tablet Quantity: 20 tablet Refills: 0 Stop taking on: June 12, 2024  Commonly known as: ROXICODONE  Take 1 tablet (5 mg total) by mouth every 4 (four) hours as needed for up to 5 days Last time this was given: 5 mg on June 07, 2024 11:54 AM   thiamine  100  MG tablet Quantity: 30 tablet Refills: 0 Start taking on: June 09, 2024  Commonly known as: Vitamin B-1 Take 1 tablet (100 mg total) by mouth once daily Last time this was given: Ask your nurse or doctor       These medications have been CHANGED      Instructions  acetaminophen  500 MG tablet Refills: 0 What changed:  how much to take when to take this reasons to take this  Commonly known as: TYLENOL  Take 2 tablets (1,000 mg total) by mouth 3 (three) times a day Last time this was given: 975 mg on June 07, 2024 11:54 AM       CONTINUE taking these medications      Instructions  albuterol  MDI (PROVENTIL , VENTOLIN , PROAIR ) HFA 90 mcg/actuation inhaler Refills: 0  Inhale 2 inhalations into the lungs every 6 (six) hours as needed for Wheezing   amLODIPine  10 MG tablet Refills: 0  Commonly known as: NORVASC  Take 10 mg by mouth once daily Last time this was given: 5 mg on June 07, 2024  8:12 AM   ibuprofen  200 MG tablet Refills: 0  Commonly known as: MOTRIN  Take 2-4 tablets by mouth every 8 (eight) hours as needed for Pain   lisinopriL  10 MG tablet Refills: 0  Commonly known as: ZESTRIL  Take 10 mg by mouth once daily   melatonin 3 mg tablet Refills: 0  Take 6 mg by mouth at bedtime Last time this was given: 6 mg on June 07, 2024  5:11 AM   metFORMIN  500 MG tablet Refills: 0  Commonly known as: GLUCOPHAGE  Take 500 mg by mouth 2 (two) times daily with meals         Brief History of Present Illness: per H&P: Katrina Lambert is a 38 y.o. female w/ a PMH for DM-2, HTN, asthma, IDA, morbid obesity, B12 deficiency, cervical myelopathy, tobacco and THC use disorder, idiopathic pancreatitis (hospitalized at [facility] from 5/3-5/6 for and and then again [facility] 5/31-6/11 for recurrent pancreatitis with pseudocyst as well as hospital-acquired pneumonia w/ parapneumonic effusion requiring chest tube placement) who presents as a transfer from [facility] for further management of  pancreatitis with recurrent pseudocyst.   The pt first developed sx of pancreatitis in early May with hospitalization from 5/3-6/6; she also reports a notable spider bite (had undergone L hip debridement during this admission). She then represented on 5/31-6/11 for recurrent sx with imaging revealing a pseudocyst. Hospital course during that stay was complicated by a parapneumonic effusion for which a chest tube was needed. Ultimately discharged.   She then represented on 7/2 with w/ worsening abd pain along with nausea and vomiting. On initial admission pt was tachycardic. CBC revealed a WBC count of 17. Lactic acid 0.8-2. Lipase 960. CT chest AP showed interval increase of pancreatic tail fluid collection extending along the LUQ inseparable from the greater curvature of the stomach .There was concern for splenic capsular invasion with a possible 2nd abscess. GR and IR consulted. 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 Initially placed on BSA and admitted.   Hospital course at [facility]:  7/2 CT chest abd pelvis w contrast 7/3 MR recon 7/3 S/p CT-guided diagnostic aspiration 7/9 S/p EGD with EUS assisted drainage. Drained 800 mL cyst.  7/13 CT AP on showed pseudocyst was 13.4X8.4 cm that occupied the majority of the LUQ and perisplenic area 7/15 Repeat EUS 7/15, drained 450cc. Cx returned + for Streptococcus mitis/oralis, started Rocephin  7/15 CT abdomen showed almost complete resolution of pseudocyst. Recommendation by GI was to repeat CTAP in 48h and if fluid accumulated then would require ERCP with stent placement 7/17 CT Abdomen showed Multiple rim enhancing fluid collections with inflammatory stranding in the left upper quadrant, suspect for pseudocysts related to pancreatitis (sterility indeterminate on this CT). Dominant fluid collection between the left diaphragm and spleen is contiguous with fluid that  tracks posterior to the spleen and is also contiguous with rim enhancing collection anterior to the gastric fundal region; these collections are increased compared to the most recent prior study from 07/15. Multiple additional thin crescent shaped collections within the peripheral left upper and mid quadrant, lateral to the colon, extending from the level of the diaphragm to the mid abdomen, also suspect for developing pseudocysts and are increased compared to the prior exam. Slight interval decrease in small pseudocyst at the pancreatic tail. 2. Small left-sided pleural effusion with dense left lower lobe consolidation, similar to slightly diminished compared to the CT 2 days ago. 3. Hepatic steatosis. Aortic Atherosclerosis   On acct of the fluid collections increasing, transfer to [facility] was recommended. While awaiting transfer pt started on octreotide .   Pt reports that she was tolerating a full liquid diet. She has lost ~40 pounds since her first pancreatitis episode. She reports that the pain is located along her xyphoid area and in a band form under her diaphragm, eventually  spreading to the left lateral aspect of her abdomen and flank. She notes with standing that the left side of her abdomen is more full. She has been nauseated and constipated. Add'l sx include HA, occasional lightheadness, some burning chest pain and chronic neck pain.  _____________________   Hospital Course by Problem:  # Severe acute pancreatitis w/ large pseudocysts s/p EUS guided cyst aspiration x 2 and s/p EUS guided cystogastrostomy stent placement First episode of pancreatitis was during her initial May hospitalization with recurrence of symptoms resulting in a second hospitalization. At OSH, CT AP showing large pseudocysts that despite drainage have reaccumulated; Cx from 7/15 fluid collection growing Streptococcus mitis oralis; received various rounds of abx ultimately placed on rocephin  2g q24h; on acct of fluid  reaccumulating, there was concern for disrupted pancreatic duct, so it was felt she required a higher level of care for consideration of ERCP with stent; prior to transfer, she was started on q12 octreotide . At [facility], EUS was performed on 07/22 where a cystogastrostomy was performed and an Axios stent was placed into the pancreatic cyst and drained a substantial amount of fluid. The next day she reported feeling much better. She was transitioned from IV ceftriaxone  to cipro  500mg  PO BID. GI followup is being arranged for repeat CT, clinic F/U, and repeat EGD in ~3 weeks. IgG4 levels were low, autoimmune pancreatitis not likely to be the cause.   #Obesity with BMI 41 #Protein calorie malnutrition w/ 40 pound wt loss Pt reports 40lb weight loss since her first pancreatitis episode in May 2025. Was not able to tolerate solids, was  given pureed very low fat diet. Given pancreatitis, will give pureed very low fat diet. Daily MVI and thiamine  were supplemented. Patient's appetite improved prior to discharge.  Malnutrition: She has been diagnosed with severe protein-calorie malnutrition in the context of chronic illness, based on >7.5% unintentional weight loss in 3 months, >10% unintentional weight loss in 6 months, >20% unintentional weight loss in 12 months, energy intake meeting < 75% of estimated requirement for > or equal to 1 month, mild fluid accumulation.  - Recommend oral nutrition, Recommend oral nutrition supplements, Recommend trend weight status, Recommend vitamin/mineral supplementation.    #DM2  HgbA1c of 7.1 on 5/17, 5.9 on 07/21. Pt on metformin  500mg  po bid prior to her first episode of pancreatitis. Recent 40 pound wt loss presumably due to illness. Glucose was 104 on arrival here, will monitor routinely through [facility] for now. She did not require correction-dose insulin . On discharge, she was prescribed a blood glucose meter kit at her request.  #Hx of Depression and anxiety.  Pt was previously  on lexapro but currently is not taking any medications.   #Tobacco use Continue nicoderm patch and nicotine  gum   #HTN Continued amlodipine  during admission. Resumed lisinopril  on discharge.  Surgeries and Procedures Performed:   Procedure(s): Upper Endoscopic Ultrasound 7/22 RADIOLOGIC EXAMINATION, UPPER GASTROINTESTINAL TRACT, INCLUDING SCOUT ABDOMINAL RADIOGRAPH(S) AND DELAYED IMAGE(S), WHEN PERFORMED; SINGLE-CONTRAST (EG, BARIUM) STUDY ESOPHAGOGASTRODUODENOSCOPY, FLEXIBLE, TRANSORAL; W/ TRANSMURAL DRAINAGE OF PSEUDOCYST (INCL PLACEMENT OF TRANSMURAL DRAINAGE CATHETER/STENT, WHEN PERFORMED, AND ENDOSCOPIC ULTRASOUND, WHEN PERFORMED) INTRALUMINAL DILATION OF STRICTURES AND/OR OBSTRUCTIONS (EG, ESOPHAGUS), RADIOLOGICAL SUPERVISION AND INTERPRETATION Impression:   - Normal esophagus.                        - Deformity in the gastric body from known pseudocyst.                        - Normal examined duodenum.                        - A 76 mm by 31 mm pseudocyst was seen. The diagnosis                         is a pancreatic pseudocyst. Cystogastrostomy was                         performed. Dilated with a 08-27-11 mm balloon (to a                         maximum balloon size of 12 mm). Stented.                        - No specimens collected. Recommendation:        - Return patient to hospital ward for ongoing care.                        - Continue to follow course post LAMS drainage of                         pseudocyst.                        - Repeat CT scan in 3 weeks followed by a clinic  visit                         and then a repeat upper endoscopy in 4 weeks for stent                         removal if resolution of pseudocyst. This has been                         ordered.                        - Complete a total of 5 days of antibiotics                         post-procedure.                        - The findings and recommendations were discussed with                          the referring physician.                        - The findings and recommendations were discussed with                         the patient. _____________________  Discharge Exam:  BP (!) 164/82 (BP Location: Left upper arm, Patient Position: Lying)   Pulse 76   Temp 36.9 C (98.4 F) (Oral)   Resp 17   Ht 165.1 cm (5' 5)   Wt (!) 111.2 kg (245 lb 3.2 oz)   LMP  (LMP Unknown)   SpO2 95%   BMI 40.80 kg/m    BP (!) 164/82 (BP Location: Left upper arm, Patient Position: Lying)   Pulse 76   Temp 36.9 C (98.4 F) (Oral)   Resp 17   Ht 165.1 cm (5' 5)   Wt (!) 111.2 kg (245 lb 3.2 oz)   LMP  (LMP Unknown)   SpO2 95%   BMI 40.80 kg/m  General appearance: alert, appears stated age, and cooperative Heart: regular rate and rhythm, S1, S2 normal, no murmur, click, rub or gallop Lungs: clear to auscultation bilaterally and no crackles or wheezes heard, normal effort of breathing Abdomen: soft, bowel sounds normal, TTP on L-side of abdomen radiating to flank Extremities: no edema, no rashes or lesions seen Neurologic: Alert and oriented X 3, normal strength and tone.   Pertinent Lab Testing: Recent Labs  Lab 06/04/24 1954 06/05/24 2018 06/07/24 0810  NA 138 135 135  K 3.9 3.7 4.0  CL 102 102 100  CO2 24 24 24   BUN 6* 6* 9  CREATININE 0.7 1.0 0.9  GLUCOSE 104 136 164*  CALCIUM 8.8 8.5* 9.2   Recent Labs  Lab 06/04/24 1954 06/05/24 2018 06/07/24 0810  AST 20 20 13*  ALT 21 19 17   ALKPHOS 76 79 98  TBILI 0.4 0.4 0.3*    Recent Labs  Lab 06/04/24 1954 06/05/24 2018 06/07/24 0810  WBC 12.6* 13.1* 17.5*  HGB 10.2* 10.0* 11.1*  HCT 32.9* 32.5* 35.7  PLT 525* 496* 556*   Recent Labs  Lab 06/04/24 1954 06/05/24 2018  INR 1.2* 1.3*     Other  Pertinent Labs:   IgG Subclass 06/05/2024:  Immunoglobulin G, Total  754 Low   Immunoglobulin G, Subclass 1 391  Immunoglobulin G, Subclass 2 319  Immunoglobulin G, Subclass 3 56.2  Immunoglobulin G, Subclass 4  38.2    Micro:  None    Pertinent Imaging:   7/21 CT abdominal outside interpretation IMPRESSION: 1.  Increasing size of multiple left subdiaphragmatic fluid collections with possible connection at the superior aspect of the pancreatic tail, suspicious for pancreatic duct leak. 2.  Small left pleural effusion with left lower lobe consolidation, likely atelectasis, although superimposed infection could have a similar appearance in the correct clinical context.  Code Status: Full Code Goals of care were not addressed during this admission.   Status on Discharge:  Current activity: Walks occasionally (06/07/24 0900) Current mobility: No limitation (06/07/24 0900)  Activity Recommendation: activity as tolerated  Other Discharge Instructions: Services setup at discharge: None Tubes/lines at discharge: None  Diet: Oral Supplements - Adult All Supplements; Boost Genie Lesches; Breakfast Oral Supplements - Adult All Supplements; Boost Glucose Control Max Protein-Chocolate; Lunch Oral Supplements - Adult All Supplements; Ensure Plus High Protein-Chocolate; Breakfast and Dinner Oral Supplements - Adult All Supplements; Magic Cup-Chocolate; With Meals Diet Very low fat  Wound Care Order Instructions     None       _____________________  Time spent on discharge process: 45 minutes    LAMAR JAYSON FEIL, MD [facility]  06/07/2024 [facility]  06/07/2024  Hospital Contact Information:  [facility] [facility]) [facility]) [facility])  Pending tests:  Laboratory: [id number] Microbiology: [phone] Pathology: [phone] Radiology: [phone]  General questions: [phone] Pending tests: Laboratory: [phone] Microbiology: [phone] Pathology: [phone] Radiology: [phone]  General questions:  [phone] Pending tests:  Laboratory: [phone] Microbiology:  [phone] Pathology: [phone] Radiology: [phone]  General questions:  [phone]

## 2024-06-13 ENCOUNTER — Other Ambulatory Visit: Payer: Self-pay

## 2024-06-13 ENCOUNTER — Inpatient Hospital Stay (HOSPITAL_COMMUNITY)
Admission: EM | Admit: 2024-06-13 | Discharge: 2024-06-18 | DRG: 439 | Disposition: A | Discharge status: Other Acute Inpatient Hospital | Payer: MEDICAID | Attending: Internal Medicine | Admitting: Internal Medicine

## 2024-06-13 ENCOUNTER — Emergency Department (HOSPITAL_COMMUNITY): Payer: MEDICAID

## 2024-06-13 ENCOUNTER — Encounter (HOSPITAL_COMMUNITY): Payer: Self-pay

## 2024-06-13 DIAGNOSIS — R6510 Systemic inflammatory response syndrome (SIRS) of non-infectious origin without acute organ dysfunction: Secondary | ICD-10-CM

## 2024-06-13 DIAGNOSIS — I10 Essential (primary) hypertension: Secondary | ICD-10-CM | POA: Diagnosis present

## 2024-06-13 DIAGNOSIS — K863 Pseudocyst of pancreas: Secondary | ICD-10-CM | POA: Diagnosis present

## 2024-06-13 DIAGNOSIS — E860 Dehydration: Secondary | ICD-10-CM | POA: Diagnosis present

## 2024-06-13 DIAGNOSIS — Z6839 Body mass index (BMI) 39.0-39.9, adult: Secondary | ICD-10-CM

## 2024-06-13 DIAGNOSIS — Z5982 Transportation insecurity: Secondary | ICD-10-CM

## 2024-06-13 DIAGNOSIS — K8681 Exocrine pancreatic insufficiency: Secondary | ICD-10-CM | POA: Diagnosis present

## 2024-06-13 DIAGNOSIS — D649 Anemia, unspecified: Secondary | ICD-10-CM | POA: Diagnosis present

## 2024-06-13 DIAGNOSIS — Z5941 Food insecurity: Secondary | ICD-10-CM

## 2024-06-13 DIAGNOSIS — Z604 Social exclusion and rejection: Secondary | ICD-10-CM | POA: Diagnosis present

## 2024-06-13 DIAGNOSIS — J9811 Atelectasis: Secondary | ICD-10-CM | POA: Diagnosis present

## 2024-06-13 DIAGNOSIS — Z5986 Financial insecurity: Secondary | ICD-10-CM

## 2024-06-13 DIAGNOSIS — F17210 Nicotine dependence, cigarettes, uncomplicated: Secondary | ICD-10-CM | POA: Diagnosis present

## 2024-06-13 DIAGNOSIS — R188 Other ascites: Secondary | ICD-10-CM | POA: Diagnosis present

## 2024-06-13 DIAGNOSIS — J45909 Unspecified asthma, uncomplicated: Secondary | ICD-10-CM | POA: Diagnosis present

## 2024-06-13 DIAGNOSIS — Z9049 Acquired absence of other specified parts of digestive tract: Secondary | ICD-10-CM

## 2024-06-13 DIAGNOSIS — K861 Other chronic pancreatitis: Secondary | ICD-10-CM | POA: Diagnosis present

## 2024-06-13 DIAGNOSIS — F319 Bipolar disorder, unspecified: Secondary | ICD-10-CM | POA: Diagnosis present

## 2024-06-13 DIAGNOSIS — D75839 Thrombocytosis, unspecified: Secondary | ICD-10-CM | POA: Diagnosis present

## 2024-06-13 DIAGNOSIS — K859 Acute pancreatitis without necrosis or infection, unspecified: Principal | ICD-10-CM | POA: Diagnosis present

## 2024-06-13 LAB — COMPREHENSIVE METABOLIC PANEL WITH GFR
ALT: 21 U/L (ref 0–44)
AST: 17 U/L (ref 15–41)
Albumin: 3.1 g/dL — ABNORMAL LOW (ref 3.5–5.0)
Alkaline Phosphatase: 97 U/L (ref 38–126)
Anion gap: 10 (ref 5–15)
BUN: 12 mg/dL (ref 6–20)
CO2: 26 mmol/L (ref 22–32)
Calcium: 10 mg/dL (ref 8.9–10.3)
Chloride: 102 mmol/L (ref 98–111)
Creatinine, Ser: 0.79 mg/dL (ref 0.44–1.00)
GFR, Estimated: >60 mL/min (ref >60)
Glucose, Bld: 104 mg/dL — ABNORMAL HIGH (ref 70–99)
Potassium: 4.2 mmol/L (ref 3.5–5.1)
Sodium: 138 mmol/L (ref 135–145)
Total Bilirubin: <0.2 mg/dL (ref 0.0–1.2)
Total Protein: 7.4 g/dL (ref 6.5–8.1)

## 2024-06-13 LAB — URINALYSIS, ROUTINE W REFLEX MICROSCOPIC
Bilirubin Urine: NEGATIVE
Glucose, UA: NEGATIVE mg/dL
Hgb urine dipstick: NEGATIVE
Ketones, ur: NEGATIVE mg/dL
Leukocytes,Ua: NEGATIVE
Nitrite: NEGATIVE
Protein, ur: 30 mg/dL — AB
Specific Gravity, Urine: 1.029 (ref 1.005–1.030)
pH: 6 (ref 5.0–8.0)

## 2024-06-13 LAB — CBC
HCT: 41 % (ref 36.0–46.0)
Hemoglobin: 12.4 g/dL (ref 12.0–15.0)
MCH: 24.5 pg — ABNORMAL LOW (ref 26.0–34.0)
MCHC: 30.2 g/dL (ref 30.0–36.0)
MCV: 80.9 fL (ref 80.0–100.0)
Platelets: 755 K/uL — ABNORMAL HIGH (ref 150–400)
RBC: 5.07 MIL/uL (ref 3.87–5.11)
RDW: 15.9 % — ABNORMAL HIGH (ref 11.5–15.5)
WBC: 20.6 K/uL — ABNORMAL HIGH (ref 4.0–10.5)
nRBC: 0 % (ref 0.0–0.2)

## 2024-06-13 LAB — I-STAT CG4 LACTIC ACID, ED: Lactic Acid, Venous: 1.5 mmol/L (ref 0.5–1.9)

## 2024-06-13 LAB — HCG, SERUM, QUALITATIVE: Preg, Serum: NEGATIVE

## 2024-06-13 LAB — LIPASE, BLOOD: Lipase: 320 U/L — ABNORMAL HIGH (ref 11–51)

## 2024-06-13 MED ORDER — SODIUM CHLORIDE 0.9 % IV BOLUS
1000.0000 mL | Freq: Once | INTRAVENOUS | Status: AC
  Administered 2024-06-13: 1000 mL via INTRAVENOUS

## 2024-06-13 MED ORDER — OXYCODONE-ACETAMINOPHEN 5-325 MG PO TABS
1.0000 | ORAL_TABLET | Freq: Once | ORAL | Status: AC
  Administered 2024-06-13: 1 via ORAL
  Filled 2024-06-13: qty 1

## 2024-06-13 MED ORDER — IOHEXOL 350 MG/ML SOLN
100.0000 mL | Freq: Once | INTRAVENOUS | Status: AC | PRN
  Administered 2024-06-13: 100 mL via INTRAVENOUS

## 2024-06-13 MED ORDER — ONDANSETRON HCL 4 MG/2ML IJ SOLN
4.0000 mg | Freq: Once | INTRAMUSCULAR | Status: AC
  Administered 2024-06-13: 4 mg via INTRAVENOUS
  Filled 2024-06-13: qty 2

## 2024-06-13 MED ORDER — SODIUM CHLORIDE 0.9 % IV SOLN
2.0000 g | Freq: Once | INTRAVENOUS | Status: AC
  Administered 2024-06-13: 2 g via INTRAVENOUS
  Filled 2024-06-13: qty 20

## 2024-06-13 MED ORDER — HYDROMORPHONE HCL 1 MG/ML IJ SOLN
1.0000 mg | Freq: Once | INTRAMUSCULAR | Status: AC
  Administered 2024-06-13: 1 mg via INTRAVENOUS
  Filled 2024-06-13: qty 1

## 2024-06-13 NOTE — ED Provider Notes (Signed)
 [place] EMERGENCY DEPARTMENT AT [facility] Provider Note   CSN: [id number] Arrival date & time: 06/13/24  1750     Patient presents with: Abdominal Pain   Katrina Lambert is a 38 y.o. female with medical history of bipolar disorder, depression, GERD, headaches, hypertension, neuromuscular disorder, acute pancreatitis.  Patient presents to ED for evaluation of epigastric pain, left left flank pain.  Reports that she was recently seen at this hospital, diagnosed with pancreatitis and transferred back to [facility].  At [facility], the patient received a stent in her pancreas and was hospitalized and discharged on 7/23.  Patient reports that for the last 2 days she has had epigastric burning described as indigestion and heartburn.  She states that today this pain changed and she now describes it as a pain described as a pressure that begins in her epigastric region and radiates to her left flank and left abdomen.  She endorses mild episodes of nausea and vomiting.  States that she had a fever at home up to 101.4, took Tylenol  and presented to ED.  Endorsing diarrhea that began this morning, denies blood in stool.  Denies dysuria.  Denies chest pain or shortness of breath.  Reports she feels very similarly to her first episode of pancreatitis.  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  Patient was then subsequently [facility] to 6/11 for recurrent pancreatitis with pseudocyst.  Patient also was experiencing hospital-acquired pneumonia at this time complicated by parapneumonic effusion requiring chest tube placement.  Patient then ultimately discharged.  Patient then returned to ED on 7/2 with worsening abdominal pain along with nausea and vomiting.  Patient was started on octreotide  by GI, Rocephin  for strep mitis and or aspirated fluid from pancreas.  Patient was transferred to [facility] for higher  level of care.    Abdominal Pain      Prior to Admission medications   Medication Sig Start Date End Date Taking? Authorizing Provider  acetaminophen  (TYLENOL ) 500 MG tablet Take 500 mg by mouth every 6 (six) hours as needed for mild pain (pain score 1-3).    [provider]  cefTRIAXone  2 g in sodium chloride  0.9 % 100 mL Inject 2 g into the vein daily. 06/04/24   Amin, Ankit C, MD  lisinopril  (ZESTRIL ) 10 MG tablet Take 10 mg by mouth daily.    [provider]    Allergies: Patient has no known allergies.    Review of Systems  Gastrointestinal:  Positive for abdominal pain.    Updated Vital Signs BP (!) 143/94 (BP Location: Right Arm)   Pulse (!) 115   Temp 98.4 F (36.9 C)   Resp (!) 22   Ht 5' 5 (1.651 m)   Wt 108.9 kg   SpO2 97%   BMI 39.94 kg/m   Physical Exam Vitals and nursing note reviewed.  Constitutional:      General: She is not in acute distress.    Appearance: She is well-developed.  HENT:     Head: Normocephalic and atraumatic.  Eyes:     Conjunctiva/sclera: Conjunctivae normal.  Cardiovascular:     Rate and Rhythm: Normal rate and regular rhythm.     Heart sounds: No murmur heard. Pulmonary:     Effort: Pulmonary effort is normal. No respiratory distress.     Breath sounds: Normal breath sounds.  Abdominal:  Palpations: Abdomen is soft.     Tenderness: There is abdominal tenderness.     Comments: Tenderness to palpation of epigastric region, left upper quadrant.  Guarding present.  No overlying skin change.  Musculoskeletal:        General: No swelling.     Cervical back: Neck supple.  Skin:    General: Skin is warm and dry.     Capillary Refill: Capillary refill takes less than 2 seconds.  Neurological:     Mental Status: She is alert and oriented to person, place, and time. Mental status is at baseline.  Psychiatric:        Mood and Affect: Mood normal.     (all labs ordered are listed, but only abnormal results  are displayed) Labs Reviewed  LIPASE, BLOOD - Abnormal; Notable for the following components:      Result Value   Lipase 320 (*)    All other components within normal limits  COMPREHENSIVE METABOLIC PANEL WITH GFR - Abnormal; Notable for the following components:   Glucose, Bld 104 (*)    Albumin 3.1 (*)    All other components within normal limits  CBC - Abnormal; Notable for the following components:   WBC 20.6 (*)    MCH 24.5 (*)    RDW 15.9 (*)    Platelets 755 (*)    All other components within normal limits  URINALYSIS, ROUTINE W REFLEX MICROSCOPIC - Abnormal; Notable for the following components:   APPearance HAZY (*)    Protein, ur 30 (*)    Bacteria, UA RARE (*)    All other components within normal limits  CULTURE, BLOOD (ROUTINE X 2)  CULTURE, BLOOD (ROUTINE X 2)  HCG, SERUM, QUALITATIVE  CBC WITH DIFFERENTIAL/PLATELET  COMPREHENSIVE METABOLIC PANEL WITH GFR  MAGNESIUM   LIPASE, BLOOD  I-STAT CG4 LACTIC ACID, ED    EKG: None  Radiology: CT CHEST ABDOMEN PELVIS W CONTRAST Result Date: 06/13/2024 CLINICAL DATA:  abdominal pain, post-surgery from previous pancreatitis EXAM: CT CHEST, ABDOMEN, AND PELVIS WITH CONTRAST TECHNIQUE: Multidetector CT imaging of the chest, abdomen and pelvis was performed following the standard protocol during bolus administration of intravenous contrast. RADIATION DOSE REDUCTION: This exam was performed according to the departmental dose-optimization program which includes automated exposure control, adjustment of the mA and/or kV according to patient size and/or use of iterative reconstruction technique. CONTRAST:  OMNIPAQUE  IOHEXOL  350 MG/ML SOLN COMPARISON:  06/01/2024 FINDINGS: CT CHEST FINDINGS Cardiovascular: Heart is normal size. Aorta is normal caliber. Mediastinum/Nodes: No mediastinal, hilar, or axillary adenopathy. Trachea and esophagus are unremarkable. Thyroid unremarkable. Lungs/Pleura: Small left pleural effusion. Airspace  opacity in the left lower lobe and lingula could reflect atelectasis or pneumonia. No confluent opacity or effusion on the right. Musculoskeletal: Chest wall soft tissues are unremarkable. No acute bony abnormality. CT ABDOMEN PELVIS FINDINGS Hepatobiliary: No focal liver abnormality is seen. Status post cholecystectomy. No biliary dilatation. Pancreas: Residual stranding around the tail of the pancreas is again noted and unchanged. Cystic area in the tail of pancreas 16 x 11 mm, stable. Spleen: No focal abnormality.  Normal size. Adrenals/Urinary Tract: No adrenal abnormality. No focal renal abnormality. No stones or hydronephrosis. Urinary bladder is unremarkable. Stomach/Bowel: Stomach, large and small bowel grossly unremarkable. Vascular/Lymphatic: Aortic atherosclerosis. No evidence of aneurysm or adenopathy. Reproductive: Uterus and adnexa unremarkable. No mass. IUD in the uterus. Other: Left upper quadrant drainage catheter noted terminating in the stomach. Decreasing size of the fluid collection under the left hemidiaphragm  now measuring 4.9 x 2.9 cm compared to 8 x 5.8 cm previously. Fluid collection lateral in the left upper quadrant now measures 5.1 x 2.1 cm, larger than prior study when this was not measurable. Fluid collection laterally lower in the left abdomen measures 7.1 x 2.2 cm compared to 7.3 x 1.8 cm, not significantly changed. 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 Moderate degenerative disc and facet disease at L4-5 and L5-S1. IMPRESSION: Stranding around the pancreatic tail again noted, unchanged. Subdiaphragmatic left upper quadrant fluid collection has decreased in size. There is a drainage catheter in the region terminating in the stomach. Left upper quadrant lateral fluid collection measures 5.1 x 2.1 cm, slightly larger than prior study. More inferiorly in the left abdomen laterally, 7.1 x 2.2  cm fluid collection not significantly changed since prior study. Small left pleural effusion. Compressive atelectasis or pneumonia in the left lower lobe and lingula. Electronically Signed   By: Franky Crease M.D.   On: 06/13/2024 21:15     Procedures   Medications Ordered in the ED  acetaminophen  (TYLENOL ) tablet 650 mg (has no administration in time range)    Or  acetaminophen  (TYLENOL ) suppository 650 mg (has no administration in time range)  ondansetron  (ZOFRAN ) injection 4 mg (has no administration in time range)  HYDROmorphone  (DILAUDID ) injection 1 mg (has no administration in time range)  0.9 %  sodium chloride  infusion (has no administration in time range)  ondansetron  (ZOFRAN ) injection 4 mg (4 mg Intravenous Given 06/13/24 2019)  oxyCODONE -acetaminophen  (PERCOCET/ROXICET) 5-325 MG per tablet 1 tablet (1 tablet Oral Given 06/13/24 2019)  iohexol  (OMNIPAQUE ) 350 MG/ML injection 100 mL (100 mLs Intravenous Contrast Given 06/13/24 2103)  sodium chloride  0.9 % bolus 1,000 mL (0 mLs Intravenous Stopped 06/14/24 0055)  HYDROmorphone  (DILAUDID ) injection 1 mg (1 mg Intravenous Given 06/13/24 2332)  ondansetron  (ZOFRAN ) injection 4 mg (4 mg Intravenous Given 06/13/24 2333)  cefTRIAXone  (ROCEPHIN ) 2 g in sodium chloride  0.9 % 100 mL IVPB (0 g Intravenous Stopped 06/14/24 0056)  morphine  (PF) 4 MG/ML injection 4 mg (4 mg Intravenous Given 06/14/24 0059)  sodium chloride  0.9 % bolus 1,000 mL (0 mLs Intravenous Stopped 06/14/24 0252)  HYDROmorphone  (DILAUDID ) injection 1 mg (1 mg Intravenous Given 06/14/24 0253)  LORazepam  (ATIVAN ) injection 0.5 mg (0.5 mg Intravenous Given 06/14/24 0259)  0.9 %  sodium chloride  infusion ( Intravenous New Bag/Given 06/14/24 0321)    Clinical Course as of 06/14/24 0430  Tue Jun 13, 2024  2255 # Severe acute pancreatitis w/ large pseudocysts s/p EUS guided cyst aspiration x 2 and s/p EUS guided cystogastrostomy stent placement First episode of pancreatitis was during  her initial May hospitalization with recurrence of symptoms resulting in a second hospitalization. At OSH, CT AP showing large pseudocysts that despite drainage have reaccumulated; Cx from 7/15 fluid collection growing Streptococcus mitis oralis; received various rounds of abx ultimately placed on rocephin  2g q24h; on acct of fluid reaccumulating, there was concern for disrupted pancreatic duct, so it was felt she required a higher level of care for consideration of ERCP with stent; prior to transfer, she was started on q12 octreotide . At [facility] EUS was performed on 07/22 where a cystogastrostomy was performed and an Axios stent was placed into the pancreatic cyst and drained a substantial amount of fluid. The next day she reported feeling much better. She was transitioned from IV ceftriaxone  to cipro  500mg  PO BID. GI  followup is being arranged for repeat CT, clinic F/U, and repeat EGD in ~3 weeks. IgG4 levels were low, autoimmune pancreatitis not likely to be the cause. [CG]  2307 Highest lipase at OSH 960 [CG]  Wed Jun 14, 2024  9651 Have attempted to talk to [facility].  Still waiting callback.  At this time, see no reason as to why patient will need transfer.  Patient was set up for transfer last admission due to need for ERCP but patient has patent stent in place.  Have consulted for admission at this time. [CG]    Clinical Course User Index [CG] Ruthell Lonni FALCON, PA-C   Medical Decision Making Risk Prescription drug management. Decision regarding hospitalization.   This is a 38 year old female with idiopathic pancreatitis who presents due to concern of epigastric abdominal pain, fever, nausea and vomiting.  Chart reviewed.  Patient has had a long standing issues with pancreatitis.  Recently admitted at the beginning of the month and ultimately transferred to [facility] due to need of ERCP with stent ultimately being placed.  Patient arrived to the ED tachycardic, elevated temperature.  Endorsing  abdominal pain.  Workup was initiated and included a CBC, CMP, lipase, urinalysis, lactic acid, CT chest abdomen pelvis.  Patient CBC reveals leukocytosis 20.6.  No anemia.  Metabolic panel unremarkable.  Lipase elevated at 320 concerning for pancreatitis.  Urinalysis negative for all.  Lactic acid not elevated at 1.5.  CT scan shows that she has stranding around the pancreatic tail again noted which is unchanged from prior study.  The subdiaphragmatic left upper quadrant fluid collection has decreased in size.  There is a drainage catheter in the region terminating in the stomach.  The left upper quadrant lateral fluid collection which is larger than prior study.  Smaller pleural effusion also noted.  Some compressive atelectasis versus pneumonia left lower lobe.  I attempted to discuss the patient with [facility] for possible transfer.  Chart was reviewed and the patient seems to have been transferred to [facility] due to need of ERCP.  Patient stent appears patent.  Have not received callback from [facility] at this time so we will consult hospitalist here at [facility] for admission.  Patient started on 2 g Rocephin .  Given multiple rounds of pain medication as well as nausea medication.  Patient given 2 L of LR and placed on maintenance fluids at this time.  Have consulted for admission.  Discussed with Dr. Marcene of the [facility] hospitalist service.  He has agreed to admit the patient.  Patient amenable to plan.  Stable on admission.     Final diagnoses:  Acute pancreatitis, unspecified complication status, unspecified pancreatitis type    ED Discharge Orders     None          Ruthell Lonni FALCON, PA-C 06/14/24 0430    Bari Charmaine FALCON, MD 06/15/24 [phone]

## 2024-06-13 NOTE — ED Provider Notes (Incomplete)
 [place] EMERGENCY DEPARTMENT AT [facility] Provider Note   CSN: [id number] Arrival date & time: 06/13/24  1750     Patient presents with: Abdominal Pain   Katrina Lambert is a 38 y.o. female with medical history of bipolar disorder, depression, GERD, headaches, hypertension, neuromuscular disorder, acute pancreatitis.  Patient presents to ED for evaluation of epigastric pain, left left flank pain.  Reports that she was recently seen at this hospital, diagnosed with pancreatitis and transferred back to [facility].  At [facility], the patient received a stent in her pancreas and was hospitalized and discharged on 7/23.  Patient reports that for the last 2 days she has had epigastric burning described as indigestion and heartburn.  She states that today this pain changed and she now describes it as a pain described as a pressure that begins in her epigastric region and radiates to her left flank and left abdomen.  She endorses mild episodes of nausea and vomiting.  States that she had a fever at home up to 101.4, took Tylenol  and presented to ED.  Endorsing diarrhea that began this morning, denies blood in stool.  Denies dysuria.  Denies chest pain or shortness of breath.  Reports she feels very similarly to her first episode of pancreatitis.  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  Patient was then subsequently seen at [facility] from 5/31 to 6/11 for recurrent pancreatitis with pseudocyst.  Patient also was experiencing hospital-acquired pneumonia at this time complicated by parapneumonic effusion requiring chest tube placement.  Patient then ultimately discharged.  Patient then returned to ED on 7/2 with worsening abdominal pain along with nausea and vomiting.  Patient was started on octreotide  by GI, Rocephin  for strep mitis and or aspirated fluid from pancreas.  Patient was transferred to [facility] for higher  level of care.    Abdominal Pain      Prior to Admission medications   Medication Sig Start Date End Date Taking? Authorizing Provider  acetaminophen  (TYLENOL ) 500 MG tablet Take 500 mg by mouth every 6 (six) hours as needed for mild pain (pain score 1-3).    [provider]  cefTRIAXone  2 g in sodium chloride  0.9 % 100 mL Inject 2 g into the vein daily. 06/04/24   Amin, Ankit C, MD  lisinopril  (ZESTRIL ) 10 MG tablet Take 10 mg by mouth daily.    [provider]    Allergies: Patient has no known allergies.    Review of Systems  Gastrointestinal:  Positive for abdominal pain.    Updated Vital Signs BP (!) 131/91 (BP Location: Right Arm)   Pulse (!) 128   Temp 98.4 F (36.9 C)   Resp 18   Ht 5' 5 (1.651 m)   Wt 108.9 kg   SpO2 93%   BMI 39.94 kg/m   Physical Exam  (all labs ordered are listed, but only abnormal results are displayed) Labs Reviewed  LIPASE, BLOOD - Abnormal; Notable for the following components:      Result Value   Lipase 320 (*)    All other components within normal limits  COMPREHENSIVE METABOLIC PANEL WITH GFR - Abnormal; Notable for the following components:   Glucose, Bld 104 (*)    Albumin 3.1 (*)    All other components within normal limits  CBC - Abnormal; Notable for the following components:   WBC 20.6 (*)    [facility]  24.5 (*)    RDW 15.9 (*)    Platelets 755 (*)    All other components within normal limits  URINALYSIS, ROUTINE W REFLEX MICROSCOPIC - Abnormal; Notable for the following components:   APPearance HAZY (*)    Protein, ur 30 (*)    Bacteria, UA RARE (*)    All other components within normal limits  HCG, SERUM, QUALITATIVE  I-STAT CG4 LACTIC ACID, ED  I-STAT CG4 LACTIC ACID, ED    EKG: None  Radiology: CT CHEST ABDOMEN PELVIS W CONTRAST Result Date: 06/13/2024 CLINICAL DATA:  abdominal pain, post-surgery from previous pancreatitis EXAM: CT CHEST, ABDOMEN, AND PELVIS WITH CONTRAST TECHNIQUE:  Multidetector CT imaging of the chest, abdomen and pelvis was performed following the standard protocol during bolus administration of intravenous contrast. RADIATION DOSE REDUCTION: This exam was performed according to the departmental dose-optimization program which includes automated exposure control, adjustment of the mA and/or kV according to patient size and/or use of iterative reconstruction technique. CONTRAST:  OMNIPAQUE  IOHEXOL  350 MG/ML SOLN COMPARISON:  06/01/2024 FINDINGS: CT CHEST FINDINGS Cardiovascular: Heart is normal size. Aorta is normal caliber. Mediastinum/Nodes: No mediastinal, hilar, or axillary adenopathy. Trachea and esophagus are unremarkable. Thyroid unremarkable. Lungs/Pleura: Small left pleural effusion. Airspace opacity in the left lower lobe and lingula could reflect atelectasis or pneumonia. No confluent opacity or effusion on the right. Musculoskeletal: Chest wall soft tissues are unremarkable. No acute bony abnormality. CT ABDOMEN PELVIS FINDINGS Hepatobiliary: No focal liver abnormality is seen. Status post cholecystectomy. No biliary dilatation. Pancreas: Residual stranding around the tail of the pancreas is again noted and unchanged. Cystic area in the tail of pancreas 16 x 11 mm, stable. Spleen: No focal abnormality.  Normal size. Adrenals/Urinary Tract: No adrenal abnormality. No focal renal abnormality. No stones or hydronephrosis. Urinary bladder is unremarkable. Stomach/Bowel: Stomach, large and small bowel grossly unremarkable. Vascular/Lymphatic: Aortic atherosclerosis. No evidence of aneurysm or adenopathy. Reproductive: Uterus and adnexa unremarkable. No mass. IUD in the uterus. Other: Left upper quadrant drainage catheter noted terminating in the stomach. Decreasing size of the fluid collection under the left hemidiaphragm now measuring 4.9 x 2.9 cm compared to 8 x 5.8 cm previously. Fluid collection lateral in the left upper quadrant now measures 5.1 x 2.1 cm,  larger than prior study when this was not measurable. Fluid collection laterally lower in the left abdomen measures 7.1 x 2.2 cm compared to 7.3 x 1.8 cm, not significantly changed. 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 Moderate degenerative disc and facet disease at L4-5 and L5-S1. IMPRESSION: Stranding around the pancreatic tail again noted, unchanged. Subdiaphragmatic left upper quadrant fluid collection has decreased in size. There is a drainage catheter in the region terminating in the stomach. Left upper quadrant lateral fluid collection measures 5.1 x 2.1 cm, slightly larger than prior study. More inferiorly in the left abdomen laterally, 7.1 x 2.2 cm fluid collection not significantly changed since prior study. Small left pleural effusion. Compressive atelectasis or pneumonia in the left lower lobe and lingula. Electronically Signed   By: Franky Crease M.D.   On: 06/13/2024 21:15    {Document cardiac monitor, telemetry assessment procedure when appropriate:32947} Procedures   Medications Ordered in the ED  sodium chloride  0.9 % bolus 1,000 mL (has no administration in time range)  HYDROmorphone  (DILAUDID ) injection 1 mg (has no administration in time range)  ondansetron  (ZOFRAN ) injection 4 mg (has no administration in  time range)  ondansetron  (ZOFRAN ) injection 4 mg (4 mg Intravenous Given 06/13/24 2019)  oxyCODONE -acetaminophen  (PERCOCET/ROXICET) 5-325 MG per tablet 1 tablet (1 tablet Oral Given 06/13/24 2019)  iohexol  (OMNIPAQUE ) 350 MG/ML injection 100 mL (100 mLs Intravenous Contrast Given 06/13/24 2103)    Clinical Course as of 06/13/24 2300  Tue Jun 13, 2024  2255 # Severe acute pancreatitis w/ large pseudocysts s/p EUS guided cyst aspiration x 2 and s/p EUS guided cystogastrostomy stent placement First episode of pancreatitis was during her initial May hospitalization with recurrence of  symptoms resulting in a second hospitalization. At OSH, CT AP showing large pseudocysts that despite drainage have reaccumulated; Cx from 7/15 fluid collection growing Streptococcus mitis oralis; received various rounds of abx ultimately placed on rocephin  2g q24h; on acct of fluid reaccumulating, there was concern for disrupted pancreatic duct, so it was felt she required a higher level of care for consideration of ERCP with stent; prior to transfer, she was started on q12 octreotide . At [facility], EUS was performed on 07/22 where a cystogastrostomy was performed and an Axios stent was placed into the pancreatic cyst and drained a substantial amount of fluid. The next day she reported feeling much better. She was transitioned from IV ceftriaxone  to cipro  500mg  PO BID. GI followup is being arranged for repeat CT, clinic F/U, and repeat EGD in ~3 weeks. IgG4 levels were low, autoimmune pancreatitis not likely to be the cause. [CG]    Clinical Course User Index [CG] Ruthell Lonni FALCON, PA-C   {Click here for ABCD2, HEART and other calculators REFRESH Note before signing:1}                              Medical Decision Making Risk Prescription drug management.   ***  {Document critical care time when appropriate  Document review of labs and clinical decision tools ie CHADS2VASC2, etc  Document your independent review of radiology images and any outside records  Document your discussion with family members, caretakers and with consultants  Document social determinants of health affecting pt's care  Document your decision making why or why not admission, treatments were needed:32947:::1}   Final diagnoses:  None    ED Discharge Orders     None

## 2024-06-13 NOTE — ED Triage Notes (Signed)
 Pt states she has pancreatitis. C/O left flank pain. Hx of psuedocyst. C/O n/v/d/fevers/CP/SHOB. States had heartburn for 2 days.

## 2024-06-13 NOTE — ED Provider Triage Note (Signed)
 Emergency Medicine Provider Triage Evaluation Note  Katrina Lambert , a 38 y.o. female  was evaluated in triage.  Pt complains of L sided abdominal pain, patient recently admitted for pancreatitis and had surgery at [facility]. Now complaining of L-sided flank pain since this morning. Patient states she has had fever of 101.6 at home, taking tylenol . Also appreciates nausea, vomiting.   Review of Systems  Positive: Generalized abdominal tenderness, left side greater than right, left flank pain, nausea, vomiting, fever Negative: Hematemesis, chest pain, shortness of breath  Physical Exam  BP (!) 146/103   Pulse (!) 127   Temp 99.1 F (37.3 C)   Resp 20   Ht 5' 5 (1.651 m)   Wt 108.9 kg   SpO2 99%   BMI 39.94 kg/m  Gen:   Awake, no distress   Resp:  Normal effort  MSK:   Moves extremities without difficulty  Other:  Abdomen soft, generalized tenderness with palpation, left flank extremely tender to palpation, no guarding or rebound  Medical Decision Making  Medically screening exam initiated at 7:30 PM.  Appropriate orders placed.  Katrina Lambert was informed that the remainder of the evaluation will be completed by another provider, this initial triage assessment does not replace that evaluation, and the importance of remaining in the ED until their evaluation is complete.  Orders: CBC, CMP, lipase, serum pregnancy, i-STAT lactic acid, UA   Janetta Terrall FALCON, PA-C 06/13/24 1938

## 2024-06-14 DIAGNOSIS — K8689 Other specified diseases of pancreas: Secondary | ICD-10-CM | POA: Diagnosis not present

## 2024-06-14 DIAGNOSIS — K863 Pseudocyst of pancreas: Secondary | ICD-10-CM | POA: Diagnosis present

## 2024-06-14 DIAGNOSIS — F319 Bipolar disorder, unspecified: Secondary | ICD-10-CM | POA: Diagnosis present

## 2024-06-14 DIAGNOSIS — F17210 Nicotine dependence, cigarettes, uncomplicated: Secondary | ICD-10-CM | POA: Diagnosis present

## 2024-06-14 DIAGNOSIS — Z5941 Food insecurity: Secondary | ICD-10-CM | POA: Diagnosis not present

## 2024-06-14 DIAGNOSIS — D75839 Thrombocytosis, unspecified: Secondary | ICD-10-CM | POA: Diagnosis present

## 2024-06-14 DIAGNOSIS — K8500 Idiopathic acute pancreatitis without necrosis or infection: Secondary | ICD-10-CM | POA: Diagnosis not present

## 2024-06-14 DIAGNOSIS — K8502 Idiopathic acute pancreatitis with infected necrosis: Secondary | ICD-10-CM | POA: Diagnosis not present

## 2024-06-14 DIAGNOSIS — Z9049 Acquired absence of other specified parts of digestive tract: Secondary | ICD-10-CM

## 2024-06-14 DIAGNOSIS — K861 Other chronic pancreatitis: Secondary | ICD-10-CM | POA: Diagnosis present

## 2024-06-14 DIAGNOSIS — Z604 Social exclusion and rejection: Secondary | ICD-10-CM | POA: Diagnosis present

## 2024-06-14 DIAGNOSIS — J9811 Atelectasis: Secondary | ICD-10-CM | POA: Diagnosis present

## 2024-06-14 DIAGNOSIS — K8532 Drug induced acute pancreatitis with infected necrosis: Secondary | ICD-10-CM | POA: Diagnosis not present

## 2024-06-14 DIAGNOSIS — R188 Other ascites: Secondary | ICD-10-CM | POA: Diagnosis present

## 2024-06-14 DIAGNOSIS — E860 Dehydration: Secondary | ICD-10-CM | POA: Diagnosis present

## 2024-06-14 DIAGNOSIS — J45909 Unspecified asthma, uncomplicated: Secondary | ICD-10-CM | POA: Diagnosis present

## 2024-06-14 DIAGNOSIS — K859 Acute pancreatitis without necrosis or infection, unspecified: Secondary | ICD-10-CM | POA: Diagnosis present

## 2024-06-14 DIAGNOSIS — Z6839 Body mass index (BMI) 39.0-39.9, adult: Secondary | ICD-10-CM | POA: Diagnosis not present

## 2024-06-14 DIAGNOSIS — Z5986 Financial insecurity: Secondary | ICD-10-CM | POA: Diagnosis not present

## 2024-06-14 DIAGNOSIS — Z743 Need for continuous supervision: Secondary | ICD-10-CM | POA: Diagnosis not present

## 2024-06-14 DIAGNOSIS — Z5982 Transportation insecurity: Secondary | ICD-10-CM | POA: Diagnosis not present

## 2024-06-14 DIAGNOSIS — K8681 Exocrine pancreatic insufficiency: Secondary | ICD-10-CM | POA: Diagnosis present

## 2024-06-14 DIAGNOSIS — D649 Anemia, unspecified: Secondary | ICD-10-CM | POA: Diagnosis present

## 2024-06-14 DIAGNOSIS — R6510 Systemic inflammatory response syndrome (SIRS) of non-infectious origin without acute organ dysfunction: Secondary | ICD-10-CM

## 2024-06-14 DIAGNOSIS — I10 Essential (primary) hypertension: Secondary | ICD-10-CM | POA: Diagnosis present

## 2024-06-14 DIAGNOSIS — R1012 Left upper quadrant pain: Secondary | ICD-10-CM | POA: Diagnosis not present

## 2024-06-14 LAB — CBC WITH DIFFERENTIAL/PLATELET
Abs Granulocyte: 14.7 K/uL — ABNORMAL HIGH (ref 1.5–6.5)
Abs Immature Granulocytes: 0.15 K/uL — ABNORMAL HIGH (ref 0.00–0.07)
Basophils Absolute: 0.1 K/uL (ref 0.0–0.1)
Basophils Relative: 0 %
Eosinophils Absolute: 0.3 K/uL (ref 0.0–0.5)
Eosinophils Relative: 1 %
HCT: 36.4 % (ref 36.0–46.0)
Hemoglobin: 11 g/dL — ABNORMAL LOW (ref 12.0–15.0)
Immature Granulocytes: 1 %
Lymphocytes Relative: 19 %
Lymphs Abs: 3.8 K/uL (ref 0.7–4.0)
MCH: 24.3 pg — ABNORMAL LOW (ref 26.0–34.0)
MCHC: 30.2 g/dL (ref 30.0–36.0)
MCV: 80.5 fL (ref 80.0–100.0)
Monocytes Absolute: 1.3 K/uL — ABNORMAL HIGH (ref 0.1–1.0)
Monocytes Relative: 6 %
Neutro Abs: 14.7 K/uL — ABNORMAL HIGH (ref 1.7–7.7)
Neutrophils Relative %: 73 %
Platelets: 685 K/uL — ABNORMAL HIGH (ref 150–400)
RBC: 4.52 MIL/uL (ref 3.87–5.11)
RDW: 15.9 % — ABNORMAL HIGH (ref 11.5–15.5)
Smear Review: NORMAL
WBC: 20.4 K/uL — ABNORMAL HIGH (ref 4.0–10.5)
nRBC: 0 % (ref 0.0–0.2)

## 2024-06-14 LAB — COMPREHENSIVE METABOLIC PANEL WITH GFR
ALT: 20 U/L (ref 0–44)
AST: 15 U/L (ref 15–41)
Albumin: 2.7 g/dL — ABNORMAL LOW (ref 3.5–5.0)
Alkaline Phosphatase: 93 U/L (ref 38–126)
Anion gap: 12 (ref 5–15)
BUN: 13 mg/dL (ref 6–20)
CO2: 18 mmol/L — ABNORMAL LOW (ref 22–32)
Calcium: 8.5 mg/dL — ABNORMAL LOW (ref 8.9–10.3)
Chloride: 103 mmol/L (ref 98–111)
Creatinine, Ser: 0.82 mg/dL (ref 0.44–1.00)
GFR, Estimated: >60 mL/min (ref >60)
Glucose, Bld: 97 mg/dL (ref 70–99)
Potassium: 4 mmol/L (ref 3.5–5.1)
Sodium: 133 mmol/L — ABNORMAL LOW (ref 135–145)
Total Bilirubin: 0.5 mg/dL (ref 0.0–1.2)
Total Protein: 6.4 g/dL — ABNORMAL LOW (ref 6.5–8.1)

## 2024-06-14 LAB — MAGNESIUM: Magnesium: 1.6 mg/dL — ABNORMAL LOW (ref 1.7–2.4)

## 2024-06-14 LAB — LIPASE, BLOOD: Lipase: 331 U/L — ABNORMAL HIGH (ref 11–51)

## 2024-06-14 LAB — C-REACTIVE PROTEIN: CRP: 30.4 mg/dL — ABNORMAL HIGH (ref <1.0)

## 2024-06-14 MED ORDER — PANTOPRAZOLE SODIUM 40 MG IV SOLR
40.0000 mg | INTRAVENOUS | Status: DC
  Administered 2024-06-14 – 2024-06-18 (×5): 40 mg via INTRAVENOUS
  Filled 2024-06-14 (×5): qty 10

## 2024-06-14 MED ORDER — HYDROMORPHONE HCL 1 MG/ML IJ SOLN
1.0000 mg | INTRAMUSCULAR | Status: DC | PRN
  Administered 2024-06-14 (×6): 1 mg via INTRAVENOUS
  Filled 2024-06-14 (×6): qty 1

## 2024-06-14 MED ORDER — HYDROMORPHONE HCL 1 MG/ML IJ SOLN
1.0000 mg | Freq: Once | INTRAMUSCULAR | Status: AC
  Administered 2024-06-14: 1 mg via INTRAVENOUS
  Filled 2024-06-14: qty 1

## 2024-06-14 MED ORDER — SODIUM CHLORIDE 0.9 % IV SOLN: 2.0000 g | INTRAVENOUS | Status: DC

## 2024-06-14 MED ORDER — KETOROLAC TROMETHAMINE 15 MG/ML IJ SOLN
15.0000 mg | Freq: Two times a day (BID) | INTRAMUSCULAR | Status: DC
  Administered 2024-06-14 – 2024-06-15 (×4): 15 mg via INTRAVENOUS
  Filled 2024-06-14 (×4): qty 1

## 2024-06-14 MED ORDER — ONDANSETRON HCL 4 MG/2ML IJ SOLN
4.0000 mg | Freq: Four times a day (QID) | INTRAMUSCULAR | Status: DC | PRN
  Administered 2024-06-14: 4 mg via INTRAVENOUS
  Filled 2024-06-14: qty 2

## 2024-06-14 MED ORDER — LORAZEPAM 2 MG/ML IJ SOLN
0.5000 mg | Freq: Once | INTRAMUSCULAR | Status: AC
  Administered 2024-06-14: 0.5 mg via INTRAVENOUS
  Filled 2024-06-14: qty 1

## 2024-06-14 MED ORDER — MORPHINE SULFATE (PF) 4 MG/ML IV SOLN
4.0000 mg | Freq: Once | INTRAVENOUS | Status: AC
  Administered 2024-06-14: 4 mg via INTRAVENOUS
  Filled 2024-06-14: qty 1

## 2024-06-14 MED ORDER — ACETAMINOPHEN 325 MG PO TABS
650.0000 mg | ORAL_TABLET | Freq: Four times a day (QID) | ORAL | Status: DC | PRN
  Administered 2024-06-16 – 2024-06-17 (×2): 650 mg via ORAL
  Filled 2024-06-14 (×2): qty 2

## 2024-06-14 MED ORDER — MAGNESIUM SULFATE 2 GM/50ML IV SOLN
2.0000 g | Freq: Once | INTRAVENOUS | Status: AC
  Administered 2024-06-14: 2 g via INTRAVENOUS
  Filled 2024-06-14: qty 50

## 2024-06-14 MED ORDER — ACETAMINOPHEN 650 MG RE SUPP
650.0000 mg | Freq: Four times a day (QID) | RECTAL | Status: DC | PRN
Start: 2024-06-14 — End: 2024-06-18

## 2024-06-14 MED ORDER — SODIUM CHLORIDE 0.9 % IV SOLN: INTRAVENOUS | Status: AC

## 2024-06-14 MED ORDER — ONDANSETRON HCL 4 MG/2ML IJ SOLN
4.0000 mg | Freq: Four times a day (QID) | INTRAMUSCULAR | Status: DC
  Administered 2024-06-14 – 2024-06-18 (×16): 4 mg via INTRAVENOUS
  Filled 2024-06-14 (×17): qty 2

## 2024-06-14 MED ORDER — HYDROMORPHONE HCL 1 MG/ML IJ SOLN
1.5000 mg | INTRAMUSCULAR | Status: DC | PRN
  Administered 2024-06-14 – 2024-06-18 (×25): 1.5 mg via INTRAVENOUS
  Filled 2024-06-14 (×25): qty 1.5

## 2024-06-14 MED ORDER — PIPERACILLIN-TAZOBACTAM 3.375 G IVPB
3.3750 g | Freq: Three times a day (TID) | INTRAVENOUS | Status: DC
  Administered 2024-06-14 – 2024-06-18 (×12): 3.375 g via INTRAVENOUS
  Filled 2024-06-14 (×13): qty 50

## 2024-06-14 MED ORDER — SODIUM CHLORIDE 0.9 % IV SOLN: Freq: Once | INTRAVENOUS | Status: AC

## 2024-06-14 MED ORDER — SODIUM CHLORIDE 0.9% FLUSH
3.0000 mL | Freq: Two times a day (BID) | INTRAVENOUS | Status: DC
  Administered 2024-06-14 – 2024-06-17 (×6): 3 mL via INTRAVENOUS

## 2024-06-14 MED ORDER — BOOST / RESOURCE BREEZE PO LIQD CUSTOM
1.0000 | Freq: Three times a day (TID) | ORAL | Status: DC
  Administered 2024-06-14 – 2024-06-16 (×2): 1 via ORAL
  Filled 2024-06-14: qty 1

## 2024-06-14 MED ORDER — ENOXAPARIN SODIUM 40 MG/0.4ML IJ SOSY
40.0000 mg | PREFILLED_SYRINGE | INTRAMUSCULAR | Status: DC
  Administered 2024-06-14 – 2024-06-15 (×2): 40 mg via SUBCUTANEOUS
  Filled 2024-06-14 (×2): qty 0.4

## 2024-06-14 MED ORDER — SODIUM CHLORIDE 0.9 % IV BOLUS
1000.0000 mL | Freq: Once | INTRAVENOUS | Status: AC
  Administered 2024-06-14: 1000 mL via INTRAVENOUS

## 2024-06-14 MED ORDER — SODIUM CHLORIDE 0.9 % IV SOLN
12.5000 mg | Freq: Four times a day (QID) | INTRAVENOUS | Status: DC | PRN
  Administered 2024-06-14 – 2024-06-15 (×2): 12.5 mg via INTRAVENOUS
  Filled 2024-06-14: qty 12.5

## 2024-06-14 MED ORDER — OXYCODONE HCL 5 MG PO TABS
10.0000 mg | ORAL_TABLET | ORAL | Status: DC | PRN
  Administered 2024-06-14 – 2024-06-18 (×21): 10 mg via ORAL
  Filled 2024-06-14 (×21): qty 2

## 2024-06-14 NOTE — ED Notes (Signed)
 CCMD called and patient placed on monitor

## 2024-06-14 NOTE — H&P (Signed)
 History and Physical    Patient: Katrina Lambert FMW:[id number] DOB: 1986/03/23 DOA: 06/13/2024 DOS: the patient was seen and examined on 06/14/2024 PCP: [facility]  Patient coming from: Home  Chief Complaint:  Chief Complaint  Patient presents with   Abdominal Pain   HPI: Katrina Lambert is a 38 y.o. female with medical history significant of remote alcohol abuse and bipolar disorder.  Patient has a history of severe complicated pancreatitis dating back to May 2025.  Her most recent hospitalization was in July where she was treated at [facility].  She had initially been treated here by [facility] for complexity of her status it was felt she would benefit from transfer to [facility].  It was noted that she had severe pancreatitis with pseudocyst as well as healthcare acquired pneumonia with a para pneumonic effusion that required a chest tube.  During that hospitalization a pancreatic stent was put she reports that at time of urgent on July 23 her pain had resolved and she was able to eat regular food.  She was nervous about attempting heavier foods at home so she stayed away from those after discharge.  In an effort to make sure she received in a protein she was drinking protein shakes.  She does report that after discharge her prescription for antibiotics were filled late and she did not receive them until 2 days after discharge.  She reports about 3 to 4 days ago she developed significant heartburn.  On 7/29 the heartburn resolved but later in the evening she developed sharp left upper quadrant pain.  She did have leftover Zofran  that she took for the nausea but her symptoms did not improve.  She had a Tmax of 101.6 F.  The pain became very severe and she presented to the ED for treatment.   In the ED her Tmax was 99.1, she had a white count of 20,600, CT of the abdomen and pelvis revealed decreasing size of the fluid collection under the left hemidiaphragm now measuring 4.9 x  2.9 cm compared with 8 x 5.8 cm previously.  Unfortunately the fluid collection lateral to the left upper quadrant now measures 5.1 x 2.1 cm which is larger than the prior study when this was not measurable on previous exam.  There was also a fluid collection laterally lower in the left abdomen that is unchanged in size.  Her lipase was 320.  Because of the leukocytosis she was started on Rocephin  2 g IV.  She was given IV Dilaudid  for pain and Zofran  for nausea.  Hospitalist service was consulted to evaluate the patient for admission.   Review of Systems: As above.  In addition she denies UTI symptoms.  She has not drunk any alcohol in over 2 years.  Past Medical History:  Diagnosis Date   Anxiety    Arthritis    bilateral hips and knees   Asthma    Bipolar disorder (HCC)    Depression    Dyspnea    GERD (gastroesophageal reflux disease)    Headache    Hypertension    Neuromuscular disorder (HCC)    PONV (postoperative nausea and vomiting)    Pre-diabetes    Past Surgical History:  Procedure Laterality Date   ANTERIOR CERVICAL DECOMP/DISCECTOMY FUSION N/A 06/02/2021   Procedure: ANTERIOR CERVICAL DISCECTOMY FUSION CERVICAL FOUR-FIVE, CERVICAL FIVE-SIX, CERVICAL SIX-SEVEN WITH CERVICAL SIX CORPECTOMY;  Surgeon: Dawley, Lani BROCKS, DO;  Location: [facility] OR;  Service: Neurosurgery;  Laterality: N/A;  BIOPSY OF SKIN SUBCUTANEOUS TISSUE AND/OR MUCOUS MEMBRANE  05/24/2024   Procedure: BIOPSY, SKIN, SUBCUTANEOUS TISSUE, OR MUCOUS MEMBRANE;  Surgeon: Mansouraty, Aloha Raddle., MD;  Location: [facility] ENDOSCOPY;  Service: Gastroenterology;;   CESAREAN SECTION     x 2   CHOLECYSTECTOMY     ESOPHAGOGASTRODUODENOSCOPY N/A 05/24/2024   Procedure: EGD (ESOPHAGOGASTRODUODENOSCOPY);  Surgeon: Wilhelmenia Aloha Raddle., MD;  Location: [facility] ENDOSCOPY;  Service: Gastroenterology;  Laterality: N/A;   ESOPHAGOGASTRODUODENOSCOPY N/A 05/30/2024   Procedure: EGD (ESOPHAGOGASTRODUODENOSCOPY);  Surgeon: Wilhelmenia Aloha Raddle., MD;   Location: [facility] ENDOSCOPY;  Service: Gastroenterology;  Laterality: N/A;   EUS N/A 05/24/2024   Procedure: ULTRASOUND, UPPER GI TRACT, ENDOSCOPIC;  Surgeon: Wilhelmenia Aloha Raddle., MD;  Location: [facility] ENDOSCOPY;  Service: Gastroenterology;  Laterality: N/A;   EUS N/A 05/30/2024   Procedure: ULTRASOUND, UPPER GI TRACT, ENDOSCOPIC;  Surgeon: Wilhelmenia Aloha Raddle., MD;  Location: [facility] ENDOSCOPY;  Service: Gastroenterology;  Laterality: N/A;  and pancreatic pseudocyst aspiration   FINE NEEDLE ASPIRATION  05/24/2024   Procedure: FINE NEEDLE ASPIRATION;  Surgeon: Wilhelmenia Aloha Raddle., MD;  Location: [facility] ENDOSCOPY;  Service: Gastroenterology;;   FINE NEEDLE ASPIRATION BIOPSY  05/30/2024   Procedure: FINE NEEDLE ASPIRATION BIOPSY;  Surgeon: Wilhelmenia Aloha Raddle., MD;  Location: [facility] ENDOSCOPY;  Service: Gastroenterology;;   POSTERIOR CERVICAL FUSION/FORAMINOTOMY N/A 06/06/2021   Procedure: CERVICAL FOUR-FIVE, CERVICAL FIVE-SIX, CERVICAL SIX-SEVEN, CERVICAL SEVEN-THORACIC ONE POSTERIOR CERVICAL INSTRUMENTATION AND FUSION WITH CERVICAL FOUR-CERVICAL SEVEN LAMINECTOMY/DECOMPRESSION;  Surgeon: Carollee Lani BROCKS, DO;  Location: [facility] OR;  Service: Neurosurgery;  Laterality: N/A;   Social History:  reports that she has been smoking cigarettes. She has a 33 pack-year smoking history. She has never used smokeless tobacco. She reports that she does not currently use alcohol. She reports current drug use. Frequency: 7.00 times per week. Drug: Marijuana.  No Known Allergies  History reviewed. No pertinent family history.  Prior to Admission medications   Medication Sig Start Date End Date Taking? Authorizing Provider  acetaminophen  (TYLENOL ) 500 MG tablet Take 500 mg by mouth every 6 (six) hours as needed for mild pain (pain score 1-3).    [provider]  cefTRIAXone  2 g in sodium chloride  0.9 % 100 mL Inject 2 g into the vein daily. 06/04/24   Amin, Ankit C, MD  lisinopril  (ZESTRIL ) 10 MG tablet Take 10 mg by mouth  daily.    [provider]    Physical Exam: Vitals:   06/14/24 0045 06/14/24 0100 06/14/24 0145 06/14/24 0230  BP: (!) 154/107 (!) 152/99 (!) 161/104 (!) 143/94  Pulse: (!) 115 (!) 121 (!) 116 (!) 115  Resp: (!) 23   (!) 22  Temp:      SpO2: 98% 99% 100% 97%  Weight:      Height:       Constitutional: NAD, calm, comfortable Respiratory: clear to auscultation bilaterally, no wheezing, no crackles. Normal respiratory effort. No accessory muscle use.  Cardiovascular: Regular rate and rhythm, no murmurs / rubs / gallops. No extremity edema. 2+ pedal pulses..  Abdomen: Focal tenderness left upper quadrant, no masses palpated. No obvious hepatosplenomegaly but difficult to appreciate given abdominal pain experienced by the patient. Bowel sounds positive.  Musculoskeletal: no clubbing / cyanosis. No joint deformity upper and lower extremities. Good ROM, no contractures. Normal muscle tone.  Skin: no rashes, lesions, ulcers. No induration Neurologic: CN 2-12 grossly intact. Sensation intact, Strength 5/5 x all 4 extremities.  Psychiatric: Normal judgment and insight. Alert and oriented x 3.  Somewhat anxious mood  which appears to improve after going over plan of care thoroughly with patient   Data Reviewed:  Sodium 133, potassium 4.0, CO2 18, BUN 13, creatinine 0.82, magnesium  1.6, albumin 2.7, lipase now 331, total protein 6.4  White count now down to 20,400, normal differential, hemoglobin 11, platelets 685,000  Urine pregnancy negative, urinalysis unremarkable  Blood cultures have been obtained  CT abdomen and pelvis as above    Assessment and Plan: History of severe complicated pancreatitis status post stent Recurrent pancreatitis Increased size of previous pseudocyst (3 detected) Unclear if increased pain and fever, solely from change in size of previous small pseudocyst or if reflective of some sort of obstructive process related to her stent GI has been  consulted Continue supportive care with IV Dilaudid  as needed, scheduled twice daily IV Toradol  with PPI, bowel rest, IV fluids Due to leukocytosis which is likely an appropriate inflammatory reaction we have started empiric IV Rocephin  as a precaution Follow-up on blood cultures  Hypertension Blood pressure somewhat suboptimal given dehydration so we will hold home lisinopril   Thrombocytosis Ongoing chronic problem and platelets near baseline Needs pharmacological DVT prophylaxis  Bipolar disorder Apparently not on psychotropic medications at home    Advance Care Planning:   Code Status: Full Code   VTE prophylaxis: Lovenox   Consults: GI  Family Communication: Patient only  Severity of Illness: The appropriate patient status for this patient is INPATIENT. Inpatient status is judged to be reasonable and necessary in order to provide the required intensity of service to ensure the patient's safety. The patient's presenting symptoms, physical exam findings, and initial radiographic and laboratory data in the context of their chronic comorbidities is felt to place them at high risk for further clinical deterioration. Furthermore, it is not anticipated that the patient will be medically stable for discharge from the hospital within 2 midnights of admission.   * I certify that at the point of admission it is my clinical judgment that the patient will require inpatient hospital care spanning beyond 2 midnights from the point of admission due to high intensity of service, high risk for further deterioration and high frequency of surveillance required.*  Author: Isaiah Lever, NP 06/14/2024 7:30 AM  For on call review [email or web address].

## 2024-06-14 NOTE — Consult Note (Signed)
 Consultation  Referring Provider: TRH/Ellis NP Primary Care Physician:  [facility] Primary Gastroenterologist:  Dr. Legrand  Reason for Consultation: Recurrent abdominal pain, nausea, fever and patient with prolonged course with complicated pancreatitis and pseudocysts.  HPI: Katrina Lambert is a 38 y.o. female with history of anxiety, bipolar disorder, GERD, morbid obesity, initially diagnosed with pancreatitis during admission to [facility] in early May 2025.  She had 2 admissions there the second 1 at which time she was also treated for left lower lobe pneumonia.  She is status post previous cholecystectomy. Admitted here 6/1 through 04/26/2024 and seen by us  during that admission at which time she was diagnosed with evolving pseudocysts in setting of idiopathic pancreatitis.  Also seen by ID during that admission not felt to have infected pancreatic pseudocyst but was covered with short course of antibiotics She required chest tube placement 04/18/2024 question of parapneumonic effusion versus effusion related to pancreatitis. Unfortunately readmitted 05/18/2024 due to increase in abdominal pain nausea and vomiting.  She underwent CT and then MRI both showing enlargement of the large lobulated multiloculated cystic lesion in the left upper quadrant arising from her pancreatic tail measuring 15 x 10 cm and abutting the left hemidiaphragm and also abutting the stomach also read as possibly dissecting the stomach and involving the splenic capsule. He was initially started on IV Zosyn , this was stopped after a few days as she underwent aspiration and culture was negative.  She ultimately underwent EUS on 05/24/2024 with aspiration of 800 cc of fluid from the large complex multilobulated fluid collection.  Cytology negative, cultures negative. Unfortunately repeat CT 4 days later showed severe elevation of the left hemidiaphragm, and large complex serpiginous multiloculated fluid  collection occupying the majority of the left upper quadrant about the spleen gastric fundus and tip of the pancreas measuring 13 x 4 x 8.4 cm not significantly changed.  She then underwent repeat EUS and repeat cyst aspiration per Dr. Gaylord 05/30/2024 with 450 cc of fluid removed, again culture negative. We repeated CT within 24 hours and fluid had rapidly reaccumulated raising concern for pancreatic duct disruption.  [facility] was contacted, as Dr. Wilhelmenia.was to be out of town, and arrangements were made for her to be transferred there with plans for pancreatic ERCP and pancreatic duct stent placement.  She was transferred on 06/04/2024 and per notes from the EUS at [facility] she underwent EUS on 06/06/2024, with cyst gastrostomy using an Axios stent-the stent was too long and removed, then, then a 4 cm x 10 French double-pigtail stent was placed into the pseudocyst through the cyst gastrostomy.  Says she felt well after the procedure, was able to eat solid food and definitely felt a significant improvement in her abdominal pain.  She was allowed discharge on 06/08/2023 was to complete a course of Cipro  for 5 days prophylactically.  She reports she was unable to take the Cipro  the first day, but did start it the following day.  She felt well at home for a few days then 2 days ago woke up with left mid and left flank pain which became constant and progressed, also developed nausea and then a fever to 101.6 and presented to the emergency room yesterday afternoon.  Labs here WBC 20.6/hemoglobin 12.4/hematocrit 41 Lipase 320 LFTs within normal limits  CT chest abdomen pelvis-small left effusion, airspace opacity left lower lobe could rub present atelectasis or pneumonia, stranding around the pancreatic tail unchanged.  Subdiaphragmatic left upper quadrant fluid collection  has decreased in size drainage catheter in the region terminating in the stomach. Left upper quadrant lateral fluid collection now  measures 5.1 x 2.1 cm larger than previous, and more inferiorly in the left abdomen laterally there is a 7.1 x 2.2 cm fluid collection not significantly changed since previous.  She has been started on IV Rocephin .   Past Medical History:  Diagnosis Date   Anxiety    Arthritis    bilateral hips and knees   Asthma    Bipolar disorder (HCC)    Depression    Dyspnea    GERD (gastroesophageal reflux disease)    Headache    Hypertension    Neuromuscular disorder (HCC)    PONV (postoperative nausea and vomiting)    Pre-diabetes     Past Surgical History:  Procedure Laterality Date   ANTERIOR CERVICAL DECOMP/DISCECTOMY FUSION N/A 06/02/2021   Procedure: ANTERIOR CERVICAL DISCECTOMY FUSION CERVICAL FOUR-FIVE, CERVICAL FIVE-SIX, CERVICAL SIX-SEVEN WITH CERVICAL SIX CORPECTOMY;  Surgeon: Dawley, Lani BROCKS, DO;  Location: [facility] OR;  Service: Neurosurgery;  Laterality: N/A;   BIOPSY OF SKIN SUBCUTANEOUS TISSUE AND/OR MUCOUS MEMBRANE  05/24/2024   Procedure: BIOPSY, SKIN, SUBCUTANEOUS TISSUE, OR MUCOUS MEMBRANE;  Surgeon: Mansouraty, Aloha Raddle., MD;  Location: [facility] ENDOSCOPY;  Service: Gastroenterology;;   CESAREAN SECTION     x 2   CHOLECYSTECTOMY     ESOPHAGOGASTRODUODENOSCOPY N/A 05/24/2024   Procedure: EGD (ESOPHAGOGASTRODUODENOSCOPY);  Surgeon: Wilhelmenia Aloha Raddle., MD;  Location: [facility] ENDOSCOPY;  Service: Gastroenterology;  Laterality: N/A;   ESOPHAGOGASTRODUODENOSCOPY N/A 05/30/2024   Procedure: EGD (ESOPHAGOGASTRODUODENOSCOPY);  Surgeon: Wilhelmenia Aloha Raddle., MD;  Location: [facility] ENDOSCOPY;  Service: Gastroenterology;  Laterality: N/A;   EUS N/A 05/24/2024   Procedure: ULTRASOUND, UPPER GI TRACT, ENDOSCOPIC;  Surgeon: Wilhelmenia Aloha Raddle., MD;  Location: [facility] ENDOSCOPY;  Service: Gastroenterology;  Laterality: N/A;   EUS N/A 05/30/2024   Procedure: ULTRASOUND, UPPER GI TRACT, ENDOSCOPIC;  Surgeon: Wilhelmenia Aloha Raddle., MD;  Location: [facility] ENDOSCOPY;  Service: Gastroenterology;  Laterality: N/A;   and pancreatic pseudocyst aspiration   FINE NEEDLE ASPIRATION  05/24/2024   Procedure: FINE NEEDLE ASPIRATION;  Surgeon: Wilhelmenia Aloha Raddle., MD;  Location: [facility] ENDOSCOPY;  Service: Gastroenterology;;   FINE NEEDLE ASPIRATION BIOPSY  05/30/2024   Procedure: FINE NEEDLE ASPIRATION BIOPSY;  Surgeon: Wilhelmenia Aloha Raddle., MD;  Location: [facility] ENDOSCOPY;  Service: Gastroenterology;;   POSTERIOR CERVICAL FUSION/FORAMINOTOMY N/A 06/06/2021   Procedure: CERVICAL FOUR-FIVE, CERVICAL FIVE-SIX, CERVICAL SIX-SEVEN, CERVICAL SEVEN-THORACIC ONE POSTERIOR CERVICAL INSTRUMENTATION AND FUSION WITH CERVICAL FOUR-CERVICAL SEVEN LAMINECTOMY/DECOMPRESSION;  Surgeon: Carollee Lani BROCKS, DO;  Location: [facility] OR;  Service: Neurosurgery;  Laterality: N/A;    Prior to Admission medications   Medication Sig Start Date End Date Taking? Authorizing Provider  acetaminophen  (TYLENOL ) 500 MG tablet Take 500 mg by mouth every 6 (six) hours as needed for mild pain (pain score 1-3).    [provider]  cefTRIAXone  2 g in sodium chloride  0.9 % 100 mL Inject 2 g into the vein daily. 06/04/24   Amin, Ankit C, MD  lisinopril  (ZESTRIL ) 10 MG tablet Take 10 mg by mouth daily.    [provider]    Current Facility-Administered Medications  Medication Dose Route Frequency Provider Last Rate Last Admin   0.9 %  sodium chloride  infusion   Intravenous Continuous Howerter, Justin B, DO 125 mL/hr at 06/14/24 0621 New Bag at 06/14/24 0621   acetaminophen  (TYLENOL ) tablet 650 mg  650 mg Oral Q6H PRN Howerter, Justin B, DO  Or   acetaminophen  (TYLENOL ) suppository 650 mg  650 mg Rectal Q6H PRN Howerter, Justin B, DO       enoxaparin  (LOVENOX ) injection 40 mg  40 mg Subcutaneous Q24H Alto Isaiah CROME, NP       feeding supplement (BOOST / RESOURCE BREEZE) liquid 1 Container  1 Container Oral TID BM Kacyn Souder S, PA-C       HYDROmorphone  (DILAUDID ) injection 1 mg  1 mg Intravenous Q2H PRN Howerter, Justin B, DO   1 mg at  06/14/24 1030   ketorolac  (TORADOL ) 15 MG/ML injection 15 mg  15 mg Intravenous Q12H Alto Isaiah CROME, NP   15 mg at 06/14/24 9076   ondansetron  (ZOFRAN ) injection 4 mg  4 mg Intravenous Q6H Alto Isaiah CROME, NP       pantoprazole  (PROTONIX ) injection 40 mg  40 mg Intravenous Q24H Alto Isaiah CROME, NP   40 mg at 06/14/24 9076   promethazine  (PHENERGAN ) 12.5 mg in sodium chloride  0.9 % 50 mL IVPB  12.5 mg Intravenous Q6H PRN Alto Isaiah CROME, NP 150 mL/hr at 06/14/24 1201 12.5 mg at 06/14/24 1201   sodium chloride  flush (NS) 0.9 % injection 3 mL  3 mL Intravenous Q12H Alto Isaiah CROME, NP   3 mL at 06/14/24 1159   Current Outpatient Medications  Medication Sig Dispense Refill   acetaminophen  (TYLENOL ) 500 MG tablet Take 500 mg by mouth every 6 (six) hours as needed for mild pain (pain score 1-3).     cefTRIAXone  2 g in sodium chloride  0.9 % 100 mL Inject 2 g into the vein daily.     lisinopril  (ZESTRIL ) 10 MG tablet Take 10 mg by mouth daily.      Allergies as of 06/13/2024   (No Known Allergies)    History reviewed. No pertinent family history.  Social History   Socioeconomic History   Marital status: Married    Spouse name: Not on file   Number of children: Not on file   Years of education: Not on file   Highest education level: Not on file  Occupational History   Not on file  Tobacco Use   Smoking status: Every Day    Current packs/day: 1.50    Average packs/day: 1.5 packs/day for 22.0 years (33.0 ttl pk-yrs)    Types: Cigarettes   Smokeless tobacco: Never  Vaping Use   Vaping status: Never Used  Substance and Sexual Activity   Alcohol use: Not Currently   Drug use: Yes    Frequency: 7.0 times per week    Types: Marijuana    Comment: dailiy use   Sexual activity: Not on file  Other Topics Concern   Not on file  Social History Narrative   Not on file   Social Drivers of Health   Financial Resource Strain: High Risk (06/04/2024)   Received from [facility]   Overall Financial Resource Strain (CARDIA)    Difficulty of Paying Living Expenses: Very hard  Food Insecurity: Food Insecurity Present (06/04/2024)   Received from [facility]   Hunger Vital Sign    Within the past 12 months, you worried that your food would run out before you got the money to buy more.: Sometimes true    Within the past 12 months, the food you bought just didn't last and you didn't have money to get more.: Sometimes true  Transportation Needs: Unmet Transportation Needs (06/05/2024)   Received from [facility]  PRAPARE - Transportation    In the past 12 months, has lack of transportation kept you from medical appointments or from getting medications?: Yes    Lack of Transportation (Non-Medical): No  Physical Activity: Inactive (03/18/2024)   Received from [facility]   Exercise Vital Sign    On average, how many days per week do you engage in moderate to strenuous exercise (like a brisk walk)?: 0 days    On average, how many minutes do you engage in exercise at this level?: 0 min  Stress: Stress Concern Present (03/18/2024)   Received from [facility] - Occupational Stress Questionnaire    Feeling of Stress : Rather much  Social Connections: Socially Isolated (03/18/2024)   Received from [facility]   Social Connection and Isolation Panel    In a typical week, how many times do you talk on the phone with family, friends, or neighbors?: More than three times a week    How often do you get together with friends or relatives?: More than three times a week    How often do you attend church or religious services?: Never    Do you belong to any clubs or organizations such as church groups, unions, fraternal or athletic groups, or school groups?: No    How often do you attend meetings of the clubs or organizations you belong to?: Never    Are you married, widowed, divorced,  separated, never married, or living with a partner?: Separated  Intimate Partner Violence: Not At Risk (05/18/2024)   Humiliation, Afraid, Rape, and Kick questionnaire    Fear of Current or Ex-Partner: No    Emotionally Abused: No    Physically Abused: No    Sexually Abused: No    Review of Systems: Pertinent positive and negative review of systems were noted in the above HPI section.  All other review of systems was otherwise negative.  Physical Exam: Vital signs in last 24 hours: Temp:  [98.3 F (36.8 C)-99.1 F (37.3 C)] 98.3 F (36.8 C) (07/30 0731) Pulse Rate:  [115-128] 120 (07/30 1100) Resp:  [15-25] 15 (07/30 1100) BP: (130-161)/(83-107) 130/83 (07/30 1200) SpO2:  [93 %-100 %] 99 % (07/30 1100) Weight:  [108.9 kg] 108.9 kg (07/29 1817)   General:   Alert,  Well-developed, obese young white female pleasant and cooperative in NAD, uncomfortable appearing Head:  Normocephalic and atraumatic. Eyes:  Sclera clear, no icterus.   Conjunctiva pink. Ears:  Normal auditory acuity. Nose:  No deformity, discharge,  or lesions. Mouth:  No deformity or lesions.   Neck:  Supple; no masses or thyromegaly. Lungs: No increased work of breathing, decreased breath sounds left base  heart: Tachycardic rate and rhythm; no murmurs, clicks, rubs,  or gallops. Abdomen:  Soft, she is tender across the upper abdomen and more so in the left upper quadrant left mid quadrant and down into the left flank, no palpable mass or hepatosplenomegaly, bowel sounds are present Rectal: Not done Msk:  Symmetrical without gross deformities. . Pulses:  Normal pulses noted. Extremities:  Without clubbing or edema. Neurologic:  Alert and  oriented x4;  grossly normal neurologically. Skin:  Intact without significant lesions or rashes.. Psych:  Alert and cooperative. Normal mood and affect.  Intake/Output from previous day: No intake/output data recorded. Intake/Output this shift: No intake/output data  recorded.  Lab Results: Recent Labs    06/13/24 1934 06/14/24 0651  WBC 20.6* 20.4*  HGB 12.4 11.0*  HCT 41.0 36.4  PLT 755* 685*   BMET Recent Labs    06/13/24 1934 06/14/24 0651  NA 138 133*  K 4.2 4.0  CL 102 103  CO2 26 18*  GLUCOSE 104* 97  BUN 12 13  CREATININE 0.79 0.82  CALCIUM 10.0 8.5*   LFT Recent Labs    06/14/24 0651  PROT 6.4*  ALBUMIN 2.7*  AST 15  ALT 20  ALKPHOS 93  BILITOT 0.5   PT/INR No results for input(s): LABPROT, INR in the last 72 hours. Hepatitis Panel No results for input(s): HEPBSAG, HCVAB, HEPAIGM, HEPBIGM in the last 72 hours.    IMPRESSION:  #41 38 year old white female with long complicated course after initial episode of severe pancreatitis felt to be idiopathic in May 2025.  Patient status post previous cholecystectomy. Prolonged hospitalizations with pancreatic pseudocysts.  Patient had 2 EUS's with cyst aspiration of the largest pseudocyst during her last admission here, no evidence of abscess/infection but she did have rapid reaccumulation of the fluid suggestive of pancreatic duct disruption. [facility] was contacted for pancreatic ERCP and pancreatic duct stent placement.  She was transferred there on 06/04/2024 and ultimately underwent EUS with cystogastrostomy on 06/06/2024. She did not have pancreatic duct stent placement.  She was discharged home the following day, did well for about 4 days after discharge from [facility] and then developed more left upper quadrant and left lateral abdominal pain into her left flank.  Associated nausea and then fever early yesterday to 101.6 and return to the ER.  She does have a significant leukocytosis, and tachycardia, consistent with SIRS  CT imaging shows the previous dominant pseudocyst significantly decreased in size however the left upper quadrant lateral fluid collection now measures 5.1 x 2.1 cm larger than previous and the more inferior left abdominal fluid collection has not  significantly changed in size.  She does have atelectasis but no definite pneumonia on CT left lower lobe.  Obvious concern at this point is for infection of 1 or more of the fluid collections.  The left upper quadrant collection has increased in size again concerning for pancreatic duct disruption.  Plan; clear liquid diet Have started IV Zosyn  Blood cultures Pain control, antiemetics Will discuss further management with advanced biliary endoscopist Dr. Wilhelmenia.  She may need the left upper quadrant fluid collection aspirated. GI will follow with you.      Sumiye Hirth PA-C 06/14/2024, 12:13 PM

## 2024-06-14 NOTE — Progress Notes (Signed)
  Carryover admission to the Day Admitter.  I discussed this case with the EDP, Lonni Lites, PA.  Per these discussions:   This is a 38 year old female with history of prior hospitalizations for acute pancreatitis, who is being admitted this evening for acute pancreatitis after presenting with 2 days of progressive epigastric discomfort associated with nausea/vomiting.  She was hospitalized to the [facility] from 03/17/2024 to 03/21/2024 for acute pancreatitis, before being discharged home.  She was subsequently admitted to [facility] on Apr 15, 2024 for acute pancreatitis complicated by pseudocyst, with hospitalization complicated by HCAP pneumonia parapneumonic effusion prompting chest tube placement.  She was hospitalized again in July 2025 for acute pancreatitis.  Over the course of these hospitalizations, she underwent placement of pancreatic stent.   This evening, she is mildly tachycardic.    Lipase is 320, lactic acid 1.5.  No evidence of transaminitis.  CT abdomen/pelvis interval decrease in size of pancreatic pseudocyst, as well as patent appearance of pancreatic stent.   She has received 2 L of IV fluid, continues with normal saline, and multiple doses of IV pain medication, but continues to have persistent epigastric discomfort  I have placed an order for inpatient admission for further evaluation management of presenting acute pancreatitis.  I have placed some additional preliminary admit orders via the adult multi-morbid admission order set. I have also ordered n.p.o., as needed Zofran , as needed IV Dilaudid , continues normal saline at 125 cc/h x 1 day.  Additionally, have ordered morning labs in the form of CMP, CBC, magnesium  level as well as repeat lipase in the morning.    Eva Pore, DO Hospitalist

## 2024-06-15 DIAGNOSIS — K8502 Idiopathic acute pancreatitis with infected necrosis: Secondary | ICD-10-CM | POA: Diagnosis not present

## 2024-06-15 DIAGNOSIS — R188 Other ascites: Secondary | ICD-10-CM

## 2024-06-15 LAB — GLUCOSE, CAPILLARY
Glucose-Capillary: 96 mg/dL (ref 70–99)
Glucose-Capillary: 134 mg/dL — ABNORMAL HIGH (ref 70–99)
Glucose-Capillary: 126 mg/dL — ABNORMAL HIGH (ref 70–99)
Glucose-Capillary: 118 mg/dL — ABNORMAL HIGH (ref 70–99)

## 2024-06-15 LAB — CBC
HCT: 34.6 % — ABNORMAL LOW (ref 36.0–46.0)
Hemoglobin: 10.6 g/dL — ABNORMAL LOW (ref 12.0–15.0)
MCH: 24.7 pg — ABNORMAL LOW (ref 26.0–34.0)
MCHC: 30.6 g/dL (ref 30.0–36.0)
MCV: 80.7 fL (ref 80.0–100.0)
Platelets: 507 K/uL — ABNORMAL HIGH (ref 150–400)
RBC: 4.29 MIL/uL (ref 3.87–5.11)
RDW: 16.4 % — ABNORMAL HIGH (ref 11.5–15.5)
WBC: 37 K/uL — ABNORMAL HIGH (ref 4.0–10.5)
nRBC: 0 % (ref 0.0–0.2)

## 2024-06-15 MED ORDER — SODIUM CHLORIDE 0.9 % IV SOLN: INTRAVENOUS | Status: DC

## 2024-06-15 MED ORDER — DIPHENHYDRAMINE HCL 25 MG PO CAPS
25.0000 mg | ORAL_CAPSULE | Freq: Four times a day (QID) | ORAL | Status: DC | PRN
  Administered 2024-06-15 – 2024-06-16 (×2): 25 mg via ORAL
  Filled 2024-06-15 (×2): qty 1

## 2024-06-15 NOTE — TOC Initial Note (Signed)
 Transition of Care [facility]) - Initial/Assessment Note    Patient Details  Name: Katrina Lambert MRN: [id number] Date of Birth: December 11, 1985  Transition of Care [facility]) CM/SW Contact:    Andrez JULIANNA George, RN Phone Number: 06/15/2024, 12:04 PM  Clinical Narrative:                  Patient is from home with her daughter. Her daughter is with her most of the time.  Cane/ walker/ BSC at home. She manages her own medications and denies any issues.  She does state she has issues with driving and would like transportation information. Her insurance provides transportation services. CM added this information to her AVS.  IP Care management following for further d/c needs.   Expected Discharge Plan: Home/Self Care Barriers to Discharge: Continued Medical Work up   Patient Goals and CMS Choice            Expected Discharge Plan and Services   Discharge Planning Services: CM Consult   Living arrangements for the past 2 months: Single Family Home                                      Prior Living Arrangements/Services Living arrangements for the past 2 months: Single Family Home Lives with:: Adult Children Patient language and need for interpreter reviewed:: Yes Do you feel safe going back to the place where you live?: Yes        Care giver support system in place?: Yes (comment)   Criminal Activity/Legal Involvement Pertinent to Current Situation/Hospitalization: No - Comment as needed  Activities of Daily Living   ADL Screening (condition at time of admission) Independently performs ADLs?: Yes (appropriate for developmental age) Is the patient deaf or have difficulty hearing?: No Does the patient have difficulty seeing, even when wearing glasses/contacts?: No Does the patient have difficulty concentrating, remembering, or making decisions?: No  Permission Sought/Granted                  Emotional Assessment Appearance:: Appears stated age Attitude/Demeanor/Rapport:  Engaged Affect (typically observed): Accepting Orientation: : Oriented to Self, Oriented to Place, Oriented to  Time, Oriented to Situation   Psych Involvement: No (comment)  Admission diagnosis:  Acute pancreatitis [K85.90] Acute pancreatitis, unspecified complication status, unspecified pancreatitis type [K85.90] Patient Active Problem List   Diagnosis Date Noted   Acquired splenic cyst 05/30/2024   Generalized abdominal pain 05/30/2024   Pseudocyst of pancreas 05/30/2024   Peripancreatic fluid collection 05/30/2024   LUQ pain 05/30/2024   Pancreatic pseudocyst 05/25/2024   Gastritis without bleeding 05/25/2024   Malnutrition of moderate degree 05/24/2024   Acute pancreatic fluid collection 05/18/2024   Pancreatic abscess 05/17/2024   Encounter for smoking cessation counseling 05/17/2024   Protein-calorie malnutrition, severe 04/18/2024   Acute pancreatitis 04/16/2024   Intra-abdominal abscess (HCC) 04/16/2024   Pleural effusion on left 04/16/2024   Hospital-acquired pneumonia 04/16/2024   Tobacco use disorder 04/16/2024   Left upper quadrant pain 04/16/2024   Nausea in adult 04/16/2024   Loss of appetite 04/16/2024   Abnormal loss of weight 04/16/2024   Sepsis due to pneumonia (HCC) 04/15/2024   Alcohol use 09/11/2022   Major depressive disorder, single episode, severe (HCC) 09/11/2022   Marijuana use 09/11/2022   Cervical myelopathy (HCC) 06/02/2021   PCP:  [facility] Pharmacy:   [facility] - [place], [place] - [phone]  [address] [place] [address] Phone: [phone] Fax: [phone]  [facility] [place], [place] - [address] [place] [address] Phone: [phone] Fax: [phone]     Social Drivers of Health (SDOH) Social History: SDOH Screenings   Food Insecurity: No Food Insecurity (06/14/2024)  Recent Concern: Food Insecurity - Food Insecurity Present (06/04/2024)    Received from [facility]  Housing: Low Risk  (06/14/2024)  Recent Concern: Housing - High Risk (06/05/2024)   Received from [facility]  Transportation Needs: No Transportation Needs (06/14/2024)  Recent Concern: Transportation Needs - Unmet Transportation Needs (06/05/2024)   Received from [facility]  Utilities: Not At Risk (06/14/2024)  Recent Concern: Utilities - At Risk (06/04/2024)   Received from [facility]  Financial Resource Strain: High Risk (06/04/2024)   Received from [facility]  Physical Activity: Inactive (03/18/2024)   Received from [facility]  Social Connections: Socially Isolated (03/18/2024)   Received from [facility]  Stress: Stress Concern Present (03/18/2024)   Received from [facility]  Tobacco Use: High Risk (06/13/2024)  Health Literacy: Low Risk  (03/18/2024)   Received from [facility]   SDOH Interventions:     Readmission Risk Interventions    05/18/2024    2:37 PM  Readmission Risk Prevention Plan  Transportation Screening Complete  PCP or Specialist Appt within 5-7 Days Complete  Home Care Screening Complete  Medication Review (RN CM) Referral to Pharmacy

## 2024-06-15 NOTE — Progress Notes (Signed)
 [facility] Progress Note  CC:  Recurrent abdominal pain, nausea, fever and patient with prolonged course with complicated pancreatitis and pseudocysts.  Subjective:  Feels about the same.  Pain meds do help.  Is asking for clear liquids, usually does ok with those.  Temp 100.1.  WBC count 37K.   Objective:  Vital signs in last 24 hours: Temp:  [99.1 F (37.3 C)-99.9 F (37.7 C)] 99.1 F (37.3 C) (07/31 1142) Pulse Rate:  [128-143] 128 (07/31 1142) Resp:  [13-20] 18 (07/31 1142) BP: (108-154)/(65-104) 108/65 (07/31 1142) SpO2:  [94 %-98 %] 94 % (07/31 1142) Weight:  [109.5 kg] 109.5 kg (07/31 0500) Last BM Date :  (PTA) General:  Alert, Well-developed, in NAD Heart:  Tachy; no murmurs Pulm:  CTAB.   Abdomen:  Soft, TTP in the upper abdomen, particularly the LUQ. Extremities:  Without edema. Neurologic:  Alert and oriented x 4;  grossly normal neurologically. Psych:  Alert and cooperative. Normal mood and affect.  Intake/Output from previous day: 07/30 0701 - 07/31 0700 In: 2199 [I.V.:2199] Out: -  Intake/Output this shift: Total I/O In: 21.7 [I.V.:21.7] Out: -   Lab Results: Recent Labs    06/13/24 1934 06/14/24 0651 06/15/24 0424  WBC 20.6* 20.4* 37.0*  HGB 12.4 11.0* 10.6*  HCT 41.0 36.4 34.6*  PLT 755* 685* 507*   BMET Recent Labs    06/13/24 1934 06/14/24 0651  NA 138 133*  K 4.2 4.0  CL 102 103  CO2 26 18*  GLUCOSE 104* 97  BUN 12 13  CREATININE 0.79 0.82  CALCIUM 10.0 8.5*   LFT Recent Labs    06/14/24 0651  PROT 6.4*  ALBUMIN 2.7*  AST 15  ALT 20  ALKPHOS 93  BILITOT 0.5   CT CHEST ABDOMEN PELVIS W CONTRAST Result Date: 06/13/2024 CLINICAL DATA:  abdominal pain, post-surgery from previous pancreatitis EXAM: CT CHEST, ABDOMEN, AND PELVIS WITH CONTRAST TECHNIQUE: Multidetector CT imaging of the chest, abdomen and pelvis was performed following the standard protocol during bolus administration of intravenous contrast.  RADIATION DOSE REDUCTION: This exam was performed according to the departmental dose-optimization program which includes automated exposure control, adjustment of the mA and/or kV according to patient size and/or use of iterative reconstruction technique. CONTRAST:  OMNIPAQUE  IOHEXOL  350 MG/ML SOLN COMPARISON:  06/01/2024 FINDINGS: CT CHEST FINDINGS Cardiovascular: Heart is normal size. Aorta is normal caliber. Mediastinum/Nodes: No mediastinal, hilar, or axillary adenopathy. Trachea and esophagus are unremarkable. Thyroid unremarkable. Lungs/Pleura: Small left pleural effusion. Airspace opacity in the left lower lobe and lingula could reflect atelectasis or pneumonia. No confluent opacity or effusion on the right. Musculoskeletal: Chest wall soft tissues are unremarkable. No acute bony abnormality. CT ABDOMEN PELVIS FINDINGS Hepatobiliary: No focal liver abnormality is seen. Status post cholecystectomy. No biliary dilatation. Pancreas: Residual stranding around the tail of the pancreas is again noted and unchanged. Cystic area in the tail of pancreas 16 x 11 mm, stable. Spleen: No focal abnormality.  Normal size. Adrenals/Urinary Tract: No adrenal abnormality. No focal renal abnormality. No stones or hydronephrosis. Urinary bladder is unremarkable. Stomach/Bowel: Stomach, large and small bowel grossly unremarkable. Vascular/Lymphatic: Aortic atherosclerosis. No evidence of aneurysm or adenopathy. Reproductive: Uterus and adnexa unremarkable. No mass. IUD in the uterus. Other: Left upper quadrant drainage catheter noted terminating in the stomach. Decreasing size of the fluid collection under the left hemidiaphragm now measuring 4.9 x 2.9 cm compared to 8 x 5.8 cm previously. Fluid collection lateral in the  left upper quadrant now measures 5.1 x 2.1 cm, larger than prior study when this was not measurable. Fluid collection laterally lower in the left abdomen measures 7.1 x 2.2 cm compared to 7.3 x 1.8 cm, not  significantly changed. 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 Moderate degenerative disc and facet disease at L4-5 and L5-S1. IMPRESSION: Stranding around the pancreatic tail again noted, unchanged. Subdiaphragmatic left upper quadrant fluid collection has decreased in size. There is a drainage catheter in the region terminating in the stomach. Left upper quadrant lateral fluid collection measures 5.1 x 2.1 cm, slightly larger than prior study. More inferiorly in the left abdomen laterally, 7.1 x 2.2 cm fluid collection not significantly changed since prior study. Small left pleural effusion. Compressive atelectasis or pneumonia in the left lower lobe and lingula. Electronically Signed   By: Franky Crease M.D.   On: 06/13/2024 21:15    Assessment / Plan: #21 38 year old white female with long complicated course after initial episode of severe pancreatitis felt to be idiopathic in May 2025.  Patient status post previous cholecystectomy. Prolonged hospitalizations with pancreatic pseudocysts.   Patient had 2 EUS's with cyst aspiration of the largest pseudocyst during her last admission here, no evidence of abscess/infection but she did have rapid reaccumulation of the fluid suggestive of pancreatic duct disruption. [facility] was contacted for pancreatic ERCP and pancreatic duct stent placement.  She was transferred there on 06/04/2024 and ultimately underwent EUS with cystogastrostomy on 06/06/2024. She did not have pancreatic duct stent placement.   She was discharged home the following day, did well for about 4 days after discharge from [facility] and then developed more left upper quadrant and left lateral abdominal pain into her left flank.  Associated nausea and then fever early yesterday to 101.6 and return to the ER.   She does have a significant leukocytosis, and tachycardia, consistent with SIRS   CT imaging shows  the previous dominant pseudocyst significantly decreased in size however the left upper quadrant lateral fluid collection now measures 5.1 x 2.1 cm larger than previous and the more inferior left abdominal fluid collection has not significantly changed in size.   She does have atelectasis but no definite pneumonia on CT left lower lobe.   Obvious concern at this point is for infection of 1 or more of the fluid collections.  The left upper quadrant collection has increased in size again concerning for pancreatic duct disruption.  White blood cell count trending up at 37K today.  Blood cultures negative so far.  Temp 100.1.   -Clear liquid diet -IV Zosyn  -Pain control, antiemetics -Possible transfer back to [facility] for PD stent vs aspiration/drainage of fluid collection percutaneously or via EUS.  -Trend labs.    LOS: 1 day   Harlene BIRCH. Porter Nakama  06/15/2024, 1:19 PM

## 2024-06-15 NOTE — Plan of Care (Signed)
  Problem: Education: Goal: Knowledge of General Education information will improve Description: Including pain rating scale, medication(s)/side effects and non-pharmacologic comfort measures Outcome: Progressing   Problem: Health Behavior/Discharge Planning: Goal: Ability to manage health-related needs will improve Outcome: Progressing   Problem: Activity: Goal: Risk for activity intolerance will decrease Outcome: Progressing   Problem: Coping: Goal: Level of anxiety will decrease Outcome: Progressing   Problem: Pain Managment: Goal: General experience of comfort will improve and/or be controlled Outcome: Progressing   Problem: Safety: Goal: Ability to remain free from injury will improve Outcome: Progressing

## 2024-06-15 NOTE — Progress Notes (Signed)
   06/15/24 1100  Spiritual Encounters  Type of Visit Initial  Care provided to: Patient  Referral source Patient request  Reason for visit Routine spiritual support  OnCall Visit No  Spiritual Framework  Presenting Themes Significant life change;Impactful experiences and emotions  Interventions  Spiritual Care Interventions Made Established relationship of care and support;Compassionate presence;Reflective listening;Normalization of emotions  Intervention Outcomes  Outcomes Connection to spiritual care;Awareness of support;Awareness of health;Reduced isolation;Reduced anxiety  Spiritual Care Plan  Spiritual Care Issues Still Outstanding No further spiritual care needs at this time (see row info)   Chaplain responded to spiritual consult for prayer.  Patient was welcoming and shared the concerns on her heart surrounding the diagnosis.  She was tearful and is finding it difficult to remain hopeful.  Chaplain visited, listened and prayed with compassion.

## 2024-06-15 NOTE — Progress Notes (Signed)
 PROGRESS NOTE    Katrina Lambert  FMW:[id number] DOB: [birth year] DOA: 06/13/2024 PCP: [facility]    Brief Narrative:  Severe complicated pancreatitis started 03/2024.  Pseudocyst, infection, most recent hospitalization at [facility] treated with cystogastrostomy.  Recent hospitalization with complicated pleural effusion treated with chest tube.  Discharged from [facility] from 7/23.  Had some relief for few days but again recurred so came back to hospital 7/30.  Came back with severe pain and low-grade fever.     In the ED her Tmax was 99.1, she had a white count of 20,600, CT of the abdomen and pelvis revealed decreasing size of the fluid collection under the left hemidiaphragm now measuring 4.9 x 2.9 cm compared with 8 x 5.8 cm previously.  Unfortunately the fluid collection lateral to the left upper quadrant now measures 5.1 x 2.1 cm which is larger than the prior study when this was not measurable on previous exam.  There was also a fluid collection laterally lower in the left abdomen that is unchanged in size.  Her lipase was 320.  Because of the leukocytosis she was started on Rocephin  2 g IV.  She was given IV Dilaudid  for pain and Zofran  for nausea.  Admitted along with GI consultation.      Subjective: Patient seen and examined.  Continues to have discomfort and pain mostly epigastrium and left lateral quadrants.  Unable to take deep breaths.  Afebrile.  WBC count 37,000.  Blood cultures negative so far.  Electrolytes are adequate. Pain control remains the issue and now alternating between Dilaudid  and oxycodone  10 mg. Remains NPO.  She just wants to drink some water .  Does not feel like she is ready to eat. Case discussed with gastroenterology.   Assessment & Plan:   Severe complicated and recurrent pancreatitis Pseudocyst, suspected complicated and infected.  N.p.o., IV fluids, adequate pain medication with IV and oral opiates.  IV Zosyn  for empiric treatment.  Blood cultures  negative so far. Continue monitoring electrolytes and white Count. GI following with advanced endoscopy team about benefit of the drainage.  If she continues to have elevated WBC count without improvement, she may need percutaneous or repeatedly endoscopic drainage of the collections. Incentive spirometry, deep breathing exercises mobility to continue  Essential hypertension: Holding lisinopril  with risk of dehydration.  Blood pressures adequate.  Hypomagnesemia: Was replaced.  Will recheck tomorrow morning.  Bipolar disorder: Currently not on medications.   DVT prophylaxis: enoxaparin  (LOVENOX ) injection 40 mg Start: 06/14/24 2200 SCDs Start: 06/14/24 0424   Code Status: Full code Family Communication: None at the bedside Disposition Plan: Status is: Inpatient Remains inpatient appropriate because: Severe abdominal pain, IV antibiotics     Consultants:  Gastroenterology  Procedures:  None  Antimicrobials:  Zosyn  7/30---     Objective: Vitals:   06/15/24 0100 06/15/24 0337 06/15/24 0500 06/15/24 0720  BP:  125/77  (!) 122/100  Pulse:  (!) 137  (!) 134  Resp:  13  17  Temp: 99.9 F (37.7 C) 99.9 F (37.7 C)    TempSrc: Axillary Axillary    SpO2:  95%  94%  Weight:   109.5 kg   Height:   5' 5 (1.651 m)     Intake/Output Summary (Last 24 hours) at 06/15/2024 1056 Last data filed at 06/15/2024 0928 Gross per 24 hour  Intake 2202.04 ml  Output --  Net 2202.04 ml   Filed Weights   06/13/24 1817 06/15/24 0500  Weight: 108.9 kg 109.5  kg    Examination:  General exam: Sick looking.  Alert awake and interactive. Respiratory system: Clear to auscultation.  Poor inspiratory importer. Cardiovascular system: S1 & S2 heard, RRR. No JVD, murmurs, rubs, gallops or clicks. No pedal edema. Gastrointestinal system: Soft.  Diffusely tender mostly in the epigastrium and left lateral quadrant.  Bowel sounds present.  No rigidity or guarding. Central nervous system: Alert  and oriented. No focal neurological deficits.  Anxious.    Data Reviewed: I have personally reviewed following labs and imaging studies  CBC: Recent Labs  Lab 06/13/24 1934 06/14/24 0651 06/15/24 0424  WBC 20.6* 20.4* 37.0*  NEUTROABS  --  14.7*  --   HGB 12.4 11.0* 10.6*  HCT 41.0 36.4 34.6*  MCV 80.9 80.5 80.7  PLT 755* 685* 507*   Basic Metabolic Panel: Recent Labs  Lab 06/13/24 1934 06/14/24 0651  NA 138 133*  K 4.2 4.0  CL 102 103  CO2 26 18*  GLUCOSE 104* 97  BUN 12 13  CREATININE 0.79 0.82  CALCIUM 10.0 8.5*  MG  --  1.6*   GFR: Estimated Creatinine Clearance: 115.7 mL/min (by C-G formula based on SCr of 0.82 mg/dL). Liver Function Tests: Recent Labs  Lab 06/13/24 1934 06/14/24 0651  AST 17 15  ALT 21 20  ALKPHOS 97 93  BILITOT <0.2 0.5  PROT 7.4 6.4*  ALBUMIN 3.1* 2.7*   Recent Labs  Lab 06/13/24 1934 06/14/24 0651  LIPASE 320* 331*   No results for input(s): AMMONIA in the last 168 hours. Coagulation Profile: No results for input(s): INR, PROTIME in the last 168 hours. Cardiac Enzymes: No results for input(s): CKTOTAL, CKMB, CKMBINDEX, TROPONINI in the last 168 hours. BNP (last 3 results) No results for input(s): PROBNP in the last 8760 hours. HbA1C: No results for input(s): HGBA1C in the last 72 hours. CBG: Recent Labs  Lab 06/15/24 0059 06/15/24 0330 06/15/24 0725  GLUCAP 96 134* 126*   Lipid Profile: No results for input(s): CHOL, HDL, LDLCALC, TRIG, CHOLHDL, LDLDIRECT in the last 72 hours. Thyroid Function Tests: No results for input(s): TSH, T4TOTAL, FREET4, T3FREE, THYROIDAB in the last 72 hours. Anemia Panel: No results for input(s): VITAMINB12, FOLATE, FERRITIN, TIBC, IRON , RETICCTPCT in the last 72 hours. Sepsis Labs: Recent Labs  Lab 06/13/24 1939  LATICACIDVEN 1.5    Recent Results (from the past 240 hours)  Blood culture (routine x 2)     Status: None  (Preliminary result)   Collection Time: 06/13/24 11:04 PM   Specimen: BLOOD  Result Value Ref Range Status   Specimen Description BLOOD LEFT ANTECUBITAL  Final   Special Requests   Final    BOTTLES DRAWN AEROBIC AND ANAEROBIC Blood Culture adequate volume   Culture   Final    NO GROWTH < 12 HOURS Performed at [facility], [address], [place], [place] [address]    Report Status PENDING  Incomplete  Blood culture (routine x 2)     Status: None (Preliminary result)   Collection Time: 06/13/24 11:40 PM   Specimen: BLOOD RIGHT HAND  Result Value Ref Range Status   Specimen Description BLOOD RIGHT HAND  Final   Special Requests   Final    BOTTLES DRAWN AEROBIC AND ANAEROBIC Blood Culture adequate volume   Culture   Final    NO GROWTH < 12 HOURS Performed at [facility], [address], [place], [place] [address]    Report Status PENDING  Incomplete  Radiology Studies: CT CHEST ABDOMEN PELVIS W CONTRAST Result Date: 06/13/2024 CLINICAL DATA:  abdominal pain, post-surgery from previous pancreatitis EXAM: CT CHEST, ABDOMEN, AND PELVIS WITH CONTRAST TECHNIQUE: Multidetector CT imaging of the chest, abdomen and pelvis was performed following the standard protocol during bolus administration of intravenous contrast. RADIATION DOSE REDUCTION: This exam was performed according to the departmental dose-optimization program which includes automated exposure control, adjustment of the mA and/or kV according to patient size and/or use of iterative reconstruction technique. CONTRAST:  OMNIPAQUE  IOHEXOL  350 MG/ML SOLN COMPARISON:  06/01/2024 FINDINGS: CT CHEST FINDINGS Cardiovascular: Heart is normal size. Aorta is normal caliber. Mediastinum/Nodes: No mediastinal, hilar, or axillary adenopathy. Trachea and esophagus are unremarkable. Thyroid unremarkable. Lungs/Pleura: Small left pleural effusion. Airspace opacity in the left lower lobe and lingula could reflect  atelectasis or pneumonia. No confluent opacity or effusion on the right. Musculoskeletal: Chest wall soft tissues are unremarkable. No acute bony abnormality. CT ABDOMEN PELVIS FINDINGS Hepatobiliary: No focal liver abnormality is seen. Status post cholecystectomy. No biliary dilatation. Pancreas: Residual stranding around the tail of the pancreas is again noted and unchanged. Cystic area in the tail of pancreas 16 x 11 mm, stable. Spleen: No focal abnormality.  Normal size. Adrenals/Urinary Tract: No adrenal abnormality. No focal renal abnormality. No stones or hydronephrosis. Urinary bladder is unremarkable. Stomach/Bowel: Stomach, large and small bowel grossly unremarkable. Vascular/Lymphatic: Aortic atherosclerosis. No evidence of aneurysm or adenopathy. Reproductive: Uterus and adnexa unremarkable. No mass. IUD in the uterus. Other: Left upper quadrant drainage catheter noted terminating in the stomach. Decreasing size of the fluid collection under the left hemidiaphragm now measuring 4.9 x 2.9 cm compared to 8 x 5.8 cm previously. Fluid collection lateral in the left upper quadrant now measures 5.1 x 2.1 cm, larger than prior study when this was not measurable. Fluid collection laterally lower in the left abdomen measures 7.1 x 2.2 cm compared to 7.3 x 1.8 cm, not significantly changed. 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 Moderate degenerative disc and facet disease at L4-5 and L5-S1. IMPRESSION: Stranding around the pancreatic tail again noted, unchanged. Subdiaphragmatic left upper quadrant fluid collection has decreased in size. There is a drainage catheter in the region terminating in the stomach. Left upper quadrant lateral fluid collection measures 5.1 x 2.1 cm, slightly larger than prior study. More inferiorly in the left abdomen laterally, 7.1 x 2.2 cm fluid collection not significantly changed since prior  study. Small left pleural effusion. Compressive atelectasis or pneumonia in the left lower lobe and lingula. Electronically Signed   By: Franky Crease M.D.   On: 06/13/2024 21:15        Scheduled Meds:  enoxaparin  (LOVENOX ) injection  40 mg Subcutaneous Q24H   feeding supplement  1 Container Oral TID BM   ketorolac   15 mg Intravenous Q12H   ondansetron  (ZOFRAN ) IV  4 mg Intravenous Q6H   pantoprazole  (PROTONIX ) IV  40 mg Intravenous Q24H   sodium chloride  flush  3 mL Intravenous Q12H   Continuous Infusions:  sodium chloride      piperacillin -tazobactam (ZOSYN )  IV 3.375 g (06/15/24 0516)   promethazine  (PHENERGAN ) injection (IM or IVPB) Stopped (06/14/24 1228)     LOS: 1 day    Time spent: 55 minutes    Renato Applebaum, MD [facility]

## 2024-06-16 ENCOUNTER — Inpatient Hospital Stay (HOSPITAL_COMMUNITY): Payer: MEDICAID

## 2024-06-16 DIAGNOSIS — K8502 Idiopathic acute pancreatitis with infected necrosis: Secondary | ICD-10-CM | POA: Diagnosis not present

## 2024-06-16 DIAGNOSIS — D62 Acute posthemorrhagic anemia: Secondary | ICD-10-CM

## 2024-06-16 DIAGNOSIS — R6510 Systemic inflammatory response syndrome (SIRS) of non-infectious origin without acute organ dysfunction: Secondary | ICD-10-CM

## 2024-06-16 LAB — BASIC METABOLIC PANEL WITH GFR
Anion gap: 11 (ref 5–15)
BUN: 15 mg/dL (ref 6–20)
CO2: 19 mmol/L — ABNORMAL LOW (ref 22–32)
Calcium: 7.9 mg/dL — ABNORMAL LOW (ref 8.9–10.3)
Chloride: 103 mmol/L (ref 98–111)
Creatinine, Ser: 1.22 mg/dL — ABNORMAL HIGH (ref 0.44–1.00)
GFR, Estimated: 59 mL/min — ABNORMAL LOW (ref >60)
Glucose, Bld: 99 mg/dL (ref 70–99)
Potassium: 4.1 mmol/L (ref 3.5–5.1)
Sodium: 133 mmol/L — ABNORMAL LOW (ref 135–145)

## 2024-06-16 LAB — CBC WITH DIFFERENTIAL/PLATELET
Abs Immature Granulocytes: 1.04 K/uL — ABNORMAL HIGH (ref 0.00–0.07)
Basophils Absolute: 0.1 K/uL (ref 0.0–0.1)
Basophils Relative: 0 %
Eosinophils Absolute: 0 K/uL (ref 0.0–0.5)
Eosinophils Relative: 0 %
HCT: 27.3 % — ABNORMAL LOW (ref 36.0–46.0)
Hemoglobin: 8.4 g/dL — ABNORMAL LOW (ref 12.0–15.0)
Immature Granulocytes: 3 %
Lymphocytes Relative: 7 %
Lymphs Abs: 2.6 K/uL (ref 0.7–4.0)
MCH: 24.8 pg — ABNORMAL LOW (ref 26.0–34.0)
MCHC: 30.8 g/dL (ref 30.0–36.0)
MCV: 80.5 fL (ref 80.0–100.0)
Monocytes Absolute: 2.4 K/uL — ABNORMAL HIGH (ref 0.1–1.0)
Monocytes Relative: 6 %
Neutro Abs: 33.8 K/uL — ABNORMAL HIGH (ref 1.7–7.7)
Neutrophils Relative %: 84 %
Platelets: 496 K/uL — ABNORMAL HIGH (ref 150–400)
RBC: 3.39 MIL/uL — ABNORMAL LOW (ref 3.87–5.11)
RDW: 16.1 % — ABNORMAL HIGH (ref 11.5–15.5)
Smear Review: NORMAL
WBC: 40 K/uL — ABNORMAL HIGH (ref 4.0–10.5)
nRBC: 0 % (ref 0.0–0.2)

## 2024-06-16 LAB — MAGNESIUM: Magnesium: 1.7 mg/dL (ref 1.7–2.4)

## 2024-06-16 LAB — C-REACTIVE PROTEIN: CRP: 44.2 mg/dL — ABNORMAL HIGH (ref <1.0)

## 2024-06-16 LAB — SEDIMENTATION RATE: Sed Rate: 82 mm/h — ABNORMAL HIGH (ref 0–22)

## 2024-06-16 MED ORDER — SODIUM CHLORIDE 0.9 % IV SOLN: INTRAVENOUS | Status: DC

## 2024-06-16 MED ORDER — ENSURE PLUS HIGH PROTEIN PO LIQD
237.0000 mL | Freq: Three times a day (TID) | ORAL | Status: DC
  Administered 2024-06-16 – 2024-06-18 (×4): 237 mL via ORAL

## 2024-06-16 MED ORDER — IOHEXOL 350 MG/ML SOLN
100.0000 mL | Freq: Once | INTRAVENOUS | Status: AC | PRN
  Administered 2024-06-16: 100 mL via INTRAVENOUS

## 2024-06-16 MED ORDER — OCTREOTIDE ACETATE 50 MCG/ML IJ SOLN
100.0000 ug | Freq: Two times a day (BID) | INTRAMUSCULAR | Status: DC
  Administered 2024-06-16 – 2024-06-18 (×5): 100 ug via SUBCUTANEOUS
  Filled 2024-06-16 (×5): qty 2

## 2024-06-16 NOTE — Progress Notes (Signed)
 PROGRESS NOTE    Katrina Lambert  FMW:[id number] DOB: 1986/09/04 DOA: 06/13/2024 PCP: [facility]    Brief Narrative:  Severe complicated pancreatitis started 03/2024.  Pseudocyst, infection, most recent hospitalization at [facility] treated with cystogastrostomy.  Recent hospitalization with complicated pleural effusion treated with chest tube.  Discharged from [facility] from 7/23.  Had some relief for few days but again recurred so came back to hospital 7/30.  Came back with severe pain and low-grade fever.     In the ED her Tmax was 99.1, she had a white count of 20,600, CT of the abdomen and pelvis revealed decreasing size of the fluid collection under the left hemidiaphragm now measuring 4.9 x 2.9 cm compared with 8 x 5.8 cm previously.  Unfortunately the fluid collection lateral to the left upper quadrant now measures 5.1 x 2.1 cm which is larger than the prior study when this was not measurable on previous exam.  There was also a fluid collection laterally lower in the left abdomen that is unchanged in size.  Her lipase was 320.  Because of the leukocytosis she was started on Rocephin  2 g IV.  She was given IV Dilaudid  for pain and Zofran  for nausea.  Admitted along with GI consultation.      Subjective: Patient seen and examined.  Denies any nausea or vomiting.  Pain is fairly controlled last 24 hours.  She is tolerating liquids. Patient has visible swelling of the left flank today with mild tenderness.   Assessment & Plan:   Severe complicated and recurrent pancreatitis Pseudocyst, complicated and infected.  Now infiltrating into soft tissue.  N.p.o., IV fluids, adequate pain medication with IV and oral opiates.  IV Zosyn  for empiric treatment.  Blood cultures negative so far. Continue monitoring electrolytes and white Count. Repeat CT scan today.  Possibly will need percutaneous or endoscopic drainage to reduce burden of infection. Incentive spirometry, deep breathing exercises  mobility to continue.  Mobilize in the hallway.  Essential hypertension: Holding lisinopril  with risk of dehydration.  Blood pressures adequate.  Hypomagnesemia: Was replaced.  Adequate.  Bipolar disorder: Currently not on medications.   DVT prophylaxis: SCDs Start: 06/14/24 0424   Code Status: Full code Family Communication: None at the bedside Disposition Plan: Status is: Inpatient Remains inpatient appropriate because: Severe abdominal pain, IV antibiotics.     Consultants:  Gastroenterology  Procedures:  None  Antimicrobials:  Zosyn  7/30---     Objective: Vitals:   06/15/24 2324 06/15/24 2342 06/16/24 0354 06/16/24 0739  BP:  109/83 114/70 119/71  Pulse:  (!) 122 (!) 138 (!) 117  Resp:   19 18  Temp: 99.5 F (37.5 C)  98.6 F (37 C) 97.9 F (36.6 C)  TempSrc: Oral  Oral Oral  SpO2:  96% 96% 100%  Weight:      Height:        Intake/Output Summary (Last 24 hours) at 06/16/2024 1103 Last data filed at 06/16/2024 0800 Gross per 24 hour  Intake 2408.18 ml  Output --  Net 2408.18 ml   Filed Weights   06/13/24 1817 06/15/24 0500  Weight: 108.9 kg 109.5 kg    Examination:  General exam: In mild distress due to pain.  Walking in the room.  Anxious. Respiratory system: Clear to auscultation.  Poor inspiratory importer. Cardiovascular system: S1 & S2 heard, RRR. No JVD, murmurs, rubs, gallops or clicks. No pedal edema. Gastrointestinal system: Soft.  Diffusely tender on deep palpation.  Palpable subcutaneous swelling  left flank with no areas of redness or erythema.  Bowel sounds present. Central nervous system: Alert and oriented. No focal neurological deficits.  Anxious.    Data Reviewed: I have personally reviewed following labs and imaging studies  CBC: Recent Labs  Lab 06/13/24 1934 06/14/24 0651 06/15/24 0424 06/16/24 0433  WBC 20.6* 20.4* 37.0* 40.0*  NEUTROABS  --  14.7*  --  33.8*  HGB 12.4 11.0* 10.6* 8.4*  HCT 41.0 36.4 34.6* 27.3*  MCV  80.9 80.5 80.7 80.5  PLT 755* 685* 507* 496*   Basic Metabolic Panel: Recent Labs  Lab 06/13/24 1934 06/14/24 0651 06/16/24 0433  NA 138 133* 133*  K 4.2 4.0 4.1  CL 102 103 103  CO2 26 18* 19*  GLUCOSE 104* 97 99  BUN 12 13 15   CREATININE 0.79 0.82 1.22*  CALCIUM 10.0 8.5* 7.9*  MG  --  1.6* 1.7   GFR: Estimated Creatinine Clearance: 77.7 mL/min (A) (by C-G formula based on SCr of 1.22 mg/dL (H)). Liver Function Tests: Recent Labs  Lab 06/13/24 1934 06/14/24 0651  AST 17 15  ALT 21 20  ALKPHOS 97 93  BILITOT <0.2 0.5  PROT 7.4 6.4*  ALBUMIN 3.1* 2.7*   Recent Labs  Lab 06/13/24 1934 06/14/24 0651  LIPASE 320* 331*   No results for input(s): AMMONIA in the last 168 hours. Coagulation Profile: No results for input(s): INR, PROTIME in the last 168 hours. Cardiac Enzymes: No results for input(s): CKTOTAL, CKMB, CKMBINDEX, TROPONINI in the last 168 hours. BNP (last 3 results) No results for input(s): PROBNP in the last 8760 hours. HbA1C: No results for input(s): HGBA1C in the last 72 hours. CBG: Recent Labs  Lab 06/15/24 0059 06/15/24 0330 06/15/24 0725 06/15/24 1117  GLUCAP 96 134* 126* 118*   Lipid Profile: No results for input(s): CHOL, HDL, LDLCALC, TRIG, CHOLHDL, LDLDIRECT in the last 72 hours. Thyroid Function Tests: No results for input(s): TSH, T4TOTAL, FREET4, T3FREE, THYROIDAB in the last 72 hours. Anemia Panel: No results for input(s): VITAMINB12, FOLATE, FERRITIN, TIBC, IRON , RETICCTPCT in the last 72 hours. Sepsis Labs: Recent Labs  Lab 06/13/24 1939  LATICACIDVEN 1.5    Recent Results (from the past 240 hours)  Blood culture (routine x 2)     Status: None (Preliminary result)   Collection Time: 06/13/24 11:04 PM   Specimen: BLOOD  Result Value Ref Range Status   Specimen Description BLOOD LEFT ANTECUBITAL  Final   Special Requests   Final    BOTTLES DRAWN AEROBIC AND ANAEROBIC  Blood Culture adequate volume   Culture   Final    NO GROWTH 2 DAYS Performed at [facility], [address], [place], [place] [address]    Report Status PENDING  Incomplete  Blood culture (routine x 2)     Status: None (Preliminary result)   Collection Time: 06/13/24 11:40 PM   Specimen: BLOOD RIGHT HAND  Result Value Ref Range Status   Specimen Description BLOOD RIGHT HAND  Final   Special Requests   Final    BOTTLES DRAWN AEROBIC AND ANAEROBIC Blood Culture adequate volume   Culture   Final    NO GROWTH 2 DAYS Performed at [facility], [address], [place], [place] [address]    Report Status PENDING  Incomplete         Radiology Studies: CT ABDOMEN PELVIS W CONTRAST Result Date: 06/16/2024 CLINICAL DATA:  Pancreatitis. EXAM: CT ABDOMEN AND PELVIS WITH CONTRAST  TECHNIQUE: Multidetector CT imaging of the abdomen and pelvis was performed using the standard protocol following bolus administration of intravenous contrast. RADIATION DOSE REDUCTION: This exam was performed according to the departmental dose-optimization program which includes automated exposure control, adjustment of the mA and/or kV according to patient size and/or use of iterative reconstruction technique. CONTRAST:  OMNIPAQUE  IOHEXOL  350 MG/ML SOLN COMPARISON:  June 13, 2024. FINDINGS: Lower chest: Left pleural effusion is noted with associated atelectasis or infiltrate of left lower lobe. Hepatobiliary: No focal liver abnormality is seen. Status post cholecystectomy. No biliary dilatation. Pancreas: Stable 11 x 8 mm cystic areas seen in pancreatic tail. Minimal inflammatory changes are noted around the pancreatic tail. Spleen: Normal in size without focal abnormality. Adrenals/Urinary Tract: Adrenal glands are unremarkable. Kidneys are normal, without renal calculi, focal lesion, or hydronephrosis. Bladder is unremarkable. Stomach/Bowel: There is again noted internal drainage catheter extending from  loculated pancreatic pseudocyst in left upper quadrant into proximal gastric lumen. The fluid collection measures 11.7 by 2.2 cm and demonstrates enhancing margins. It extends into left subdiaphragmatic space. 5.3 x 3.2 cm fluid collection is noted just underneath the left hemidiaphragm which is not significantly changed compared to prior exam. The drainage catheter does not appear to extend into the remaining fluid collections. There is no evidence of large or small bowel dilatation or inflammation. The appendix is unremarkable. Vascular/Lymphatic: Aortic atherosclerosis. No enlarged abdominal or pelvic lymph nodes. Reproductive: Intrauterine device is noted. No adnexal abnormality is noted. Other: No definite hernia is noted. 5.6 x 2.0 cm fluid collection is noted laterally in left upper quadrant which is decreased in size compared to prior exam. This most likely is related to pancreatic pseudocyst. 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 Severe degenerative disc disease is noted at this level as well. No acute osseous abnormality is noted. IMPRESSION: Continued presence of multiloculated fluid collection in left upper quadrant most consistent with pancreatic pseudocyst. There is noted continued presence of drain extending from possible location of pseudo cyst into gastric lumen, but there does not appear to be any definite fluid collection around the drain currently. Minimal inflammatory changes are noted around the pancreatic tail. Stable cystic abnormality is noted in pancreatic tail as well, which may represent intraparenchymal pseudocyst. Left pleural effusion is noted with associated left basilar atelectasis or infiltrate. Aortic Atherosclerosis (ICD10-I70.0). Electronically Signed   By: Lynwood Landy Raddle M.D.   On: 06/16/2024 09:49        Scheduled Meds:  feeding supplement  1 Container Oral TID BM   ondansetron  (ZOFRAN ) IV  4 mg  Intravenous Q6H   pantoprazole  (PROTONIX ) IV  40 mg Intravenous Q24H   sodium chloride  flush  3 mL Intravenous Q12H   Continuous Infusions:  sodium chloride  125 mL/hr at 06/16/24 0901   piperacillin -tazobactam (ZOSYN )  IV 12.5 mL/hr at 06/16/24 0800   promethazine  (PHENERGAN ) injection (IM or IVPB) 150 mL/hr at 06/16/24 0800     LOS: 2 days    Time spent: 55 minutes    Renato Applebaum, MD [facility]

## 2024-06-16 NOTE — Progress Notes (Addendum)
 Patient ID: Katrina Lambert, female   DOB: 08-13-1986, 38 y.o.   MRN: [id number]    Progress Note   Subjective   Day # 3 CC; recurrent abdominal pain, nausea, fever inpatient with prolonged course secondary to complicated pancreatitis with large pseudocysts-status post EUS with cystogastrostomy 06/06/2024/[facility]  IV Zosyn   Labs today-WBC 40.0/hemoglobin 8.4/hematocrit 27.3 Sodium 133/potassium 4.1/BUN 15/creatinine 1.22  Patient sitting up in bed, says she did tolerate some clear liquids yesterday initially says she felt about the same but then showed me an area on her left flank soft tissues which has been progressively more painful.  This area is protuberant, indurated feeling but no obvious cellulitis and quite tender to touch-patient tearful     Objective   Vital signs in last 24 hours: Temp:  [97.9 F (36.6 C)-100.1 F (37.8 C)] 97.9 F (36.6 C) (08/01 0739) Pulse Rate:  [117-138] 117 (08/01 0739) Resp:  [18-19] 18 (08/01 0739) BP: (108-119)/(62-83) 119/71 (08/01 0739) SpO2:  [86 %-100 %] 100 % (08/01 0739) Last BM Date :  (PTA) General: Young   white female in NAD, uncomfortable appearing, sitting up in bed Heart: Tachycardic 120 regular rate and rhythm; no murmurs Lungs: Respirations even and unlabored, decreased breath sounds left base Abdomen:  Soft, obese, bowel sounds are present she is tender in the left upper quadrant left mid quadrant and into the left flank where there is a swollen area of soft tissue indurated feeling but no obvious cellulitis and quite tender to touch.  Extremities:  Without edema. Neurologic:  Alert and oriented,  grossly normal neurologically. Psych:  Cooperative. Normal mood and affect.  Intake/Output from previous day: 07/31 0701 - 08/01 0700 In: 372.3 [I.V.:372.3] Out: -  Intake/Output this shift: No intake/output data recorded.  Lab Results: Recent Labs    06/14/24 0651 06/15/24 0424 06/16/24 0433  WBC 20.4* 37.0* 40.0*   HGB 11.0* 10.6* 8.4*  HCT 36.4 34.6* 27.3*  PLT 685* 507* 496*   BMET Recent Labs    06/13/24 1934 06/14/24 0651 06/16/24 0433  NA 138 133* 133*  K 4.2 4.0 4.1  CL 102 103 103  CO2 26 18* 19*  GLUCOSE 104* 97 99  BUN 12 13 15   CREATININE 0.79 0.82 1.22*  CALCIUM 10.0 8.5* 7.9*   LFT Recent Labs    06/14/24 0651  PROT 6.4*  ALBUMIN 2.7*  AST 15  ALT 20  ALKPHOS 93  BILITOT 0.5   PT/INR No results for input(s): LABPROT, INR in the last 72 hours.     Assessment / Plan:    #28 38 year old female with long complicated course after initial episode of severe pancreatitis felt to be idiopathic May 2025 (status post prior cholecystectomy).  Recent prolonged hospitalization due to enlarging pancreatic pseudocysts.  She  had 2 EUS is here with cyst aspiration of the largest pseudocyst without evidence of abscess or infection but did have rapid reaccumulation of fluid suggestive of pancreatic duct disruption  [facility] was contacted for pancreatic ERCP/pancreatic duct stent placement, she was transferred there on 06/04/2024 and ultimately underwent EUS with cystogastrostomy on 06/06/2024, did not undergo pancreatic duct stent placement. Charge from [place] the following day did okay at home for a couple of days then developed more left upper quadrant left lateral abdominal pain into her left flank and a fever to 101.6 on the day of admission.  Met SIRS criteria Covering with IV Zosyn   Initial CT showed the previous dominant pseudocyst significantly decreased in size  however the left upper quadrant lateral fluid collection increased to 5.1 x 2.1 cm and the more inferior left abdominal fluid collection had not significantly changed. Atelectasis on CT but no definite pneumonia left lower lobe  Unfortunately she has had a progressive leukocytosis, remains tachycardic and is complaining of pain which has worsened into the left flank where it is swollen but not obviously  cellulitic.  High concern for pancreatic pseudocyst extension into the soft tissues, and concern for infected pseudocyst or pseudocysts  #2 anemia with drop in hemoglobin from 12.4-8.4 over the past 3 days-have hemorrhagic component to one of the pseudocysts.  Plan; stat CT abdomen pelvis with contrast this a.m. Continue Zosyn  Blood cultures no growth thus far, follow She has not been on subcu octreotide  this admission, will wait for CT then decide about reinitiating  Pending on results of CT she may need to be transferred back to [facility], versus necrosectomy via cystogastrostomy here per Dr. Wilhelmenia, realizing patient is at high risk for complications and bleeding.  Trend hemoglobin carefully and transfuse if needed Hold Lovenox  Continue IV fluids at 125 cc/h  Discussed plans with patient at bedside today, she is tearful and scared as she has had such a prolonged course but understands.   Addendum-repeat CT shows left pleural effusion with associated atelectasis or infiltrate, stable small cystic area in the pancreatic tail Internal drainage catheter extending from the loculated pancreatic pseudocyst in the left upper quadrant into the proximal gastric lumen this fluid collection measures 11.7 x 2.2 cm with enhancing margins, this is larger than on previous CT 2 days ago.  It extends into the left subdiaphragmatic space.  There is a second fluid collection 5.3 x 3.2 cm just under the left hemidiaphragm not significantly changed, and 5.6 x 2 cm fluid collection noted laterally in the left upper quadrant which is decreased in size.  Dr. Wilhelmenia has spoken with Dr. Spaete at [facility] and patient will tentatively be placed on the transfer list for Sunday as may need further necrosectomy. In the interim we will also consult IR to see if percutaneous drainage of the most lateral fluid collection down into the left flank is feasible.  Concern is for infected pseudocyst.  Patient updated, will keep  n.p.o. past midnight Lovenox  has been stopped         Principal Problem:   Acute pancreatitis Active Problems:   Abdominal fluid collection     LOS: 2 days   Korvin Valentine PA-C 06/16/2024, 8:22 AM

## 2024-06-16 NOTE — Plan of Care (Signed)
 Patient still having significant pain control issues.  Plan to go to [facility] for procedure Monday.  Vitals remain stable.

## 2024-06-16 NOTE — Progress Notes (Signed)
 Talked to [facility] transfer resource and notified that she will likely transfer Sunday 06/18/24 for procedure Monday unless there is a status change to please let them know.

## 2024-06-17 DIAGNOSIS — K8532 Drug induced acute pancreatitis with infected necrosis: Secondary | ICD-10-CM | POA: Diagnosis not present

## 2024-06-17 DIAGNOSIS — K859 Acute pancreatitis without necrosis or infection, unspecified: Principal | ICD-10-CM

## 2024-06-17 LAB — GLUCOSE, CAPILLARY: Glucose-Capillary: 87 mg/dL (ref 70–99)

## 2024-06-17 LAB — BASIC METABOLIC PANEL WITH GFR
Anion gap: 12 (ref 5–15)
BUN: 10 mg/dL (ref 6–20)
CO2: 18 mmol/L — ABNORMAL LOW (ref 22–32)
Calcium: 8 mg/dL — ABNORMAL LOW (ref 8.9–10.3)
Chloride: 106 mmol/L (ref 98–111)
Creatinine, Ser: 0.9 mg/dL (ref 0.44–1.00)
GFR, Estimated: >60 mL/min (ref >60)
Glucose, Bld: 112 mg/dL — ABNORMAL HIGH (ref 70–99)
Potassium: 4.4 mmol/L (ref 3.5–5.1)
Sodium: 136 mmol/L (ref 135–145)

## 2024-06-17 LAB — CBC WITH DIFFERENTIAL/PLATELET
Abs Immature Granulocytes: 0.48 K/uL — ABNORMAL HIGH (ref 0.00–0.07)
Basophils Absolute: 0.1 K/uL (ref 0.0–0.1)
Basophils Relative: 0 %
Eosinophils Absolute: 0.1 K/uL (ref 0.0–0.5)
Eosinophils Relative: 1 %
HCT: 28.2 % — ABNORMAL LOW (ref 36.0–46.0)
Hemoglobin: 8.6 g/dL — ABNORMAL LOW (ref 12.0–15.0)
Immature Granulocytes: 2 %
Lymphocytes Relative: 11 %
Lymphs Abs: 3.4 K/uL (ref 0.7–4.0)
MCH: 24.7 pg — ABNORMAL LOW (ref 26.0–34.0)
MCHC: 30.5 g/dL (ref 30.0–36.0)
MCV: 81 fL (ref 80.0–100.0)
Monocytes Absolute: 1.3 K/uL — ABNORMAL HIGH (ref 0.1–1.0)
Monocytes Relative: 4 %
Neutro Abs: 25 K/uL — ABNORMAL HIGH (ref 1.7–7.7)
Neutrophils Relative %: 82 %
Platelets: 510 K/uL — ABNORMAL HIGH (ref 150–400)
RBC: 3.48 MIL/uL — ABNORMAL LOW (ref 3.87–5.11)
RDW: 15.9 % — ABNORMAL HIGH (ref 11.5–15.5)
Smear Review: NORMAL
WBC: 30.3 K/uL — ABNORMAL HIGH (ref 4.0–10.5)
nRBC: 0 % (ref 0.0–0.2)

## 2024-06-17 MED ORDER — DIPHENHYDRAMINE HCL 25 MG PO CAPS: 25.0000 mg | ORAL_CAPSULE | Freq: Four times a day (QID) | ORAL | Status: AC | PRN

## 2024-06-17 MED ORDER — PIPERACILLIN-TAZOBACTAM 3.375 G IVPB
3.3750 g | Freq: Three times a day (TID) | INTRAVENOUS | Status: AC
Start: 2024-06-17 — End: ?

## 2024-06-17 MED ORDER — OCTREOTIDE ACETATE 50 MCG/ML IJ SOLN
100.0000 ug | Freq: Two times a day (BID) | INTRAMUSCULAR | Status: AC
Start: 2024-06-17 — End: ?

## 2024-06-17 MED ORDER — ENSURE PLUS HIGH PROTEIN PO LIQD: 237.0000 mL | Freq: Three times a day (TID) | ORAL | Status: AC

## 2024-06-17 MED ORDER — SODIUM CHLORIDE 0.9 % IV SOLN
125.0000 mL | INTRAVENOUS | 0 refills | Status: AC
Start: 2024-06-17 — End: ?

## 2024-06-17 NOTE — Discharge Summary (Signed)
 Physician Discharge Summary  Katrina Lambert FMW:[id number] DOB: [birth year] DOA: 06/13/2024  PCP: [facility]  Admit date: 06/13/2024 Discharge date: 06/18/2024  Admitted From: Home Disposition: [facility]   Discharge Condition: Stable CODE STATUS: Full code Diet recommendation: Full liquid diet  Discharge summary: Discharge summary is created as anticipated transfer to [facility].  This may change by the time patient is discharged and if not discharged today.   Severe complicated pancreatitis started 03/2024.  Pseudocyst, infection, most recent hospitalization at [facility] treated with cystogastrostomy.  Recent hospitalization with complicated pleural effusion treated with chest tube.  Discharged from [facility] from 7/23.  Had some relief for few days but again recurred so came back to hospital 7/30.  Came back with severe pain and low-grade fever.    In the ED her Tmax was 99.1, she had a white count of 20,600, CT of the abdomen and pelvis revealed decreasing size of the fluid collection under the left hemidiaphragm now measuring 4.9 x 2.9 cm compared with 8 x 5.8 cm previously.  Unfortunately the fluid collection lateral to the left upper quadrant now measures 5.1 x 2.1 cm which is larger than the prior study when this was not measurable on previous exam.  There was also a fluid collection laterally lower in the left abdomen that is unchanged in size.  Her lipase was 320.  Because of the leukocytosis she was started on Rocephin  2 g IV.  She was given IV Dilaudid  for pain and Zofran  for nausea.  Admitted along with GI consultation. Patient continues to have pain, elevated WBC count.  She has been scheduled to be transferred to [facility] for advanced endoscopy procedure.  Severe complicated and recurrent pancreatitis Pseudocyst, complicated and infected.  Now infiltrating into soft tissue.  Clear liquid diet.  IV fluids.  Adequate IV and oral pain medications.   Continue IV Zosyn  for empiric treatment.  Blood cultures negative so far. Repeat CT scan 8/1 with increasing size of abdominal fluid collection approximating the left abdominal wall/flank.  Patient is currently clinically stabilized but she will need advance endoscopy procedure.  She will be transferred to [facility]. Seen by IR, did not recommend percutaneous drainage as she is going to have endoscopic procedure.    Essential hypertension: Holding lisinopril  with risk of dehydration.  Blood pressures adequate.   Hypomagnesemia: Was replaced.  Adequate.   Bipolar disorder: Currently not on medications.  Patient is medically stable to transfer to tertiary care center for advance care.  Addendum: See progress note 8/3.  Clinical plan remains same to transfer patient to [facility].   Discharge Diagnoses:  Principal Problem:   Acute pancreatitis Active Problems:   Abdominal fluid collection   SIRS (systemic inflammatory response syndrome) [facility])    Discharge Instructions  Discharge Instructions     Increase activity slowly   Complete by: As directed    No wound care   Complete by: As directed       Allergies as of 06/18/2024   No Known Allergies      Medication List     STOP taking these medications    amLODipine  10 MG tablet Commonly known as: NORVASC    cefTRIAXone  2 g in sodium chloride  0.9 % 100 mL   ciprofloxacin  500 MG tablet Commonly known as: CIPRO    hydrochlorothiazide  12.5 MG capsule Commonly known as: MICROZIDE    lisinopril  10 MG tablet Commonly known as: ZESTRIL    metFORMIN  500 MG tablet Commonly  known as: GLUCOPHAGE    nystatin  powder Commonly known as: MYCOSTATIN /NYSTOP    potassium chloride  SA 20 MEQ tablet Commonly known as: KLOR-CON  M   rosuvastatin 5 MG tablet Commonly known as: CRESTOR       TAKE these medications    acetaminophen  500 MG tablet Commonly known as: TYLENOL  Take 500 mg by mouth every 6 (six) hours as needed for  mild pain (pain score 1-3).   diphenhydrAMINE  25 mg capsule Commonly known as: BENADRYL  Take 1 capsule (25 mg total) by mouth every 6 (six) hours as needed for itching or allergies.   feeding supplement Liqd Take 237 mLs by mouth 3 (three) times daily between meals.   nicotine  14 mg/24hr patch Commonly known as: NICODERM CQ  - dosed in mg/24 hours Place 1 patch (14 mg total) onto the skin daily. Start taking on: June 19, 2024   octreotide  50 MCG/ML Soln injection Commonly known as: SANDOSTATIN  Inject 2 mLs (100 mcg total) into the skin every 12 (twelve) hours.   oxyCODONE  5 MG immediate release tablet Commonly known as: Oxy IR/ROXICODONE  Take 5 mg by mouth every 4 (four) hours as needed for severe pain (pain score 7-10).   piperacillin -tazobactam 3.375 GM/50ML IVPB Commonly known as: ZOSYN  Inject 50 mLs (3.375 g total) into the vein every 8 (eight) hours.   sodium chloride  0.9 % infusion Inject 125 mLs into the vein continuous.        Follow-up Information     [facility] transportation services Follow up.   Why: Call 2-3 days in advance to schedule transportation for appointments Contact information: [employer]:[phone]               No Known Allergies  Consultations: Gastroenterology   Procedures/Studies: DG CHEST PORT 1 VIEW Result Date: 06/18/2024 EXAM: 1 VIEW XRAY OF THE CHEST 06/18/2024 03:05:36 AM COMPARISON: Radiograph 04/21/2024 and CT Jun 13 2024 CLINICAL HISTORY: 141880 SOB (shortness of breath) [id number]. Reason for exam: shortness of breath FINDINGS: LUNGS AND PLEURA: Increased size of the now moderate left pleural effusion. Associated atelectasis or pneumonia in the left lower lobe. Patchy opacities in the right lung may be due to atelectasis or pneumonia. HEART AND MEDIASTINUM: Cardiomegaly. Pulmonary vascular congestion. IMPRESSION: 1. Moderate left pleural effusion with associated left lower lobe atelectasis or pneumonia. 2. Patchy opacities in  the right lung, possibly due to atelectasis or pneumonia. 3. Cardiomegaly and pulmonary vascular congestion. Electronically signed by: Norman Gatlin MD 06/18/2024 03:11 AM EDT RP Workstation: [id number]   CT ABDOMEN PELVIS W CONTRAST Result Date: 06/16/2024 CLINICAL DATA:  Pancreatitis. EXAM: CT ABDOMEN AND PELVIS WITH CONTRAST TECHNIQUE: Multidetector CT imaging of the abdomen and pelvis was performed using the standard protocol following bolus administration of intravenous contrast. RADIATION DOSE REDUCTION: This exam was performed according to the departmental dose-optimization program which includes automated exposure control, adjustment of the mA and/or kV according to patient size and/or use of iterative reconstruction technique. CONTRAST:  OMNIPAQUE  IOHEXOL  350 MG/ML SOLN COMPARISON:  June 13, 2024. FINDINGS: Lower chest: Left pleural effusion is noted with associated atelectasis or infiltrate of left lower lobe. Hepatobiliary: No focal liver abnormality is seen. Status post cholecystectomy. No biliary dilatation. Pancreas: Stable 11 x 8 mm cystic areas seen in pancreatic tail. Minimal inflammatory changes are noted around the pancreatic tail. Spleen: Normal in size without focal abnormality. Adrenals/Urinary Tract: Adrenal glands are unremarkable. Kidneys are normal, without renal calculi, focal lesion, or hydronephrosis. Bladder is unremarkable. Stomach/Bowel: There is again noted internal drainage catheter  extending from loculated pancreatic pseudocyst in left upper quadrant into proximal gastric lumen. The fluid collection measures 11.7 by 2.2 cm and demonstrates enhancing margins. It extends into left subdiaphragmatic space. 5.3 x 3.2 cm fluid collection is noted just underneath the left hemidiaphragm which is not significantly changed compared to prior exam. The drainage catheter does not appear to extend into the remaining fluid collections. There is no evidence of large or small bowel  dilatation or inflammation. The appendix is unremarkable. Vascular/Lymphatic: Aortic atherosclerosis. No enlarged abdominal or pelvic lymph nodes. Reproductive: Intrauterine device is noted. No adnexal abnormality is noted. Other: No definite hernia is noted. 5.6 x 2.0 cm fluid collection is noted laterally in left upper quadrant which is decreased in size compared to prior exam. This most likely is related to pancreatic pseudocyst. 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 Severe degenerative disc disease is noted at this level as well. No acute osseous abnormality is noted. IMPRESSION: Continued presence of multiloculated fluid collection in left upper quadrant most consistent with pancreatic pseudocyst. There is noted continued presence of drain extending from possible location of pseudo cyst into gastric lumen, but there does not appear to be any definite fluid collection around the drain currently. Minimal inflammatory changes are noted around the pancreatic tail. Stable cystic abnormality is noted in pancreatic tail as well, which may represent intraparenchymal pseudocyst. Left pleural effusion is noted with associated left basilar atelectasis or infiltrate. Aortic Atherosclerosis (ICD10-I70.0). Electronically Signed   By: Lynwood Landy Raddle M.D.   On: 06/16/2024 09:49   CT CHEST ABDOMEN PELVIS W CONTRAST Result Date: 06/13/2024 CLINICAL DATA:  abdominal pain, post-surgery from previous pancreatitis EXAM: CT CHEST, ABDOMEN, AND PELVIS WITH CONTRAST TECHNIQUE: Multidetector CT imaging of the chest, abdomen and pelvis was performed following the standard protocol during bolus administration of intravenous contrast. RADIATION DOSE REDUCTION: This exam was performed according to the departmental dose-optimization program which includes automated exposure control, adjustment of the mA and/or kV according to patient size and/or use of iterative  reconstruction technique. CONTRAST:  OMNIPAQUE  IOHEXOL  350 MG/ML SOLN COMPARISON:  06/01/2024 FINDINGS: CT CHEST FINDINGS Cardiovascular: Heart is normal size. Aorta is normal caliber. Mediastinum/Nodes: No mediastinal, hilar, or axillary adenopathy. Trachea and esophagus are unremarkable. Thyroid unremarkable. Lungs/Pleura: Small left pleural effusion. Airspace opacity in the left lower lobe and lingula could reflect atelectasis or pneumonia. No confluent opacity or effusion on the right. Musculoskeletal: Chest wall soft tissues are unremarkable. No acute bony abnormality. CT ABDOMEN PELVIS FINDINGS Hepatobiliary: No focal liver abnormality is seen. Status post cholecystectomy. No biliary dilatation. Pancreas: Residual stranding around the tail of the pancreas is again noted and unchanged. Cystic area in the tail of pancreas 16 x 11 mm, stable. Spleen: No focal abnormality.  Normal size. Adrenals/Urinary Tract: No adrenal abnormality. No focal renal abnormality. No stones or hydronephrosis. Urinary bladder is unremarkable. Stomach/Bowel: Stomach, large and small bowel grossly unremarkable. Vascular/Lymphatic: Aortic atherosclerosis. No evidence of aneurysm or adenopathy. Reproductive: Uterus and adnexa unremarkable. No mass. IUD in the uterus. Other: Left upper quadrant drainage catheter noted terminating in the stomach. Decreasing size of the fluid collection under the left hemidiaphragm now measuring 4.9 x 2.9 cm compared to 8 x 5.8 cm previously. Fluid collection lateral in the left upper quadrant now measures 5.1 x 2.1 cm, larger than prior study when this was not measurable. Fluid collection laterally lower in the left abdomen measures 7.1 x 2.2 cm compared to  7.3 x 1.8 cm, not significantly changed. 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 Moderate degenerative disc and facet disease at L4-5 and L5-S1.  IMPRESSION: Stranding around the pancreatic tail again noted, unchanged. Subdiaphragmatic left upper quadrant fluid collection has decreased in size. There is a drainage catheter in the region terminating in the stomach. Left upper quadrant lateral fluid collection measures 5.1 x 2.1 cm, slightly larger than prior study. More inferiorly in the left abdomen laterally, 7.1 x 2.2 cm fluid collection not significantly changed since prior study. Small left pleural effusion. Compressive atelectasis or pneumonia in the left lower lobe and lingula. Electronically Signed   By: Franky Crease M.D.   On: 06/13/2024 21:15   CT ABDOMEN W CONTRAST Result Date: 06/01/2024 CLINICAL DATA:  History of pancreatitis pseudocyst EXAM: CT ABDOMEN WITH CONTRAST TECHNIQUE: Multidetector CT imaging of the abdomen was performed using the standard protocol following bolus administration of intravenous contrast. RADIATION DOSE REDUCTION: This exam was performed according to the departmental dose-optimization program which includes automated exposure control, adjustment of the mA and/or kV according to patient size and/or use of iterative reconstruction technique. CONTRAST:  75mL OMNIPAQUE  IOHEXOL  350 MG/ML SOLN COMPARISON:  CT 05/30/2024, 05/28/2024, 05/21/2024, 04/15/2024 FINDINGS: Lower chest: Lung bases demonstrate small left-sided pleural effusion with dense left lower lobe consolidation, similar to slightly diminished compared to the CT 2 days ago. Hepatobiliary: Hepatic steatosis. Cholecystectomy. No biliary dilatation Pancreas: Mild residual stranding about the tail of the pancreas. Small cystic collection at the tail of the pancreas measuring 16 x 11 mm, previously 22 x 16 mm. Spleen: Normal in size without focal abnormality. Adrenals/Urinary Tract: Adrenal glands are normal. Kidneys show no hydronephrosis. Stomach/Bowel: Stomach slightly deviated to the patient's right by multiple rim enhancing fluid collection. Bowel is otherwise  unremarkable Vascular/Lymphatic: Aortic atherosclerosis. No aneurysm. 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 Other: No free air. Multiple rim enhancing cystic collections with inflammatory stranding in the left upper quadrant, fluid collections are visible between the left diaphragm and spleen, adjacent to the gastric fundus and coursing toward still of pancreas. Slight increased size of the collection between the diaphragm and the spleen, this collection measures approximately 8 x 5.8 cm on series 3 image 8 and 9.5 by 4.3 cm on coronal series 6, image 44. On the most recent prior, collection measured about 54 x 23 mm on axial images, by 8.6 cm in maximum length. Left subdiaphragmatic collection is contiguous with rim enhancing collection that extends anterior to the gastric fundal region, this measures approximately 5.7 x 2.8 cm on series 3, image 21, previously about 4.4 by 0.9 cm. Collection also extends posterior to the spleen. Multiple additional thin crescent shaped collections within the peripheral left upper and mid quadrant, lateral to the colon, collection measures about 22 mm maximum thickness on coronal series 6, image 68 and extends from just below the diaphragm to the level of the mid abdomen. This also appears slightly increased compared with the most recent prior exam Musculoskeletal: No acute osseous abnormality. 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 IMPRESSION: 1. Multiple rim enhancing fluid collections with inflammatory stranding in the left upper quadrant, suspect for pseudocysts related to pancreatitis (sterility indeterminate on this CT). Dominant fluid collection between the left diaphragm and spleen is contiguous with fluid that tracks posterior to the spleen and is also contiguous with rim  enhancing collection anterior to the gastric fundal region; these collections are increased compared to the most  recent prior study from 07/15. Multiple additional thin crescent shaped collections within the peripheral left upper and mid quadrant, lateral to the colon, extending from the level of the diaphragm to the mid abdomen, also suspect for developing pseudocysts and are increased compared to the prior exam. Slight interval decrease in small pseudocyst at the pancreatic tail. 2. Small left-sided pleural effusion with dense left lower lobe consolidation, similar to slightly diminished compared to the CT 2 days ago. 3. Hepatic steatosis. Aortic Atherosclerosis (ICD10-I70.0). Electronically Signed   By: Luke Bun M.D.   On: 06/01/2024 23:36   CT ABDOMEN W CONTRAST Result Date: 05/30/2024 CLINICAL DATA:  Left upper quadrant pain, history of pancreatic pseudocyst and recent endoscopic transgastric drainage. EXAM: CT ABDOMEN WITH CONTRAST TECHNIQUE: Multidetector CT imaging of the abdomen was performed using the standard protocol following bolus administration of intravenous contrast. RADIATION DOSE REDUCTION: This exam was performed according to the departmental dose-optimization program which includes automated exposure control, adjustment of the mA and/or kV according to patient size and/or use of iterative reconstruction technique. CONTRAST:  75mL OMNIPAQUE  IOHEXOL  350 MG/ML SOLN COMPARISON:  05/28/2024 FINDINGS: Lower chest: Right lung base is within normal limits. Left lung base shows small pleural effusion and left lower lobe consolidation similar to that seen on the prior exam. Hepatobiliary: No focal liver abnormality is seen. Status post cholecystectomy. No biliary dilatation. Pancreas: Pancreas is again well visualized. Small pseudocyst is noted within the tail of the pancreas. The previously seen extensive multiloculated fluid collection in the left upper quadrant has nearly completely resolved following cyst drainage VA transgastric approach. There remains some inflammatory change and collapsed cyst walls  adjacent to the stomach. A small curvilinear collection is noted posterior to the spleen beneath the hemidiaphragm but significantly decreased now measuring approximately 13 mm in thickness decreased from 40 mm in thickness. No other sizable component of the previously seen collection is noted. A small extraluminal collection is noted best seen on image number 37 of series 5 lateral to the splenic flexure measuring approximately 3.3 x 2.2 cm. This is stable in appearance from the prior exam and likely represents a separate cyst. Spleen: Spleen is within normal limits. Adrenals/Urinary Tract: Adrenal glands are within normal limits. Kidneys demonstrate a normal enhancement pattern. Stomach/Bowel: Stomach is decompressed. The degree of mass effect upon the stomach has resolved following cyst drainage. Visualized large and small bowel is within normal limits. Vascular/Lymphatic: Aortic atherosclerosis. No enlarged abdominal or pelvic lymph nodes. Other: None Musculoskeletal: No acute or significant osseous findings. IMPRESSION: There is been significant decompression of the multiloculated cystic structure in the left upper quadrant following transgastric cyst drainage. There remains a E tiny component interposed between the stomach and spleen within multiple collapsed walls as well as a small curvilinear collection posterior to the spleen which is significantly reduced in size. A separate small cystic lesion is noted lateral to the splenic flexure as described. This is stable from the prior exam. Persistent pseudocyst in the pancreatic tail. No other focal abnormality is noted. Electronically Signed   By: Oneil Devonshire M.D.   On: 05/30/2024 23:10   CT ABDOMEN PELVIS W CONTRAST Result Date: 05/28/2024 CLINICAL DATA:  Severe pancreatitis, pancreatic pseudocyst EXAM: CT ABDOMEN AND PELVIS WITH CONTRAST TECHNIQUE: Multidetector CT imaging of the abdomen and pelvis was performed using the standard protocol following bolus  administration of intravenous contrast. RADIATION DOSE REDUCTION:  This exam was performed according to the departmental dose-optimization program which includes automated exposure control, adjustment of the mA and/or kV according to patient size and/or use of iterative reconstruction technique. CONTRAST:  75mL OMNIPAQUE  IOHEXOL  350 MG/ML SOLN COMPARISON:  05/21/2024 FINDINGS: Lower chest: Severe elevation of the left hemidiaphragm left upper quadrant incompletely imaged. Small left pleural effusion associated atelectasis or consolidation. Hepatobiliary: No focal liver abnormality is seen. Hepatomegaly, maximum coronal span 23.4 cm. Hepatic steatosis. Status post cholecystectomy. No biliary dilatation. Pancreas: Unchanged appearance of the pancreas, with a cystic lesion within the pancreatic tail measuring 2.2 x 1.6 cm (series 3, image 28). Similar inflammatory fat stranding and fluid in the left upper quadrant. Spleen: Normal in size without significant abnormality. Adrenals/Urinary Tract: Adrenal glands are unremarkable. Kidneys are normal, without renal calculi, solid lesion, or hydronephrosis. Bladder is unremarkable. Stomach/Bowel: Stomach is within normal limits. Appendix appears normal. No evidence of bowel wall thickening, distention, or inflammatory changes. Vascular/Lymphatic: Aortic atherosclerosis. No enlarged abdominal or pelvic lymph nodes. Reproductive: No mass or other significant abnormality. IUD present in the fundal endometrial cavity. Other: No abdominal wall hernia or abnormality. Large, complex serpiginous multiloculated fluid collection occupying the majority of the left upper quadrant, about the spleen, gastric fundus, and tip of the pancreas, largest component immediately under the diaphragm measuring 13.4 x 8.4 cm, not significantly changed (series 6, image 6, series 6, image 63). Very slightly diminished volume of free and loculated in the ventral left upper quadrant (series 3, image 61).  Musculoskeletal: No acute or significant osseous findings. IMPRESSION: 1. Large, complex serpiginous multiloculated fluid collection occupying the majority of the left upper quadrant, about the spleen, gastric fundus, and tip of the pancreas, largest component immediately under the diaphragm measuring 13.4 x 8.4 cm, not significantly changed. Very slightly diminished volume of free and loculated fluid in the ventral left upper quadrant. Findings remain consistent with a large pancreatic pseudocyst. 2. Unchanged appearance of the pancreas, with a pseudocyst within the pancreatic tail measuring 2.2 x 1.6 cm. Similar inflammatory fat stranding and fluid in the left upper quadrant. 3. Severe elevation of the left hemidiaphragm, left upper quadrant incompletely imaged. Small left pleural effusion associated atelectasis or consolidation. 4. Hepatomegaly and hepatic steatosis. Aortic Atherosclerosis (ICD10-I70.0). Electronically Signed   By: Marolyn JONETTA Jaksch M.D.   On: 05/28/2024 12:24   CT ABDOMEN PELVIS W CONTRAST Result Date: 05/21/2024 CLINICAL DATA:  38 year old female with history of pancreatic pseudocyst status post image guided aspiration on 05/18/2024. EXAM: CT ABDOMEN AND PELVIS WITH CONTRAST TECHNIQUE: Multidetector CT imaging of the abdomen and pelvis was performed using the standard protocol following bolus administration of intravenous contrast. RADIATION DOSE REDUCTION: This exam was performed according to the departmental dose-optimization program which includes automated exposure control, adjustment of the mA and/or kV according to patient size and/or use of iterative reconstruction technique. CONTRAST:  75mL OMNIPAQUE  IOHEXOL  350 MG/ML SOLN COMPARISON:  05/17/2024 FINDINGS: Lower chest: Similar appearing small left pleural effusion with associated left basilar passive atelectasis. Hepatobiliary: No focal liver abnormality is seen. Status post cholecystectomy. No biliary dilatation. Pancreas: Similar normal  appearing head and body of the pancreas with large multiloculated cystic lesion extending from the pancreatic tail superiorly to the greater curvature of the stomach and perisplenic regions. The collection is overall similar to most recent comparison CT. Spleen: Splenic parenchyma is normal in size enhancement. Again seen is a large subcapsular appearing splenic cystic lesion with peripheral enhancement arising from the superior aspect which measures up  to approximately 13.6 x 8.0 cm in maximum axial dimensions. Adrenals/Urinary Tract: Adrenal glands are unremarkable. Kidneys are normal, without renal calculi, focal lesion, or hydronephrosis. Bladder is unremarkable. Stomach/Bowel: Stomach is within normal limits, nearly collapsed extrinsic compression from neighboring pancreatic cysts. Appendix appears normal. No evidence of bowel wall thickening, distention, or inflammatory changes. Vascular/Lymphatic: No significant vascular findings are present. No enlarged abdominal or pelvic lymph nodes. Reproductive: Intrauterine device in place in unchanged position. Uterus and bilateral adnexa are otherwise unremarkable. Other: Interval development of crescentic small volume ascites in the left upper quadrant. Musculoskeletal: No acute osseous abnormality. Similar appearing advanced discogenic degenerative change at L3-L4 with unchanged anterolisthesis at this level. IMPRESSION: 1. Similar appearing large multiloculated cystic lesion extending from the pancreatic tail superiorly to the greater curvature of the stomach and perisplenic regions. The collection is overall similar to most recent comparison CT. 2. Interval development of crescentic small volume ascites in the left upper quadrant. Suspect pancreatic pseudocyst contents after recent instrumentation. 3. Similar appearing small left pleural effusion with associated left basilar passive atelectasis. Ester Sides, MD [facility]  [facility] Radiology Electronically Signed   By: Ester Sides M.D.   On: 05/21/2024 21:23   (Echo, Carotid, EGD, Colonoscopy, ERCP)    Subjective: Patient seen in the morning rounds.  Denies any nausea or vomiting.  She wanted to drink some liquids as she was n.p.o. over midnight.  Pain is persistent but controlled with IV pain medications.  Left flank is swollen and now more tender.   Discharge Exam: Vitals:   06/18/24 0654 06/18/24 0734  BP:  (!) 170/105  Pulse:  (!) 114  Resp: 20 20  Temp:  97.8 F (36.6 C)  SpO2:  98%   Vitals:   06/18/24 0600 06/18/24 0649 06/18/24 0654 06/18/24 0734  BP:    (!) 170/105  Pulse:    (!) 114  Resp: (!) 23 (!) 24 20 20   Temp:    97.8 F (36.6 C)  TempSrc:    Oral  SpO2:    98%  Weight:      Height:       General exam: In mild distress due to pain.  Appropriately anxious. Respiratory system: Clear to auscultation.  Poor inspiratory importer. Cardiovascular system: S1 & S2 heard, RRR. No JVD, murmurs, rubs, gallops or clicks. No pedal edema. Gastrointestinal system: Soft.  Diffusely tender on deep palpation.  Palpable subcutaneous swelling left flank with no areas of redness or erythema.  Bowel sounds present. Central nervous system: Alert and oriented. No focal neurological deficits.  Anxious.     The results of significant diagnostics from this hospitalization (including imaging, microbiology, ancillary and laboratory) are listed below for reference.     Microbiology: Recent Results (from the past 240 hours)  Blood culture (routine x 2)     Status: None (Preliminary result)   Collection Time: 06/13/24 11:04 PM   Specimen: BLOOD  Result Value Ref Range Status   Specimen Description BLOOD LEFT ANTECUBITAL  Final   Special Requests   Final    BOTTLES DRAWN AEROBIC AND ANAEROBIC Blood Culture adequate volume   Culture   Final    NO GROWTH 3 DAYS Performed at [facility], [address] [place], [place] [address]    Report  Status PENDING  Incomplete  Blood culture (routine x 2)     Status: None (Preliminary result)   Collection Time: 06/13/24 11:40 PM   Specimen: BLOOD RIGHT  HAND  Result Value Ref Range Status   Specimen Description BLOOD RIGHT HAND  Final   Special Requests   Final    BOTTLES DRAWN AEROBIC AND ANAEROBIC Blood Culture adequate volume   Culture   Final    NO GROWTH 3 DAYS Performed at [facility], [address] [place], [place] [address]    Report Status PENDING  Incomplete     Labs: BNP (last 3 results) Recent Labs    06/18/24 0911  BNP 401.9*   Basic Metabolic Panel: Recent Labs  Lab 06/13/24 1934 06/14/24 0651 06/16/24 0433 06/17/24 0404 06/18/24 0404  NA 138 133* 133* 136 134*  K 4.2 4.0 4.1 4.4 3.7  CL 102 103 103 106 105  CO2 26 18* 19* 18* 21*  GLUCOSE 104* 97 99 112* 133*  BUN 12 13 15 10  5*  CREATININE 0.79 0.82 1.22* 0.90 0.69  CALCIUM 10.0 8.5* 7.9* 8.0* 8.0*  MG  --  1.6* 1.7  --   --    Liver Function Tests: Recent Labs  Lab 06/13/24 1934 06/14/24 0651  AST 17 15  ALT 21 20  ALKPHOS 97 93  BILITOT <0.2 0.5  PROT 7.4 6.4*  ALBUMIN 3.1* 2.7*   Recent Labs  Lab 06/13/24 1934 06/14/24 0651  LIPASE 320* 331*   No results for input(s): AMMONIA in the last 168 hours. CBC: Recent Labs  Lab 06/14/24 0651 06/15/24 0424 06/16/24 0433 06/17/24 0404 06/18/24 0404  WBC 20.4* 37.0* 40.0* 30.3* 23.0*  NEUTROABS 14.7*  --  33.8* 25.0* 19.1*  HGB 11.0* 10.6* 8.4* 8.6* 8.3*  HCT 36.4 34.6* 27.3* 28.2* 27.1*  MCV 80.5 80.7 80.5 81.0 80.4  PLT 685* 507* 496* 510* 556*   Cardiac Enzymes: No results for input(s): CKTOTAL, CKMB, CKMBINDEX, TROPONINI in the last 168 hours. BNP: Invalid input(s): POCBNP CBG: Recent Labs  Lab 06/15/24 0059 06/15/24 0330 06/15/24 0725 06/15/24 1117 06/17/24 0732  GLUCAP 96 134* 126* 118* 87   D-Dimer No results for input(s): DDIMER in the last 72 hours. Hgb A1c No results for input(s):  HGBA1C in the last 72 hours. Lipid Profile No results for input(s): CHOL, HDL, LDLCALC, TRIG, CHOLHDL, LDLDIRECT in the last 72 hours. Thyroid function studies No results for input(s): TSH, T4TOTAL, T3FREE, THYROIDAB in the last 72 hours.  Invalid input(s): FREET3 Anemia work up No results for input(s): VITAMINB12, FOLATE, FERRITIN, TIBC, IRON , RETICCTPCT in the last 72 hours. Urinalysis    Component Value Date/Time   COLORURINE YELLOW 06/13/2024 1934   APPEARANCEUR HAZY (A) 06/13/2024 1934   LABSPEC 1.029 06/13/2024 1934   PHURINE 6.0 06/13/2024 1934   GLUCOSEU NEGATIVE 06/13/2024 1934   HGBUR NEGATIVE 06/13/2024 1934   BILIRUBINUR NEGATIVE 06/13/2024 1934   KETONESUR NEGATIVE 06/13/2024 1934   PROTEINUR 30 (A) 06/13/2024 1934   UROBILINOGEN 0.2 12/20/2009 2301   NITRITE NEGATIVE 06/13/2024 1934   LEUKOCYTESUR NEGATIVE 06/13/2024 1934   Sepsis Labs Recent Labs  Lab 06/15/24 0424 06/16/24 0433 06/17/24 0404 06/18/24 0404  WBC 37.0* 40.0* 30.3* 23.0*   Microbiology Recent Results (from the past 240 hours)  Blood culture (routine x 2)     Status: None (Preliminary result)   Collection Time: 06/13/24 11:04 PM   Specimen: BLOOD  Result Value Ref Range Status   Specimen Description BLOOD LEFT ANTECUBITAL  Final   Special Requests   Final    BOTTLES DRAWN AEROBIC AND ANAEROBIC Blood Culture adequate volume   Culture   Final  NO GROWTH 3 DAYS Performed at [facility], [address] [place], [place] [address]    Report Status PENDING  Incomplete  Blood culture (routine x 2)     Status: None (Preliminary result)   Collection Time: 06/13/24 11:40 PM   Specimen: BLOOD RIGHT HAND  Result Value Ref Range Status   Specimen Description BLOOD RIGHT HAND  Final   Special Requests   Final    BOTTLES DRAWN AEROBIC AND ANAEROBIC Blood Culture adequate volume   Culture   Final    NO GROWTH 3 DAYS Performed at [facility],  [address] [place], [place] [address]    Report Status PENDING  Incomplete     Time coordinating discharge: 40 minutes  SIGNED:   Renato Applebaum, MD  [facility] 06/18/2024, 10:43 AM

## 2024-06-17 NOTE — Progress Notes (Signed)
 Patient ID: Katrina Lambert, female   DOB: February 12, 1986, 38 y.o.   MRN: [id number]    Progress Note   Subjective   Day # 4 CC; recurrent abdominal pain nausea vomiting fever in patient with prolonged course secondary to complicated pancreatitis with large pseudocyst, status post EUS with cystogastrostomy 06/06/2024/[facility]  Tmax 99 1  IV Zosyn  Lovenox  stopped  Blood cultures no growth x 3 days WBC 30.3 down from 40,000 yesterday Hemoglobin 8.6/hematocrit 28.2 stable Potassium 4.4/BUN 10/creatinine 0.9  IR reviewed her images, and feel that she is too high risk for complications given the location of the fluid collection abutting the diaphragm.  Patient says she feels about the same, still feeling majority of pain in the left upper quadrant and left lateral abdomen but says it seems to be extending across her upper abdomen again.  Feels like I am filling up Pain tolerable with IV pain medications No nausea or vomiting currently, would like to try full liquids, taking protein shakes    Objective   Vital signs in last 24 hours: Temp:  [98.2 F (36.8 C)-99.1 F (37.3 C)] 98.2 F (36.8 C) (08/02 0417) Pulse Rate:  [106-123] 106 (08/02 0729) Resp:  [16-18] 18 (08/02 0729) BP: (107-144)/(46-93) 144/93 (08/02 0729) SpO2:  [95 %-100 %] 100 % (08/02 0729) Last BM Date :  (PTA) General:    Young white female in NAD Heart:  Regular rate and rhythm; no murmurs Lungs: Respirations even and unlabored, decreased breath sounds left base Abdomen:  Soft, obese she is now more tender across the epigastrium, and left upper quadrant, fulfilling and also quite tender down into the left lateral area and left flank, normal bowel sounds. Extremities:  Without edema. Neurologic:  Alert and oriented,  grossly normal neurologically. Psych:  Cooperative. Normal mood and affect.  Intake/Output from previous day: 08/01 0701 - 08/02 0700 In: 2038.9 [P.O.:120; I.V.:1510.4; IV Piggyback:408.5] Out: -   Intake/Output this shift: No intake/output data recorded.  Lab Results: Recent Labs    06/15/24 0424 06/16/24 0433 06/17/24 0404  WBC 37.0* 40.0* 30.3*  HGB 10.6* 8.4* 8.6*  HCT 34.6* 27.3* 28.2*  PLT 507* 496* 510*   BMET Recent Labs    06/16/24 0433 06/17/24 0404  NA 133* 136  K 4.1 4.4  CL 103 106  CO2 19* 18*  GLUCOSE 99 112*  BUN 15 10  CREATININE 1.22* 0.90  CALCIUM 7.9* 8.0*   LFT No results for input(s): PROT, ALBUMIN, AST, ALT, ALKPHOS, BILITOT, BILIDIR, IBILI in the last 72 hours. PT/INR No results for input(s): LABPROT, INR in the last 72 hours.  Studies/Results: CT ABDOMEN PELVIS W CONTRAST Result Date: 06/16/2024 CLINICAL DATA:  Pancreatitis. EXAM: CT ABDOMEN AND PELVIS WITH CONTRAST TECHNIQUE: Multidetector CT imaging of the abdomen and pelvis was performed using the standard protocol following bolus administration of intravenous contrast. RADIATION DOSE REDUCTION: This exam was performed according to the departmental dose-optimization program which includes automated exposure control, adjustment of the mA and/or kV according to patient size and/or use of iterative reconstruction technique. CONTRAST:  OMNIPAQUE  IOHEXOL  350 MG/ML SOLN COMPARISON:  June 13, 2024. FINDINGS: Lower chest: Left pleural effusion is noted with associated atelectasis or infiltrate of left lower lobe. Hepatobiliary: No focal liver abnormality is seen. Status post cholecystectomy. No biliary dilatation. Pancreas: Stable 11 x 8 mm cystic areas seen in pancreatic tail. Minimal inflammatory changes are noted around the pancreatic tail. Spleen: Normal in size without focal abnormality. Adrenals/Urinary Tract: Adrenal glands  are unremarkable. Kidneys are normal, without renal calculi, focal lesion, or hydronephrosis. Bladder is unremarkable. Stomach/Bowel: There is again noted internal drainage catheter extending from loculated pancreatic pseudocyst in left upper quadrant  into proximal gastric lumen. The fluid collection measures 11.7 by 2.2 cm and demonstrates enhancing margins. It extends into left subdiaphragmatic space. 5.3 x 3.2 cm fluid collection is noted just underneath the left hemidiaphragm which is not significantly changed compared to prior exam. The drainage catheter does not appear to extend into the remaining fluid collections. There is no evidence of large or small bowel dilatation or inflammation. The appendix is unremarkable. Vascular/Lymphatic: Aortic atherosclerosis. No enlarged abdominal or pelvic lymph nodes. Reproductive: Intrauterine device is noted. No adnexal abnormality is noted. Other: No definite hernia is noted. 5.6 x 2.0 cm fluid collection is noted laterally in left upper quadrant which is decreased in size compared to prior exam. This most likely is related to pancreatic pseudocyst. 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 Severe degenerative disc disease is noted at this level as well. No acute osseous abnormality is noted. IMPRESSION: Continued presence of multiloculated fluid collection in left upper quadrant most consistent with pancreatic pseudocyst. There is noted continued presence of drain extending from possible location of pseudo cyst into gastric lumen, but there does not appear to be any definite fluid collection around the drain currently. Minimal inflammatory changes are noted around the pancreatic tail. Stable cystic abnormality is noted in pancreatic tail as well, which may represent intraparenchymal pseudocyst. Left pleural effusion is noted with associated left basilar atelectasis or infiltrate. Aortic Atherosclerosis (ICD10-I70.0). Electronically Signed   By: Lynwood Landy Raddle M.D.   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  Recent prolonged hospitalization due to enlarging pseudocysts  Patient had 2 EUS's here with cyst aspiration of the largest pseudocyst without evidence of abscess however had rapid reaccumulation of fluid suggestive of pancreatic duct disruption.  [facility] was contacted for pancreatic ERCP/pancreatic duct stent placement, she was transferred there 06/04/2024 ultimately underwent EUS with cystogastrostomy on 06/06/2024, did not undergo pancreatic duct stent placement. She was discharged from [facility] the following day, did feel better, however after a few days developed increase in left upper quadrant and left lateral abdominal pain into her flank then fever to 1016 and readmitted here.  Met SIRS criteria, covering with IV Zosyn  Blood cultures negative  Initial CT here shows the previous dominant pseudocyst significantly decreased in size however the left upper quadrant lateral fluid collection increased to 5.1 x 2.1 in the more inferior collection was unchanged  He developed progressive leukocytosis, remains tachycardic and has had increased pain initially more in the left lateral abdomen and flank but now complaining today more extending across the upper abdomen  Repeat CT continued presence of the drain possibly from the pseudocyst into the gastric lumen the dominant fluid collection measures 11.7 x 2.2 cm increased in size and extends into the left subdiaphragmatic space, there is also a 5.3 x 3.2 cm fluid collection just underneath the left hemidiaphragm not significantly changed, and a 5.6 x 2 cm collection laterally in the left upper quadrant decreased in size related to previous  Patient is currently on the list to be transferred to [facility] tomorrow for advanced biliary management of her complicated  multi loculated pseudocysts with enlargement of the dominant cyst despite cystogastrostomy on 06/06/2024  There is still concern for component of pancreatic duct disruption On  low-dose octreotide  100 SQ twice daily  Patient is stable and leukocytosis improved today  IR does not feel she is a candidate for percutaneous drainage, too high risk  Plan; continue IV Zosyn  Advance to full liquids, continue protein shakes Repeat labs in a.m. Hopefully patient can be transferred to [facility] tomorrow for further management per advanced biliary specialists.   Principal Problem:   Acute pancreatitis Active Problems:   Abdominal fluid collection   SIRS (systemic inflammatory response syndrome) (HCC)     LOS: 3 days   Ethanael Veith EsterwoodPA-C  06/17/2024, 11:27 AM

## 2024-06-17 NOTE — Plan of Care (Addendum)
 IR was requested for image guided aspiration and possible drain placement for the left pancreatic pseudocyst.   Case was reviewed by Dr. Jenna.  As patient is waiting to be transfer to [facility] for more definitive treatment, no IR intervention is recommended.  IR intervention will be very high risk due to the location of the left pancreatic pseudocyst near the left diaphragm, aspiration/drain placement may cause the pseudocyst to spill into the lung. Also patient has multiple pseudocysts, she will need multiple drain placement. Strongly recommend the patient to be seen at the tertiary center for more definitive treatment.     Ordering provider notified.   Will delete the IR rad eval order.  Please call IR for questions and concerns.   Kyliah Deanda H Rishawn Walck PA-C 06/17/2024 10:05 AM

## 2024-06-18 ENCOUNTER — Inpatient Hospital Stay (HOSPITAL_COMMUNITY): Payer: MEDICAID

## 2024-06-18 DIAGNOSIS — K8502 Idiopathic acute pancreatitis with infected necrosis: Secondary | ICD-10-CM | POA: Diagnosis not present

## 2024-06-18 LAB — BASIC METABOLIC PANEL WITH GFR
Anion gap: 8 (ref 5–15)
BUN: 5 mg/dL — ABNORMAL LOW (ref 6–20)
CO2: 21 mmol/L — ABNORMAL LOW (ref 22–32)
Calcium: 8 mg/dL — ABNORMAL LOW (ref 8.9–10.3)
Chloride: 105 mmol/L (ref 98–111)
Creatinine, Ser: 0.69 mg/dL (ref 0.44–1.00)
GFR, Estimated: >60 mL/min (ref >60)
Glucose, Bld: 133 mg/dL — ABNORMAL HIGH (ref 70–99)
Potassium: 3.7 mmol/L (ref 3.5–5.1)
Sodium: 134 mmol/L — ABNORMAL LOW (ref 135–145)

## 2024-06-18 LAB — CBC WITH DIFFERENTIAL/PLATELET
Abs Immature Granulocytes: 0.24 K/uL — ABNORMAL HIGH (ref 0.00–0.07)
Basophils Absolute: 0.1 K/uL (ref 0.0–0.1)
Basophils Relative: 0 %
Eosinophils Absolute: 0.2 K/uL (ref 0.0–0.5)
Eosinophils Relative: 1 %
HCT: 27.1 % — ABNORMAL LOW (ref 36.0–46.0)
Hemoglobin: 8.3 g/dL — ABNORMAL LOW (ref 12.0–15.0)
Immature Granulocytes: 1 %
Lymphocytes Relative: 9 %
Lymphs Abs: 2.2 K/uL (ref 0.7–4.0)
MCH: 24.6 pg — ABNORMAL LOW (ref 26.0–34.0)
MCHC: 30.6 g/dL (ref 30.0–36.0)
MCV: 80.4 fL (ref 80.0–100.0)
Monocytes Absolute: 1.2 K/uL — ABNORMAL HIGH (ref 0.1–1.0)
Monocytes Relative: 5 %
Neutro Abs: 19.1 K/uL — ABNORMAL HIGH (ref 1.7–7.7)
Neutrophils Relative %: 84 %
Platelets: 556 K/uL — ABNORMAL HIGH (ref 150–400)
RBC: 3.37 MIL/uL — ABNORMAL LOW (ref 3.87–5.11)
RDW: 15.9 % — ABNORMAL HIGH (ref 11.5–15.5)
WBC: 23 K/uL — ABNORMAL HIGH (ref 4.0–10.5)
nRBC: 0 % (ref 0.0–0.2)

## 2024-06-18 LAB — TROPONIN I (HIGH SENSITIVITY): Troponin I (High Sensitivity): 10 ng/L (ref <18)

## 2024-06-18 LAB — BRAIN NATRIURETIC PEPTIDE: B Natriuretic Peptide: 401.9 pg/mL — ABNORMAL HIGH (ref 0.0–100.0)

## 2024-06-18 MED ORDER — NICOTINE 14 MG/24HR TD PT24
14.0000 mg | MEDICATED_PATCH | Freq: Every day | TRANSDERMAL | Status: DC
  Administered 2024-06-18: 14 mg via TRANSDERMAL
  Filled 2024-06-18: qty 1

## 2024-06-18 MED ORDER — LORAZEPAM 0.5 MG PO TABS
0.5000 mg | ORAL_TABLET | Freq: Once | ORAL | Status: AC | PRN
  Administered 2024-06-18: 0.5 mg via ORAL
  Filled 2024-06-18: qty 1

## 2024-06-18 MED ORDER — LORAZEPAM 0.5 MG PO TABS
0.5000 mg | ORAL_TABLET | Freq: Four times a day (QID) | ORAL | Status: AC | PRN
  Administered 2024-06-18: 0.5 mg via ORAL
  Filled 2024-06-18: qty 1

## 2024-06-18 MED ORDER — POLYETHYLENE GLYCOL 3350 17 G PO PACK
17.0000 g | PACK | Freq: Every day | ORAL | Status: DC
  Administered 2024-06-18: 17 g via ORAL
  Filled 2024-06-18: qty 1

## 2024-06-18 MED ORDER — NICOTINE 14 MG/24HR TD PT24: 14.0000 mg | MEDICATED_PATCH | Freq: Every day | TRANSDERMAL | Status: AC

## 2024-06-18 NOTE — Progress Notes (Signed)
 Receiving the call from ms. [facility] Transfer Department) notified that pt got the bed at [facility] located at [address], [place] [address]),  room 9114. RN called report to inkrit, RN at [phone]. Pt going to [facility] via [facility].   Amado GORMAN Arabia, RN

## 2024-06-18 NOTE — Progress Notes (Signed)
 PROGRESS NOTE    Katrina Lambert  FMW:[id number] DOB: 1986/03/25 DOA: 06/13/2024 PCP: [facility]    Brief Narrative:  Severe complicated pancreatitis started 03/2024.  Pseudocyst, infection, most recent hospitalization at [facility] treated with cystogastrostomy.  Recent hospitalization with complicated pleural effusion treated with chest tube.  Discharged from [facility] from 7/23.  Had some relief for few days but again recurred so came back to hospital 7/30.  Came back with severe pain and low-grade fever.     In the ED her Tmax was 99.1, she had a white count of 20,600, CT of the abdomen and pelvis revealed decreasing size of the fluid collection under the left hemidiaphragm now measuring 4.9 x 2.9 cm compared with 8 x 5.8 cm previously.  Unfortunately the fluid collection lateral to the left upper quadrant now measures 5.1 x 2.1 cm which is larger than the prior study when this was not measurable on previous exam.  There was also a fluid collection laterally lower in the left abdomen that is unchanged in size.  Her lipase was 320.  Because of the leukocytosis she was started on Rocephin  2 g IV.  She was given IV Dilaudid  for pain and Zofran  for nausea.  Admitted along with GI consultation.      Subjective: Patient seen and examined.  Overnight she had some panic attacks and shortness of breath.  Chest x-ray done shows some infiltrate on the left side.  Afebrile.  Pain is controlled.  Tolerating full liquid diet.  Wants to get up and walk around.  She wants to do housekeeping on her room by herself. WBC count trending down to 23K today.  She wants to take some laxatives and some medicine for anxiety as well.   Assessment & Plan:   Severe complicated and recurrent pancreatitis Pseudocyst, complicated and infected.  Now infiltrating into soft tissue.   Tolerating full liquid diet.  Continue low-dose IV fluids.  Adequate IV and oral pain medications.  Continue IV Zosyn  for empiric treatment.   Blood cultures negative so far. Repeat CT scan 8/1 with increasing size of abdominal fluid collection approximating the left abdominal wall/flank.  Patient is currently clinically stabilized but she will need advance endoscopy procedure.  She will be transferred to [facility] when bed is available. Seen by IR, did not recommend percutaneous drainage as she is going to have endoscopic procedure.    Essential hypertension: Holding lisinopril  with risk of dehydration.  Blood pressures adequate.   Hypomagnesemia: Was replaced.  Adequate.   Bipolar disorder: Currently not on medications.   Patient is medically stable to transfer to tertiary care center for advance care.   DVT prophylaxis: SCDs Start: 06/14/24 0424   Code Status: Full code Family Communication: None at the bedside Disposition Plan: Status is: Inpatient Remains inpatient appropriate because: Severe abdominal pain, IV antibiotics.  Waiting for transfer.     Consultants:  Gastroenterology  Procedures:  None  Antimicrobials:  Zosyn  7/30---     Objective: Vitals:   06/18/24 0600 06/18/24 0649 06/18/24 0654 06/18/24 0734  BP:    (!) 170/105  Pulse:    (!) 114  Resp: (!) 23 (!) 24 20 20   Temp:    97.8 F (36.6 C)  TempSrc:    Oral  SpO2:    98%  Weight:      Height:        Intake/Output Summary (Last 24 hours) at 06/18/2024 1044 Last data filed at 06/18/2024 0600 Gross  per 24 hour  Intake 4718.72 ml  Output --  Net 4718.72 ml   Filed Weights   06/13/24 1817 06/15/24 0500 06/18/24 0545  Weight: 108.9 kg 109.5 kg 115.5 kg    Examination:  General exam: Looks fairly comfortable making her own bed. Respiratory system: Clear to auscultation.  Poor inspiratory efforts. Cardiovascular system: S1 & S2 heard, RRR. No JVD, murmurs, rubs, gallops or clicks. No pedal edema. Gastrointestinal system: Soft.  Diffusely tender on deep palpation.  Palpable subcutaneous swelling left flank with no areas of  redness or erythema.  Bowel sounds present. Central nervous system: Alert and oriented. No focal neurological deficits.  Anxious.    Data Reviewed: I have personally reviewed following labs and imaging studies  CBC: Recent Labs  Lab 06/14/24 0651 06/15/24 0424 06/16/24 0433 06/17/24 0404 06/18/24 0404  WBC 20.4* 37.0* 40.0* 30.3* 23.0*  NEUTROABS 14.7*  --  33.8* 25.0* 19.1*  HGB 11.0* 10.6* 8.4* 8.6* 8.3*  HCT 36.4 34.6* 27.3* 28.2* 27.1*  MCV 80.5 80.7 80.5 81.0 80.4  PLT 685* 507* 496* 510* 556*   Basic Metabolic Panel: Recent Labs  Lab 06/13/24 1934 06/14/24 0651 06/16/24 0433 06/17/24 0404 06/18/24 0404  NA 138 133* 133* 136 134*  K 4.2 4.0 4.1 4.4 3.7  CL 102 103 103 106 105  CO2 26 18* 19* 18* 21*  GLUCOSE 104* 97 99 112* 133*  BUN 12 13 15 10  5*  CREATININE 0.79 0.82 1.22* 0.90 0.69  CALCIUM 10.0 8.5* 7.9* 8.0* 8.0*  MG  --  1.6* 1.7  --   --    GFR: Estimated Creatinine Clearance: 122.2 mL/min (by C-G formula based on SCr of 0.69 mg/dL). Liver Function Tests: Recent Labs  Lab 06/13/24 1934 06/14/24 0651  AST 17 15  ALT 21 20  ALKPHOS 97 93  BILITOT <0.2 0.5  PROT 7.4 6.4*  ALBUMIN 3.1* 2.7*   Recent Labs  Lab 06/13/24 1934 06/14/24 0651  LIPASE 320* 331*   No results for input(s): AMMONIA in the last 168 hours. Coagulation Profile: No results for input(s): INR, PROTIME in the last 168 hours. Cardiac Enzymes: No results for input(s): CKTOTAL, CKMB, CKMBINDEX, TROPONINI in the last 168 hours. BNP (last 3 results) No results for input(s): PROBNP in the last 8760 hours. HbA1C: No results for input(s): HGBA1C in the last 72 hours. CBG: Recent Labs  Lab 06/15/24 0059 06/15/24 0330 06/15/24 0725 06/15/24 1117 06/17/24 0732  GLUCAP 96 134* 126* 118* 87   Lipid Profile: No results for input(s): CHOL, HDL, LDLCALC, TRIG, CHOLHDL, LDLDIRECT in the last 72 hours. Thyroid Function Tests: No results for  input(s): TSH, T4TOTAL, FREET4, T3FREE, THYROIDAB in the last 72 hours. Anemia Panel: No results for input(s): VITAMINB12, FOLATE, FERRITIN, TIBC, IRON , RETICCTPCT in the last 72 hours. Sepsis Labs: Recent Labs  Lab 06/13/24 1939  LATICACIDVEN 1.5    Recent Results (from the past 240 hours)  Blood culture (routine x 2)     Status: None (Preliminary result)   Collection Time: 06/13/24 11:04 PM   Specimen: BLOOD  Result Value Ref Range Status   Specimen Description BLOOD LEFT ANTECUBITAL  Final   Special Requests   Final    BOTTLES DRAWN AEROBIC AND ANAEROBIC Blood Culture adequate volume   Culture   Final    NO GROWTH 3 DAYS Performed at [facility], [address], [place], [place] [address]    Report Status PENDING  Incomplete  Blood culture (routine  x 2)     Status: None (Preliminary result)   Collection Time: 06/13/24 11:40 PM   Specimen: BLOOD RIGHT HAND  Result Value Ref Range Status   Specimen Description BLOOD RIGHT HAND  Final   Special Requests   Final    BOTTLES DRAWN AEROBIC AND ANAEROBIC Blood Culture adequate volume   Culture   Final    NO GROWTH 3 DAYS Performed at [facility], [address] [place], [place] [address]    Report Status PENDING  Incomplete         Radiology Studies: DG CHEST PORT 1 VIEW Result Date: 06/18/2024 EXAM: 1 VIEW XRAY OF THE CHEST 06/18/2024 03:05:36 AM COMPARISON: Radiograph 04/21/2024 and CT Jun 13 2024 CLINICAL HISTORY: 141880 SOB (shortness of breath) 141880. Reason for exam: shortness of breath FINDINGS: LUNGS AND PLEURA: Increased size of the now moderate left pleural effusion. Associated atelectasis or pneumonia in the left lower lobe. Patchy opacities in the right lung may be due to atelectasis or pneumonia. HEART AND MEDIASTINUM: Cardiomegaly. Pulmonary vascular congestion. IMPRESSION: 1. Moderate left pleural effusion with associated left lower lobe atelectasis or pneumonia. 2. Patchy  opacities in the right lung, possibly due to atelectasis or pneumonia. 3. Cardiomegaly and pulmonary vascular congestion. Electronically signed by: Norman Gatlin MD 06/18/2024 03:11 AM EDT RP Workstation: [id number]        Scheduled Meds:  feeding supplement  237 mL Oral TID BM   nicotine   14 mg Transdermal Daily   octreotide   100 mcg Subcutaneous Q12H   ondansetron  (ZOFRAN ) IV  4 mg Intravenous Q6H   pantoprazole  (PROTONIX ) IV  40 mg Intravenous Q24H   polyethylene glycol  17 g Oral Daily   sodium chloride  flush  3 mL Intravenous Q12H   Continuous Infusions:  sodium chloride  50 mL/hr at 06/18/24 0830   piperacillin -tazobactam (ZOSYN )  IV 3.375 g (06/18/24 0831)   promethazine  (PHENERGAN ) injection (IM or IVPB) 150 mL/hr at 06/18/24 0600     LOS: 4 days    Time spent: 55 minutes    Renato Applebaum, MD [facility]

## 2024-06-18 NOTE — Plan of Care (Signed)
  Problem: Education: Goal: Knowledge of General Education information will improve Description: Including pain rating scale, medication(s)/side effects and non-pharmacologic comfort measures Outcome: Progressing   Problem: Clinical Measurements: Goal: Diagnostic test results will improve Outcome: Progressing   Problem: Clinical Measurements: Goal: Respiratory complications will improve Outcome: Progressing   Problem: Clinical Measurements: Goal: Cardiovascular complication will be avoided Outcome: Progressing   Problem: Activity: Goal: Risk for activity intolerance will decrease Outcome: Progressing   Problem: Nutrition: Goal: Adequate nutrition will be maintained Outcome: Progressing   Problem: Coping: Goal: Level of anxiety will decrease Outcome: Progressing   Problem: Pain Managment: Goal: General experience of comfort will improve and/or be controlled Outcome: Progressing   Problem: Safety: Goal: Ability to remain free from injury will improve Outcome: Progressing   Problem: Skin Integrity: Goal: Risk for impaired skin integrity will decrease Outcome: Progressing

## 2024-06-18 NOTE — Progress Notes (Signed)
 Patient ID: Katrina Lambert, female   DOB: May 01, 1986, 38 y.o.   MRN: [id number]    Progress Note   Subjective   Day # 5 CC; recurrent abdominal pain nausea vomiting fever in patient with prolonged course secondary to complicated pancreatitis with large pseudocysts, status post EUS with cystogastrostomy at [facility] 06/06/2024  Awaiting transfer to [facility]   Tmax 99 5  IV Zosyn   Labs today-WBC 23,000/hemoglobin 8.3/hematocrit 27.1/platelets 556 Sodium 134/potassium 3.7/BUN 5/creatinine 0.69 BNP 401  Chest x-ray early a.m.-moderate left pleural effusion and associated atelectasis or pneumonia, patchy opacities right lung cardiomegaly mild vascular congestion  Per notes patient had some anxiety and complaints of chest discomfort/shortness of breath last evening-improved with anxiolytic Tolerating full liquids Patient is currently sound asleep and did not awaken to my voice or abdominal exam    Objective   Vital signs in last 24 hours: Temp:  [97.8 F (36.6 C)-99.5 F (37.5 C)] 98.7 F (37.1 C) (08/03 1107) Pulse Rate:  [106-120] 113 (08/03 1107) Resp:  [17-24] 19 (08/03 1107) BP: (135-173)/(74-105) 166/95 (08/03 1107) SpO2:  [91 %-98 %] 94 % (08/03 1107) Weight:  [115.5 kg] 115.5 kg (08/03 0545) Last BM Date :  (PTA) General: Young white female in NAD somnolent Heart: Tachycardic 110-115 regular rate and rhythm; no murmurs Lungs: Respirations even and unlabored, decreased breath sounds left base Abdomen:  Soft, obese , bowel sounds present, remains tender upper abdomen left upper quadrant into left mid quadrant and left flank Extremities:  Without edema. Neurologic:  Alert and oriented,  grossly normal neurologically. Psych:  Cooperative. Normal mood and affect.  Intake/Output from previous day: 08/02 0701 - 08/03 0700 In: 4718.7 [P.O.:120; I.V.:4357.2; IV Piggyback:241.5] Out: -  Intake/Output this shift: No intake/output data recorded.  Lab Results: Recent Labs     06/16/24 0433 06/17/24 0404 06/18/24 0404  WBC 40.0* 30.3* 23.0*  HGB 8.4* 8.6* 8.3*  HCT 27.3* 28.2* 27.1*  PLT 496* 510* 556*   BMET Recent Labs    06/16/24 0433 06/17/24 0404 06/18/24 0404  NA 133* 136 134*  K 4.1 4.4 3.7  CL 103 106 105  CO2 19* 18* 21*  GLUCOSE 99 112* 133*  BUN 15 10 5*  CREATININE 1.22* 0.90 0.69  CALCIUM 7.9* 8.0* 8.0*   LFT No results for input(s): PROT, ALBUMIN, AST, ALT, ALKPHOS, BILITOT, BILIDIR, IBILI in the last 72 hours. PT/INR No results for input(s): LABPROT, INR in the last 72 hours.  Studies/Results: DG CHEST PORT 1 VIEW Result Date: 06/18/2024 EXAM: 1 VIEW XRAY OF THE CHEST 06/18/2024 03:05:36 AM COMPARISON: Radiograph 04/21/2024 and CT Jun 13 2024 CLINICAL HISTORY: 141880 SOB (shortness of breath) 141880. Reason for exam: shortness of breath FINDINGS: LUNGS AND PLEURA: Increased size of the now moderate left pleural effusion. Associated atelectasis or pneumonia in the left lower lobe. Patchy opacities in the right lung may be due to atelectasis or pneumonia. HEART AND MEDIASTINUM: Cardiomegaly. Pulmonary vascular congestion. IMPRESSION: 1. Moderate left pleural effusion with associated left lower lobe atelectasis or pneumonia. 2. Patchy opacities in the right lung, possibly due to atelectasis or pneumonia. 3. Cardiomegaly and pulmonary vascular congestion. Electronically signed by: Norman Gatlin MD 06/18/2024 03:11 AM EDT RP Workstation: [id number]       Assessment / Plan:    #64 38 year old female with initial episode of pancreatitis May 2025-felt to be idiopathic as she had had prior cholecystectomy.  Recent prolonged hospitalization due to complication of enlarging pseudocysts.  Patient had 2 EUS's done  here with her last hospitalization both times with aspiration of the largest pseudocyst, no evidence of abscess however had rapid reaccumulation of fluid suggestive of pancreatic duct disruption.  She was transferred  to [facility] on 06/12/2024 for consideration of pancreatic duct stent placement.  She ultimately underwent EUS there with cystogastrostomy on 06/06/2024, did not have pancreatic duct stent placed. She was discharged from there the following day and did well for a few days at home-and pain recurred more in the left upper quadrant and left lateral abdomen, she developed fever to 101.6 and was readmitted here  Met SIRS criteria and has been on IV Zosyn   This week she has developed progressive leukocytosis and increase in abdominal pain as well as general sense of fullness. Most recent CT 3 days ago showed the dominant fluid collection measuring 11.7 x 2.2 cm increased in size and extending along the left subdiaphragmatic space, there is also a 5.3 x 3.2 cm fluid collection underneath the left diaphragm not significantly changed and a 5.6 x 2 cm collection laterally in the left upper quadrant somewhat decreased.  Her leukocytosis has gradually trended down to 23,000 today  [facility] has been contacted and she is on the list for transfer possibly today for advanced biliary management of her complicated multiloculated pseudocysts with enlargement of the dominant cyst despite cystogastrostomy on 06/06/2024.  Still have concern for pancreatic duct disruption component and she is on low-dose octreotide  100 SQ twice daily  IR consulted yesterday and do not feel that she is a candidate for cutaneous drainage to high risk for complications/spillage of pseudocyst into the left lung  #2 now with increased size of left pleural effusion and patchy opacities right lung Hemodynamically stable, not requiring oxygen  #3 anemia-able at 8.3 but has had 4 g drop in hemoglobin since admission here 5 days ago, there has been some concern for bleeding/oozing into 1 or more of the pseudocyst and therefore Lovenox  on hold   Plan, as above Awaiting transfer to [facility] or advanced biliary management, possible necrosectomy, potential  pancreatic duct stent placement  Principal Problem:   Acute pancreatitis Active Problems:   Abdominal fluid collection   SIRS (systemic inflammatory response syndrome) (HCC)     LOS: 4 days   Reyanne Hussar PA-C 06/18/2024, 1:44 PM

## 2024-06-18 NOTE — Progress Notes (Signed)
 Unable to re-assess pain level due to pt d/c to [facility] .   Amado GORMAN Arabia, RN

## 2024-06-18 NOTE — Progress Notes (Signed)
 Provided IS and educated patient how to perform it correctly as well as its benefits. Pt verbalized understanding.   Amado GORMAN Arabia, RN

## 2024-06-18 NOTE — Progress Notes (Addendum)
 Overnight floor coverage  Informed by RN that patient complained of shortness of breath but not hypoxic.  Oxygen saturation in the high 90s on room air.  Continues to have upper abdominal pain related to pancreatitis and slight tachycardia but patient states the pain is not any worse than what it has been.  She is not sure if she is having chest pain.  She is receiving opiates for pain.  Cardiac monitoring ordered. EKG showing sinus tachycardia and no obvious acute ischemic changes.  Troponin pending.    Chest x-ray showing moderate left pleural effusion with associated left lower lobe atelectasis or pneumonia.  Patchy opacities in the right lung possibly due to atelectasis or pneumonia.  Cardiomegaly and pulmonary vascular congestion.    Patient is already on Zosyn  and leukocytosis has improved on labs this morning.  Check BNP.  Addendum: Also patient had requested medication for anxiety and was given p.o. Ativan  0.5 mg x 1.

## 2024-06-19 LAB — CULTURE, BLOOD (ROUTINE X 2)
Culture: NO GROWTH
Culture: NO GROWTH
Special Requests: ADEQUATE
Special Requests: ADEQUATE

## 2024-06-22 NOTE — H&P (Signed)
 [facility] PRE-SEDATION RADIOLOGY ASSESSMENT SUBJECTIVE:  Katrina Lambert is a 38 y.o. year old female requested to undergo drain placement and aspiration under moderate sedation.  Patient prior anesthesia or moderate sedation: unknown  ASA CLASS: ASA 2 - Patient with mild systemic disease with no functional limitations  ALLERGIES: No Known Allergies  MEDICATIONS: Current Facility-Administered Medications  Medication Dose Route Frequency Provider Last Rate Last Admin  . acetaminophen  (OFIRMEV ) injection 1,000 mg  1,000 mg Intravenous Q6H Burdo, Erica Annette, PA      . amino acids-protein hydrolys (PROSOURCE TF 20) 20 gram-80 kcal/60 mL 60 mL  1 packet J Tube Q24H Ezra Jeb Reusing, MD   60 mL at 06/21/24 1542  . amLODIPine  (NORVASC ) tablet 10 mg  10 mg Oral Daily Zhao, Tingrui, MD   10 mg at 06/22/24 9163  . dextrose  10 % infusion  50 mL/hr Intravenous As Directed Burdo, Erica Annette, PA      . dextrose  50% in water  solution 12.5-25 g  12.5-25 g Intravenous As Directed Zhao, Tingrui, MD      . diphenhydrAMINE  (BENADRYL ) capsule 25 mg  25 mg Oral Q6H PRN Zhao, Tingrui, MD      . Roni by provider] enoxaparin  (LOVENOX ) 40 mg/0.4 mL inj syringe 40 mg  40 mg Subcutaneous Daily Doshi, Ruchi Srivastava, MD   40 mg at 06/21/24 0856  . gabapentin (NEURONTIN) capsule 100 mg  100 mg Oral TID Lesleigh Reusing Dragon, PA   100 mg at 06/22/24 9163  . glucagon  (GLUCAGEN) injection 1 mg  1 mg Subcutaneous As Directed Zhao, Tingrui, MD      . HYDROmorphone  (DILAUDID ) PCA 25 mg/25 mL (1 mg/mL) (opioid-naive)   Intravenous Continuous Lesleigh Reusing Dragon, PA   Rate Change (High Alert Medication) at 06/22/24 [phone]  . HYDROmorphone  CLINICIAN BOLUS (DILAUDID ) 1 mg/1 mL from PCA syringe (opioid-naive) 0.4 mg  0.4 mg Intravenous As Directed Duayne Grover Kearns, MD   0.4 mg at 06/22/24 9161  . insulin  LISPRO (AdmeLOG, HumaLOG) injection correction dose 0-12 Units  0-12 Units Subcutaneous TID CC Zhao, Tingrui, MD    3 Units at 06/18/24 2023  . ketorolac  (TORADOL ) injection 7.5 mg  7.5 mg Intravenous Q6H PRN Dalton, Juliet Christine, MD   7.5 mg at 06/21/24 1804  . lidocaine  (PF) (XYLOCAINE ) 1 % injection 0.5 mL  0.5 mL Subcutaneous As Directed Zhao, Tingrui, MD      . lidocaine  (SALONPAS) 4 % patch 2 patch  2 patch Transdermal Q24H Dalton, Juliet Christine, MD   2 patch at 06/22/24 [phone]  . lisinopriL  (ZESTRIL ) tablet 10 mg  10 mg Oral Daily Doshi, Ruchi Srivastava, MD   10 mg at 06/22/24 0836  . melatonin tablet 6 mg  6 mg Oral QHS Zhao, Tingrui, MD   6 mg at 06/21/24 1949  . menthol  (HALLS COUGH) 1 lozenge  1 lozenge Buccal TID PRN Dalton, Juliet Christine, MD      . [facility] by provider] metFORMIN  (GLUCOPHAGE ) tablet 500 mg  500 mg Oral BID CC Zhao, Tingrui, MD   500 mg at 06/18/24 2027  . methocarbamoL  (ROBAXIN ) tablet 500 mg  500 mg Oral BID Lesleigh Reusing Dragon, PA   500 mg at 06/21/24 1804  . naloxone (NARCAN) injection 0.4 mg  0.4 mg Intravenous As Directed Doshi, Grover Kearns, MD      . nicotine  (NICODERM CQ ) 14 mg/24 hr 1 patch  1 patch Transdermal Daily Zhao, Tingrui, MD   1 patch at 06/21/24 0856  . ondansetron  (  PF) (ZOFRAN ) injection 4 mg  4 mg Intravenous Q8H PRN Zhao, Tingrui, MD      . piperacillin -tazobactam (ZOSYN ) 3.375 g in sodium chloride  0.9 % 100 mL IVPB  3.375 g Intravenous Q8H Zhao, Tingrui, MD 25 mL/hr at 06/22/24 0314 3.375 g at 06/22/24 0314  . sodium chloride  0.9% infusion   Intravenous Continuous PRN Doshi, Ruchi Srivastava, MD   Stopped at 06/21/24 [phone]  . tube feeding (NUTREN 1.5) liquid   Via Tube Continuous Burdo, Erica Annette, [place]   Stopped at 06/22/24 0813     PROBLEM LIST: Problem List Items Addressed This Visit     * (Principal) Pancreatitis ([facility]HS-HCC)   Relevant Medications   HYDROmorphone  CLINICIAN BOLUS (DILAUDID ) 1 mg/1 mL from PCA syringe (opioid-naive) 0.4 mg   HYDROmorphone  (DILAUDID ) PCA 25 mg/25 mL (1 mg/mL) (opioid-naive)   Other Relevant Orders   ECG  12-lead (Completed)   X-ray long GI tube placement (Completed)   CT peritoneal abscess drain   Infected pancreatic pseudocyst (HHS-HCC) - Primary   Relevant Orders   X-ray long GI tube placement (Completed)   CT chest PE protocol incl CT angiogram chest w wo contrast (Completed)   CT abdomen pelvis with contrast   Other Visit Diagnoses       Chest pain, unspecified type       Relevant Orders   ECG 12-lead (Completed)   X-ray chest single view portable   ECG 12-lead (Completed)        PAST SURGICAL HISTORY: Past Surgical History:  Procedure Laterality Date  . CHOLECYSTECTOMY  2010  . ANTERIOR / POSTERIOR COMBINED FUSION CERVICAL SPINE Right 2023  . left thigh Left 2025   left thigh debridement from a spider bite  . ESOPHAGOGASTRODOUDENOSCOPY W/DILITATION GASTRIC OUTLET N/A 06/06/2024   Procedure: Upper Endoscopic Ultrasound;  Surgeon: Elta Fonda Mt, MD;  Location: [facility];  Service: Gastroenterology;  Laterality: N/A;  . ESOPHAGOGASTRODOUDENOSCOPY W/TRANSMURAL DRAINAGE PSEUDOCYST N/A 06/06/2024   Procedure: ESOPHAGOGASTRODUODENOSCOPY, FLEXIBLE, TRANSORAL; W/ TRANSMURAL DRAINAGE OF PSEUDOCYST (INCL PLACEMENT OF TRANSMURAL DRAINAGE CATHETER/STENT, WHEN PERFORMED, AND ENDOSCOPIC ULTRASOUND, WHEN PERFORMED);  Surgeon: Elta Fonda Mt, MD;  Location: [facility];  Service: Gastroenterology;  Laterality: N/A;  . INTRALUMINAL DILATION RADIOLOGICAL SUPERVISION/INTERPRETATION  06/06/2024   Procedure: INTRALUMINAL DILATION OF STRICTURES AND/OR OBSTRUCTIONS (EG, ESOPHAGUS), RADIOLOGICAL SUPERVISION AND INTERPRETATION;  Surgeon: Elta Fonda Mt, MD;  Location: [facility];  Service: Gastroenterology;;  . CESAREAN SECTION     2007, 2009    PHYSICAL EXAM:  Vitals: BP 133/76 (BP Location: Right forearm, Patient Position: Lying)   Pulse 99   Temp 36.5 C (97.7 F) (Oral)   Resp 18   Ht 165.1 cm (5' 5)   Wt (!) 116.3 kg (256 lb 6.3 oz)   LMP   (LMP Unknown)   SpO2 95%   BMI 42.67 kg/m  Body mass index is 42.67 kg/m. General appearance: Alert, cooperative, pleasant, in no acute distress Heart: Regular rate and rhythm Lungs: Breathing comfortably on room air Abdomen: Soft, nondistended, nontender   MALLAMPATI SCORE: Class II: Soft palate, uvula, fauces visible.  LABS:  Recent Labs  Lab 06/18/24 1814  APTT 28.4  INR 1.4*   Recent Labs  Lab 06/20/24 0510 06/21/24 0651 06/22/24 0429  NA 139   < > 135  K 3.7   < > 4.5  CL 103   < > 100  CO2 25   < > 22  BUN 5*   < > 9  CREATININE  0.6   < > 0.6  GLUCOSE 102   < > 111  CALCIUM 8.7   < > 8.4*  PHOS 4.8*  --   --    < > = values in this interval not displayed.    ASSESSMENT/PLAN:  Pulmonary, cardiac, abdominal, airway, and mental status appropriate for this procedure.  Moderate sedation is appropriate for this patient at this time.       MABEL COLUMBIA, MD, PhD Integrated Interventional Radiology, PGY4

## 2024-06-22 NOTE — Procedures (Signed)
 Radiology Procedure Note  Procedure(s): CT guided drain placement  Pre--Procedure Diagnosis:  pancreatitis  Post--Procedure Diagnosis:  Same  Radiologist(s): Perkons, MD and Toya Irving, MD  Contrast: None   Medications: 2.5 mg Versed ; 125 mcg Fentanyl   Sedation Time: 18 minutes  Complications: None   Specimen collected: of purulent fluid with 73F drain . Approximately 30 mls sent for laboratory testing.  EBL: Minimal  Plan: Patient will return to the floor for care by primary team.  Full dictated report to follow.   Abdominal Intervention Pager #[phone]     Katrina COLUMBIA, MD, PhD Integrated Interventional Radiology, PGY4

## 2024-06-22 NOTE — Care Plan (Signed)
 Pt AO x 4, RA.  VSS, NAD.  Pt went to IR for CT guided drain placement. L medial abdomen JP drain placed. drained in IR. 70mL drained - Dark red, cloudy output.  Pt NPO with Nutren 1.5 TF running at 16mL/hr (goal rate). Q4 30mL flush. Denies N/V/D.  Pain in abdomen 7-10/10. PCA 1:1 Dilaudid  with 0.4 demand, 10 demands max, and 1.6mg  max.  IV Zosyn  given. WBC 16.4.  Problem: Risk for Infection: Goal: Will remain free from infection Outcome: Progressing   Problem: Intake: Goal: Nutrient Outcome: Progressing   Problem: Alteration in Hemodynamic Stability: Goal: Ability to  maintain  vital signs within normal range will improve Outcome: Progressing  Hgb 8.3, Hct 26.6%. VSS.

## 2024-06-23 NOTE — Progress Notes (Signed)
 BRIEF GI BILIARY INPATIENT CONSULT NOTE  Katrina Lambert is a 38 y.o. female that we are following for pancreatitis c/b recurrent pseudocyst s/p 2 prior EUS guided drainage (05/24/24 and 05/30/24) who presented from [facility] for further management of increasing fluid collection.  Biliary team was consulted for possible stent placement, now s/p cystgastrostomy with Axios stent on 7/22.  She is now on TF   Patient now on TF with persistent but stable to mildly improved abdominal pain.  On most recent CT scan from 8/1, fluid collections stable to slightly decreased with Axios stent in place.  Also underwent CT guided drainage with radiology on 8/7.  Will plan for repeat imaging and Axios stent removal 4-6 weeks after placement (between 8/19-9/2) - we have asked our schedulers to make an outpatient appointment for this on the week of 8/25.  If she is still inpatient at that time, please reach out to our team for inpatient removal.   Recommendations: - Supportive care for pancreatitis per primary team - Plan for repeat imaging and Axios stent removal outpatient week of 8/25 (we will arrange) - If still inpatient on week of 8/25, please reach out to biliary consult service for inpatient removal  Catherine P. Lynae, MD Gastroenterology Fellow    06/23/2024 7:04 PM  ------------------------------------------------------------------------------- Attestation signed by Jama Fonda Ruse, MD at 06/23/2024  8:34 PM ATTESTATION    (DM)  This service was rendered under my overall direction and control, and I was immediately available via phone/pager or present on site.   FONDA RUSE JAMA, MD Attending Physician Division of Gastroenterology 06/23/2024 at: 8:33 PM General GI Consult Pager: [phone] Biliary Consult Pager: [phone]  -------------------------------------------------------------------------------

## 2024-06-27 NOTE — Progress Notes (Signed)
 General Surgery Progress Note     Date of Service: 06/27/2024 Hospital Admission Date:   06/18/2024   Hosp day: 9                     Postop day:      Brief HPI: Katrina Lambert is a 38 y.o. female with a history of type 2 diabetes (A1c 5.9 05/2024), obesity BMI 42, asthma, cervical myelopathy, idiopathic pancreatitis status post EUS with Axios cystogastrostomy stent placement 7/22, now with new pancreatic pseudocysts for which surgical oncology was consulted for evaluation.   The patient tells us  she initially felt some symptomatic improvement after the [facility] stent placement but began regular diet post procedurally and within several days began to have recurrence left-sided abdominal pain and nausea.  Experienced fevers and chills thus presented to the hospital.    She had a laparoscopic cholecystectomy ~15 years ago.  She does not drink alcohol for several years.  24 hour events   NAEO. Vitals stable, afebrile. WBC 12.5 (9.5), Hgb stable. Drain output 25, amylase 515. Had 4 bowel movements. Continued left sided pain but overall well-controlled. Continues to tolerate TF at goal   Vital Signs     Current Vital Signs 24h Vital Sign Ranges  T 36.8 C (98.2 F) (06/27/24 0553) Temp  Avg: 36.7 C (98 F)  Min: 36.4 C (97.5 F)  Max: 36.8 C (98.2 F)  BP 132/61 (06/27/24 0553) BP  Min: 113/66  Max: 140/63  HR 82 (06/27/24 0553) Pulse  Avg: 82  Min: 82  Max: 82  RR 16 (06/26/24 2331) Resp  Avg: 16.5  Min: 16  Max: 17  O2sat 97 %   SpO2  Avg: 98.4 %  Min: 97 %  Max: 100 %   Intake/Output (24 hours): I/O last 2 completed shifts: In: 946 [P.O.:74; I.V.:83; Other:20; NG/GT:180] Out: 25 [Drains:25] Last Weight: (!) 116.3 kg (256 lb 6.3 oz) (06/18/24 1800)  Admit Weight: (!) 116.3 kg (256 lb 6.3 oz) (06/18/24 1800)  AFP:Anib mass index is 42.67 kg/m. Height: 165.1 cm (5' 5)    Physical Exam   General: alert, appropriate, interactive, pleasant Cardiovascular: HDS Respiratory: no increased  work of breathing on room air Abdomen: soft, not distended, improving tenderness to palpation left upper quadrant and left lower quadrant Extremities: no lower extremity edema  Data   Labs:  Recent Labs  Lab 06/24/24 0505 06/25/24 0538 06/26/24 0532  NA 136 137 136  K 4.2 3.9 4.3  CO2 24 27 25   BUN 12 15 13   CREATININE 0.7 0.7 0.7  CALCIUM 9.2 9.3 9.1  MG 2.1 2.2 2.2   Recent Labs  Lab 06/24/24 0505 06/25/24 0538 06/26/24 0532  ALT 10 9* 9*  AST 14* 14* 14*  ALKPHOS 74 72 74  TBILI 0.2* 0.4 0.3*  ALB 2.3* 2.3* 2.3*     Recent Labs  Lab 06/25/24 0538 06/26/24 0532 06/27/24 0639  WBC 11.1* 9.8 12.5*  HGB 9.1* 8.9* 8.7*  HCT 30.6* 30.0* 28.4*  PLT 813* 641* 744*   No results for input(s): APTT, PROTIME, INR in the last 168 hours.    Glucose range:  Recent Labs  Lab 06/26/24 1214 06/26/24 1751 06/26/24 2334 06/27/24 0553  POCGLU 94 105 113 109   Additional Labs: No results for input(s): COLORU, CLARITYU, SPECGRAV, LABPH, PROTEINUA, GLUCOSEU, KETONESU, BLOODU, NITRITE, LEUKOCYTESUR, BILIRUBINUR, UROBILINOGEN, RBCUA, WBCUA, SQUAMEPI, UOTHEPI, HYALINE, CASTUA, BACTERIA, UCLINITST in the last 168 hours.  Microbiology: No  results found for: BLDCULT No results found for: URCULT No results found for: CULTRESP Lab Results  Component Value Date   LABGRAM No polymorphonuclear leukocytes seen 06/22/2024   LABGRAM Rare Yeast 06/22/2024   CULTANA 2+ Candida dubliniensis (!) 06/22/2024     Pertinent Imaging:  INDICATIONS: K85.00 Idiopathic acute pancreatitis without necrosis or infection (HHS-HCC)   COMPARISONS: 06/13/2024    TECHNIQUE: - Patient full name: Katrina Lambert - Date of Original Study: 06/16/2024  - Study: CT of the abdomen and pelvis with intravenous contrast. - Study Technique: Axial images of the abdomen and pelvis with intravenous contrast. Coronal and sagittal reformatted images.   FINDINGS:    - Lower Thorax: Small left pleural effusion with dense airspace consolidation involving the left lower lobe.       - Liver: Normal in morphology and enhancement.  No suspicious hepatic masses are identified.  The portal and hepatic veins are patent.    - Biliary and Gallbladder: No intrahepatic or extrahepatic bile duct dilatation.   - Spleen: Normal in appearance.     - Pancreas: Tiny low-attenuation lesion in the pancreatic tail is decreased in size, now measuring 0.7 cm, previously 1.6 cm (series 3, image 25). No main pancreatic ductal dilatation. Persistent peripancreatic soft tissue stranding, similar to prior.    - Adrenal Glands: Normal in appearance.    - Kidneys: No suspicious renal lesions. No hydronephrosis.   - Abdominal and Pelvic Vasculature: No abdominal aortic aneurysm.   - Gastrointestinal Tract: Cyst gastrostomy tube is present. No bowel wall thickening or dilatation.   - Peritoneum/Mesentery/Retroperitoneum: Similar appearance of the left subdiaphragmatic fluid collection drained by the cyst gastrostomy tube, measuring 4.9 x 2.3 cm (series 3, image 26). Suspected extraperitoneal collection in the left hemiabdomen measures 8.9 x 1.6 cm, previously 9.2 x 1.8 cm (series 3, image 17). Fluid collection in the left ventral abdomen measures 5.5 x 2.0 cm, previously 7.2 x 2.3 cm (series 3, image 41).   - Lymph Nodes: No retroperitoneal or mesenteric lymphadenopathy.     - Bladder: Normal in appearance.   - Pelvic Organs: Intrauterine device.   - Body Wall: Unremarkable.   - Musculoskeletal:  No aggressive appearing osseous lesions. Anterolisthesis of L3 on L4.     IMPRESSION:  1.  Cyst gastrostomy tube in place with similar subdiaphragmatic fluid collection. 2.  Left abdominal fluid collections, slightly decreased from prior. 3.  Dense left lower lobe airspace consolidation concerning for pneumonia and/or aspiration.   Please note: Our interpretation  of studies performed at an outside institution is limited by factors including absence of technical specifics of the image, undisclosed clinical information and the unavailability of the original interpretation.  Specialists at the institution that performed this study may have access to information not available to us  that could make a difference in the interpretation.  We suggest that you obtain the original interpretation from the site where the study was performed.   Electronically Signed by:  Redell Dawn, MD, [facility] Electronically Signed on:  06/19/2024 9:18 AM   Medications   . amino acids-protein hydrolys  1 packet J Tube Q24H  . amLODIPine   5 mg Oral Daily  . enoxaparin   40 mg Subcutaneous Daily  . fluconazole  (DIFLUCAN ) IV  800 mg Intravenous Q24H  . gabapentin  100 mg Oral TID  . insulin  LISPRO (AdmeLOG, HumaLOG) injection  0-12 Units Subcutaneous Q6H SCH  . lidocaine   2 patch Transdermal Q24H  . lisinopriL   10 mg Oral Daily  .  melatonin  6 mg Oral QHS  . methocarbamoL   500 mg Oral BID  . nicotine   1 patch Transdermal Daily    . HYDROmorphone  PCA 1 mg/mL (opioid-naive)    . sodium chloride  10 mL/hr at 06/22/24 1040  . tube feeding 55 mL/hr at 06/27/24 0423    dextrose , dextrose  50% in water , diphenhydrAMINE , glucagon , HYDROmorphone  CLINICIAN BOLUS, lidocaine , menthol , naloxone, ondansetron , sodium chloride   Assessment & Plan   Hospital Problem List: Principal Problem:   Pancreatitis (HHS-HCC)  Katrina Lambert is a 38 y.o. female with with PMH T2DM, s/p remote cholecystectomy (2010), recurrent idiopathic pancreatitis c/b pancreatic pseudocysts s/p cystogastrostomy 2 weeks ago (06/06/24) who presents as a transfer from [facility] for management of her recurrent pancreatitis.   8/6: DHT in place; continuing nutrition and pain control today 8/7: CT guided drainage today; up to goal feed rate 8/8: following up drain cultures; otherwise, NTD 8/9: Drain cultures  with candida, started fluc 8/10: no changes 8/11: dc zosyn  8/12: drain amylase 515, plan for CT scan tomorrow   #recurrent pancreatitis with pancreatic pseudocysts  #s/p axios stent  - CT scan tomorrow  - Fluconazole  for candidal growth from drain; dc zosyn  for no bacterial growth - Continue tube feeds at goal rate today - Continue pain control optimization - Analgesia: dilaudid  PCA w/ clinician bolus, scheduled IV tylenol , lidocaine  patch - Diet: tube feeding (NUTREN 1.5) liquid amino acids-protein hydrolys (PROSOURCE TF 20) 20 gram-80 kcal/60 mL 60 mL DIET NPO Do NOT hold tube feeding Effective Now - IVF: NA - Bowel regimen: none - Antimicrobials: fluconazole  - Monitor electrolytes & replete as necessary - Encourage ambulation and IS  # Prophylaxis: - DVT: Lovenox    #T2DM  - SSI  - metformin  held   #HTN - restarted home lisinopril  and amlodipine    #tobacco use  - restarted home nicotine  patch   Code Status: Full Code VTE Prophylaxis: low molecular weight heparin , SCD's Disposition: PT Assessment: Home (06/23/24 1158) RN Assessment: No limitation (06/26/24 2000)) Discharge Planning: pending hospital course    Please do not hesitate to contact the functional pager if you have questions or concerns.  JULIET CHRISTINE DALTON, MD 06/27/2024 Blue 3 pager [phone]

## 2024-10-03 ENCOUNTER — Other Ambulatory Visit (HOSPITAL_COMMUNITY): Payer: Self-pay | Admitting: Family Medicine

## 2024-10-03 DIAGNOSIS — K8500 Idiopathic acute pancreatitis without necrosis or infection: Secondary | ICD-10-CM
